# Patient Record
Sex: Female | Born: 1940
Health system: Southern US, Community
[De-identification: ages and names within clinical notes are randomized; demographics above are authoritative.]

## PROBLEM LIST (undated history)

## (undated) DIAGNOSIS — I729 Aneurysm of unspecified site: Secondary | ICD-10-CM

## (undated) DIAGNOSIS — I10 Essential (primary) hypertension: Secondary | ICD-10-CM

## (undated) DIAGNOSIS — D216 Benign neoplasm of connective and other soft tissue of trunk, unspecified: Secondary | ICD-10-CM

## (undated) DIAGNOSIS — Z1211 Encounter for screening for malignant neoplasm of colon: Secondary | ICD-10-CM

## (undated) DIAGNOSIS — L989 Disorder of the skin and subcutaneous tissue, unspecified: Secondary | ICD-10-CM

## (undated) DIAGNOSIS — J449 Chronic obstructive pulmonary disease, unspecified: Secondary | ICD-10-CM

## (undated) DIAGNOSIS — C801 Malignant (primary) neoplasm, unspecified: Secondary | ICD-10-CM

## (undated) DIAGNOSIS — E119 Type 2 diabetes mellitus without complications: Secondary | ICD-10-CM

## (undated) DIAGNOSIS — M1990 Unspecified osteoarthritis, unspecified site: Secondary | ICD-10-CM

## (undated) DIAGNOSIS — C50419 Malignant neoplasm of upper-outer quadrant of unspecified female breast: Secondary | ICD-10-CM

## (undated) DIAGNOSIS — Z860100 Personal history of colon polyps, unspecified: Secondary | ICD-10-CM

## (undated) DIAGNOSIS — E785 Hyperlipidemia, unspecified: Secondary | ICD-10-CM

## (undated) DIAGNOSIS — Z87891 Personal history of nicotine dependence: Secondary | ICD-10-CM

## (undated) HISTORY — DX: Disorder of the skin and subcutaneous tissue, unspecified: L98.9

## (undated) HISTORY — DX: Benign neoplasm of connective and other soft tissue of trunk, unspecified: D21.6

## (undated) HISTORY — DX: Aneurysm of unspecified site: I72.9

## (undated) HISTORY — DX: Personal history of nicotine dependence: Z87.891

## (undated) HISTORY — DX: Essential (primary) hypertension: I10

## (undated) HISTORY — DX: Personal history of colon polyps, unspecified: Z86.0100

## (undated) HISTORY — DX: Encounter for screening for malignant neoplasm of colon: Z12.11

## (undated) HISTORY — DX: Personal history of colonic polyps: Z86.010

## (undated) HISTORY — DX: Unspecified osteoarthritis, unspecified site: M19.90

## (undated) HISTORY — DX: Type 2 diabetes mellitus without complications: E11.9

## (undated) HISTORY — PX: EYE SURGERY: SHX253

## (undated) HISTORY — DX: Malignant neoplasm of upper-outer quadrant of unspecified female breast: C50.419

## (undated) HISTORY — DX: Hyperlipidemia, unspecified: E78.5

## (undated) HISTORY — DX: Malignant (primary) neoplasm, unspecified: C80.1

## (undated) MED FILL — Dexamethasone Sodium Phosphate Inj 100 MG/10ML: INTRAMUSCULAR | Qty: 1 | Status: AC

---

## 1989-12-25 HISTORY — PX: OTHER SURGICAL HISTORY: SHX169

## 2002-12-25 HISTORY — PX: ABDOMINAL HYSTERECTOMY: SHX81

## 2005-12-25 DIAGNOSIS — C801 Malignant (primary) neoplasm, unspecified: Secondary | ICD-10-CM

## 2005-12-25 DIAGNOSIS — I729 Aneurysm of unspecified site: Secondary | ICD-10-CM

## 2005-12-25 HISTORY — DX: Aneurysm of unspecified site: I72.9

## 2005-12-25 HISTORY — PX: MASTECTOMY: SHX3

## 2005-12-25 HISTORY — PX: PORTACATH PLACEMENT: SHX2246

## 2005-12-25 HISTORY — PX: CHOLECYSTECTOMY: SHX55

## 2005-12-25 HISTORY — DX: Malignant (primary) neoplasm, unspecified: C80.1

## 2005-12-25 HISTORY — PX: BREAST SURGERY: SHX581

## 2005-12-27 ENCOUNTER — Ambulatory Visit: Payer: Self-pay

## 2005-12-29 ENCOUNTER — Ambulatory Visit: Payer: Self-pay | Admitting: General Surgery

## 2006-01-09 ENCOUNTER — Other Ambulatory Visit: Payer: Self-pay

## 2006-01-11 ENCOUNTER — Ambulatory Visit: Payer: Self-pay | Admitting: General Surgery

## 2006-01-24 ENCOUNTER — Ambulatory Visit: Payer: Self-pay | Admitting: Internal Medicine

## 2006-01-29 ENCOUNTER — Ambulatory Visit: Payer: Self-pay | Admitting: Internal Medicine

## 2006-01-31 ENCOUNTER — Ambulatory Visit: Payer: Self-pay | Admitting: General Surgery

## 2006-02-07 ENCOUNTER — Ambulatory Visit: Payer: Self-pay | Admitting: Internal Medicine

## 2006-02-16 ENCOUNTER — Other Ambulatory Visit: Payer: Self-pay

## 2006-02-16 ENCOUNTER — Inpatient Hospital Stay: Payer: Self-pay | Admitting: General Surgery

## 2006-02-21 ENCOUNTER — Ambulatory Visit: Payer: Self-pay | Admitting: General Surgery

## 2006-02-22 ENCOUNTER — Ambulatory Visit: Payer: Self-pay | Admitting: Internal Medicine

## 2006-03-25 ENCOUNTER — Ambulatory Visit: Payer: Self-pay | Admitting: Internal Medicine

## 2006-04-24 ENCOUNTER — Ambulatory Visit: Payer: Self-pay | Admitting: Internal Medicine

## 2006-05-25 ENCOUNTER — Ambulatory Visit: Payer: Self-pay | Admitting: Internal Medicine

## 2006-06-24 ENCOUNTER — Ambulatory Visit: Payer: Self-pay | Admitting: Internal Medicine

## 2006-07-25 ENCOUNTER — Ambulatory Visit: Payer: Self-pay | Admitting: Internal Medicine

## 2006-08-25 ENCOUNTER — Ambulatory Visit: Payer: Self-pay | Admitting: Internal Medicine

## 2006-09-24 ENCOUNTER — Ambulatory Visit: Payer: Self-pay | Admitting: Internal Medicine

## 2006-10-25 ENCOUNTER — Ambulatory Visit: Payer: Self-pay | Admitting: Internal Medicine

## 2006-11-24 ENCOUNTER — Ambulatory Visit: Payer: Self-pay | Admitting: Internal Medicine

## 2006-12-25 HISTORY — PX: PORT-A-CATH REMOVAL: SHX5289

## 2006-12-27 ENCOUNTER — Ambulatory Visit: Payer: Self-pay | Admitting: Internal Medicine

## 2007-01-02 ENCOUNTER — Ambulatory Visit: Payer: Self-pay | Admitting: General Surgery

## 2007-01-25 ENCOUNTER — Ambulatory Visit: Payer: Self-pay | Admitting: Internal Medicine

## 2007-03-21 ENCOUNTER — Ambulatory Visit: Payer: Self-pay | Admitting: Internal Medicine

## 2007-03-26 ENCOUNTER — Ambulatory Visit: Payer: Self-pay | Admitting: Internal Medicine

## 2007-04-25 ENCOUNTER — Ambulatory Visit: Payer: Self-pay | Admitting: Internal Medicine

## 2007-05-26 ENCOUNTER — Ambulatory Visit: Payer: Self-pay | Admitting: Internal Medicine

## 2007-06-25 ENCOUNTER — Ambulatory Visit: Payer: Self-pay | Admitting: Internal Medicine

## 2007-07-26 ENCOUNTER — Ambulatory Visit: Payer: Self-pay | Admitting: Internal Medicine

## 2007-08-26 ENCOUNTER — Ambulatory Visit: Payer: Self-pay | Admitting: Internal Medicine

## 2007-09-25 ENCOUNTER — Ambulatory Visit: Payer: Self-pay | Admitting: Internal Medicine

## 2007-12-26 ENCOUNTER — Ambulatory Visit: Payer: Self-pay | Admitting: Internal Medicine

## 2008-01-01 ENCOUNTER — Ambulatory Visit: Payer: Self-pay | Admitting: Internal Medicine

## 2008-01-10 ENCOUNTER — Ambulatory Visit: Payer: Self-pay | Admitting: General Surgery

## 2008-01-15 ENCOUNTER — Ambulatory Visit: Payer: Self-pay | Admitting: Internal Medicine

## 2008-01-26 ENCOUNTER — Ambulatory Visit: Payer: Self-pay | Admitting: Internal Medicine

## 2008-04-24 ENCOUNTER — Ambulatory Visit: Payer: Self-pay | Admitting: Internal Medicine

## 2008-05-25 ENCOUNTER — Ambulatory Visit: Payer: Self-pay | Admitting: Internal Medicine

## 2008-12-30 ENCOUNTER — Ambulatory Visit: Payer: Self-pay | Admitting: General Surgery

## 2009-12-25 HISTORY — PX: COLONOSCOPY W/ POLYPECTOMY: SHX1380

## 2010-01-18 ENCOUNTER — Ambulatory Visit: Payer: Self-pay | Admitting: General Surgery

## 2010-02-25 ENCOUNTER — Ambulatory Visit: Payer: Self-pay | Admitting: General Surgery

## 2011-02-07 ENCOUNTER — Ambulatory Visit: Payer: Self-pay | Admitting: General Surgery

## 2012-02-13 ENCOUNTER — Ambulatory Visit: Payer: Self-pay | Admitting: General Surgery

## 2012-05-13 ENCOUNTER — Ambulatory Visit: Payer: Self-pay | Admitting: Internal Medicine

## 2012-12-25 DIAGNOSIS — D216 Benign neoplasm of connective and other soft tissue of trunk, unspecified: Secondary | ICD-10-CM

## 2012-12-25 HISTORY — DX: Benign neoplasm of connective and other soft tissue of trunk, unspecified: D21.6

## 2013-02-13 ENCOUNTER — Ambulatory Visit: Payer: Self-pay | Admitting: General Surgery

## 2013-02-19 DIAGNOSIS — L989 Disorder of the skin and subcutaneous tissue, unspecified: Secondary | ICD-10-CM

## 2013-02-19 HISTORY — DX: Disorder of the skin and subcutaneous tissue, unspecified: L98.9

## 2013-06-25 ENCOUNTER — Encounter: Payer: Self-pay | Admitting: *Deleted

## 2013-06-25 DIAGNOSIS — L989 Disorder of the skin and subcutaneous tissue, unspecified: Secondary | ICD-10-CM | POA: Insufficient documentation

## 2013-06-25 DIAGNOSIS — D216 Benign neoplasm of connective and other soft tissue of trunk, unspecified: Secondary | ICD-10-CM | POA: Insufficient documentation

## 2013-06-25 DIAGNOSIS — C801 Malignant (primary) neoplasm, unspecified: Secondary | ICD-10-CM | POA: Insufficient documentation

## 2013-10-17 DIAGNOSIS — I10 Essential (primary) hypertension: Secondary | ICD-10-CM | POA: Insufficient documentation

## 2013-12-02 DIAGNOSIS — F329 Major depressive disorder, single episode, unspecified: Secondary | ICD-10-CM | POA: Insufficient documentation

## 2013-12-25 DIAGNOSIS — C50419 Malignant neoplasm of upper-outer quadrant of unspecified female breast: Secondary | ICD-10-CM

## 2013-12-25 HISTORY — DX: Malignant neoplasm of upper-outer quadrant of unspecified female breast: C50.419

## 2014-01-01 ENCOUNTER — Ambulatory Visit (INDEPENDENT_AMBULATORY_CARE_PROVIDER_SITE_OTHER): Payer: Medicare Other | Admitting: General Surgery

## 2014-01-01 ENCOUNTER — Encounter: Payer: Self-pay | Admitting: General Surgery

## 2014-01-01 ENCOUNTER — Other Ambulatory Visit: Payer: Medicare Other

## 2014-01-01 VITALS — BP 124/68 | HR 70 | Resp 14 | Ht 66.0 in | Wt 137.0 lb

## 2014-01-01 DIAGNOSIS — R222 Localized swelling, mass and lump, trunk: Secondary | ICD-10-CM

## 2014-01-01 DIAGNOSIS — R634 Abnormal weight loss: Secondary | ICD-10-CM

## 2014-01-01 NOTE — Patient Instructions (Signed)

## 2014-01-01 NOTE — Progress Notes (Signed)
Patient ID: Jessica Compton, female   DOB: Jan 07, 1941, 73 y.o.   MRN: [id number]  Chief Complaint  Patient presents with  . Other    left mastectomy site    HPI Jessica Compton is a 73 y.o. female here today for a evaluation of a lump. Patient states she felt a lump at her left mastectomy site last week. She states there is no pain at this area.  She states she just wants to make sure everything is okay. Patient left mastectomy was done back in 2007. Patient daughter states she her mother last been loosing weight in the last four months.   HPI  Past Medical History  Diagnosis Date  . Cancer 2007    diagnosed 01/07 left breast with simple mastectomu & sn bx. The pt had a 3.5cm tumor. Frozen section report on the 2 sn were negative. On permanent sections a 0.30mm micrometastasis was identified. She was not felt to require a complete axillary dissection. She has completed her Tamoxifen therapy and has been released from routine f/u with medical oncology service.  . Diabetes mellitus without complication   . Hypertension     since age 54  . Personal history of tobacco use, presenting hazards to health   . Arthritis   . Other benign neoplasm of connective and other soft tissue of trunk, unspecified 2014  . Special screening for malignant neoplasms, colon   . Aneurysm 2007  . Hyperlipidemia   . Unspecified disorder of skin and subcutaneous tissue 02/19/2013    biopsy of skin over the sternum on the right breast showed hypertrophic scar,keloid formation.  . Malignant neoplasm of upper-outer quadrant of female breast   . Personal history of colonic polyps     Past Surgical History  Procedure Laterality Date  . Colonoscopy w/ polypectomy  2011    Dr. Brynda Greathouse  . Abdominal hysterectomy  2004    total  . Portacath placement  2007  . Port-a-cath removal  2008  . Cholecystectomy  2007  . Mastectomy Left 2007  . Head surgery  1991  . Eye surgery Right     cataract surgery  . Breast surgery  Left 2007    mastectomy    Family History  Problem Relation Age of Onset  . Cancer Other     ovarian,breast,colon cancers;relations not listed    Social History History  Substance Use Topics  . Smoking status: Current Some Day Smoker -- 1.00 packs/day for 50 years    Types: Cigarettes  . Smokeless tobacco: Never Used  . Alcohol Use: No    No Known Allergies  Current Outpatient Prescriptions  Medication Sig Dispense Refill  . citalopram (CELEXA) 10 MG tablet Take 1 tablet by mouth daily.      . hydrochlorothiazide (MICROZIDE) 12.5 MG capsule Take 1 capsule by mouth daily.      Marland Kitchen lisinopril (PRINIVIL,ZESTRIL) 5 MG tablet Take 1 tablet by mouth daily.       No current facility-administered medications for this visit.    Review of Systems Review of Systems  Constitutional: Positive for appetite change and unexpected weight change. Negative for fever, chills, diaphoresis, activity change and fatigue.  Respiratory: Negative.   Cardiovascular: Negative.   The patient denies postprandial pain or difficulty with swallowing. She reports her appetite is just not what once was. If she is cooked a meal she has no interest in eating, and even when visiting with family she'll eat a few bites and then be satisfied.  Blood pressure 124/68, pulse 70, resp. rate 14, height 5\' 6"  (1.676 m), weight 137 lb (62.143 kg). The patient's weight is down 14 pounds from her February 2014 exam.  Physical Exam Physical Exam  Constitutional: She is oriented to person, place, and time. She appears well-developed and well-nourished.  Eyes: Conjunctivae are normal. No scleral icterus.  Neck: Neck supple.  Cardiovascular: Normal rate, regular rhythm and normal heart sounds.   Pulmonary/Chest: Breath sounds normal.  Left mastectomy site shows a well healed incision. Hypertrophic scaring at port site on the right medial chest wall.  Left chest wall mass: 7 cm below the clavicle in the midclavicular line  measuring 1.5 cm.  Lymphadenopathy:       Left cervical: No superficial cervical adenopathy present.    She has no axillary adenopathy.  Neurological: She is alert and oriented to person, place, and time.  Skin: Skin is warm and dry.    Data Reviewed Ultrasound examination of the mass below the clavicle on the left chest wall shows a 1.1 x 1.4 x 1.7 cm hypoechoic mass with focal acoustic shadowing beginning at the dermis and extending down to the pectoralis fascia. Increased vascular flow is appreciated on duplex imaging.  The patient was amenable to core biopsy. The area was cleansed with alcohol and 10 cc of 0.5% Xylocaine with 0.25% Marcaine with 1-200,000 units of epinephrine was utilized a well tolerated. Prep was applied to the skin. Multiple core samples were obtained from 2 locations making use of a 14-gauge Finesse device. The skin defect was closed with benzoin and Steri-Strips followed by Telfa and Tegaderm dressing.  Assessment    Left chest wall mass, suspicious for local recurrence of her previous breast malignancy. Weight loss, possible cachexia of malignancy    Plan    The patient and her daughter will be contacted by phone when pathology is available.       [name] 01/02/2014, 1:55 PM

## 2014-01-02 DIAGNOSIS — R222 Localized swelling, mass and lump, trunk: Secondary | ICD-10-CM | POA: Insufficient documentation

## 2014-01-02 DIAGNOSIS — R634 Abnormal weight loss: Secondary | ICD-10-CM | POA: Insufficient documentation

## 2014-01-05 ENCOUNTER — Telehealth: Payer: Self-pay | Admitting: General Surgery

## 2014-01-05 ENCOUNTER — Telehealth: Payer: Self-pay | Admitting: *Deleted

## 2014-01-05 ENCOUNTER — Ambulatory Visit: Payer: Self-pay | Admitting: Internal Medicine

## 2014-01-05 DIAGNOSIS — Z853 Personal history of malignant neoplasm of breast: Secondary | ICD-10-CM

## 2014-01-05 DIAGNOSIS — C50919 Malignant neoplasm of unspecified site of unspecified female breast: Secondary | ICD-10-CM

## 2014-01-05 NOTE — Telephone Encounter (Signed)
With both the patient and her daughter [name] to inform them that the biopsy completed on 01/01/2014 did show evidence of breast cancer.  Will make arrangements for a PET scan and laboratory studies, and she will be added to the [facility] tumor board scheduled for January 15.

## 2014-01-05 NOTE — Telephone Encounter (Signed)
Message copied by Dominga Ferry on Mon Jan 05, 2014  4:23 PM ------      Message from: [place], [place] W      Created: Mon Jan 05, 2014  2:49 PM       Please arrange for a PET/CT (DX: Recurrent breast cancer), CBC, MEt C, CEA and Ca 27-29.            Patient needs an appt w/ Dr. Ma Hillock for the week of Jan 19th. Thanks. ------

## 2014-01-05 NOTE — Telephone Encounter (Signed)
Patient has been scheduled for a PET scan at [facility] for 01-08-14 at 12:30 pm (arrive 11:45 am). Daughter is aware of date, time, and prep instructions.  This patient has also been asked to have labs drawn while she is at [facility] for PET scan. This is to minimize an extra trip since she will already be there. Order sent through Order Facilitator. Daughter aware of instructions.  Patient has been scheduled for an appointment with Dr. Ma Hillock at the [facility] for 01-14-14 at 2:45 pm (she will not need to arrive any earlier per Tia). Daughter aware.  Patient's daughter, Helene Kelp, to call the office if she has further questions.

## 2014-01-08 ENCOUNTER — Ambulatory Visit: Payer: Self-pay | Admitting: General Surgery

## 2014-01-08 LAB — COMPREHENSIVE METABOLIC PANEL
ALBUMIN: 4 g/dL (ref 3.4–5.0)
ALT: 19 U/L (ref 12–78)
AST: 28 U/L (ref 15–37)
Alkaline Phosphatase: 61 U/L
Anion Gap: 0 — ABNORMAL LOW (ref 7–16)
BILIRUBIN TOTAL: 0.5 mg/dL (ref 0.2–1.0)
BUN: 15 mg/dL (ref 7–18)
CALCIUM: 9.6 mg/dL (ref 8.5–10.1)
CO2: 29 mmol/L (ref 21–32)
Chloride: 103 mmol/L (ref 98–107)
Creatinine: 1.12 mg/dL (ref 0.60–1.30)
EGFR (Non-African Amer.): 49 — ABNORMAL LOW
GFR CALC AF AMER: 57 — AB
Glucose: 89 mg/dL (ref 65–99)
Osmolality: 265 (ref 275–301)
Potassium: 3.5 mmol/L (ref 3.5–5.1)
Sodium: 132 mmol/L — ABNORMAL LOW (ref 136–145)
Total Protein: 8.3 g/dL — ABNORMAL HIGH (ref 6.4–8.2)

## 2014-01-08 LAB — CBC WITH DIFFERENTIAL/PLATELET
BASOS ABS: 0 10*3/uL (ref 0.0–0.1)
Basophil %: 0.4 %
EOS ABS: 0.1 10*3/uL (ref 0.0–0.7)
Eosinophil %: 1.3 %
HCT: 38.5 % (ref 35.0–47.0)
HGB: 12.7 g/dL (ref 12.0–16.0)
LYMPHS ABS: 3.2 10*3/uL (ref 1.0–3.6)
Lymphocyte %: 34.5 %
MCH: 28.4 pg (ref 26.0–34.0)
MCHC: 33 g/dL (ref 32.0–36.0)
MCV: 86 fL (ref 80–100)
Monocyte #: 0.8 x10 3/mm (ref 0.2–0.9)
Monocyte %: 8.1 %
NEUTROS ABS: 5.2 10*3/uL (ref 1.4–6.5)
Neutrophil %: 55.7 %
Platelet: 237 10*3/uL (ref 150–440)
RBC: 4.47 10*6/uL (ref 3.80–5.20)
RDW: 14.1 % (ref 11.5–14.5)
WBC: 9.3 10*3/uL (ref 3.6–11.0)

## 2014-01-09 ENCOUNTER — Encounter: Payer: Self-pay | Admitting: General Surgery

## 2014-01-09 LAB — PATHOLOGY

## 2014-01-09 LAB — CANCER ANTIGEN 27.29: CA 27.29: 14.3 U/mL (ref 0.0–38.6)

## 2014-01-09 LAB — CEA: CEA: 5.3 ng/mL — ABNORMAL HIGH (ref 0.0–4.7)

## 2014-01-13 ENCOUNTER — Telehealth: Payer: Self-pay | Admitting: General Surgery

## 2014-01-13 NOTE — Telephone Encounter (Signed)
The patient was notified that her CA 27-29 was normal.  I spoke with the radiologist regarding the diffuse increased uptake in the right breast tissue. He did not think this represented a pathologic process.  The left chest wall mass was brightly intense consistent with the biopsy-proven malignancy.  The patient is scheduled for followup on January 21 with Leia Alf, M.D. She's likely candidate for resection of this area on the left chest wall followed by radiation therapy.  Follow up will be completed after her upcoming medical oncology assessment.

## 2014-01-14 ENCOUNTER — Telehealth: Payer: Self-pay | Admitting: *Deleted

## 2014-01-14 ENCOUNTER — Ambulatory Visit: Payer: Self-pay | Admitting: Internal Medicine

## 2014-01-14 ENCOUNTER — Encounter: Payer: Self-pay | Admitting: General Surgery

## 2014-01-14 NOTE — Telephone Encounter (Signed)
Per Judeen Hammans at the [facility], Dr. Ma Hillock agrees that patient needs to have surgery first and then see Dr. Baruch Gouty after (patient would see Dr. Ma Hillock and Dr. Baruch Gouty at the same time). Patient would need 6 weeks to allow for healing. All of this is based upon assuming treatment plan will not change unless pathology from upcoming surgery determines differently.

## 2014-01-15 ENCOUNTER — Encounter: Payer: Self-pay | Admitting: General Surgery

## 2014-01-15 ENCOUNTER — Telehealth: Payer: Self-pay | Admitting: *Deleted

## 2014-01-15 NOTE — Telephone Encounter (Signed)
Patient's surgery has been scheduled for 01-26-14 at [facility]. This patient will come in for a pre-op visit on 01-19-14. Paperwork will be reviewed with patient at that time.

## 2014-01-19 ENCOUNTER — Encounter: Payer: Self-pay | Admitting: General Surgery

## 2014-01-19 ENCOUNTER — Ambulatory Visit (INDEPENDENT_AMBULATORY_CARE_PROVIDER_SITE_OTHER): Payer: Medicare Other | Admitting: General Surgery

## 2014-01-19 ENCOUNTER — Other Ambulatory Visit: Payer: Self-pay | Admitting: General Surgery

## 2014-01-19 VITALS — BP 130/80 | HR 68 | Resp 12 | Ht 64.0 in | Wt 136.0 lb

## 2014-01-19 DIAGNOSIS — C50919 Malignant neoplasm of unspecified site of unspecified female breast: Secondary | ICD-10-CM

## 2014-01-19 DIAGNOSIS — R948 Abnormal results of function studies of other organs and systems: Secondary | ICD-10-CM

## 2014-01-19 DIAGNOSIS — R222 Localized swelling, mass and lump, trunk: Secondary | ICD-10-CM

## 2014-01-19 DIAGNOSIS — Z853 Personal history of malignant neoplasm of breast: Secondary | ICD-10-CM

## 2014-01-19 NOTE — Progress Notes (Signed)
Patient ID: Jessica Compton, female   DOB: 12-Oct-1941, 73 y.o.   MRN: [id number]  Chief Complaint  Patient presents with  . Pre-op Exam    wide excision and right breast chest wall mass    HPI Jessica Compton is a 73 y.o. female here today for her pre op wide excision left chest and right breast core biopsy.Patient states she is doing well. The patient is accompanied by her daughters. HPI  Past Medical History  Diagnosis Date  . Cancer 2007    diagnosed 01/07 left breast with simple mastectomu & sn bx. The pt had a 3.5cm tumor. Frozen section report on the 2 sn were negative. On permanent sections a 0.63mm micrometastasis was identified. She was not felt to require a complete axillary dissection. She has completed her Tamoxifen therapy and has been released from routine f/u with medical oncology service.  . Diabetes mellitus without complication   . Hypertension     since age 56  . Personal history of tobacco use, presenting hazards to health   . Arthritis   . Other benign neoplasm of connective and other soft tissue of trunk, unspecified 2014  . Special screening for malignant neoplasms, colon   . Aneurysm 2007  . Hyperlipidemia   . Unspecified disorder of skin and subcutaneous tissue 02/19/2013    biopsy of skin over the sternum on the right breast showed hypertrophic scar,keloid formation.  . Malignant neoplasm of upper-outer quadrant of female breast   . Personal history of colonic polyps     Past Surgical History  Procedure Laterality Date  . Colonoscopy w/ polypectomy  2011    Dr. Brynda Greathouse  . Abdominal hysterectomy  2004    total  . Portacath placement  2007  . Port-a-cath removal  2008  . Cholecystectomy  2007  . Mastectomy Left 2007  . Head surgery  1991  . Eye surgery Right     cataract surgery  . Breast surgery Left 2007    mastectomy    Family History  Problem Relation Age of Onset  . Cancer Other     ovarian,breast,colon cancers;relations not listed    Social  History History  Substance Use Topics  . Smoking status: Current Some Day Smoker -- 1.00 packs/day for 50 years    Types: Cigarettes  . Smokeless tobacco: Never Used  . Alcohol Use: No    No Known Allergies  Current Outpatient Prescriptions  Medication Sig Dispense Refill  . citalopram (CELEXA) 10 MG tablet Take 1 tablet by mouth daily.      . hydrochlorothiazide (MICROZIDE) 12.5 MG capsule Take 1 capsule by mouth daily.      Marland Kitchen lisinopril (PRINIVIL,ZESTRIL) 5 MG tablet Take 1 tablet by mouth daily.       No current facility-administered medications for this visit.    Review of Systems Review of Systems  Constitutional: Negative.   Respiratory: Negative.   Cardiovascular: Negative.     Blood pressure 130/80, pulse 68, resp. rate 12, height 5\' 4"  (1.626 m), weight 136 lb (61.689 kg).  Physical Exam Physical Exam  Nursing note reviewed. Constitutional: She is oriented to person, place, and time. She appears well-developed and well-nourished.  Eyes: Conjunctivae are normal.  Neck: Neck supple.  Cardiovascular: Normal rate, regular rhythm and normal heart sounds.   Pulmonary/Chest: Effort normal and breath sounds normal. Right breast exhibits no inverted nipple, no mass, no nipple discharge, no skin change and no tenderness. Left breast exhibits no inverted nipple, no  mass, no nipple discharge, no skin change and no tenderness.  Left chest wall shows a 3 cm mass.  Lymphadenopathy:    She has no cervical adenopathy.    She has no axillary adenopathy.  Neurological: She is alert and oriented to person, place, and time.  Skin: Skin is warm and dry.    Data Reviewed PET scan showed diffuse uptake throughout the right breast as well as a hypermetabolic area at the site of note recurrence the left chest wall. The images were discussed with the attending radiologist who attributed the deep use right breast uptake 2 physiologic activity.  Mammograms from 2014 had previously been  reviewed and showed no discernible abnormality.  Assessment    Chest wall recurrence, left. Abnormal uptake on PET scan of the right breast.     Plan    Plans are for excision of the left chest wall recurrence as well as core biopsy of the right breast to confirm no secondary malignancy. The patient is aware that postoperative radiation therapy is planned.     Patient's surgery has been scheduled for 01-26-13 at [facility].    [name] 01/19/2014, 7:23 PM

## 2014-01-19 NOTE — Patient Instructions (Signed)
Patient's surgery has been scheduled for 01-26-13 at [facility].

## 2014-01-20 ENCOUNTER — Ambulatory Visit: Payer: Self-pay | Admitting: General Surgery

## 2014-01-25 ENCOUNTER — Ambulatory Visit: Payer: Self-pay | Admitting: Internal Medicine

## 2014-01-26 ENCOUNTER — Ambulatory Visit: Payer: Self-pay | Admitting: General Surgery

## 2014-01-26 ENCOUNTER — Encounter: Payer: Self-pay | Admitting: General Surgery

## 2014-01-26 DIAGNOSIS — Z853 Personal history of malignant neoplasm of breast: Secondary | ICD-10-CM

## 2014-01-26 DIAGNOSIS — N63 Unspecified lump in unspecified breast: Secondary | ICD-10-CM

## 2014-01-26 DIAGNOSIS — C7981 Secondary malignant neoplasm of breast: Secondary | ICD-10-CM

## 2014-01-26 HISTORY — PX: BREAST BIOPSY: SHX20

## 2014-01-26 HISTORY — PX: OTHER SURGICAL HISTORY: SHX169

## 2014-01-28 ENCOUNTER — Telehealth: Payer: Self-pay | Admitting: *Deleted

## 2014-01-28 ENCOUNTER — Encounter: Payer: Self-pay | Admitting: General Surgery

## 2014-01-28 LAB — PATHOLOGY REPORT

## 2014-01-28 NOTE — Telephone Encounter (Signed)
Message copied by Carson Myrtle on Wed Jan 28, 2014  8:13 AM ------      Message from: [place], [place] Gleason      Created: Wed Jan 28, 2014  6:27 AM       Please notify the patient right breast biopsy was fine.  Left chest mass completely removed. F/U as scheduled.  ------

## 2014-01-28 NOTE — Telephone Encounter (Signed)
Notified patient as instructed, patient pleased. Discussed follow-up appointments, patient agrees  

## 2014-01-29 ENCOUNTER — Encounter: Payer: Self-pay | Admitting: General Surgery

## 2014-02-04 ENCOUNTER — Ambulatory Visit (INDEPENDENT_AMBULATORY_CARE_PROVIDER_SITE_OTHER): Payer: Medicare Other | Admitting: General Surgery

## 2014-02-04 ENCOUNTER — Encounter: Payer: Self-pay | Admitting: General Surgery

## 2014-02-04 VITALS — BP 142/80 | HR 78 | Resp 12 | Ht 66.0 in | Wt 137.0 lb

## 2014-02-04 DIAGNOSIS — C50919 Malignant neoplasm of unspecified site of unspecified female breast: Secondary | ICD-10-CM

## 2014-02-04 DIAGNOSIS — C792 Secondary malignant neoplasm of skin: Secondary | ICD-10-CM

## 2014-02-04 DIAGNOSIS — R222 Localized swelling, mass and lump, trunk: Secondary | ICD-10-CM

## 2014-02-04 NOTE — Patient Instructions (Addendum)
Patient to return in two month.  

## 2014-02-04 NOTE — Progress Notes (Signed)
Patient ID: Jessica Compton, female   DOB: [birth year], 73 y.o.   MRN: [id number]  Chief Complaint  Patient presents with  . Routine Post Op    left chest wall mass removal and right breast biopsy    HPI Jessica Compton is a 73 y.o. female. here today for hewr post op left chest wall mass removal and right breast biopsy 23/2/15. The patient underwent resection of the chest wall recurrence to negative margins as well as core biopsy of the contralateral breast due to increased uptake on PET scanning.    HPI  Past Medical History  Diagnosis Date  . Cancer 2007    diagnosed 01/07 left breast with simple mastectomu & sn bx. The pt had a 3.5cm tumor. Frozen section report on the 2 sn were negative. On permanent sections a 0.61mm micrometastasis was identified. She was not felt to require a complete axillary dissection. She has completed her Tamoxifen therapy and has been released from routine f/u with medical oncology service.  . Diabetes mellitus without complication   . Hypertension     since age 36  . Personal history of tobacco use, presenting hazards to health   . Arthritis   . Other benign neoplasm of connective and other soft tissue of trunk, unspecified 2014  . Special screening for malignant neoplasms, colon   . Aneurysm 2007  . Hyperlipidemia   . Unspecified disorder of skin and subcutaneous tissue 02/19/2013    biopsy of skin over the sternum on the right breast showed hypertrophic scar,keloid formation.  . Malignant neoplasm of upper-outer quadrant of female breast   . Personal history of colonic polyps     Past Surgical History  Procedure Laterality Date  . Colonoscopy w/ polypectomy  2011    Dr. Brynda Greathouse  . Abdominal hysterectomy  2004    total  . Portacath placement  2007  . Port-a-cath removal  2008  . Cholecystectomy  2007  . Mastectomy Left 2007  . Head surgery  1991  . Eye surgery Right     cataract surgery  . Breast surgery Left 2007    mastectomy  . Breast biopsy  Right     2015  . Chest wall mass  01/26/14    Family History  Problem Relation Age of Onset  . Cancer Other     ovarian,breast,colon cancers;relations not listed    Social History History  Substance Use Topics  . Smoking status: Current Some Day Smoker -- 1.00 packs/day for 50 years    Types: Cigarettes  . Smokeless tobacco: Never Used  . Alcohol Use: No    No Known Allergies  Current Outpatient Prescriptions  Medication Sig Dispense Refill  . citalopram (CELEXA) 10 MG tablet Take 1 tablet by mouth daily.      . hydrochlorothiazide (MICROZIDE) 12.5 MG capsule Take 1 capsule by mouth daily.      Marland Kitchen lisinopril (PRINIVIL,ZESTRIL) 5 MG tablet Take 1 tablet by mouth daily.       No current facility-administered medications for this visit.    Review of Systems Review of Systems  Constitutional: Negative.   Respiratory: Negative.   Cardiovascular: Negative.     Blood pressure 142/80, pulse 78, resp. rate 12, height 5\' 6"  (1.676 m), weight 137 lb (62.143 kg).  Physical Exam Physical Exam  Constitutional: She is oriented to person, place, and time. She appears well-developed and well-nourished.  Eyes: Conjunctivae are normal.  Neck: Neck supple.  Cardiovascular: Normal rate, regular rhythm and  normal heart sounds.   Pulmonary/Chest: Breath sounds normal.    Right breast biopsy looks clean and well healed.   The excision site in the left superior chest wall has healed nicely without evidence of fluid accumulation or erythema.  Neurological: She is alert and oriented to person, place, and time.  Skin: Skin is warm and dry.    Data Reviewed Right breast biopsy showed ductal hyperplasia without atypia or malignancy.  Left chest wall mass excision showed invasive mammary carcinoma with negative margins.  Assessment    The patient is doing well status post resection of the chest wall recurrence.    Plan    Right breast mammogram scheduled for 02/17/2014.  The  patient has an appointment with medical oncology and radiation oncology on 02/25/2014.  We'll plan for her to return here in 2 months.       [name] 02/05/2014, 3:11 PM

## 2014-02-05 DIAGNOSIS — C50919 Malignant neoplasm of unspecified site of unspecified female breast: Secondary | ICD-10-CM | POA: Insufficient documentation

## 2014-02-05 DIAGNOSIS — C792 Secondary malignant neoplasm of skin: Secondary | ICD-10-CM

## 2014-02-17 LAB — HER-2 / NEU, FISH
AVG NUM HER-2 SIGNALS/NUCLEUS: 2.1
Avg Num CEP17 probes/nucleus:: 1.9
HER-2/CEP17 RATIO: 1.09
NUMBER OF OBSERVERS: 2
Number of Tumor Cells Counted:: 40

## 2014-02-17 LAB — ER/PR,IMMUNOHISTOCHEM,PARAFFIN: PROGESTERONE RECP IP: 0 %

## 2014-02-24 ENCOUNTER — Ambulatory Visit: Payer: Self-pay | Admitting: Internal Medicine

## 2014-02-25 ENCOUNTER — Ambulatory Visit: Payer: Self-pay | Admitting: General Surgery

## 2014-03-25 ENCOUNTER — Ambulatory Visit: Payer: Self-pay | Admitting: Internal Medicine

## 2014-03-25 LAB — CBC CANCER CENTER
BASOS PCT: 0.5 %
Basophil #: 0 x10 3/mm (ref 0.0–0.1)
EOS PCT: 3 %
Eosinophil #: 0.2 x10 3/mm (ref 0.0–0.7)
HCT: 36.7 % (ref 35.0–47.0)
HGB: 11.7 g/dL — ABNORMAL LOW (ref 12.0–16.0)
LYMPHS ABS: 1.8 x10 3/mm (ref 1.0–3.6)
Lymphocyte %: 29.3 %
MCH: 28.4 pg (ref 26.0–34.0)
MCHC: 31.9 g/dL — ABNORMAL LOW (ref 32.0–36.0)
MCV: 89 fL (ref 80–100)
MONO ABS: 0.5 x10 3/mm (ref 0.2–0.9)
Monocyte %: 8.3 %
NEUTROS ABS: 3.5 x10 3/mm (ref 1.4–6.5)
Neutrophil %: 58.9 %
PLATELETS: 233 x10 3/mm (ref 150–440)
RBC: 4.12 10*6/uL (ref 3.80–5.20)
RDW: 14 % (ref 11.5–14.5)
WBC: 6 x10 3/mm (ref 3.6–11.0)

## 2014-04-01 LAB — CBC CANCER CENTER
Basophil #: 0 x10 3/mm (ref 0.0–0.1)
Basophil %: 0.4 %
EOS PCT: 1.7 %
Eosinophil #: 0.1 x10 3/mm (ref 0.0–0.7)
HCT: 36.1 % (ref 35.0–47.0)
HGB: 11.4 g/dL — ABNORMAL LOW (ref 12.0–16.0)
LYMPHS PCT: 25.1 %
Lymphocyte #: 1.5 x10 3/mm (ref 1.0–3.6)
MCH: 28.3 pg (ref 26.0–34.0)
MCHC: 31.5 g/dL — AB (ref 32.0–36.0)
MCV: 90 fL (ref 80–100)
Monocyte #: 0.6 x10 3/mm (ref 0.2–0.9)
Monocyte %: 10 %
NEUTROS ABS: 3.8 x10 3/mm (ref 1.4–6.5)
Neutrophil %: 62.8 %
PLATELETS: 222 x10 3/mm (ref 150–440)
RBC: 4.03 10*6/uL (ref 3.80–5.20)
RDW: 13.7 % (ref 11.5–14.5)
WBC: 6.1 x10 3/mm (ref 3.6–11.0)

## 2014-04-07 ENCOUNTER — Encounter: Payer: Self-pay | Admitting: General Surgery

## 2014-04-07 ENCOUNTER — Ambulatory Visit (INDEPENDENT_AMBULATORY_CARE_PROVIDER_SITE_OTHER): Payer: Medicare Other | Admitting: General Surgery

## 2014-04-07 VITALS — BP 130/70 | HR 62 | Resp 12 | Ht 66.0 in | Wt 140.0 lb

## 2014-04-07 DIAGNOSIS — R222 Localized swelling, mass and lump, trunk: Secondary | ICD-10-CM

## 2014-04-07 DIAGNOSIS — C50919 Malignant neoplasm of unspecified site of unspecified female breast: Secondary | ICD-10-CM

## 2014-04-07 DIAGNOSIS — C792 Secondary malignant neoplasm of skin: Secondary | ICD-10-CM

## 2014-04-07 NOTE — Patient Instructions (Signed)
Patient to return in three months.  

## 2014-04-07 NOTE — Progress Notes (Signed)
Patient ID: Jessica Compton, female   DOB: 07/07/1941, 73 y.o.   MRN: [id number]  Chief Complaint  Patient presents with  . Follow-up    2 month follow up breast cancer no mammogram    HPI Jessica Compton is a 73 y.o. female who presents for a 2 month resection of a metastatic foci of breast cancer on the left mastectomy site. At the same time core biopsy of the right breast was completed which was benign.. No mammogram done at this time. The patient is currently undergoing radiation therapy. She denies any new problems at this time.   HPI  Past Medical History  Diagnosis Date  . Diabetes mellitus without complication   . Hypertension     since age 77  . Personal history of tobacco use, presenting hazards to health   . Arthritis   . Other benign neoplasm of connective and other soft tissue of trunk, unspecified 2014  . Special screening for malignant neoplasms, colon   . Aneurysm 2007  . Hyperlipidemia   . Unspecified disorder of skin and subcutaneous tissue 02/19/2013    biopsy of skin over the sternum on the right breast showed hypertrophic scar,keloid formation.  . Personal history of colonic polyps   . Cancer 2007    diagnosed 01/07 left breast with simple mastectomu & sn bx. The pt had a 3.5cm tumor. Frozen section report on the 2 sn were negative. On permanent sections a 0.54m micrometastasis was identified. She was not felt to require a complete axillary dissection. She has completed her Tamoxifen therapy and has been released from routine f/u with medical oncology service.  . Malignant neoplasm of upper-outer quadrant of female breast January 2015    Mastectomy site recurrence resected 01/26/2014, ER 90%, PR 0%, HER-2/neu nonamplified.    Past Surgical History  Procedure Laterality Date  . Colonoscopy w/ polypectomy  2011    Dr. EBrynda Greathouse . Abdominal hysterectomy  2004    total  . Portacath placement  2007  . Port-a-cath removal  2008  . Cholecystectomy  2007  . Mastectomy  Left 2007  . Head surgery  1991  . Eye surgery Right     cataract surgery  . Breast surgery Left 2007    mastectomy  . Breast biopsy Right     2015  . Chest wall mass  01/26/14    Family History  Problem Relation Age of Onset  . Cancer Other     ovarian,breast,colon cancers;relations not listed    Social History History  Substance Use Topics  . Smoking status: Current Some Day Smoker -- 1.00 packs/day for 50 years    Types: Cigarettes  . Smokeless tobacco: Never Used  . Alcohol Use: No    No Known Allergies  Current Outpatient Prescriptions  Medication Sig Dispense Refill  . citalopram (CELEXA) 10 MG tablet Take 1 tablet by mouth daily.      . hydrochlorothiazide (MICROZIDE) 12.5 MG capsule Take 1 capsule by mouth daily.      .Marland Kitchenlisinopril (PRINIVIL,ZESTRIL) 5 MG tablet Take 1 tablet by mouth daily.       No current facility-administered medications for this visit.    Review of Systems Review of Systems  Constitutional: Negative.   Respiratory: Negative.   Cardiovascular: Negative.     Blood pressure 130/70, pulse 62, resp. rate 12, height _0  (1.676 m), weight 140 lb (63.504 kg).  Physical Exam Physical Exam  Constitutional: She is oriented to person, place, and  time. She appears well-developed and well-nourished.  Eyes: Conjunctivae are normal.  Neck: Neck supple.  Cardiovascular: Normal rate, regular rhythm and normal heart sounds.   Pulmonary/Chest: Effort normal and breath sounds normal.    Well healed left mastectomy site.   Abdominal: She exhibits mass.  Neurological: She is alert and oriented to person, place, and time.  Skin: Skin is warm and dry.     Assessment    Doing well status post chest wall recurrence.    Plan    The patient has completed 10 of her radiation treatments. We'll plan for followup exam in 4 months.    PCP: Tor Netters MD     [place] Gle[place] Master Touchet 04/07/2014, 3:19 PM

## 2014-04-08 LAB — CBC CANCER CENTER
Basophil #: 0 x10 3/mm (ref 0.0–0.1)
Basophil %: 0.5 %
EOS ABS: 0.2 x10 3/mm (ref 0.0–0.7)
Eosinophil %: 2.6 %
HCT: 36 % (ref 35.0–47.0)
HGB: 11.4 g/dL — ABNORMAL LOW (ref 12.0–16.0)
LYMPHS PCT: 22.9 %
Lymphocyte #: 1.3 x10 3/mm (ref 1.0–3.6)
MCH: 28.4 pg (ref 26.0–34.0)
MCHC: 31.8 g/dL — ABNORMAL LOW (ref 32.0–36.0)
MCV: 89 fL (ref 80–100)
MONOS PCT: 9.2 %
Monocyte #: 0.5 x10 3/mm (ref 0.2–0.9)
Neutrophil #: 3.8 x10 3/mm (ref 1.4–6.5)
Neutrophil %: 64.8 %
Platelet: 228 x10 3/mm (ref 150–440)
RBC: 4.03 10*6/uL (ref 3.80–5.20)
RDW: 13.7 % (ref 11.5–14.5)
WBC: 5.9 x10 3/mm (ref 3.6–11.0)

## 2014-04-15 LAB — CBC CANCER CENTER
BASOS ABS: 0 x10 3/mm (ref 0.0–0.1)
BASOS PCT: 0.5 %
Eosinophil #: 0.2 x10 3/mm (ref 0.0–0.7)
Eosinophil %: 3.1 %
HCT: 34.9 % — ABNORMAL LOW (ref 35.0–47.0)
HGB: 11.1 g/dL — AB (ref 12.0–16.0)
LYMPHS ABS: 1.1 x10 3/mm (ref 1.0–3.6)
Lymphocyte %: 20.9 %
MCH: 28.3 pg (ref 26.0–34.0)
MCHC: 31.8 g/dL — AB (ref 32.0–36.0)
MCV: 89 fL (ref 80–100)
MONO ABS: 0.6 x10 3/mm (ref 0.2–0.9)
Monocyte %: 10.8 %
Neutrophil #: 3.3 x10 3/mm (ref 1.4–6.5)
Neutrophil %: 64.7 %
PLATELETS: 201 x10 3/mm (ref 150–440)
RBC: 3.91 10*6/uL (ref 3.80–5.20)
RDW: 13.8 % (ref 11.5–14.5)
WBC: 5.1 x10 3/mm (ref 3.6–11.0)

## 2014-04-21 LAB — HEPATIC FUNCTION PANEL A (ARMC)
ALK PHOS: 62 U/L
Albumin: 3.8 g/dL (ref 3.4–5.0)
BILIRUBIN TOTAL: 0.6 mg/dL (ref 0.2–1.0)
Bilirubin, Direct: 0.1 mg/dL (ref 0.00–0.20)
SGOT(AST): 17 U/L (ref 15–37)
SGPT (ALT): 23 U/L (ref 12–78)
Total Protein: 7.9 g/dL (ref 6.4–8.2)

## 2014-04-21 LAB — CBC CANCER CENTER
BASOS ABS: 0.1 x10 3/mm (ref 0.0–0.1)
Basophil %: 1.1 %
EOS ABS: 0.1 x10 3/mm (ref 0.0–0.7)
Eosinophil %: 2.3 %
HCT: 37 % (ref 35.0–47.0)
HGB: 11.9 g/dL — ABNORMAL LOW (ref 12.0–16.0)
LYMPHS ABS: 1.3 x10 3/mm (ref 1.0–3.6)
Lymphocyte %: 22.5 %
MCH: 28.4 pg (ref 26.0–34.0)
MCHC: 32.2 g/dL (ref 32.0–36.0)
MCV: 88 fL (ref 80–100)
MONO ABS: 0.6 x10 3/mm (ref 0.2–0.9)
Monocyte %: 9.9 %
Neutrophil #: 3.7 x10 3/mm (ref 1.4–6.5)
Neutrophil %: 64.2 %
PLATELETS: 217 x10 3/mm (ref 150–440)
RBC: 4.2 10*6/uL (ref 3.80–5.20)
RDW: 13.7 % (ref 11.5–14.5)
WBC: 5.7 x10 3/mm (ref 3.6–11.0)

## 2014-04-21 LAB — CREATININE, SERUM
Creatinine: 1.08 mg/dL (ref 0.60–1.30)
EGFR (African American): 60 — ABNORMAL LOW
EGFR (Non-African Amer.): 52 — ABNORMAL LOW

## 2014-04-24 ENCOUNTER — Ambulatory Visit: Payer: Self-pay | Admitting: Internal Medicine

## 2014-04-29 LAB — CBC CANCER CENTER
BASOS ABS: 0 x10 3/mm (ref 0.0–0.1)
BASOS PCT: 0.5 %
EOS ABS: 0.1 x10 3/mm (ref 0.0–0.7)
EOS PCT: 1.5 %
HCT: 34.2 % — AB (ref 35.0–47.0)
HGB: 11.1 g/dL — ABNORMAL LOW (ref 12.0–16.0)
LYMPHS PCT: 17.4 %
Lymphocyte #: 0.9 x10 3/mm — ABNORMAL LOW (ref 1.0–3.6)
MCH: 29.1 pg (ref 26.0–34.0)
MCHC: 32.6 g/dL (ref 32.0–36.0)
MCV: 89 fL (ref 80–100)
MONO ABS: 0.5 x10 3/mm (ref 0.2–0.9)
MONOS PCT: 10.5 %
NEUTROS PCT: 70.1 %
Neutrophil #: 3.6 x10 3/mm (ref 1.4–6.5)
PLATELETS: 200 x10 3/mm (ref 150–440)
RBC: 3.82 10*6/uL (ref 3.80–5.20)
RDW: 13.9 % (ref 11.5–14.5)
WBC: 5.2 x10 3/mm (ref 3.6–11.0)

## 2014-05-08 LAB — CBC CANCER CENTER
Basophil #: 0 x10 3/mm (ref 0.0–0.1)
Basophil %: 0.6 %
Eosinophil #: 0.1 x10 3/mm (ref 0.0–0.7)
Eosinophil %: 1.2 %
HCT: 35.3 % (ref 35.0–47.0)
HGB: 11.7 g/dL — AB (ref 12.0–16.0)
LYMPHS PCT: 15.6 %
Lymphocyte #: 0.7 x10 3/mm — ABNORMAL LOW (ref 1.0–3.6)
MCH: 29.1 pg (ref 26.0–34.0)
MCHC: 33.3 g/dL (ref 32.0–36.0)
MCV: 87 fL (ref 80–100)
Monocyte #: 0.5 x10 3/mm (ref 0.2–0.9)
Monocyte %: 10.8 %
NEUTROS ABS: 3.2 x10 3/mm (ref 1.4–6.5)
Neutrophil %: 71.8 %
PLATELETS: 215 x10 3/mm (ref 150–440)
RBC: 4.04 10*6/uL (ref 3.80–5.20)
RDW: 13.1 % (ref 11.5–14.5)
WBC: 4.5 x10 3/mm (ref 3.6–11.0)

## 2014-05-08 LAB — CREATININE, SERUM
Creatinine: 1.1 mg/dL (ref 0.60–1.30)
EGFR (African American): 58 — ABNORMAL LOW
EGFR (Non-African Amer.): 50 — ABNORMAL LOW

## 2014-05-08 LAB — HEPATIC FUNCTION PANEL A (ARMC)
AST: 20 U/L (ref 15–37)
Albumin: 3.7 g/dL (ref 3.4–5.0)
Alkaline Phosphatase: 58 U/L
BILIRUBIN DIRECT: 0.1 mg/dL (ref 0.00–0.20)
Bilirubin,Total: 0.4 mg/dL (ref 0.2–1.0)
SGPT (ALT): 18 U/L (ref 12–78)
TOTAL PROTEIN: 7.8 g/dL (ref 6.4–8.2)

## 2014-05-25 ENCOUNTER — Ambulatory Visit: Payer: Self-pay | Admitting: Internal Medicine

## 2014-05-30 ENCOUNTER — Inpatient Hospital Stay: Payer: Self-pay | Admitting: Internal Medicine

## 2014-05-30 LAB — CBC WITH DIFFERENTIAL/PLATELET
Basophil #: 0 10*3/uL (ref 0.0–0.1)
Basophil %: 0.5 %
Eosinophil #: 0.1 10*3/uL (ref 0.0–0.7)
Eosinophil %: 1.5 %
HCT: 37.3 % (ref 35.0–47.0)
HGB: 11.8 g/dL — ABNORMAL LOW (ref 12.0–16.0)
LYMPHS PCT: 20.5 %
Lymphocyte #: 1.7 10*3/uL (ref 1.0–3.6)
MCH: 28.6 pg (ref 26.0–34.0)
MCHC: 31.7 g/dL — ABNORMAL LOW (ref 32.0–36.0)
MCV: 90 fL (ref 80–100)
MONOS PCT: 8.1 %
Monocyte #: 0.7 x10 3/mm (ref 0.2–0.9)
Neutrophil #: 5.6 10*3/uL (ref 1.4–6.5)
Neutrophil %: 69.4 %
PLATELETS: 193 10*3/uL (ref 150–440)
RBC: 4.13 10*6/uL (ref 3.80–5.20)
RDW: 13.5 % (ref 11.5–14.5)
WBC: 8.1 10*3/uL (ref 3.6–11.0)

## 2014-05-30 LAB — BASIC METABOLIC PANEL
ANION GAP: 8 (ref 7–16)
BUN: 19 mg/dL — ABNORMAL HIGH (ref 7–18)
CO2: 25 mmol/L (ref 21–32)
Calcium, Total: 9 mg/dL (ref 8.5–10.1)
Chloride: 106 mmol/L (ref 98–107)
Creatinine: 0.98 mg/dL (ref 0.60–1.30)
EGFR (Non-African Amer.): 58 — ABNORMAL LOW
Glucose: 136 mg/dL — ABNORMAL HIGH (ref 65–99)
OSMOLALITY: 282 (ref 275–301)
POTASSIUM: 3.7 mmol/L (ref 3.5–5.1)
Sodium: 139 mmol/L (ref 136–145)

## 2014-05-30 LAB — PROTIME-INR
INR: 0.9
Prothrombin Time: 12.3 secs (ref 11.5–14.7)

## 2014-05-30 LAB — TROPONIN I
TROPONIN-I: 0.1 ng/mL — AB
Troponin-I: 0.1 ng/mL — ABNORMAL HIGH
Troponin-I: 0.09 ng/mL — ABNORMAL HIGH

## 2014-05-30 LAB — CK TOTAL AND CKMB (NOT AT ARMC)
CK, TOTAL: 131 U/L
CK, Total: 122 U/L
CK-MB: 0.9 ng/mL (ref 0.5–3.6)
CK-MB: 1.1 ng/mL (ref 0.5–3.6)

## 2014-05-30 LAB — APTT: Activated PTT: <23 secs — ABNORMAL LOW (ref 23.6–35.9)

## 2014-07-06 ENCOUNTER — Ambulatory Visit (INDEPENDENT_AMBULATORY_CARE_PROVIDER_SITE_OTHER): Payer: Medicare Other | Admitting: General Surgery

## 2014-07-06 ENCOUNTER — Encounter: Payer: Self-pay | Admitting: General Surgery

## 2014-07-06 VITALS — BP 130/80 | HR 78 | Resp 12 | Ht 66.0 in | Wt 136.0 lb

## 2014-07-06 DIAGNOSIS — Z853 Personal history of malignant neoplasm of breast: Secondary | ICD-10-CM

## 2014-07-06 NOTE — Patient Instructions (Addendum)
The patient has been asked to have a unilateral left breast mammogram in the near future. This has been arranged at [facility] for 07-16-14 at 2 pm. She is aware of date, time, and instructions.   Patient to return in February 2016 with office visit only. Continue self breast exams. Call office for any new breast issues or concerns.

## 2014-07-06 NOTE — Progress Notes (Signed)
Patient ID: Jessica Compton, female   DOB: [birth year], 73 y.o.   MRN: [id number]  Chief Complaint  Patient presents with  . Follow-up    3 month breast cancer follow up no mammogram    HPI Jessica Compton is a 73 y.o. female who presents for a 3 month breast cancer follow up. No mammogram done at this time. The patient states she fell approximately 3-4 weeks ago. She states her left breast was sore since then. She was hospitalized over night for assessment of dehydration after passing out. She has completed her radiation therapy.   HPI  Past Medical History  Diagnosis Date  . Diabetes mellitus without complication   . Hypertension     since age 32  . Personal history of tobacco use, presenting hazards to health   . Arthritis   . Other benign neoplasm of connective and other soft tissue of trunk, unspecified 2014  . Special screening for malignant neoplasms, colon   . Aneurysm 2007  . Hyperlipidemia   . Unspecified disorder of skin and subcutaneous tissue 02/19/2013    biopsy of skin over the sternum on the right breast showed hypertrophic scar,keloid formation.  . Personal history of colonic polyps   . Cancer 2007    diagnosed 01/07 left breast with simple mastectomu & sn bx. The pt had a 3.5cm tumor. Frozen section report on the 2 sn were negative. On permanent sections a 0.73m micrometastasis was identified. She was not felt to require a complete axillary dissection. She has completed her Tamoxifen therapy and has been released from routine f/u with medical oncology service.  . Malignant neoplasm of upper-outer quadrant of female breast January 2015    Mastectomy site recurrence resected 01/26/2014, ER 90%, PR 0%, HER-2/neu nonamplified.    Past Surgical History  Procedure Laterality Date  . Colonoscopy w/ polypectomy  2011    Dr. EBrynda Greathouse . Abdominal hysterectomy  2004    total  . Portacath placement  2007  . Port-a-cath removal  2008  . Cholecystectomy  2007  . Mastectomy Left  2007  . Head surgery  1991  . Eye surgery Right     cataract surgery  . Breast surgery Left 2007    mastectomy  . Breast biopsy Right     2015  . Chest wall mass  01/26/14    Family History  Problem Relation Age of Onset  . Cancer Other     ovarian,breast,colon cancers;relations not listed    Social History History  Substance Use Topics  . Smoking status: Current Some Day Smoker -- 1.00 packs/day for 50 years    Types: Cigarettes  . Smokeless tobacco: Never Used  . Alcohol Use: No    No Known Allergies  Current Outpatient Prescriptions  Medication Sig Dispense Refill  . anastrozole (ARIMIDEX) 1 MG tablet       . citalopram (CELEXA) 10 MG tablet Take 1 tablet by mouth daily.      . hydrochlorothiazide (MICROZIDE) 12.5 MG capsule Take 1 capsule by mouth daily.      .Marland Kitchenlisinopril (PRINIVIL,ZESTRIL) 5 MG tablet Take 1 tablet by mouth daily.       No current facility-administered medications for this visit.    Review of Systems Review of Systems  Constitutional: Negative.   Respiratory: Negative.   Cardiovascular: Negative.     Blood pressure 130/80, pulse 78, resp. rate 12, height _0  (1.676 m), weight 136 lb (61.689 kg).  Physical Exam Physical Exam  Constitutional: She is oriented to person, place, and time. She appears well-developed and well-nourished.  Neck: Neck supple. No thyromegaly present.  Cardiovascular: Normal rate, regular rhythm and normal heart sounds.   No murmur heard. Pulmonary/Chest: Effort normal and breath sounds normal. Right breast exhibits no inverted nipple, no mass, no nipple discharge, no skin change and no tenderness.    Left chest wall good with radiation changes.   Lymphadenopathy:    She has no cervical adenopathy.    She has no axillary adenopathy.  Neurological: She is alert and oriented to person, place, and time.  Skin: Skin is warm and dry.    Assessment    No evidence of recurrent malignancy.    Plan     Arrangements are made for a right breast mammogram in the near future. Office followup in February 2016, earlier if problems arise.    The patient has been asked to have a unilateral left breast mammogram in the near future. This has been arranged at [facility] for 07-16-14 at 2 pm. She is aware of date, time, and instructions.   Patient to return in February 2016 with office visit only.  PCP: Tobin Chad 07/07/2014, 2:55 PM

## 2014-07-10 ENCOUNTER — Encounter: Payer: Self-pay | Admitting: General Surgery

## 2014-07-17 ENCOUNTER — Encounter: Payer: Self-pay | Admitting: General Surgery

## 2014-07-21 ENCOUNTER — Encounter: Payer: Self-pay | Admitting: General Surgery

## 2014-09-08 ENCOUNTER — Encounter: Payer: Self-pay | Admitting: General Surgery

## 2014-09-08 ENCOUNTER — Ambulatory Visit (INDEPENDENT_AMBULATORY_CARE_PROVIDER_SITE_OTHER): Payer: Medicare Other | Admitting: General Surgery

## 2014-09-08 ENCOUNTER — Other Ambulatory Visit: Payer: Medicare Other

## 2014-09-08 VITALS — BP 130/72 | Ht 66.0 in | Wt 136.0 lb

## 2014-09-08 DIAGNOSIS — Z853 Personal history of malignant neoplasm of breast: Secondary | ICD-10-CM | POA: Insufficient documentation

## 2014-09-08 DIAGNOSIS — R928 Other abnormal and inconclusive findings on diagnostic imaging of breast: Secondary | ICD-10-CM

## 2014-09-08 NOTE — Progress Notes (Signed)
Patient ID: Jessica Compton, female   DOB: Jun 13, 1941, 73 y.o.   MRN: [id number]  Chief Complaint  Patient presents with  . Follow-up    mammogram    HPI Jessica Compton is a 73 y.o. female who presents for a breast evaluation. The most recent mammogram was done on 07/21/14. Patient does perform regular self breast checks and gets regular mammograms done. The patient denies any problems with the right breast at this time.   HPI  Past Medical History  Diagnosis Date  . Diabetes mellitus without complication   . Hypertension     since age 15  . Personal history of tobacco use, presenting hazards to health   . Arthritis   . Other benign neoplasm of connective and other soft tissue of trunk, unspecified 2014  . Special screening for malignant neoplasms, colon   . Aneurysm 2007  . Hyperlipidemia   . Unspecified disorder of skin and subcutaneous tissue 02/19/2013    biopsy of skin over the sternum on the right breast showed hypertrophic scar,keloid formation.  . Personal history of colonic polyps   . Cancer 2007    diagnosed 01/07 left breast with simple mastectomu & sn bx. The pt had a 3.5cm tumor. Frozen section report on the 2 sn were negative. On permanent sections a 0.68m micrometastasis was identified. She was not felt to require a complete axillary dissection. She has completed her Tamoxifen therapy and has been released from routine f/u with medical oncology service.  . Malignant neoplasm of upper-outer quadrant of female breast January 2015    Mastectomy site recurrence resected 01/26/2014, ER 90%, PR 0%, HER-2/neu nonamplified.    Past Surgical History  Procedure Laterality Date  . Colonoscopy w/ polypectomy  2011    Dr. EBrynda Greathouse . Abdominal hysterectomy  2004    total  . Portacath placement  2007  . Port-a-cath removal  2008  . Cholecystectomy  2007  . Mastectomy Left 2007  . Head surgery  1991  . Eye surgery Right     cataract surgery  . Chest wall mass  01/26/14  . Breast  surgery Left 2007    mastectomy  . Breast biopsy Right January 26, 2014    Core biopsy for mild increase breast uptake on PET scan, usual ductal hyperplasia and stromal fibrosis    Family History  Problem Relation Age of Onset  . Cancer Other     ovarian,breast,colon cancers;relations not listed    Social History History  Substance Use Topics  . Smoking status: Current Some Day Smoker -- 1.00 packs/day for 50 years    Types: Cigarettes  . Smokeless tobacco: Never Used  . Alcohol Use: No    Allergies  Allergen Reactions  . Metoprolol     Dizziness and fatigue    Current Outpatient Prescriptions  Medication Sig Dispense Refill  . anastrozole (ARIMIDEX) 1 MG tablet       . atorvastatin (LIPITOR) 20 MG tablet       . citalopram (CELEXA) 10 MG tablet Take 1 tablet by mouth daily.      . hydrochlorothiazide (MICROZIDE) 12.5 MG capsule Take 1 capsule by mouth daily.      .Marland Kitchenlisinopril (PRINIVIL,ZESTRIL) 5 MG tablet Take 1 tablet by mouth daily.       No current facility-administered medications for this visit.    Review of Systems Review of Systems  Constitutional: Negative.   Respiratory: Negative.   Cardiovascular: Negative.     Blood pressure  130/72, height 5' 6"  (1.676 m), weight 136 lb (61.689 kg).  Physical Exam Physical Exam  Constitutional: She is oriented to person, place, and time. She appears well-developed and well-nourished.  Neck: Neck supple. No thyromegaly present.  Cardiovascular: Normal rate, regular rhythm and normal heart sounds.   No murmur heard. Pulmonary/Chest: Effort normal and breath sounds normal.   Right breast exhibits no inverted nipple, no mass, no nipple discharge, no skin change and no tenderness.    Left lumpectomy site is well healed. Chest wall and axilla of the left is well.   Focal thickening at 3 o'clock at old biopsy spot of right breast.  Lymphadenopathy:    She has no cervical adenopathy.    She has no axillary  adenopathy.  Neurological: She is alert and oriented to person, place, and time.  Skin: Skin is warm and dry.    Data Reviewed Right breast mammogram dated 02/16/2014 suggested a new 6 mm rounded density in the medial aspect of the right breast. BI-RAD-0. Focal spot compression views dated 07/21/2014 showed this to be a partially circumscribed round density in the upper inner right breast 5-6 mm from the nipple.  Ultrasound examination of the right breast completed from the 1 to 5:00 position showed a focal hypoechoic area at the 3:00 position 5-6 cm from the nipple measuring in maximum diameter 0.53 cm. Vein acoustic shadowing was noted. This corresponds to the needle trajectory from her retroareolar biopsy completed February 2015. BI-RAD-3.  Assessment    Suspected residual hematoma post right breast biopsy accounting for present mammographic abnormality.  Good healing of the left chest wall status post reexcision and chest wall radiation.    Plan    Considering the previous negative biopsy in the location of the lesion on her current mammogram and its relationship to the recent biopsy, I think observation is reasonable. This was discussed with the patient and her daughter. We'll plan for followup right breast diagnostic mammogram in 6 months. If the nodularity areas more pronounced will proceed to biopsy.  The patient will continue her aromatase inhibitor.    PCP: Tobin Chad 09/08/2014, 2:49 PM

## 2014-09-08 NOTE — Patient Instructions (Signed)
Patient to return in 6 months for follow up with right breast mammogram. Continue self breast exams. Call office for any new breast issues or concerns.

## 2014-10-26 ENCOUNTER — Encounter: Payer: Self-pay | Admitting: General Surgery

## 2014-11-25 ENCOUNTER — Ambulatory Visit: Payer: Self-pay | Admitting: Internal Medicine

## 2015-03-04 ENCOUNTER — Encounter: Payer: Self-pay | Admitting: General Surgery

## 2015-03-09 ENCOUNTER — Encounter: Payer: Self-pay | Admitting: General Surgery

## 2015-03-10 ENCOUNTER — Ambulatory Visit (INDEPENDENT_AMBULATORY_CARE_PROVIDER_SITE_OTHER): Payer: Medicare Other | Admitting: General Surgery

## 2015-03-10 ENCOUNTER — Encounter: Payer: Self-pay | Admitting: General Surgery

## 2015-03-10 VITALS — BP 122/74 | HR 68 | Resp 14 | Ht 66.0 in | Wt 134.0 lb

## 2015-03-10 DIAGNOSIS — Z853 Personal history of malignant neoplasm of breast: Secondary | ICD-10-CM | POA: Diagnosis not present

## 2015-03-10 DIAGNOSIS — Z86010 Personal history of colonic polyps: Secondary | ICD-10-CM | POA: Diagnosis not present

## 2015-03-10 MED ORDER — POLYETHYLENE GLYCOL 3350 17 GM/SCOOP PO POWD: ORAL | Status: DC

## 2015-03-10 NOTE — Patient Instructions (Addendum)
The patient has been asked to return to the office in one year with a right screening  mammogram. Colonoscopy A colonoscopy is an exam to look at the entire large intestine (colon). This exam can help find problems such as tumors, polyps, inflammation, and areas of bleeding. The exam takes about 1 hour.  LET [facility] CARE PROVIDER KNOW ABOUT:   Any allergies you have.  All medicines you are taking, including vitamins, herbs, eye drops, creams, and over-the-counter medicines.  Previous problems you or members of your family have had with the use of anesthetics.  Any blood disorders you have.  Previous surgeries you have had.  Medical conditions you have. RISKS AND COMPLICATIONS  Generally, this is a safe procedure. However, as with any procedure, complications can occur. Possible complications include:  Bleeding.  Tearing or rupture of the colon wall.  Reaction to medicines given during the exam.  Infection (rare). BEFORE THE PROCEDURE   Ask your health care provider about changing or stopping your regular medicines.  You may be prescribed an oral bowel prep. This involves drinking a large amount of medicated liquid, starting the day before your procedure. The liquid will cause you to have multiple loose stools until your stool is almost clear or light green. This cleans out your colon in preparation for the procedure.  Do not eat or drink anything else once you have started the bowel prep, unless your health care provider tells you it is safe to do so.  Arrange for someone to drive you home after the procedure. PROCEDURE   You will be given medicine to help you relax (sedative).  You will lie on your side with your knees bent.  A long, flexible tube with a light and camera on the end (colonoscope) will be inserted through the rectum and into the colon. The camera sends video back to a computer screen as it moves through the colon. The colonoscope also releases carbon  dioxide gas to inflate the colon. This helps your health care provider see the area better.  During the exam, your health care provider may take a small tissue sample (biopsy) to be examined under a microscope if any abnormalities are found.  The exam is finished when the entire colon has been viewed. AFTER THE PROCEDURE   Do not drive for 24 hours after the exam.  You may have a small amount of blood in your stool.  You may pass moderate amounts of gas and have mild abdominal cramping or bloating. This is caused by the gas used to inflate your colon during the exam.  Ask when your test results will be ready and how you will get your results. Make sure you get your test results. Document Released: 12/08/2000 Document Revised: 10/01/2013 Document Reviewed: 08/18/2013 [facility] Patient Information 2015 [place], [place]. This information is not intended to replace advice given to you by your health care provider. Make sure you discuss any questions you have with your health care provider.  Patient has been scheduled for a colonoscopy on 04-20-15 at [facility].

## 2015-03-10 NOTE — Progress Notes (Signed)
Patient ID: Jessica Compton, female   DOB: 03-21-1941, 74 y.o.   MRN: [id number]  Chief Complaint  Patient presents with  . Follow-up    mammogram    HPI Jessica Compton is a 74 y.o. female who presents for a breast evaluation. The most recent mammogram was done on 03/02/15. The patient is accompanied today by her daughter and granddaughter. Patient does perform regular self breast checks and gets regular mammograms done. Patient is also wanting to discuss another colonoscopy. Last colonoscopy was done 02/2010. No Gi problems at tthis time.   HPI  Past Medical History  Diagnosis Date  . Diabetes mellitus without complication   . Hypertension     since age 89  . Personal history of tobacco use, presenting hazards to health   . Arthritis   . Other benign neoplasm of connective and other soft tissue of trunk, unspecified 2014  . Special screening for malignant neoplasms, colon   . Aneurysm 2007  . Hyperlipidemia   . Unspecified disorder of skin and subcutaneous tissue 02/19/2013    biopsy of skin over the sternum on the right breast showed hypertrophic scar,keloid formation.  . Personal history of colonic polyps   . Cancer 2007    diagnosed 01/07 left breast with simple mastectomu & sn bx. The pt had a 3.5cm tumor. Frozen section report on the 2 sn were negative. On permanent sections a 0.30m micrometastasis was identified. She was not felt to require a complete axillary dissection. She has completed her Tamoxifen therapy and has been released from routine f/u with medical oncology service.  . Malignant neoplasm of upper-outer quadrant of female breast January 2015    Mastectomy site recurrence resected 01/26/2014, ER 90%, PR 0%, HER-2/neu nonamplified.    Past Surgical History  Procedure Laterality Date  . Colonoscopy w/ polypectomy  2011    Dr. EBrynda Greathouse . Abdominal hysterectomy  2004    total  . Portacath placement  2007  . Port-a-cath removal  2008  . Cholecystectomy  2007  .  Mastectomy Left 2007  . Head surgery  1991  . Eye surgery Right     cataract surgery  . Chest wall mass  01/26/14  . Breast surgery Left 2007    mastectomy  . Breast biopsy Right January 26, 2014    Core biopsy for mild increase breast uptake on PET scan, usual ductal hyperplasia and stromal fibrosis    Family History  Problem Relation Age of Onset  . Cancer Other     ovarian,breast,colon cancers;relations not listed    Social History History  Substance Use Topics  . Smoking status: Current Some Day Smoker -- 1.00 packs/day for 50 years    Types: Cigarettes  . Smokeless tobacco: Never Used  . Alcohol Use: No    Allergies  Allergen Reactions  . Metoprolol     Dizziness and fatigue    Current Outpatient Prescriptions  Medication Sig Dispense Refill  . anastrozole (ARIMIDEX) 1 MG tablet     . atorvastatin (LIPITOR) 20 MG tablet     . citalopram (CELEXA) 10 MG tablet Take 1 tablet by mouth daily.    . hydrochlorothiazide (MICROZIDE) 12.5 MG capsule Take 1 capsule by mouth daily.    .Marland Kitchenlisinopril (PRINIVIL,ZESTRIL) 5 MG tablet Take 1 tablet by mouth daily.    . polyethylene glycol powder (GLYCOLAX/MIRALAX) powder 255 grams one bottle for colonoscopy prep 255 g 0   No current facility-administered medications for this visit.  Review of Systems Review of Systems  Constitutional: Negative.   Respiratory: Negative.   Cardiovascular: Negative.     Blood pressure 122/74, pulse 68, resp. rate 14, height 5' 6"  (1.676 m), weight 134 lb (60.782 kg).  Physical Exam Physical Exam  Constitutional: She is oriented to person, place, and time. She appears well-developed and well-nourished.  Eyes: Conjunctivae are normal. No scleral icterus.  Neck: Neck supple.  Cardiovascular: Normal rate, regular rhythm and normal heart sounds.   Pulmonary/Chest: Effort normal and breath sounds normal.   Right breast exhibits no inverted nipple, no mass, no nipple discharge, no skin change and  no tenderness.    Well healed left mastectomy, and upper chest wall incision is clean and well healed.   Abdominal: Soft. Bowel sounds are normal. There is no tenderness.  Lymphadenopathy:    She has no cervical adenopathy.  Neurological: She is alert and oriented to person, place, and time.  Skin: Skin is warm and dry.    Data Reviewed Right breast mammogram dated 03/02/2015 was reviewed. Previously identified subcentimeter density in the upper inner breast is decreased in size from past studies. BI-RADS-3.  Assessment    Benign breast exam.  Past history colonic polyps.    Plan    The radiologist recommendation for six-month follow-up was reviewed with the patient and her family. In light of the decreasing size in this area and benign clinical exam, I believe a 1 year follow-up is appropriate. This is agreeable to the patient. Discussed the risks and benefits of repeat colonoscopy. Patient is amenable.    Patient has been scheduled for a colonoscopy on 04-20-15 at [facility].     PCP:  Heywood Iles  03/11/2015, 7:09 AM

## 2015-03-11 ENCOUNTER — Other Ambulatory Visit: Payer: Self-pay | Admitting: General Surgery

## 2015-03-11 DIAGNOSIS — Z86010 Personal history of colonic polyps: Secondary | ICD-10-CM

## 2015-04-14 ENCOUNTER — Telehealth: Payer: Self-pay | Admitting: *Deleted

## 2015-04-14 NOTE — Telephone Encounter (Signed)
Patient was contacted today to review medications. This patient states she has had no changes since last office visit.   This patient has pre-registered but has yet to pick up Miralax prescription.   She was instructed to call the office should she have further questions.   We will proceed with colonoscopy that is scheduled at [facility] for 04-20-15.

## 2015-04-17 NOTE — Consult Note (Signed)
Reason for Visit: This 74 year old Female patient presents to the clinic for initial evaluation of  recurrent breast cancer .   Referred by Dr Ma Hillock.  Diagnosis:  Chief Complaint/Diagnosis   74 year old female with recurrent invasive mammary carcinoma toleft chest wall status post left modified radical mastectomy in 2007 for ER/PR-positive disease with one micrometer in the sentinel lymph node now with biopsy-proven chest wall recurrence  Pathology Report pathology report reviewed   Imaging Report PET CT scan reviewed   Referral Report clinical notes reviewed   Planned Treatment Regimen adjuvant chest wall and peripheral fat radiation   HPI   patient is a 74 year old female with history dates back to January 2007 when she had a left modified radical mastectomy for a 3.5 cm invasive mammary carcinoma. 2 sentinel lymph nodes were examined there was a 0.5 mm micrometastasis in one. She received adjuvant hormonal therapy no radiation at that time. Patient discontinued her aromatase inhibitor and had a self discovered mass in her left chest wall. She underwent excisional biopsy positive for invasive mammary carcinoma tumor was ER positive PR negative HER-2/neu not over expressed. Adipose tissue and skeletal muscle were involved. Margins were clear.PET scan was performed showed a hypermetabolic focus beneath the left pectoralis muscle muscle measuring 18 mm. Case was presented her weekly tumor conference recommendation was made for adjuvant radiation therapy to her left chest wall and peripheral lymphatics. She's also a candidate for further aromatase inhibitor therapy by medical oncology. She is seen today for consideration of treatment she is doing well. She specifically denies chest wall tenderness cough or bone pain.  Past Hx:    Hypertension:    breast ca:   Past, Family and Social History:  Past Medical History positive   Cardiovascular hypertension   Past Surgical History  hysterectomy   Past Medical History Comments arthritis especially in her back.   Family History positive   Family History Comments family history positive for adult onset diabetes and hypertension no breast cancer.   Social History positive   Social History Comments 40 pack year smoking history no EtOH use history   Additional Past Medical and Surgical History accompanied by multiple family members today.   Allergies:   No Known Allergies:   Home Meds:  Home Medications: Medication Instructions Status  Tylenol 325 mg oral tablet 2 tab(s) orally every 4 hours, As Needed - for Pain Active  lisinopril 5 mg oral tablet 1 tab(s) orally once a day Active  hydrochlorothiazide 12.5 mg oral tablet 1 tab(s) orally once a day Active  citalopram 10 mg oral tablet 1 tab(s) orally once a day Active   Review of Systems:  General negative   Performance Status (ECOG) 0   Skin negative   Breast see HPI   Ophthalmologic negative   ENMT negative   Respiratory and Thorax negative   Cardiovascular negative   Gastrointestinal negative   Genitourinary negative   Musculoskeletal negative   Neurological negative   Psychiatric negative   Hematology/Lymphatics negative   Endocrine negative   Allergic/Immunologic negative   Review of Systems   review of systems obtained from nurses notes  Nursing Notes:  Nursing Vital Signs and Chemo Nursing Nursing Notes: *CC Vital Signs Flowsheet:   04-Mar-15 10:14  Temp Temperature 97.9  Pulse Pulse 91  Respirations Respirations 22  SBP SBP 124  DBP DBP 80  Pain Scale (0-10)  0  Current Weight (kg) (kg) 62.1  Height (cm) centimeters 165.1  BSA (m2) 1.6  Physical Exam:  General/Skin/HEENT:  General normal   Eyes normal   ENMT normal   Head and Neck normal   Additional PE well-developed thin female in NAD. She status post left modified radical mastectomy. Superior to her mastectomy scar she has a small incisional site where  the recurrence was removed. No evidence of chest wall nodularity or masses identified. Right breast is free of dominant mass or nodularity in 2 positions examined. No axillary or supraclavicular adenopathy is appreciated. No lymphedema of her left upper extremity is noted. Lungs are clear to A&P cardiac examination shows regular rate and rhythm.   Breasts/Resp/CV/GI/GU:  Respiratory and Thorax normal   Cardiovascular normal   Gastrointestinal normal   Genitourinary normal   MS/Neuro/Psych/Lymph:  Musculoskeletal normal   Neurological normal   Lymphatics normal   Other Results:  Radiology Results: LabUnknown:    15-Jan-15 14:26, PET/CT Scan Breast CA Stage/Restaging  PACS Image   Nuclear Med:  PET/CT Scan Breast CA Stage/Restaging   REASON FOR EXAM:    History of Breast CA Postive biopsy left chest wall   mass Jan 01 2014  COMMENTS:       PROCEDURE: PET - PET/CT RESTG BREAST CA  - Jan 08 2014  2:26PM     CLINICAL DATA:  Subsequent treatment strategy for breast carcinoma.    EXAM:  NUCLEAR MEDICINE PET SKULL BASE TO THIGH    FASTING BLOOD GLUCOSE:  Value: 31m/dl    TECHNIQUE:  12.8 mCi F-18 FDG was injected intravenously. CT data was obtained  and used for attenuation correction and anatomic localization only.  (This was not acquired as a diagnostic CT examination.) Additional  exam technical data entered on technologist worksheet.    COMPARISON:  NM PET TUM IMG RESTAG (PS) SKULL BASE T - THIGH dated  01/15/2008 all    FINDINGS:  NECK    No hypermetabolic lymph nodes in the neck.    CHEST    Patient status post left mastectomy. There is a hypermetabolic  nodular focus beneath the medial aspect of the left pectoralis  muscle measuring 18 mm (image 88) with SUV max 5.4. No additional  hypermetabolic axillary lymph nodes.No hypermetabolic mediastinal  lymph nodes. No suspicious pulmonary nodules.    ABDOMEN/PELVIS    No abnormal hypermetabolic activity within  the liver, pancreas,  adrenal glands, or spleen. No hypermetabolic lymph nodes in the  abdomen or pelvis.    SKELETON    No focal hypermetabolic activity to suggest skeletal metastasis.   IMPRESSION:  1. Hypermetabolic soft tissue nodule beneath the medial aspect of  the left pectoralis muscle is concerning for local breast cancer  recurrence although not entirely specific. Recommend tissue  sampling.  2. No additional evidence of local nodal metastasis or distant  metastasis.      Electronically Signed    By: SSuzy BouchardM.D.    On: 01/08/2014 15:57         Verified By: JRennis Golden M.D.,   Relevent Results:   Relevant Scans and Labs PET CTscans are reviewed   Assessment and Plan: Impression:   local recurrent chest wall recurrence of invasive mammary carcinoma in 74year old female now out 8 years since left modified radical mastectomy. Plan:   at this time I recommend going ahead with left chest wall and peripheral lymphatic radiation therapy would treat both areas to 5000 cGy boost the area of known hypermetabolic activity on PET CT scan another 2000 cGy electron beam. Risks and benefits  of treatment including skin reaction, fatigue, inclusion of some normal lung volume, possibility of lymphedema of her left upper extremity, or explained in detail to the patient and her family. I set the patient up for CT simulation next week. Patient also be a candidate for aromatase inhibitor after completion of radiation. Case was discussed our weekly tumor conference.  I would like to take this opportunity for allowing me to participate in the care of your patient..  CC Referral:  cc: Dr. Bary Castilla   Electronic Signatures: Baruch Gouty, Roda Shutters (MD)  (Signed 04-Mar-15 13:27)  Authored: HPI, Diagnosis, Past Hx, PFSH, Allergies, Home Meds, ROS, Nursing Notes, Physical Exam, Other Results, Relevent Results, Encounter Assessment and Plan, CC Referring Physician   Last Updated:  04-Mar-15 13:27 by Armstead Peaks (MD)

## 2015-04-17 NOTE — Discharge Summary (Signed)
PATIENT NAME:  Jessica, Compton MR#:  [id number] DATE OF BIRTH:  [birth year]  DATE OF ADMISSION:  05/30/2014 DATE OF DISCHARGE:  05/30/2014  DISCHARGE DIAGNOSES: 1. Acute gastroenteritis.  2. Dehydration.  3. Dizziness.  4. Elevated troponin.  5. Breast cancer.  6. Hypertension.  7. Depression.   IMAGING STUDIES: Chest x-ray, portable, which showed no acute cardiopulmonary findings.   CONSULTATIONS: Dr. Nehemiah Massed with cardiology.   ADMITTING HISTORY AND PHYSICAL: Please see detailed H and P dictated by Dr. Margaretmary Eddy. In brief, a 74 year old female patient with history of hypertension and breast cancer, presented to the hospital complaining of diarrhea, dizziness of one day. The patient was found to have mildly elevated troponin of 0.1 and was admitted to the hospitalist service for further work-up. The patient did receive heparin shot and drip in the ER.   HOSPITAL COURSE: The patient was admitted onto a tele floor. Two more sets of cardiac enzymes were done with troponin coming down with the second one being 0.1 and the third 0.09. The patient did not have any arrhythmias on the tele. EKG showed nothing acute. The patient's diarrhea, nausea, vomiting, had resolved. Her gastroenteritis had resolved. No bowel movements in the hospital to get any stool samples. The patient was seen by Dr. Nehemiah Massed, who did not feel the patient had any acute cardiac. Her elevated troponin was thought to be demand ischemia. The patient is being discharged back home after being rehydrated, her diarrhea being treated. She is being started on a baby aspirin. She will follow up with her primary care physician in 1 to 2 weeks. If there is any further concern for any cardiac involvement, the patient will need outpatient work-up with cardiology, Dr. Nehemiah Massed.   DISCHARGE MEDICATIONS: 1. Aspirin 81 mg daily.  2. Citalopram 10 mg daily.  3. Anastrozole 1 mg daily.  4. Hydrochlorothiazide 12.5 mg daily.  5. Lisinopril 5 mg  daily.  6. Tylenol 325 mg 2 tablets orally every day.   DISCHARGE INSTRUCTIONS: Low-salt diet. Activity as tolerated. Follow up with primary care physician in 1 to 2 weeks.    ____________________________ Leia Alf Acxel Dingee, MD srs:lm D: 05/30/2014 13:04:15 ET T: 05/30/2014 22:45:24 ET JOB#: [id number]  cc: Alveta Heimlich R. Greco Gastelum, MD, <Dictator> Neita Carp MD ELECTRONICALLY SIGNED 06/04/2014 16:39

## 2015-04-17 NOTE — H&P (Signed)
PATIENT NAME:  Jessica Compton, Jessica Compton MR#:  [id number] DATE OF BIRTH:  04-13-1941  DATE OF ADMISSION:  05/30/2014  PRIMARY CARE PHYSICIAN:  Nonlocal.   REFERRING PHYSICIAN:  Dr. Karma Greaser.  PRIMARY ONCOLOGIST:  Dr. Ma Hillock.   CHIEF COMPLAINT:  Diarrhea and dizziness.   HISTORY OF PRESENT ILLNESS:  The patient is a 74 year old African American female with a past medical history of breast cancer, status post left-sided mastectomy, status post radiation therapy, last radiation therapy in May of 2015, COPD, hypertension, and coronary artery disease, is presenting to the ER with a chief complaint of diarrhea for the past two days.  The patient is reporting that she has been having diarrhea with no blood for the past two days.  Denies any abdominal pain.  The patient felt dizzy, but denies any loss of consciousness.  As the patient is feeling dizzy the patient came into the ER.  Denies any chest pain or shortness of breath, but her troponin is elevated at 0.1.  EKG did not reveal any changes.  The patient denies any history of coronary artery disease in the past, never seen by any cardiologist.  The patient was given aspirin and heparin drip was initiated by the ER physician.  Hospitalist team is called to admit the patient.  During my examination, the patient is resting comfortably with no history of chest pain or shortness of breath.  Denies any dizziness.  No other complaints.    PAST MEDICAL HISTORY:  Breast cancer status post left mastectomy and currently getting radiation therapy, last radiation therapy was in May 2015, coronary artery disease, hypertension, COPD.   PAST SURGICAL HISTORY:  Status post left mastectomy, clips were placed for cerebral aneurysm, cholecystectomy.  ALLERGIES:  No known drug allergies.   PSYCHOSOCIAL HISTORY:  Lives at home, lives with son.  Denies any alcohol or illicit drug usage, but smokes, 1 pack lasts for 1-1/2 days.   FAMILY HISTORY:  Hypertension runs in her family.    HOME MEDICATIONS:  Tylenol 325 mg 2 tablets by mouth every 4 hours as needed, lisinopril 5 mg 1 tablet by mouth once daily, hydrochlorothiazide 12.5 mg 1 tablet by mouth once daily, anastrozole 1 mg 1 tablet by mouth once daily.   REVIEW OF SYSTEMS: CONSTITUTIONAL:  Denies any fever or fatigue.  EYES:  Denies any blurry vision, double vision.  EARS, NOSE, THROAT:  Denies epistaxis, discharge, or tinnitus.  RESPIRATION:  Denies cough.  Has chronic history of COPD, still continues to smoke.  CARDIOVASCULAR:  No chest pain, palpitations.  GASTROINTESTINAL:  Denies nausea, vomiting, but complaining of diarrhea. NEUROLOGIC:  Denies any history of vertigo or ataxia. Denies any history of TIA or CVA.  ENDOCRINE:  Denies polyuria, nocturia, thyroid problems. Denies any heat or cold intolerance  MUSCULOSKELETAL: No joint effusion, tenderness. Denies any history of gout or arthritis.  PSYCHIATRIC: Denies any history of ADD or OCD. Denies any history of anxiety or depression either.   PHYSICAL EXAMINATION: VITAL SIGNS:  Temperature 98.9, pulse 70, respirations 18, blood pressure 136/71, pulse ox 98%.  GENERAL APPEARANCE:  Not in any acute distress.  Moderately built and nourished.  HEENT:  Normocephalic, atraumatic.  Pupils are equally reacting to light and accommodation.  No scleral icterus.  No conjunctival injection.  No sinus tenderness.  No postnasal drip.  NECK:  Supple.  No JVD.  No thyromegaly.  Range of motion is intact.  LUNGS:  Clear to auscultation bilaterally.  No accessory muscle usage.  No anterior  chest wall tenderness on palpation.   CARDIOVASCULAR:  S1, S2 normal.  Regular rate and rhythm.  No murmurs.  GASTROINTESTINAL:  Soft.  Bowel sounds are positive in all four quadrants.  Nontender, nondistended.  No hepatosplenomegaly.  No masses felt.  Urostomy site is intact.  NEUROLOGIC:  Awake, alert, oriented x 3.  Cranial nerves II through XII are intact.  Motor and sensory are intact.   Reflexes are 2+.  EXTREMITIES:  No edema.  No cyanosis.  No clubbing.  SKIN:  Warm to touch.  Normal turgor.  No rashes.  No lesions.  No joint effusion.   LABORATORY AND IMAGING STUDIES:  Accu-Chek 136.  Chem-8 is normal except glucose at 136, BUN at 19, GFR is greater than 60.  Serum osmolality is 9.0, CK 122, Troponin  0.01.  WBC 8.1, hemoglobin is 11.8, hematocrit is normal, platelet count is 193,000.  Portable chest x-ray has revealed no acute findings.   ASSESSMENT AND PLAN:  A 74 year old female presented to the Emergency Room with a chief complaint of diarrhea for the past 1-1/2 days associated with dizziness, will be admitted with the following assessment and plan.  1.  Possible non-ST-elevation myocardial infarction.  Positive troponin.  EKG did not reveal any changes.  The patient will be admitted to telemetry.  Heparin bolus was given and heparin is initiated in the Emergency Room.  Cycle cardiac biomarkers.  Cardiology consult is placed to Dr. Fletcher Anon.  The patient will be on aspirin, beta blocker and statin.  2.  Near syncope, probably from diarrhea.  We will provide intravenous fluids.  Stool will be tested for Clostridium difficile toxins and stool culture and sensitivity, stool for occult blood.  3.  Chronic history of chronic obstructive pulmonary disease, not under exacerbation.  We will provide nebulizer treatments on an as-needed basis.  4.  History of coronary artery disease.  Denies any chest pain at this time.  Resume her home medications.  5.  History of breast cancer, status post radiation therapy.  Follow up with Dr. Ma Hillock as an outpatient as scheduled.   6.  We will provide her gastrointestinal prophylaxis.  7.  Deep vein thrombosis prophylaxis is not needed as the patient is on heparin drip.  8.  CODE STATUS:  THE PATIENT IS FULL CODE.  Daughter is the medical power of attorney.   Diagnosis and plan of care was discussed in detail with the patient.  She is aware of the  plan.    Total time spent on the admission is 50 minutes.    ____________________________ Nicholes Mango, MD ag:ea D: 05/30/2014 35:70:17 ET T: 05/30/2014 07:04:01 ET JOB#: [id number]  cc: Nicholes Mango, MD, <Dictator> Sandeep R. Ma Hillock, MD  Nicholes Mango MD ELECTRONICALLY SIGNED 06/01/2014 0:58

## 2015-04-17 NOTE — Op Note (Signed)
PATIENT NAME:  Jessica Compton, Jessica Compton MR#:  [id number] DATE OF BIRTH:  Jun 07, 1941  DATE OF PROCEDURE:  01/26/2014  PREOPERATIVE DIAGNOSES: 1.  Chest wall recurrence, left breast cancer.  2.  Abnormal imaging, right breast.   POSTOPERATIVE DIAGNOSES: 1.  Chest wall recurrence, left breast cancer.  2.  Abnormal imaging, right breast.   OPERATIVE PROCEDURE: 1.  Right breast, core biopsy.  2.  Excision, left chest wall recurrence.   SURGEON: Hervey Ard, M.D.   ANESTHESIA: General by LMA under Dr. Ronelle Nigh, Marcaine 0.5% with 1:200,000 units of epinephrine, 30 mL local infiltration.   ESTIMATED BLOOD LOSS: 10 mL.   CLINICAL NOTE: This 74 year old woman is 7-1/2 years status post a left modified radical mastectomy for a T2, N1 carcinoma. She recently noted a mass on the left chest wall and core biopsy showed evidence of invasive mammary carcinoma. PET scan showed diffuse increased uptake throughout the right breast, the level similar to that noted in the liver. While the radiologist felt this was "physiologic," it would be unusual for a 74 year old woman without estrogen stimulation to have this much activity in the breast and core biopsy was felt appropriate.   OPERATIVE NOTE: With the patient under adequate general anesthesia, the right breast, chest and the left chest wall was prepped with ChloraPrep and draped. Marcaine was infiltrated through the medial aspect of the right breast and multiple 14-gauge core samples obtained. These were sent fresh to pathology per protocol. Skin defect was closed with benzoin and Steri-Strips, followed by Telfa and Tegaderm dressing.   Attention was then turned to the upper outer aspect of the left chest wall where the 2.5 to 3 cm mass was evident. Marcaine was infiltrated around the area for postoperative analgesia. A skin line incision over the mass was carried out the skin and subcutaneous tissue. The remaining dissection was completed with electrocautery.  Hemostasis was with electrocautery and 3-0 Vicryl ties. The mass was excised to grossly negative margins, orientated for pathology and placed in formalin for routine histology. The pectoralis major muscle, which had been divided to remove this segment of malignancy, was then reapproximated with a running 2-0 Vicryl suture. Adipose layer was approximated with a running 3-0 Vicryl  and the skin closed with running 4-0 Vicryl subcuticular suture. Benzoin, Steri-Strips, Telfa and Tegaderm dressing was then applied.   The patient tolerated the procedure well and was taken to the recovery room in stable condition.     ____________________________ [name], MD jwb:dmm D: 01/26/2014 10:41:48 ET T: 01/26/2014 12:36:22 ET JOB#: [id number]  cc: [name], MD, <Dictator> [place] MD ELECTRONICALLY SIGNED 01/26/2014 16:56

## 2015-04-20 ENCOUNTER — Ambulatory Visit
Admit: 2015-04-20 | Disposition: A | Discharge status: Home / Self Care | Payer: Self-pay | Attending: General Surgery | Admitting: General Surgery

## 2015-04-20 DIAGNOSIS — Z86010 Personal history of colonic polyps: Secondary | ICD-10-CM | POA: Diagnosis not present

## 2015-04-20 DIAGNOSIS — D124 Benign neoplasm of descending colon: Secondary | ICD-10-CM | POA: Diagnosis not present

## 2015-04-21 ENCOUNTER — Encounter: Payer: Self-pay | Admitting: General Surgery

## 2015-04-22 LAB — SURGICAL PATHOLOGY

## 2015-04-23 ENCOUNTER — Encounter: Payer: Self-pay | Admitting: General Surgery

## 2015-04-23 ENCOUNTER — Telehealth: Payer: Self-pay

## 2015-04-23 NOTE — Telephone Encounter (Signed)
-----   Message from [name], MD sent at 04/23/2015 10:02 AM EDT ----- Please notify the patient the polyp removed we will plan on a follow-up exam in 5 years. Please place in recalls. Thank yo ----- Message -----    From: Augustin Schooling, CMA    Sent: 04/23/2015   8:37 AM      To: [name], MD

## 2015-04-23 NOTE — Telephone Encounter (Signed)
Notified patient as instructed, patient pleased. Discussed follow-up colonoscopy in 5 years, patient agrees. Patient placed in recalls.

## 2015-05-27 ENCOUNTER — Other Ambulatory Visit: Payer: Self-pay | Admitting: Internal Medicine

## 2015-05-28 NOTE — Telephone Encounter (Signed)
This patient did not show up for her 08/2014 appt and has no scheduled fu appt with Dr Ma Hillock

## 2015-06-14 ENCOUNTER — Other Ambulatory Visit: Payer: Self-pay

## 2015-06-14 DIAGNOSIS — C50912 Malignant neoplasm of unspecified site of left female breast: Secondary | ICD-10-CM

## 2015-06-15 ENCOUNTER — Inpatient Hospital Stay: Payer: Medicare Other | Attending: Internal Medicine

## 2015-06-15 ENCOUNTER — Inpatient Hospital Stay (HOSPITAL_BASED_OUTPATIENT_CLINIC_OR_DEPARTMENT_OTHER): Payer: Medicare Other | Admitting: Internal Medicine

## 2015-06-15 VITALS — BP 143/79 | HR 70 | Temp 97.1°F | Ht 66.0 in | Wt 144.4 lb

## 2015-06-15 DIAGNOSIS — Z86010 Personal history of colonic polyps: Secondary | ICD-10-CM | POA: Diagnosis not present

## 2015-06-15 DIAGNOSIS — Z79899 Other long term (current) drug therapy: Secondary | ICD-10-CM | POA: Insufficient documentation

## 2015-06-15 DIAGNOSIS — Z79811 Long term (current) use of aromatase inhibitors: Secondary | ICD-10-CM

## 2015-06-15 DIAGNOSIS — Z170 Estrogen receptor positive status [ER+]: Secondary | ICD-10-CM | POA: Insufficient documentation

## 2015-06-15 DIAGNOSIS — Z90710 Acquired absence of both cervix and uterus: Secondary | ICD-10-CM | POA: Diagnosis not present

## 2015-06-15 DIAGNOSIS — F17210 Nicotine dependence, cigarettes, uncomplicated: Secondary | ICD-10-CM | POA: Insufficient documentation

## 2015-06-15 DIAGNOSIS — E785 Hyperlipidemia, unspecified: Secondary | ICD-10-CM

## 2015-06-15 DIAGNOSIS — R5383 Other fatigue: Secondary | ICD-10-CM

## 2015-06-15 DIAGNOSIS — Z923 Personal history of irradiation: Secondary | ICD-10-CM | POA: Insufficient documentation

## 2015-06-15 DIAGNOSIS — I10 Essential (primary) hypertension: Secondary | ICD-10-CM

## 2015-06-15 DIAGNOSIS — Z87891 Personal history of nicotine dependence: Secondary | ICD-10-CM | POA: Insufficient documentation

## 2015-06-15 DIAGNOSIS — C50412 Malignant neoplasm of upper-outer quadrant of left female breast: Secondary | ICD-10-CM | POA: Insufficient documentation

## 2015-06-15 DIAGNOSIS — C50912 Malignant neoplasm of unspecified site of left female breast: Secondary | ICD-10-CM

## 2015-06-15 DIAGNOSIS — M129 Arthropathy, unspecified: Secondary | ICD-10-CM | POA: Insufficient documentation

## 2015-06-15 DIAGNOSIS — Z853 Personal history of malignant neoplasm of breast: Secondary | ICD-10-CM

## 2015-06-15 DIAGNOSIS — E119 Type 2 diabetes mellitus without complications: Secondary | ICD-10-CM | POA: Diagnosis not present

## 2015-06-15 LAB — CREATININE, SERUM
CREATININE: 1 mg/dL (ref 0.44–1.00)
GFR calc Af Amer: >60 mL/min (ref >60)
GFR, EST NON AFRICAN AMERICAN: 55 mL/min — AB (ref >60)

## 2015-06-15 LAB — HEPATIC FUNCTION PANEL
ALK PHOS: 75 U/L (ref 38–126)
ALT: 13 U/L — ABNORMAL LOW (ref 14–54)
AST: 24 U/L (ref 15–41)
Albumin: 3.9 g/dL (ref 3.5–5.0)
BILIRUBIN DIRECT: 0.1 mg/dL (ref 0.1–0.5)
BILIRUBIN TOTAL: 0.6 mg/dL (ref 0.3–1.2)
Indirect Bilirubin: 0.5 mg/dL (ref 0.3–0.9)
Total Protein: 7.6 g/dL (ref 6.5–8.1)

## 2015-06-15 LAB — CBC WITH DIFFERENTIAL/PLATELET
BASOS PCT: 1 %
Basophils Absolute: 0 10*3/uL (ref 0–0.1)
EOS ABS: 0.2 10*3/uL (ref 0–0.7)
Eosinophils Relative: 4 %
HCT: 34.5 % — ABNORMAL LOW (ref 35.0–47.0)
Hemoglobin: 10.8 g/dL — ABNORMAL LOW (ref 12.0–16.0)
Lymphocytes Relative: 26 %
Lymphs Abs: 1.5 10*3/uL (ref 1.0–3.6)
MCH: 27.5 pg (ref 26.0–34.0)
MCHC: 31.3 g/dL — AB (ref 32.0–36.0)
MCV: 87.6 fL (ref 80.0–100.0)
Monocytes Absolute: 0.4 10*3/uL (ref 0.2–0.9)
Monocytes Relative: 6 %
NEUTROS PCT: 63 %
Neutro Abs: 3.7 10*3/uL (ref 1.4–6.5)
PLATELETS: 207 10*3/uL (ref 150–440)
RBC: 3.94 MIL/uL (ref 3.80–5.20)
RDW: 14.1 % (ref 11.5–14.5)
WBC: 5.9 10*3/uL (ref 3.6–11.0)

## 2015-06-16 LAB — CANCER ANTIGEN 27.29: CA 27.29: 17.5 U/mL (ref 0.0–38.6)

## 2015-06-23 ENCOUNTER — Ambulatory Visit
Admission: RE | Admit: 2015-06-23 | Discharge: 2015-06-23 | Disposition: A | Discharge status: Home / Self Care | Payer: Medicare Other | Source: Ambulatory Visit | Attending: Internal Medicine | Admitting: Internal Medicine

## 2015-06-23 DIAGNOSIS — Z853 Personal history of malignant neoplasm of breast: Secondary | ICD-10-CM | POA: Insufficient documentation

## 2015-06-23 DIAGNOSIS — Z13820 Encounter for screening for osteoporosis: Secondary | ICD-10-CM | POA: Diagnosis present

## 2015-06-23 DIAGNOSIS — M8580 Other specified disorders of bone density and structure, unspecified site: Secondary | ICD-10-CM | POA: Insufficient documentation

## 2015-06-27 NOTE — Progress Notes (Signed)
[place]  [phone] [phone]) [phone]     ID: Jessica Compton OB: 02-20-1941  MR#: [id number]  UYQ#:[id number]  Patient Care Team: [name], MD as PCP - General (Internal Medicine) [name], MD (General Surgery) Marden Noble, MD (Internal Medicine)  CHIEF COMPLAINT/DIAGNOSIS:  Recurrent cT2cN0cM0 (post-mastectomy chest wall recurrence) left breast invasive mammary carcinoma diagnosed by chest wall mass biopsy on 01/01/14.  ER positive (> 90%), PR negative (0%),  HER-2/neu negative by in situ hybridization, HER-2/CEP17 ratio 1.09.  Underwent surgical excision left chest wall mass on 01/26/14 with surgical pathology reporting metastatic carcinoma consistent with mammary carcinoma, involving skeletal muscle and adipose tissue. Margins are clear.   (initially had T2N35m, grade 1-2 invasive ductal carcinoma of left breast s/p mastectomy and SLN study on 01/11/06.  Tumor size 3.5 x 3.2 cm, margins negative. ER positive at 80%. PR negative. HER-2/neu negative by FISH. 1 of 2 sentinel nodes positive for micro metastasis, 0.5 mm focus. Completed AC x 4 followed by Taxane for 12 weeks last dose 07/19/06. Started Femara 2.5 mg daily in early August 2007 but patient stopped after may 2009 and did not come for followup).  Completed radiation therapy on 05/07/14. Restarted on hormonal therapy with Anastrozole in May 2015.  HISTORY OF PRESENT ILLNESS:  Patient returns for continued oncology evaluation, she missed last scheduled followup appt. States that she is doing steady, and has been taking anastrozole regularly. Eating well, has gained 5 lbs.  Chronic fatigue on exertion is unchanged, tries to remain physically active. No new cough, dyspnea or chest pain. No new bone pains. No new headaches, imbalance or falls. Denies major arthritis symptoms. Denies history of osteoporosis. Denies new breast masses on self-exam. Denies new pain issues, 0/10.  REVIEW OF  SYSTEMS:   ROS As in HPI above. In addition, no fever, chills or sweats. No new headaches or focal weakness.  No new mood disturbances. No  sore throat, cough, shortness of breath, sputum, hemoptysis or chest pain. No dizziness or palpitation. No abdominal pain, constipation, diarrhea, dysuria or hematuria. No new skin rash or bleeding symptoms. No new paresthesias in extremities. PS ECOG 0.  PAST MEDICAL HISTORY: Reviewed. Past Medical History  Diagnosis Date  . Diabetes mellitus without complication   . Hypertension     since age 89 . Personal history of tobacco use, presenting hazards to health   . Arthritis   . Other benign neoplasm of connective and other soft tissue of trunk, unspecified 2014  . Special screening for malignant neoplasms, colon   . Aneurysm 2007  . Hyperlipidemia   . Unspecified disorder of skin and subcutaneous tissue 02/19/2013    biopsy of skin over the sternum on the right breast showed hypertrophic scar,keloid formation.  . Personal history of colonic polyps   . Cancer 2007    diagnosed 01/07 left breast with simple mastectomu & sn bx. The pt had a 3.5cm tumor. Frozen section report on the 2 sn were negative. On permanent sections a 0.551mmicrometastasis was identified. She was not felt to require a complete axillary dissection. She has completed her Tamoxifen therapy and has been released from routine f/u with medical oncology service.  . Malignant neoplasm of upper-outer quadrant of female breast January 2015    Mastectomy site recurrence resected 01/26/2014, ER 90%, PR 0%, HER-2/neu nonamplified.          Hypertension.  Arthritis in back and lower extremities, chronic.  Hysterectomy many years  ago.  Left breast mastectomy and sentinel node study on 01/11/06 as described above.         Colonoscopy Apr 2016 - Tubular adenoma x 1.           Family History:  Positive for diabetes and hypertension. No family history of breast, colon, or ovarian  malignancies.    Social History:      Chronic smoker for the last 40 years or so. No history of regular alcohol use.  PAST SURGICAL HISTORY: Reviewed. Past Surgical History  Procedure Laterality Date  . Colonoscopy w/ polypectomy  2011    Dr. Brynda Greathouse  . Abdominal hysterectomy  2004    total  . Portacath placement  2007  . Port-a-cath removal  2008  . Cholecystectomy  2007  . Mastectomy Left 2007  . Head surgery  1991  . Eye surgery Right     cataract surgery  . Chest wall mass  01/26/14  . Breast surgery Left 2007    mastectomy  . Breast biopsy Right January 26, 2014    Core biopsy for mild increase breast uptake on PET scan, usual ductal hyperplasia and stromal fibrosis    FAMILY HISTORY: Reviewed. Family History  Problem Relation Age of Onset  . Cancer Other     ovarian,breast,colon cancers;relations not listed    ADVANCED DIRECTIVES:  <no information>  SOCIAL HISTORY: Reviewed. History  Substance Use Topics  . Smoking status: Current Some Day Smoker -- 1.00 packs/day for 50 years    Types: Cigarettes  . Smokeless tobacco: Never Used  . Alcohol Use: No    Allergies  Allergen Reactions  . Metoprolol     Dizziness and fatigue    Current Outpatient Prescriptions  Medication Sig Dispense Refill  . anastrozole (ARIMIDEX) 1 MG tablet TAKE ONE (1) TABLET EACH DAY 30 tablet 11  . atorvastatin (LIPITOR) 20 MG tablet     . citalopram (CELEXA) 10 MG tablet Take 1 tablet by mouth daily.    . hydrochlorothiazide (MICROZIDE) 12.5 MG capsule Take 1 capsule by mouth daily.    Marland Kitchen lisinopril (PRINIVIL,ZESTRIL) 5 MG tablet Take 1 tablet by mouth daily.    . polyethylene glycol powder (GLYCOLAX/MIRALAX) powder 255 grams one bottle for colonoscopy prep 255 g 0   No current facility-administered medications for this visit.    PHYSICAL EXAM: Filed Vitals:   06/15/15 1207  BP: 143/79  Pulse: 70  Temp: 97.1 F (36.2 C)     Body mass index is 23.32 kg/(m^2).    ECOG FS:0 -  Asymptomatic  GENERAL: Patient is alert and oriented and in no acute distress. There is no icterus. HEENT: EOMs intact. No cervical lymphadenopathy. CVS: S1S2, regular LUNGS: Bilaterally clear to auscultation, no rhonchi. ABDOMEN: Soft, nontender. No hepatomegaly clinically.  EXTREMITIES: No pedal edema. BREASTS: no dominant masses or nodules in right breast or left chest wall. No axillary adenopathy on either side. Exam performed in presence of a nurse.   LAB RESULTS: CA 27.29 normal at 17.5.     Component Value Date/Time   NA 139 05/30/2014 0127   K 3.7 05/30/2014 0127   CL 106 05/30/2014 0127   CO2 25 05/30/2014 0127   GLUCOSE 136* 05/30/2014 0127   BUN 19* 05/30/2014 0127   CREATININE 1.00 06/15/2015 1111   CREATININE 0.98 05/30/2014 0127   CALCIUM 9.0 05/30/2014 0127   PROT 7.6 06/15/2015 1111   PROT 7.8 05/08/2014 1131   ALBUMIN 3.9 06/15/2015 1111   ALBUMIN 3.7  05/08/2014 1131   AST 24 06/15/2015 1111   AST 20 05/08/2014 1131   ALT 13* 06/15/2015 1111   ALT 18 05/08/2014 1131   ALKPHOS 75 06/15/2015 1111   ALKPHOS 58 05/08/2014 1131   BILITOT 0.6 06/15/2015 1111   GFRNONAA 55* 06/15/2015 1111   GFRNONAA 58* 05/30/2014 0127   GFRAA >60 06/15/2015 1111   GFRAA >60 05/30/2014 0127   Lab Results  Component Value Date   WBC 5.9 06/15/2015   NEUTROABS 3.7 06/15/2015   HGB 10.8* 06/15/2015   HCT 34.5* 06/15/2015   MCV 87.6 06/15/2015   PLT 207 06/15/2015     STUDIES: 01/08/14 - serum CA 27.29 level is 14.3. Serum CEA is 5.3.  01/26/14 - Surgical Pathology Report.   Diagnosis: Part A: RIGHT BREAST, CORE BIOPSY: - FOCAL USUAL DUCTAL HYPERPLASIA AND STROMAL FIBROSIS. - NEGATIVE FOR ATYPIA AND MALIGNANCY. Part B: SOFT TISSUE MASS, LEFT CHEST WALL, EXCISION: - METASTATIC CARCINOMA CONSISTENT WITH MAMMARY CARCINOMA,INVOLVING SKELETAL MUSCLE AND ADIPOSE TISSUE. - BIOPSY SITE CHANGES. - ALL MARGINS APPEAR CLEAR. Ancillary testing was previously performed on a core  biopsy of this left chest wall recurrent breast carcinoma done 01/02/14 and in summary: Estrogen receptor IHC(SP1): Positive, >90%, strong staining.  Progesterone receptor IHC(1E2): Negative. HER2 IHC(HercepTest): Negative, HER2/CEP17 ratio 1.09.  03/03/15 - Right mammogram reported as BIRADS-3, probably benign. 54-monthfollowup recommended.   ASSESSMENT / PLAN:   1. Recurrent cT2cN0cM0 (post-mastectomy chest wall recurrence) left breast invasive mammary carcinoma diagnosed by chest wall mass biopsy on 01/01/14.  ER positive (> 90%), PR negative (0%),  HER-2/neu negative by in situ hybridization, HER-2/CEP17 ratio 1.09.    (initially had T2N1 (mi) grade 1-2 invasive ductal carcinoma of left breast s/p mastectomy and SLN study on 01/11/06.  Tumor size 3.5 x 3.2 cm, margins negative. ER positive at 80%. PR negative. HER-2/neu negative by FISH. 1 of 2 sentinel nodes positive for micro metastasis, 0.5 mm focus. Completed AC x 4 followed by Taxane for 12 weeks last dose 07/19/06. Started Femara 2.5 mg daily in early August 2007 but patient stopped after may 2009 and did not come for followup). PET scan January 2015 did not show any other metastatic disease. Status post surgical resection of left chest wall lesion which confirms recurrent metastatic mammary carcinoma with clear margins. Completed radiation therapy on 05/07/14. Restarted on hormonal therapy with Anastrozole in May 2015.   -    Have reviewed labs from today and d/w patient. No clinical evidence to suggest recurrent/metastatic breast cancer. Right mammogram on 03/03/15 reported as BIRADS-3, probably benign, 629-monthollowup recommended.Patient states next mammogram has been scheduled. She is on Anastrozole, continue 1 mg PO daily. Patient understands that there is a high risk of recurrent metastatic disease in the future also. Will f/u in about 6 months with labs including CBC, Cr, LFT, CA 27.29. 2. Osteoporosis surveillance - will request DEXA scan since  she is on high-risk medication. 3. In between visits, the patient has been advised to call in case of new symptoms or side effects from anastrozole. She is agreeable to this plan.    SaLeia AlfMD   06/27/2015 8:12 PM

## 2015-09-22 ENCOUNTER — Encounter: Payer: Self-pay | Admitting: General Surgery

## 2015-11-24 DIAGNOSIS — E119 Type 2 diabetes mellitus without complications: Secondary | ICD-10-CM | POA: Insufficient documentation

## 2015-11-24 DIAGNOSIS — Z720 Tobacco use: Secondary | ICD-10-CM | POA: Insufficient documentation

## 2015-11-29 ENCOUNTER — Other Ambulatory Visit: Payer: Self-pay | Admitting: *Deleted

## 2015-11-29 DIAGNOSIS — Z853 Personal history of malignant neoplasm of breast: Secondary | ICD-10-CM

## 2015-12-01 ENCOUNTER — Inpatient Hospital Stay: Payer: Medicare Other | Admitting: Internal Medicine

## 2015-12-01 ENCOUNTER — Inpatient Hospital Stay: Payer: Medicare Other

## 2015-12-15 ENCOUNTER — Inpatient Hospital Stay: Payer: Medicare Other

## 2015-12-15 ENCOUNTER — Inpatient Hospital Stay: Payer: Medicare Other | Admitting: Internal Medicine

## 2016-01-12 ENCOUNTER — Inpatient Hospital Stay: Payer: Medicare Other | Attending: Internal Medicine

## 2016-01-12 ENCOUNTER — Inpatient Hospital Stay (HOSPITAL_BASED_OUTPATIENT_CLINIC_OR_DEPARTMENT_OTHER): Payer: Medicare Other | Admitting: Internal Medicine

## 2016-01-12 ENCOUNTER — Encounter: Payer: Self-pay | Admitting: Internal Medicine

## 2016-01-12 VITALS — BP 142/74 | HR 74 | Temp 97.7°F | Ht 68.4 in | Wt 147.9 lb

## 2016-01-12 DIAGNOSIS — Z8041 Family history of malignant neoplasm of ovary: Secondary | ICD-10-CM | POA: Diagnosis not present

## 2016-01-12 DIAGNOSIS — C792 Secondary malignant neoplasm of skin: Secondary | ICD-10-CM | POA: Diagnosis not present

## 2016-01-12 DIAGNOSIS — Z79899 Other long term (current) drug therapy: Secondary | ICD-10-CM | POA: Diagnosis not present

## 2016-01-12 DIAGNOSIS — Z170 Estrogen receptor positive status [ER+]: Secondary | ICD-10-CM | POA: Insufficient documentation

## 2016-01-12 DIAGNOSIS — I10 Essential (primary) hypertension: Secondary | ICD-10-CM

## 2016-01-12 DIAGNOSIS — Z9012 Acquired absence of left breast and nipple: Secondary | ICD-10-CM

## 2016-01-12 DIAGNOSIS — Z86010 Personal history of colonic polyps: Secondary | ICD-10-CM | POA: Insufficient documentation

## 2016-01-12 DIAGNOSIS — M8580 Other specified disorders of bone density and structure, unspecified site: Secondary | ICD-10-CM | POA: Insufficient documentation

## 2016-01-12 DIAGNOSIS — E785 Hyperlipidemia, unspecified: Secondary | ICD-10-CM | POA: Insufficient documentation

## 2016-01-12 DIAGNOSIS — Z800 Family history of malignant neoplasm of digestive organs: Secondary | ICD-10-CM | POA: Diagnosis not present

## 2016-01-12 DIAGNOSIS — E119 Type 2 diabetes mellitus without complications: Secondary | ICD-10-CM | POA: Diagnosis not present

## 2016-01-12 DIAGNOSIS — C50412 Malignant neoplasm of upper-outer quadrant of left female breast: Secondary | ICD-10-CM | POA: Insufficient documentation

## 2016-01-12 DIAGNOSIS — Z853 Personal history of malignant neoplasm of breast: Secondary | ICD-10-CM

## 2016-01-12 DIAGNOSIS — C50912 Malignant neoplasm of unspecified site of left female breast: Secondary | ICD-10-CM

## 2016-01-12 DIAGNOSIS — M1990 Unspecified osteoarthritis, unspecified site: Secondary | ICD-10-CM | POA: Diagnosis not present

## 2016-01-12 DIAGNOSIS — Z803 Family history of malignant neoplasm of breast: Secondary | ICD-10-CM | POA: Diagnosis not present

## 2016-01-12 DIAGNOSIS — Z79811 Long term (current) use of aromatase inhibitors: Secondary | ICD-10-CM | POA: Insufficient documentation

## 2016-01-12 LAB — HEPATIC FUNCTION PANEL
ALBUMIN: 4.2 g/dL (ref 3.5–5.0)
ALT: 13 U/L — AB (ref 14–54)
AST: 21 U/L (ref 15–41)
Alkaline Phosphatase: 71 U/L (ref 38–126)
Bilirubin, Direct: <0.1 mg/dL — ABNORMAL LOW (ref 0.1–0.5)
Total Bilirubin: 0.7 mg/dL (ref 0.3–1.2)
Total Protein: 7.7 g/dL (ref 6.5–8.1)

## 2016-01-12 LAB — CBC WITH DIFFERENTIAL/PLATELET
BASOS ABS: 0 10*3/uL (ref 0–0.1)
Basophils Relative: 0 %
Eosinophils Absolute: 0.3 10*3/uL (ref 0–0.7)
Eosinophils Relative: 4 %
HEMATOCRIT: 34.2 % — AB (ref 35.0–47.0)
HEMOGLOBIN: 11.1 g/dL — AB (ref 12.0–16.0)
LYMPHS PCT: 31 %
Lymphs Abs: 2 10*3/uL (ref 1.0–3.6)
MCH: 27.7 pg (ref 26.0–34.0)
MCHC: 32.3 g/dL (ref 32.0–36.0)
MCV: 85.7 fL (ref 80.0–100.0)
MONO ABS: 0.5 10*3/uL (ref 0.2–0.9)
Monocytes Relative: 8 %
NEUTROS ABS: 3.6 10*3/uL (ref 1.4–6.5)
Neutrophils Relative %: 57 %
Platelets: 246 10*3/uL (ref 150–440)
RBC: 3.99 MIL/uL (ref 3.80–5.20)
RDW: 14.9 % — AB (ref 11.5–14.5)
WBC: 6.3 10*3/uL (ref 3.6–11.0)

## 2016-01-12 LAB — CREATININE, SERUM
Creatinine, Ser: 1.1 mg/dL — ABNORMAL HIGH (ref 0.44–1.00)
GFR calc non Af Amer: 49 mL/min — ABNORMAL LOW (ref >60)
GFR, EST AFRICAN AMERICAN: 56 mL/min — AB (ref >60)

## 2016-01-12 NOTE — Progress Notes (Signed)
[place] OFFICE PROGRESS NOTE  Patient Care Team: [name], MD as PCP - General (Internal Medicine) [name], MD (General Surgery) Marden Noble, MD (Internal Medicine)   SUMMARY OF ONCOLOGIC HISTORY:  # 2007- LEFT BREAST CA STAGE II [T2N10m; s/p mastec; Dr.Byrnett] ER-Pos/PR Neg; Her- 2 Neu- NEG; ACx4-Taxol x12;Femara; stopped May 2009-stopped follow up.  #JAN 2015- Post mastectomy Chest wall recurrence [chest wall mass bx-]; ER- 90%; PR- NEG; Her2 Neu- NEG; s/p excision [involving skeletal muscle/adipose tissue; clear margins]  # OSTEOPENIA [BMD- June 2016]   INTERVAL HISTORY:  This is my first interaction with the patient since I joined the practice September 2016. I reviewed the patient's prior charts/pertinent labs/imaging in detail; findings are summarized above.   A very pleasant 75-year old African-American female patient with above history of ER/PR positive breast cancer status with chest wall recurrence in January 2015 currently on Arimidex since for follow-up.  Patient's appetite is good. She is not losing any weight. She denies any unusual bone pain. No nausea no vomiting abdominal pain.  No unusual chest pain cough or shortness of breath.    REVIEW OF SYSTEMS:  A complete 10 point review of system is done which is negative except mentioned above/history of present illness.   PAST MEDICAL HISTORY :  Past Medical History  Diagnosis Date  . Diabetes mellitus without complication   . Hypertension     since age 75 . Personal history of tobacco use, presenting hazards to health   . Arthritis   . Other benign neoplasm of connective and other soft tissue of trunk, unspecified 2014  . Special screening for malignant neoplasms, colon   . Aneurysm 2007  . Hyperlipidemia   . Unspecified disorder of skin and subcutaneous tissue 02/19/2013    biopsy of skin over the sternum on the right breast showed hypertrophic scar,keloid formation.   . Personal history of colonic polyps   . Cancer 2007    diagnosed 01/07 left breast with simple mastectomu & sn bx. The pt had a 3.5cm tumor. Frozen section report on the 2 sn were negative. On permanent sections a 0.537mmicrometastasis was identified. She was not felt to require a complete axillary dissection. She has completed her Tamoxifen therapy and has been released from routine f/u with medical oncology service.  . Malignant neoplasm of upper-outer quadrant of female breast January 2015    Mastectomy site recurrence resected 01/26/2014, ER 90%, PR 0%, HER-2/neu nonamplified.    PAST SURGICAL HISTORY :   Past Surgical History  Procedure Laterality Date  . Colonoscopy w/ polypectomy  2011    Dr. EaBrynda Greathouse. Abdominal hysterectomy  2004    total  . Portacath placement  2007  . Port-a-cath removal  2008  . Cholecystectomy  2007  . Mastectomy Left 2007  . Head surgery  1991  . Eye surgery Right     cataract surgery  . Chest wall mass  01/26/14  . Breast surgery Left 2007    mastectomy  . Breast biopsy Right January 26, 2014    Core biopsy for mild increase breast uptake on PET scan, usual ductal hyperplasia and stromal fibrosis    FAMILY HISTORY :   Family History  Problem Relation Age of Onset  . Cancer Other     ovarian,breast,colon cancers;relations not listed    SOCIAL HISTORY:   Social History  Substance Use Topics  . Smoking status: Current Some Day Smoker -- 1.00 packs/day for  50 years    Types: Cigarettes  . Smokeless tobacco: Never Used  . Alcohol Use: No    ALLERGIES:  is allergic to metoprolol.  MEDICATIONS:  Current Outpatient Prescriptions  Medication Sig Dispense Refill  . anastrozole (ARIMIDEX) 1 MG tablet TAKE ONE (1) TABLET EACH DAY 30 tablet 11  . atorvastatin (LIPITOR) 20 MG tablet     . Calcium Carbonate-Vitamin D 600-200 MG-UNIT CAPS Take 200 capsules by mouth once.    . citalopram (CELEXA) 10 MG tablet Take 1 tablet by mouth daily.    .  hydrochlorothiazide (MICROZIDE) 12.5 MG capsule Take 1 capsule by mouth daily.    Marland Kitchen lisinopril (PRINIVIL,ZESTRIL) 5 MG tablet Take 1 tablet by mouth daily.    . polyethylene glycol powder (GLYCOLAX/MIRALAX) powder 255 grams one bottle for colonoscopy prep 255 g 0   No current facility-administered medications for this visit.    PHYSICAL EXAMINATION: ECOG PERFORMANCE STATUS: 0 - Asymptomatic  BP 142/74 mmHg  Pulse 74  Temp(Src) 97.7 F (36.5 C) (Tympanic)  Ht 5' 8.4" (1.737 m)  Wt 147 lb 14.9 oz (67.1 kg)  BMI 22.24 kg/m2  Filed Weights   01/12/16 1157  Weight: 147 lb 14.9 oz (67.1 kg)    GENERAL: Well-nourished well-developed; Alert, no distress and comfortable.  Accompanied by her daughter. She is able to sit on the exam table with minimal help.  EYES: no pallor or icterus OROPHARYNX: no thrush or ulceration; good dentition  NECK: supple, no masses felt LYMPH:  no palpable lymphadenopathy in the cervical, axillary or inguinal regions LUNGS: clear to auscultation and  No wheeze or crackles HEART/CVS: regular rate & rhythm and no murmurs; No lower extremity edema ABDOMEN:abdomen soft, non-tender and normal bowel sounds Musculoskeletal:no cyanosis of digits and no clubbing  PSYCH: alert & oriented x 3 with fluent speech NEURO: no focal motor/sensory deficits SKIN/chest wall:  no rashes or significant lesions-left chest wall mastectomy incision; no new lumps. Right breast exam- no dominant masses felt/no skin changes. [In presence of the nurse]  LABORATORY DATA:  I have reviewed the data as listed    Component Value Date/Time   NA 139 05/30/2014 0127   K 3.7 05/30/2014 0127   CL 106 05/30/2014 0127   CO2 25 05/30/2014 0127   GLUCOSE 136* 05/30/2014 0127   BUN 19* 05/30/2014 0127   CREATININE 1.00 06/15/2015 1111   CREATININE 0.98 05/30/2014 0127   CALCIUM 9.0 05/30/2014 0127   PROT 7.6 06/15/2015 1111   PROT 7.8 05/08/2014 1131   ALBUMIN 3.9 06/15/2015 1111   ALBUMIN  3.7 05/08/2014 1131   AST 24 06/15/2015 1111   AST 20 05/08/2014 1131   ALT 13* 06/15/2015 1111   ALT 18 05/08/2014 1131   ALKPHOS 75 06/15/2015 1111   ALKPHOS 58 05/08/2014 1131   BILITOT 0.6 06/15/2015 1111   BILITOT 0.4 05/08/2014 1131   GFRNONAA 55* 06/15/2015 1111   GFRNONAA 58* 05/30/2014 0127   GFRAA >60 06/15/2015 1111   GFRAA >60 05/30/2014 0127    No results found for: SPEP, UPEP  Lab Results  Component Value Date   WBC 6.3 01/12/2016   NEUTROABS 3.6 01/12/2016   HGB 11.1* 01/12/2016   HCT 34.2* 01/12/2016   MCV 85.7 01/12/2016   PLT 246 01/12/2016      Chemistry      Component Value Date/Time   NA 139 05/30/2014 0127   K 3.7 05/30/2014 0127   CL 106 05/30/2014 0127   CO2 25 05/30/2014  0127   BUN 19* 05/30/2014 0127   CREATININE 1.00 06/15/2015 1111   CREATININE 0.98 05/30/2014 0127      Component Value Date/Time   CALCIUM 9.0 05/30/2014 0127   ALKPHOS 75 06/15/2015 1111   ALKPHOS 58 05/08/2014 1131   AST 24 06/15/2015 1111   AST 20 05/08/2014 1131   ALT 13* 06/15/2015 1111   ALT 18 05/08/2014 1131   BILITOT 0.6 06/15/2015 1111   BILITOT 0.4 05/08/2014 1131       RADIOGRAPHIC STUDIES: I have personally reviewed the radiological images as listed and agreed with the findings in the report. No results found.   ASSESSMENT & PLAN:   # Left breast cancer with chest wall ipsilateral recurrence-  ER positive HER-2/neu negative- status post excision followed by radiation. Patient is currently on Arimidex; tolerating it well.  # Clinically no evidence of recurrence noted; however I would recommend CT scan chest abdomen pelvis in approximately 6 months for a follow-up.  # Osteopenia in the bone density test- recommend repeat bone density test in 6 months.  # Follow-up with me in 6 months CBC CMP scans; Dexa scan  the next visit.  # 15 minutes face-to-face with the patient discussing the above plan of care; more than 50% of time spent on prognosis/  natural history; counseling and coordination.      [name], MD 01/12/2016 12:03 PM

## 2016-01-12 NOTE — Patient Instructions (Signed)

## 2016-01-13 LAB — CANCER ANTIGEN 27.29: CA 27.29: 21.2 U/mL (ref 0.0–38.6)

## 2016-01-24 ENCOUNTER — Ambulatory Visit
Admission: RE | Admit: 2016-01-24 | Discharge: 2016-01-24 | Disposition: A | Discharge status: Home / Self Care | Payer: Medicare Other | Source: Ambulatory Visit | Attending: Internal Medicine | Admitting: Internal Medicine

## 2016-01-24 DIAGNOSIS — Z13820 Encounter for screening for osteoporosis: Secondary | ICD-10-CM | POA: Diagnosis not present

## 2016-01-24 DIAGNOSIS — C50912 Malignant neoplasm of unspecified site of left female breast: Secondary | ICD-10-CM

## 2016-01-24 DIAGNOSIS — Z853 Personal history of malignant neoplasm of breast: Secondary | ICD-10-CM

## 2016-01-24 DIAGNOSIS — M8580 Other specified disorders of bone density and structure, unspecified site: Secondary | ICD-10-CM

## 2016-01-24 DIAGNOSIS — Z85828 Personal history of other malignant neoplasm of skin: Secondary | ICD-10-CM | POA: Diagnosis not present

## 2016-01-24 DIAGNOSIS — C792 Secondary malignant neoplasm of skin: Secondary | ICD-10-CM

## 2016-03-09 ENCOUNTER — Ambulatory Visit: Payer: Medicare Other | Admitting: General Surgery

## 2016-05-29 ENCOUNTER — Other Ambulatory Visit: Payer: Self-pay | Admitting: *Deleted

## 2016-05-29 MED ORDER — ANASTROZOLE 1 MG PO TABS: ORAL_TABLET | ORAL | Status: DC

## 2016-07-07 ENCOUNTER — Ambulatory Visit
Admission: RE | Admit: 2016-07-07 | Discharge: 2016-07-07 | Disposition: A | Discharge status: Home / Self Care | Payer: Medicare Other | Source: Ambulatory Visit | Attending: Internal Medicine | Admitting: Internal Medicine

## 2016-07-07 DIAGNOSIS — Z853 Personal history of malignant neoplasm of breast: Secondary | ICD-10-CM | POA: Diagnosis not present

## 2016-07-07 DIAGNOSIS — C792 Secondary malignant neoplasm of skin: Secondary | ICD-10-CM | POA: Diagnosis not present

## 2016-07-07 DIAGNOSIS — I700 Atherosclerosis of aorta: Secondary | ICD-10-CM | POA: Insufficient documentation

## 2016-07-07 DIAGNOSIS — C50912 Malignant neoplasm of unspecified site of left female breast: Secondary | ICD-10-CM | POA: Diagnosis present

## 2016-07-07 DIAGNOSIS — M954 Acquired deformity of chest and rib: Secondary | ICD-10-CM | POA: Diagnosis not present

## 2016-07-07 DIAGNOSIS — I77810 Thoracic aortic ectasia: Secondary | ICD-10-CM | POA: Insufficient documentation

## 2016-07-07 DIAGNOSIS — Q638 Other specified congenital malformations of kidney: Secondary | ICD-10-CM | POA: Insufficient documentation

## 2016-07-07 DIAGNOSIS — I2510 Atherosclerotic heart disease of native coronary artery without angina pectoris: Secondary | ICD-10-CM | POA: Diagnosis not present

## 2016-07-07 DIAGNOSIS — J432 Centrilobular emphysema: Secondary | ICD-10-CM | POA: Insufficient documentation

## 2016-07-07 DIAGNOSIS — N289 Disorder of kidney and ureter, unspecified: Secondary | ICD-10-CM | POA: Insufficient documentation

## 2016-07-07 DIAGNOSIS — K5730 Diverticulosis of large intestine without perforation or abscess without bleeding: Secondary | ICD-10-CM | POA: Diagnosis not present

## 2016-07-07 DIAGNOSIS — I708 Atherosclerosis of other arteries: Secondary | ICD-10-CM | POA: Insufficient documentation

## 2016-07-07 DIAGNOSIS — M8580 Other specified disorders of bone density and structure, unspecified site: Secondary | ICD-10-CM | POA: Insufficient documentation

## 2016-07-07 DIAGNOSIS — N200 Calculus of kidney: Secondary | ICD-10-CM | POA: Insufficient documentation

## 2016-07-07 DIAGNOSIS — N2882 Megaloureter: Secondary | ICD-10-CM | POA: Insufficient documentation

## 2016-07-07 DIAGNOSIS — M47896 Other spondylosis, lumbar region: Secondary | ICD-10-CM | POA: Diagnosis not present

## 2016-07-07 DIAGNOSIS — Z9049 Acquired absence of other specified parts of digestive tract: Secondary | ICD-10-CM | POA: Insufficient documentation

## 2016-07-07 DIAGNOSIS — M5137 Other intervertebral disc degeneration, lumbosacral region: Secondary | ICD-10-CM | POA: Insufficient documentation

## 2016-07-14 ENCOUNTER — Ambulatory Visit: Payer: Medicare Other | Admitting: Internal Medicine

## 2016-07-14 ENCOUNTER — Other Ambulatory Visit: Payer: Medicare Other

## 2016-07-18 ENCOUNTER — Encounter: Payer: Self-pay | Admitting: *Deleted

## 2016-07-24 ENCOUNTER — Inpatient Hospital Stay: Payer: Medicare Other | Attending: Internal Medicine | Admitting: Internal Medicine

## 2016-07-24 ENCOUNTER — Inpatient Hospital Stay: Payer: Medicare Other

## 2016-07-24 DIAGNOSIS — Z79811 Long term (current) use of aromatase inhibitors: Secondary | ICD-10-CM | POA: Insufficient documentation

## 2016-07-24 DIAGNOSIS — M1990 Unspecified osteoarthritis, unspecified site: Secondary | ICD-10-CM | POA: Insufficient documentation

## 2016-07-24 DIAGNOSIS — M8580 Other specified disorders of bone density and structure, unspecified site: Secondary | ICD-10-CM

## 2016-07-24 DIAGNOSIS — F17210 Nicotine dependence, cigarettes, uncomplicated: Secondary | ICD-10-CM | POA: Diagnosis not present

## 2016-07-24 DIAGNOSIS — Z9012 Acquired absence of left breast and nipple: Secondary | ICD-10-CM | POA: Insufficient documentation

## 2016-07-24 DIAGNOSIS — Z8041 Family history of malignant neoplasm of ovary: Secondary | ICD-10-CM | POA: Insufficient documentation

## 2016-07-24 DIAGNOSIS — C792 Secondary malignant neoplasm of skin: Secondary | ICD-10-CM

## 2016-07-24 DIAGNOSIS — I10 Essential (primary) hypertension: Secondary | ICD-10-CM | POA: Insufficient documentation

## 2016-07-24 DIAGNOSIS — E876 Hypokalemia: Secondary | ICD-10-CM | POA: Diagnosis not present

## 2016-07-24 DIAGNOSIS — Z170 Estrogen receptor positive status [ER+]: Secondary | ICD-10-CM | POA: Diagnosis not present

## 2016-07-24 DIAGNOSIS — E786 Lipoprotein deficiency: Secondary | ICD-10-CM

## 2016-07-24 DIAGNOSIS — E785 Hyperlipidemia, unspecified: Secondary | ICD-10-CM | POA: Diagnosis not present

## 2016-07-24 DIAGNOSIS — M545 Low back pain: Secondary | ICD-10-CM

## 2016-07-24 DIAGNOSIS — E119 Type 2 diabetes mellitus without complications: Secondary | ICD-10-CM

## 2016-07-24 DIAGNOSIS — C50812 Malignant neoplasm of overlapping sites of left female breast: Secondary | ICD-10-CM | POA: Insufficient documentation

## 2016-07-24 DIAGNOSIS — Z803 Family history of malignant neoplasm of breast: Secondary | ICD-10-CM | POA: Insufficient documentation

## 2016-07-24 DIAGNOSIS — Z800 Family history of malignant neoplasm of digestive organs: Secondary | ICD-10-CM | POA: Diagnosis not present

## 2016-07-24 DIAGNOSIS — C50912 Malignant neoplasm of unspecified site of left female breast: Secondary | ICD-10-CM

## 2016-07-24 DIAGNOSIS — Z853 Personal history of malignant neoplasm of breast: Secondary | ICD-10-CM | POA: Insufficient documentation

## 2016-07-24 LAB — COMPREHENSIVE METABOLIC PANEL
ALT: 12 U/L — AB (ref 14–54)
AST: 23 U/L (ref 15–41)
Albumin: 4.1 g/dL (ref 3.5–5.0)
Alkaline Phosphatase: 65 U/L (ref 38–126)
Anion gap: 9 (ref 5–15)
BUN: 19 mg/dL (ref 6–20)
CHLORIDE: 100 mmol/L — AB (ref 101–111)
CO2: 26 mmol/L (ref 22–32)
CREATININE: 1.15 mg/dL — AB (ref 0.44–1.00)
Calcium: 8.9 mg/dL (ref 8.9–10.3)
GFR, EST AFRICAN AMERICAN: 53 mL/min — AB (ref >60)
GFR, EST NON AFRICAN AMERICAN: 46 mL/min — AB (ref >60)
Glucose, Bld: 155 mg/dL — ABNORMAL HIGH (ref 65–99)
POTASSIUM: 3.3 mmol/L — AB (ref 3.5–5.1)
SODIUM: 135 mmol/L (ref 135–145)
Total Bilirubin: 0.9 mg/dL (ref 0.3–1.2)
Total Protein: 7.8 g/dL (ref 6.5–8.1)

## 2016-07-24 LAB — CBC WITH DIFFERENTIAL/PLATELET
BASOS ABS: 0 10*3/uL (ref 0–0.1)
Basophils Relative: 1 %
EOS ABS: 0.2 10*3/uL (ref 0–0.7)
EOS PCT: 4 %
HCT: 33.9 % — ABNORMAL LOW (ref 35.0–47.0)
Hemoglobin: 11.2 g/dL — ABNORMAL LOW (ref 12.0–16.0)
Lymphocytes Relative: 28 %
Lymphs Abs: 1.6 10*3/uL (ref 1.0–3.6)
MCH: 29.2 pg (ref 26.0–34.0)
MCHC: 33.1 g/dL (ref 32.0–36.0)
MCV: 88 fL (ref 80.0–100.0)
MONO ABS: 0.5 10*3/uL (ref 0.2–0.9)
Monocytes Relative: 8 %
Neutro Abs: 3.4 10*3/uL (ref 1.4–6.5)
Neutrophils Relative %: 59 %
PLATELETS: 248 10*3/uL (ref 150–440)
RBC: 3.85 MIL/uL (ref 3.80–5.20)
RDW: 14.3 % (ref 11.5–14.5)
WBC: 5.8 10*3/uL (ref 3.6–11.0)

## 2016-07-24 NOTE — Progress Notes (Addendum)
[place] OFFICE PROGRESS NOTE  Patient Care Team: [name], MD as PCP - General (Internal Medicine) [name], MD (General Surgery) Marden Noble, MD (Internal Medicine)  Dr.Charles Mayer Masker; [place] HISTORY:  Oncology History   # 2007- LEFT BREAST CA STAGE II [T2N46m; s/p mastec; Dr.Byrnett] ER-Pos/PR Neg; Her- 2 Neu- NEG; ACx4-Taxol x12;Femara; stopped May 2009-stopped follow up.  #JAN 2015- Post mastectomy Chest wall recurrence [chest wall mass bx-]; ER- 90%; PR- NEG; Her2 Neu- NEG; s/p excision [involving skeletal muscle/adipose tissue; clear margins]  # OSTEOPENIA [BMD- June 2016]     [place]   02/05/2014 Initial Diagnosis    Breast cancer metastasized to skin ([facility]      [place] left female breast ([place]   07/24/2016 Initial Diagnosis    Cancer of overlapping sites of left female breast ([facility]        INTERVAL HISTORY:  A very pleasant 75 year old African-American female patient with above history of ER/PR positive breast cancer status with chest wall recurrence in January 2015 currently on Arimidex since for follow-up/To review the results of her restaging CAT scan.  Denies any new lumps or bumps.  Patient's appetite is good. She is not losing any weight. She denies any unusual bone pain. No nausea no vomiting abdominal pain.  No unusual chest pain cough or shortness of breath.    REVIEW OF SYSTEMS:  A complete 10 point review of system is done which is negative except mentioned above/history of present illness.   PAST MEDICAL HISTORY :  Past Medical History:  Diagnosis Date  . [place]  . Arthritis   . Cancer ([facility] 2007   diagnosed 01/07 left breast with simple mastectomu & sn bx. The pt had a 3.5cm tumor. Frozen section report on the 2 sn were negative. On permanent sections a 0.536mmicrometastasis was identified. She was not felt to  require a complete axillary dissection. She has completed her Tamoxifen therapy and has been released from routine f/u with medical oncology service.  . Diabetes [place] without complication ([place]  . Hyperlipidemia   . Hypertension    since age 75. [facility]   Mastectomy site recurrence resected 01/26/2014, ER 90%, PR 0%, HER-2/neu nonamplified.  . Other benign neoplasm of connective and other soft tissue of trunk, unspecified 2014  . Personal history of colonic polyps   . Personal history of tobacco use, presenting hazards to health   . Special screening for malignant neoplasms, colon   . Unspecified disorder of skin and subcutaneous tissue 02/19/2013   biopsy of skin over the sternum on the right breast showed hypertrophic scar,keloid formation.    PAST SURGICAL HISTORY :   Past Surgical History:  Procedure Laterality Date  . ABDOMINAL HYSTERECTOMY  2004   total  . BREAST BIOPSY Right January 26, 2014   Core biopsy for mild increase breast uptake on PET scan, usual ductal hyperplasia and stromal fibrosis  . BREAST SURGERY Left 2007   mastectomy  . chest wall mass  01/26/14  . CHOLECYSTECTOMY  2007  . COLONOSCOPY W/ POLYPECTOMY  2011   Dr. EaBrynda Greathouse. EYE SURGERY Right    cataract surgery  . head surgery  1991  . MASTECTOMY Left 2007  . PORT-A-CATH REMOVAL  2008  . PORTACATH PLACEMENT  2007    FAMILY  HISTORY :   Family History  Problem Relation Age of Onset  . Cancer Other     ovarian,breast,colon cancers;relations not listed    SOCIAL HISTORY:   Social History  Substance Use Topics  . Smoking status: Current Some Day Smoker    Packs/day: 1.00    Years: 50.00    Types: Cigarettes  . Smokeless tobacco: Never Used  . Alcohol use No    ALLERGIES:  is allergic to metoprolol.  MEDICATIONS:  Current Outpatient Prescriptions  Medication Sig Dispense Refill  . anastrozole (ARIMIDEX) 1 MG tablet  TAKE ONE (1) TABLET EACH DAY 30 tablet 1  . atorvastatin (LIPITOR) 20 MG tablet     . Calcium Carbonate-Vitamin D 600-200 MG-UNIT CAPS Take 200 capsules by mouth once.    . citalopram (CELEXA) 10 MG tablet Take 1 tablet by mouth daily.    . hydrochlorothiazide (MICROZIDE) 12.5 MG capsule Take 1 capsule by mouth daily.    Marland Kitchen lisinopril (PRINIVIL,ZESTRIL) 5 MG tablet Take 1 tablet by mouth daily.    . polyethylene glycol powder (GLYCOLAX/MIRALAX) powder 255 grams one bottle for colonoscopy prep 255 g 0   No current facility-administered medications for this visit.     PHYSICAL EXAMINATION: ECOG PERFORMANCE STATUS: 0 - Asymptomatic  BP (!) 146/78 (BP Location: Left Arm, Patient Position: Sitting)   Pulse 73   Temp (!) 95.7 F (35.4 C) (Tympanic)   Resp 18   Wt 153 lb 6 oz (69.6 kg)   BMI 23.05 kg/m   Filed Weights   07/24/16 1102  Weight: 153 lb 6 oz (69.6 kg)    GENERAL: Well-nourished well-developed; Alert, no distress and comfortable.  Accompanied by her daughter. She is able to sit on the exam table with minimal help.  EYES: no pallor or icterus OROPHARYNX: no thrush or ulceration; good dentition  NECK: supple, no masses felt LYMPH:  no palpable lymphadenopathy in the cervical, axillary or inguinal regions LUNGS: clear to auscultation and  No wheeze or crackles HEART/CVS: regular rate & rhythm and no murmurs; No lower extremity edema ABDOMEN:abdomen soft, non-tender and normal bowel sounds Musculoskeletal:no cyanosis of digits and no clubbing  PSYCH: alert & oriented x 3 with fluent speech NEURO: no focal motor/sensory deficits SKIN/chest wall:  no rashes or significant lesions-left chest wall mastectomy incision; no new lumps. Right breast exam- no dominant masses felt/no skin changes. [In presence of the nurse]  LABORATORY DATA:  I have reviewed the data as listed    Component Value Date/Time   NA 135 07/24/2016 0958   NA 139 05/30/2014 0127   K 3.3 (L) 07/24/2016  0958   K 3.7 05/30/2014 0127   CL 100 (L) 07/24/2016 0958   CL 106 05/30/2014 0127   CO2 26 07/24/2016 0958   CO2 25 05/30/2014 0127   GLUCOSE 155 (H) 07/24/2016 0958   GLUCOSE 136 (H) 05/30/2014 0127   BUN 19 07/24/2016 0958   BUN 19 (H) 05/30/2014 0127   CREATININE 1.15 (H) 07/24/2016 0958   CREATININE 0.98 05/30/2014 0127   CALCIUM 8.9 07/24/2016 0958   CALCIUM 9.0 05/30/2014 0127   PROT 7.8 07/24/2016 0958   PROT 7.8 05/08/2014 1131   ALBUMIN 4.1 07/24/2016 0958   ALBUMIN 3.7 05/08/2014 1131   AST 23 07/24/2016 0958   AST 20 05/08/2014 1131   ALT 12 (L) 07/24/2016 0958   ALT 18 05/08/2014 1131   ALKPHOS 65 07/24/2016 0958   ALKPHOS 58 05/08/2014 1131   BILITOT 0.9 07/24/2016  0958   BILITOT 0.4 05/08/2014 1131   GFRNONAA 46 (L) 07/24/2016 0958   GFRNONAA 58 (L) 05/30/2014 0127   GFRAA 53 (L) 07/24/2016 0958   GFRAA >60 05/30/2014 0127    No results found for: SPEP, UPEP  Lab Results  Component Value Date   WBC 5.8 07/24/2016   NEUTROABS 3.4 07/24/2016   HGB 11.2 (L) 07/24/2016   HCT 33.9 (L) 07/24/2016   MCV 88.0 07/24/2016   PLT 248 07/24/2016      Chemistry      Component Value Date/Time   NA 135 07/24/2016 0958   NA 139 05/30/2014 0127   K 3.3 (L) 07/24/2016 0958   K 3.7 05/30/2014 0127   CL 100 (L) 07/24/2016 0958   CL 106 05/30/2014 0127   CO2 26 07/24/2016 0958   CO2 25 05/30/2014 0127   BUN 19 07/24/2016 0958   BUN 19 (H) 05/30/2014 0127   CREATININE 1.15 (H) 07/24/2016 0958   CREATININE 0.98 05/30/2014 0127      Component Value Date/Time   CALCIUM 8.9 07/24/2016 0958   CALCIUM 9.0 05/30/2014 0127   ALKPHOS 65 07/24/2016 0958   ALKPHOS 58 05/08/2014 1131   AST 23 07/24/2016 0958   AST 20 05/08/2014 1131   ALT 12 (L) 07/24/2016 0958   ALT 18 05/08/2014 1131   BILITOT 0.9 07/24/2016 0958   BILITOT 0.4 05/08/2014 1131       RADIOGRAPHIC STUDIES: I have personally reviewed the radiological images as listed and agreed with the findings  in the report. No results found.   ASSESSMENT & PLAN:   Cancer of overlapping sites of left female breast ([place]) # Left breast cancer with chest wall ipsilateral recurrence-  ER positive HER-2/neu negative- status post excision followed by radiation. Patient is currently on Arimidex; tolerating it well. Ammogram/Dr.Byrnett appt in Sep 2017.   # Clinically no evidence of recurrence noted; CT July 2017- NED.  #Osteopenia in the bone density test- recommend repeat bone density test in 6 months.  # Low back pain- L-4-5  # Hypokalemia- 3.3 dietary changes.  # Osteopenia- BMD- Jan 2017- recommend ca+ vitD BID.   # Follow-up with me in 6 months CBC CMP       [name], MD 07/24/2016 6:09 PM  Nira Conn- please fax progress to patient's PCP Dr.Charles Mayer Masker; [facility]. Thx

## 2016-07-24 NOTE — Assessment & Plan Note (Signed)
#  Left breast cancer with chest wall ipsilateral recurrence-  ER positive HER-2/neu negative- status post excision followed by radiation. Patient is currently on Arimidex; tolerating it well. Ammogram/Dr.Byrnett appt in Sep 2017.   # Clinically no evidence of recurrence noted; CT July 2017- NED.  #Osteopenia in the bone density test- recommend repeat bone density test in 6 months.  # Low back pain- L-4-5  # Hypokalemia- 3.3 dietary changes.  # Osteopenia- BMD- Jan 2017- recommend ca+ vitD BID.   # Follow-up with me in 6 months CBC CMP

## 2016-07-26 ENCOUNTER — Other Ambulatory Visit: Payer: Self-pay | Admitting: Internal Medicine

## 2016-08-07 ENCOUNTER — Other Ambulatory Visit: Payer: Self-pay

## 2016-08-07 DIAGNOSIS — Z1231 Encounter for screening mammogram for malignant neoplasm of breast: Secondary | ICD-10-CM | POA: Insufficient documentation

## 2016-08-08 ENCOUNTER — Inpatient Hospital Stay
Admission: RE | Admit: 2016-08-08 | Discharge: 2016-08-08 | Disposition: A | Discharge status: Home / Self Care | Payer: Self-pay | Source: Ambulatory Visit | Attending: *Deleted | Admitting: *Deleted

## 2016-08-08 ENCOUNTER — Other Ambulatory Visit: Payer: Self-pay | Admitting: *Deleted

## 2016-08-08 DIAGNOSIS — Z9289 Personal history of other medical treatment: Secondary | ICD-10-CM

## 2016-09-19 ENCOUNTER — Encounter: Payer: Self-pay | Admitting: *Deleted

## 2016-09-21 ENCOUNTER — Other Ambulatory Visit: Payer: Self-pay | Admitting: General Surgery

## 2016-09-21 ENCOUNTER — Ambulatory Visit
Admission: RE | Admit: 2016-09-21 | Discharge: 2016-09-21 | Disposition: A | Discharge status: Home / Self Care | Payer: Medicare Other | Source: Ambulatory Visit | Attending: General Surgery | Admitting: General Surgery

## 2016-09-21 DIAGNOSIS — Z853 Personal history of malignant neoplasm of breast: Secondary | ICD-10-CM | POA: Diagnosis not present

## 2016-09-21 DIAGNOSIS — Z1231 Encounter for screening mammogram for malignant neoplasm of breast: Secondary | ICD-10-CM | POA: Diagnosis not present

## 2016-09-25 ENCOUNTER — Ambulatory Visit: Payer: Medicare Other | Admitting: General Surgery

## 2016-11-29 DIAGNOSIS — M5430 Sciatica, unspecified side: Secondary | ICD-10-CM | POA: Insufficient documentation

## 2016-12-11 ENCOUNTER — Emergency Department: Payer: Medicare Other

## 2016-12-11 ENCOUNTER — Emergency Department
Admission: EM | Admit: 2016-12-11 | Discharge: 2016-12-11 | Disposition: A | Discharge status: Home / Self Care | Payer: Medicare Other | Attending: Emergency Medicine | Admitting: Emergency Medicine

## 2016-12-11 ENCOUNTER — Encounter: Payer: Self-pay | Admitting: Emergency Medicine

## 2016-12-11 DIAGNOSIS — R2689 Other abnormalities of gait and mobility: Secondary | ICD-10-CM

## 2016-12-11 DIAGNOSIS — E119 Type 2 diabetes mellitus without complications: Secondary | ICD-10-CM | POA: Diagnosis not present

## 2016-12-11 DIAGNOSIS — G8929 Other chronic pain: Secondary | ICD-10-CM | POA: Diagnosis not present

## 2016-12-11 DIAGNOSIS — F17210 Nicotine dependence, cigarettes, uncomplicated: Secondary | ICD-10-CM | POA: Diagnosis not present

## 2016-12-11 DIAGNOSIS — Z853 Personal history of malignant neoplasm of breast: Secondary | ICD-10-CM | POA: Insufficient documentation

## 2016-12-11 DIAGNOSIS — M5441 Lumbago with sciatica, right side: Secondary | ICD-10-CM | POA: Insufficient documentation

## 2016-12-11 DIAGNOSIS — I10 Essential (primary) hypertension: Secondary | ICD-10-CM | POA: Diagnosis not present

## 2016-12-11 DIAGNOSIS — Z79899 Other long term (current) drug therapy: Secondary | ICD-10-CM | POA: Insufficient documentation

## 2016-12-11 DIAGNOSIS — Z7409 Other reduced mobility: Secondary | ICD-10-CM | POA: Diagnosis not present

## 2016-12-11 DIAGNOSIS — M545 Low back pain: Secondary | ICD-10-CM | POA: Diagnosis present

## 2016-12-11 MED ORDER — TRAMADOL HCL 50 MG PO TABS: 25.0000 mg | ORAL_TABLET | Freq: Four times a day (QID) | ORAL | 0 refills | Status: DC | PRN

## 2016-12-11 NOTE — ED Provider Notes (Signed)
[facility] Emergency Department Provider Note  ____________________________________________  Time seen: Approximately 11:05 AM  I have reviewed the triage vital signs and the nursing notes.   HISTORY  Chief Complaint Back Pain    HPI Jessica Compton is a 75 y.o. female that presents to the emergency department with low right back pain for several months. Patient has shooting pain that goes into right buttocks. Patient states that is painful to move and it is limiting how much she is able to do around the house. Patient had x-ray done 6 months ago and showed bulging disc. Patient saw primary care 1 month ago for same symptoms and was given gabapentin. Gabapentin is not helping. Patient could not get in to see orthopedics for 2 months so she came to the emergency department. Patient has a history of breast cancer. Patient denies fever, weight loss, pain that wakes her up in the night.   Past Medical History:  Diagnosis Date  . Aneurysm ([place]) 2007  . Arthritis   . Cancer [facility]) 2007   diagnosed 01/07 left breast with simple mastectomu & sn bx. The pt had a 3.5cm tumor. Frozen section report on the 2 sn were negative. On permanent sections a 0.29m micrometastasis was identified. She was not felt to require a complete axillary dissection. She has completed her Tamoxifen therapy and has been released from routine f/u with medical oncology service.  . Diabetes mellitus without complication ([place]   . Hyperlipidemia   . Hypertension    since age 75 . Malignant [facility] of [facility]-outer quadrant of female [facility] ([facility] 2015   Mastectomy site recurrence resected 01/26/2014, ER 90%, PR 0%, HER-2/neu nonamplified.  . Other benign neoplasm of connective and other soft tissue of trunk, unspecified 2014  . Personal history of colonic polyps   . Personal history of tobacco use, presenting hazards to health   . Special screening for malignant neoplasms, colon   .  Unspecified disorder of skin and subcutaneous tissue 02/19/2013   biopsy of skin over the sternum on the right breast showed hypertrophic scar,keloid formation.    Patient Active Problem List   Diagnosis Date Noted  . Encounter for screening mammogram for breast cancer 08/07/2016  . Cancer of overlapping sites of left female breast ([place] 07/24/2016  . History of colonic polyps 03/11/2015  . Personal history of malignant neoplasm of breast 09/08/2014  . Abnormal mammogram 09/08/2014  . Breast cancer metastasized to skin ([place] 02/05/2014  . Chest wall mass 01/02/2014  . Loss of weight 01/02/2014  . Cancer ([place]   . Other benign neoplasm of connective and other soft tissue of trunk, unspecified   . [place]neurysm ([place]   . Unspecified disorder of skin and subcutaneous tissue     Past Surgical History:  Procedure Laterality Date  . ABDOMINAL HYSTERECTOMY  2004   total  . BREAST BIOPSY Right January 26, 2014   Core biopsy for mild increase breast uptake on PET scan, usual ductal hyperplasia and stromal fibrosis  . BREAST SURGERY Left 2007   mastectomy  . chest wall mass  01/26/14  . CHOLECYSTECTOMY  2007  . COLONOSCOPY W/ POLYPECTOMY  2011   Dr. EBrynda Greathouse . EYE SURGERY Right    cataract surgery  . head surgery  1991  . MASTECTOMY Left 2007  . PORT-A-CATH REMOVAL  2008  . PORTACATH PLACEMENT  2007    Prior to Admission medications   Medication Sig Start Date End Date Taking? Authorizing  Provider  anastrozole (ARIMIDEX) 1 MG tablet TAKE ONE (1) TABLET BY MOUTH EVERY DAY 07/27/16   [name], MD  atorvastatin (LIPITOR) 20 MG tablet  08/14/14   Historical Provider, MD  Calcium Carbonate-Vitamin D 600-200 MG-UNIT CAPS Take 200 capsules by mouth once. 07/20/15   Historical Provider, MD  citalopram (CELEXA) 10 MG tablet Take 1 tablet by mouth daily. 12/30/13   Historical Provider, MD  hydrochlorothiazide (MICROZIDE) 12.5 MG capsule Take 1 capsule by mouth daily. 12/30/13   Historical  Provider, MD  lisinopril (PRINIVIL,ZESTRIL) 5 MG tablet Take 1 tablet by mouth daily. 12/30/13   Historical Provider, MD  polyethylene glycol powder (GLYCOLAX/MIRALAX) powder 255 grams one bottle for colonoscopy prep 03/10/15   [name], MD  traMADol (ULTRAM) 50 MG tablet Take 0.5 tablets (25 mg total) by mouth every 6 (six) hours as needed. 12/11/16 12/11/17  Laban Emperor, PA-C    Allergies Metoprolol  Family History  Problem Relation Age of Onset  . Cancer Other     ovarian,breast,colon cancers;relations not listed  . Breast cancer Neg Hx     Social History Social History  Substance Use Topics  . Smoking status: Current Some Day Smoker    Packs/day: 1.00    Years: 50.00    Types: Cigarettes  . Smokeless tobacco: Never Used  . Alcohol use No     Review of Systems  Constitutional: No fever/chills ENT: No upper respiratory complaints. Cardiovascular: No chest pain. Respiratory: No cough. No SOB. Gastrointestinal: No abdominal pain.  No nausea, no vomiting.  Genitourinary: Negative for dysuria. Skin: Negative for rash, abrasions, lacerations, ecchymosis. Neurological: Negative for headaches, numbness or tingling   ____________________________________________   PHYSICAL EXAM:  VITAL SIGNS: ED Triage Vitals  Enc Vitals Group     BP 12/11/16 0938 (!) 160/75     Pulse Rate 12/11/16 0938 72     Resp 12/11/16 0938 18     Temp 12/11/16 0938 99 F (37.2 C)     Temp src --      SpO2 12/11/16 0938 99 %     Weight 12/11/16 0939 153 lb (69.4 kg)     Height 12/11/16 0939 5' 5"  (1.651 m)     Head Circumference --      Peak Flow --      Pain Score 12/11/16 0939 9     Pain Loc --      Pain Edu? --      Excl. in [place]? --      Constitutional: Alert and oriented. Well appearing and in no acute distress. Eyes: Conjunctivae are normal. PERRL. EOMI. Head: Atraumatic. ENT:      Ears:      Nose: No congestion/rhinnorhea.      Mouth/Throat: Mucous membranes are moist.   Neck: No stridor.  No cervical spine tenderness to palpation. Cardiovascular: Normal rate, regular rhythm. Normal S1 and S2.  Good peripheral circulation. Respiratory: Normal respiratory effort without tachypnea or retractions. Lungs CTAB. Good air entry to the bases with no decreased or absent breath sounds. Gastrointestinal: Bowel sounds 4 quadrants. Soft and nontender to palpation. No guarding or rigidity. No palpable masses. No distention. No CVA tenderness. Musculoskeletal: Full range of motion to all extremities. No gross deformities appreciated. Positive straight leg raise and cross leg raise. Neurologic:  Normal speech and language. No gross focal neurologic deficits are appreciated.  Skin:  Skin is warm, dry and intact. No rash noted. Psychiatric: Mood and affect are normal. Speech and  behavior are normal. Patient exhibits appropriate insight and judgement.   ____________________________________________   LABS (all labs ordered are listed, but only abnormal results are displayed)  Labs Reviewed - No data to display ____________________________________________  EKG   ____________________________________________  RADIOLOGY Robinette Haines, personally viewed and evaluated these images (plain radiographs) as part of my medical decision making, as well as reviewing the written report by the radiologist.  Dg Lumbar Spine 2-3 Views  Result Date: 12/11/2016 CLINICAL DATA:  Low back pain for the past 2 months with no known injury. No relief with gabapentin. The patient reports discomfort radiating into both legs. EXAM: LUMBAR SPINE - 2-3 VIEW COMPARISON:  Coronal and sagittal images from a CT scan of July 07, 2016 FINDINGS: The lumbar vertebral bodies are preserved in height. The pedicles and transverse processes are intact. There is mild degenerative disc space narrowing at L1-2, L2-3, and L3-4 with moderate narrowing at L4-5 and L5-S1. Anterior endplate osteophytes are noted at  L3-4 and L4-5. There is minimal grade 1 anterolisthesis of L3 with respect L2 and L4 likely on the basis of degenerative disc disease. The pedicles and transverse processes are intact. There is facet joint hypertrophy at L3-4, L4-5, and L5-S1. The observed portions of the sacrum are normal. There surgical clips in the gallbladder fossa. There is calcification in the wall of the aortic arch. IMPRESSION: Multilevel degenerative disc and facet joint disease of the lumbar spine. No compression fracture or significant spondylolisthesis. Abdominal aortic atherosclerosis. Electronically Signed   By: David  Martinique M.D.   On: 12/11/2016 11:27    ____________________________________________    PROCEDURES  Procedure(s) performed:    Procedures    Medications - No data to display   ____________________________________________   INITIAL IMPRESSION / ASSESSMENT AND PLAN / ED COURSE  Pertinent labs & imaging results that were available during my care of the patient were reviewed by me and considered in my medical decision making (see chart for details).  Review of the [place] CSRS was performed in accordance of the [place] prior to dispensing any controlled drugs.  Clinical Course     Patient's diagnosis is consistent with low right-sided back pain with sciatica. No focal neuro deficits. No incontinence or paresthesias. No trauma. Xray did not show acute bony abnormality. Patient will be discharged home with prescriptions for low dose tramadol. Patient and daughter were educated on side effects and concern for use in the elderly. Patient is to follow up with ortho as directed. Patient is given ED precautions to return to the ED for any worsening or new symptoms. No evidence of cauda hematoma, vascular catastrophe, spinal abscess/hematoma, or referred intra-abdominal pain. All patients questions questions were answered.     ____________________________________________  FINAL CLINICAL IMPRESSION(S) / ED  DIAGNOSES  Final diagnoses:  Chronic right-sided low back pain with right-sided sciatica  Decreased mobility      NEW MEDICATIONS STARTED DURING THIS VISIT:  Discharge Medication List as of 12/11/2016 12:01 PM    START taking these medications   Details  traMADol (ULTRAM) 50 MG tablet Take 0.5 tablets (25 mg total) by mouth every 6 (six) hours as needed., Starting Mon 12/11/2016, Until Tue 12/11/2017, Print            This chart was dictated using voice recognition software/Dragon. Despite best efforts to proofread, errors can occur which can change the meaning. Any change was purely unintentional.    Laban Emperor, PA-C 12/11/16 1913    Harvest Dark, MD 12/12/16 [phone]

## 2016-12-11 NOTE — ED Triage Notes (Signed)
Low back pain x 2 months, denies injury or fall at onset. States has been taking gabapentin from PCP since 12/6 but no relief.

## 2016-12-13 ENCOUNTER — Encounter: Payer: Self-pay | Admitting: *Deleted

## 2017-01-24 ENCOUNTER — Inpatient Hospital Stay: Payer: Medicare Other

## 2017-01-24 ENCOUNTER — Inpatient Hospital Stay: Payer: Medicare Other | Admitting: Internal Medicine

## 2017-02-09 ENCOUNTER — Inpatient Hospital Stay: Payer: Medicare Other | Admitting: Internal Medicine

## 2017-02-09 ENCOUNTER — Inpatient Hospital Stay: Payer: Medicare Other

## 2017-02-23 ENCOUNTER — Other Ambulatory Visit: Payer: Self-pay

## 2017-02-23 ENCOUNTER — Inpatient Hospital Stay: Payer: Medicare Other | Attending: Internal Medicine

## 2017-02-23 ENCOUNTER — Inpatient Hospital Stay (HOSPITAL_BASED_OUTPATIENT_CLINIC_OR_DEPARTMENT_OTHER): Payer: Medicare Other | Admitting: Internal Medicine

## 2017-02-23 VITALS — BP 195/78 | HR 80 | Temp 96.9°F | Wt 162.1 lb

## 2017-02-23 DIAGNOSIS — N281 Cyst of kidney, acquired: Secondary | ICD-10-CM | POA: Diagnosis not present

## 2017-02-23 DIAGNOSIS — Z79811 Long term (current) use of aromatase inhibitors: Secondary | ICD-10-CM | POA: Insufficient documentation

## 2017-02-23 DIAGNOSIS — M8580 Other specified disorders of bone density and structure, unspecified site: Secondary | ICD-10-CM

## 2017-02-23 DIAGNOSIS — Z79899 Other long term (current) drug therapy: Secondary | ICD-10-CM

## 2017-02-23 DIAGNOSIS — E119 Type 2 diabetes mellitus without complications: Secondary | ICD-10-CM | POA: Insufficient documentation

## 2017-02-23 DIAGNOSIS — E785 Hyperlipidemia, unspecified: Secondary | ICD-10-CM | POA: Diagnosis not present

## 2017-02-23 DIAGNOSIS — Z170 Estrogen receptor positive status [ER+]: Secondary | ICD-10-CM | POA: Insufficient documentation

## 2017-02-23 DIAGNOSIS — C50812 Malignant neoplasm of overlapping sites of left female breast: Secondary | ICD-10-CM

## 2017-02-23 DIAGNOSIS — Z9012 Acquired absence of left breast and nipple: Secondary | ICD-10-CM | POA: Insufficient documentation

## 2017-02-23 DIAGNOSIS — G8929 Other chronic pain: Secondary | ICD-10-CM | POA: Insufficient documentation

## 2017-02-23 DIAGNOSIS — Z8041 Family history of malignant neoplasm of ovary: Secondary | ICD-10-CM

## 2017-02-23 DIAGNOSIS — F17210 Nicotine dependence, cigarettes, uncomplicated: Secondary | ICD-10-CM | POA: Diagnosis not present

## 2017-02-23 DIAGNOSIS — Z853 Personal history of malignant neoplasm of breast: Secondary | ICD-10-CM | POA: Insufficient documentation

## 2017-02-23 DIAGNOSIS — M549 Dorsalgia, unspecified: Secondary | ICD-10-CM

## 2017-02-23 DIAGNOSIS — Z803 Family history of malignant neoplasm of breast: Secondary | ICD-10-CM | POA: Insufficient documentation

## 2017-02-23 DIAGNOSIS — Z800 Family history of malignant neoplasm of digestive organs: Secondary | ICD-10-CM | POA: Insufficient documentation

## 2017-02-23 DIAGNOSIS — M1990 Unspecified osteoarthritis, unspecified site: Secondary | ICD-10-CM | POA: Insufficient documentation

## 2017-02-23 DIAGNOSIS — I10 Essential (primary) hypertension: Secondary | ICD-10-CM

## 2017-02-23 DIAGNOSIS — E876 Hypokalemia: Secondary | ICD-10-CM | POA: Insufficient documentation

## 2017-02-23 DIAGNOSIS — D649 Anemia, unspecified: Secondary | ICD-10-CM

## 2017-02-23 LAB — CBC WITH DIFFERENTIAL/PLATELET
Basophils Absolute: 0 10*3/uL (ref 0–0.1)
Basophils Relative: 0 %
EOS ABS: 0.3 10*3/uL (ref 0–0.7)
EOS PCT: 5 %
HCT: 32.6 % — ABNORMAL LOW (ref 35.0–47.0)
Hemoglobin: 10.8 g/dL — ABNORMAL LOW (ref 12.0–16.0)
Lymphocytes Relative: 29 %
Lymphs Abs: 1.9 10*3/uL (ref 1.0–3.6)
MCH: 29 pg (ref 26.0–34.0)
MCHC: 33 g/dL (ref 32.0–36.0)
MCV: 87.8 fL (ref 80.0–100.0)
Monocytes Absolute: 0.5 10*3/uL (ref 0.2–0.9)
Monocytes Relative: 8 %
Neutro Abs: 3.8 10*3/uL (ref 1.4–6.5)
Neutrophils Relative %: 58 %
PLATELETS: 267 10*3/uL (ref 150–440)
RBC: 3.72 MIL/uL — AB (ref 3.80–5.20)
RDW: 14.5 % (ref 11.5–14.5)
WBC: 6.5 10*3/uL (ref 3.6–11.0)

## 2017-02-23 LAB — IRON AND TIBC
IRON: 72 ug/dL (ref 28–170)
SATURATION RATIOS: 20 % (ref 10.4–31.8)
TIBC: 368 ug/dL (ref 250–450)
UIBC: 296 ug/dL

## 2017-02-23 LAB — COMPREHENSIVE METABOLIC PANEL
ALT: 13 U/L — ABNORMAL LOW (ref 14–54)
ANION GAP: 8 (ref 5–15)
AST: 24 U/L (ref 15–41)
Albumin: 4 g/dL (ref 3.5–5.0)
Alkaline Phosphatase: 59 U/L (ref 38–126)
BUN: 24 mg/dL — ABNORMAL HIGH (ref 6–20)
CHLORIDE: 99 mmol/L — AB (ref 101–111)
CO2: 29 mmol/L (ref 22–32)
CREATININE: 0.91 mg/dL (ref 0.44–1.00)
Calcium: 9.1 mg/dL (ref 8.9–10.3)
GFR calc non Af Amer: >60 mL/min (ref >60)
Glucose, Bld: 143 mg/dL — ABNORMAL HIGH (ref 65–99)
POTASSIUM: 3.3 mmol/L — AB (ref 3.5–5.1)
SODIUM: 136 mmol/L (ref 135–145)
Total Bilirubin: 0.6 mg/dL (ref 0.3–1.2)
Total Protein: 7.7 g/dL (ref 6.5–8.1)

## 2017-02-23 LAB — FERRITIN: Ferritin: 79 ng/mL (ref 11–307)

## 2017-02-23 NOTE — Assessment & Plan Note (Addendum)
#  Left breast cancer with chest wall ipsilateral recurrence-  ER positive HER-2/neu negative- status post excision followed by radiation. Patient is currently on Arimidex; tolerating it well. Mammogram/Dr.Byrnett appt in Sep 2017- WNL.   # Clinically no evidence of recurrence noted; CT July 2017- NED. Repeat CT scan C/A/P in 6 months.   # Mild chronic Anemia- 10.5; last colo 2016 April [Dr.Byrnett]. Ad iron studies/feritin today.   #Osteopenia in the bone density test [ BMD- Jan 2017]; on ca+vit D; recommend repeat bone density test in 2 years.   # Hypokalemia- 3.3 dietary changes.  # back pain sec to arthritis- recommend follow up with PCP/ortho if needed.   # Elevated Blood pressure- recommend checking BP at home/ compliance with diet/meds; follow up with PCP if not improved.   # Follow-up with me in 6 months CBC CMP/ CT scans prior.

## 2017-02-23 NOTE — Progress Notes (Signed)
[place] OFFICE PROGRESS NOTE  Patient Care Team: [name], MD as PCP - General (Internal Medicine) [name], MD (General Surgery) Marden Noble, MD (Internal Medicine)  Dr.Charles Mayer Masker; [place] HISTORY:  Oncology History   # 2007- LEFT BREAST CA STAGE II [T2N65m; s/p mastec; Dr.Byrnett] ER-Pos/PR Neg; Her- 2 Neu- NEG; ACx4-Taxol x12;Femara; stopped May 2009-stopped follow up.  #JAN 2015- Post mastectomy Chest wall recurrence [chest wall mass bx-]; ER- 90%; PR- NEG; Her2 Neu- NEG; s/p excision [involving skeletal muscle/adipose tissue; clear margins]; CT July 2017- NED  # OSTEOPENIA [BMD- June 2016]  # Kidney cysts     Breast cancer [place]asized to skin ([place]   02/05/2014 Initial Diagnosis    Breast cancer metastasized [facility] skin ([facility]       Malignant neoplasm of overlapping sites of left breast in female, estrogen receptor positive ([place]   07/24/2016 Initial Diagnosis    Cancer of overlapping sites of left female breast ([facility]        INTERVAL HISTORY:  A very pleasant 76year old African-American female patient with above history of ER/PR positive breast cancer status with chest wall recurrence in January 2015 currently on Arimidex since for follow-up.   Denies any new lumps or bumps.  Patient's appetite is good. She is not losing any weight. She denies any unusual bone pain.Denies any headaches. Denies any blood in stool. No nausea no vomiting abdominal pain.  No unusual chest pain cough or shortness of breath. Continues to complain of chronic back pain. Not any worse.   REVIEW OF SYSTEMS:  A complete 10 point review of system is done which is negative except mentioned above/history of present illness.   PAST MEDICAL HISTORY :  Past Medical History:  Diagnosis Date  . Aneurysm ([facility] 2007  . Arthritis   . Cancer ([facility] 2007   diagnosed 01/07 left breast with simple mastectomu & sn bx. The pt had a 3.5cm  tumor. Frozen section report on the 2 sn were negative. On permanent sections a 0.543mmicrometastasis was identified. She was not felt to require a complete axillary dissection. She has completed her Tamoxifen therapy and has been released from routine f/u with medical oncology service.  . Diabetes mellitus without complication ([facility]  . Hyperlipidemia   . Hypertension    since age 76. [facility]   Mastectomy site recurrence resected 01/26/2014, ER 90%, PR 0%, HER-2/neu nonamplified.  . Other benign neoplasm of connective and other soft tissue of trunk, unspecified 2014  . Personal history of colonic polyps   . Personal history of tobacco use, presenting hazards to health   . Special screening for malignant neoplasms, colon   . Unspecified disorder of skin and subcutaneous tissue 02/19/2013   biopsy of skin over the sternum on the right breast showed hypertrophic scar,keloid formation.    PAST SURGICAL HISTORY :   Past Surgical History:  Procedure Laterality Date  . ABDOMINAL HYSTERECTOMY  2004   total  . BREAST BIOPSY Right January 26, 2014   Core biopsy for mild increase breast uptake on PET scan, usual ductal hyperplasia and stromal fibrosis  . BREAST SURGERY Left 2007   mastectomy  . chest wall mass  01/26/14  . CHOLECYSTECTOMY  2007  . COLONOSCOPY W/ POLYPECTOMY  2011   Dr. EaBrynda Greathouse. EYE SURGERY Right    cataract surgery  . head surgery  1991  . MASTECTOMY Left 2007  . PORT-A-CATH REMOVAL  2008  . PORTACATH PLACEMENT  2007    FAMILY HISTORY :   Family History  Problem Relation Age of Onset  . Cancer Other     ovarian,breast,colon cancers;relations not listed  . Breast cancer Neg Hx     SOCIAL HISTORY:   Social History  Substance Use Topics  . Smoking status: Current Some Day Smoker    Packs/day: 1.00    Years: 50.00    Types: Cigarettes  . Smokeless tobacco: Never Used  . Alcohol use No     ALLERGIES:  is allergic to metoprolol.  MEDICATIONS:  Current Outpatient Prescriptions  Medication Sig Dispense Refill  . anastrozole (ARIMIDEX) 1 MG tablet TAKE ONE (1) TABLET BY MOUTH EVERY DAY 30 tablet 5  . atorvastatin (LIPITOR) 20 MG tablet Take 20 mg by mouth daily at 6 PM.     . Calcium Carbonate-Vitamin D 600-200 MG-UNIT CAPS Take 200 capsules by mouth once.    . citalopram (CELEXA) 40 MG tablet Take 40 mg by mouth daily.    Marland Kitchen gabapentin (NEURONTIN) 300 MG capsule Take 300 mg by mouth daily.    . hydrochlorothiazide (MICROZIDE) 12.5 MG capsule Take 1 capsule by mouth daily.    Marland Kitchen lisinopril (PRINIVIL,ZESTRIL) 2.5 MG tablet Take 2.5 mg by mouth daily.    . meloxicam (MOBIC) 15 MG tablet Take 15 mg by mouth daily.    . polyethylene glycol powder (GLYCOLAX/MIRALAX) powder 255 grams one bottle for colonoscopy prep 255 g 0  . traMADol (ULTRAM) 50 MG tablet Take 0.5 tablets (25 mg total) by mouth every 6 (six) hours as needed. 14 tablet 0   No current facility-administered medications for this visit.     PHYSICAL EXAMINATION: ECOG PERFORMANCE STATUS: 0 - Asymptomatic  BP (!) 195/78 (BP Location: Left Arm, Patient Position: Sitting)   Pulse 80   Temp (!) 96.9 F (36.1 C) (Tympanic)   Wt 162 lb 2 oz (73.5 kg)   BMI 26.98 kg/m   Filed Weights   02/23/17 1055  Weight: 162 lb 2 oz (73.5 kg)    GENERAL: Well-nourished well-developed; Alert, no distress and comfortable.  Accompanied by her daughter. She is able to sit on the exam table with minimal help.  EYES: no pallor or icterus OROPHARYNX: no thrush or ulceration; good dentition  NECK: supple, no masses felt LYMPH:  no palpable lymphadenopathy in the cervical, axillary or inguinal regions LUNGS: clear to auscultation and  No wheeze or crackles HEART/CVS: regular rate & rhythm and no murmurs; No lower extremity edema ABDOMEN:abdomen soft, non-tender and normal bowel sounds Musculoskeletal:no cyanosis of digits and no  clubbing  PSYCH: alert & oriented x 3 with fluent speech NEURO: no focal motor/sensory deficits SKIN/chest wall:  no rashes or significant lesions-left chest wall mastectomy incision; no new lumps. Right breast exam- no dominant masses felt/no skin changes. [In presence of the nurse]  LABORATORY DATA:  I have reviewed the data as listed    Component Value Date/Time   NA 136 02/23/2017 1030   NA 139 05/30/2014 0127   K 3.3 (L) 02/23/2017 1030   K 3.7 05/30/2014 0127   CL 99 (L) 02/23/2017 1030   CL 106 05/30/2014 0127   CO2 29 02/23/2017 1030   CO2 25 05/30/2014 0127   GLUCOSE 143 (H) 02/23/2017 1030   GLUCOSE 136 (H) 05/30/2014 0127   BUN 24 (H) 02/23/2017 1030   BUN 19 (H) 05/30/2014 0127  CREATININE 0.91 02/23/2017 1030   CREATININE 0.98 05/30/2014 0127   CALCIUM 9.1 02/23/2017 1030   CALCIUM 9.0 05/30/2014 0127   PROT 7.7 02/23/2017 1030   PROT 7.8 05/08/2014 1131   ALBUMIN 4.0 02/23/2017 1030   ALBUMIN 3.7 05/08/2014 1131   AST 24 02/23/2017 1030   AST 20 05/08/2014 1131   ALT 13 (L) 02/23/2017 1030   ALT 18 05/08/2014 1131   ALKPHOS 59 02/23/2017 1030   ALKPHOS 58 05/08/2014 1131   BILITOT 0.6 02/23/2017 1030   BILITOT 0.4 05/08/2014 1131   GFRNONAA >60 02/23/2017 1030   GFRNONAA 58 (L) 05/30/2014 0127   GFRAA >60 02/23/2017 1030   GFRAA >60 05/30/2014 0127    No results found for: SPEP, UPEP  Lab Results  Component Value Date   WBC 6.5 02/23/2017   NEUTROABS 3.8 02/23/2017   HGB 10.8 (L) 02/23/2017   HCT 32.6 (L) 02/23/2017   MCV 87.8 02/23/2017   PLT 267 02/23/2017      Chemistry      Component Value Date/Time   NA 136 02/23/2017 1030   NA 139 05/30/2014 0127   K 3.3 (L) 02/23/2017 1030   K 3.7 05/30/2014 0127   CL 99 (L) 02/23/2017 1030   CL 106 05/30/2014 0127   CO2 29 02/23/2017 1030   CO2 25 05/30/2014 0127   BUN 24 (H) 02/23/2017 1030   BUN 19 (H) 05/30/2014 0127   CREATININE 0.91 02/23/2017 1030   CREATININE 0.98 05/30/2014 0127       Component Value Date/Time   CALCIUM 9.1 02/23/2017 1030   CALCIUM 9.0 05/30/2014 0127   ALKPHOS 59 02/23/2017 1030   ALKPHOS 58 05/08/2014 1131   AST 24 02/23/2017 1030   AST 20 05/08/2014 1131   ALT 13 (L) 02/23/2017 1030   ALT 18 05/08/2014 1131   BILITOT 0.6 02/23/2017 1030   BILITOT 0.4 05/08/2014 1131       RADIOGRAPHIC STUDIES: I have personally reviewed the radiological images as listed and agreed with the findings in the report. No results found.   ASSESSMENT & PLAN:   Malignant neoplasm of overlapping sites of left breast in female, estrogen receptor positive ([place]) # Left breast cancer with chest wall ipsilateral recurrence-  ER positive HER-2/neu negative- status post excision followed by radiation. Patient is currently on Arimidex; tolerating it well. Mammogram/Dr.Byrnett appt in Sep 2017- WNL.   # Clinically no evidence of recurrence noted; CT July 2017- NED. Repeat CT scan C/A/P in 6 months.   # Mild chronic Anemia- 10.5; last colo 2016 April [Dr.Byrnett]. Ad iron studies/feritin today.   #Osteopenia in the bone density test [ BMD- Jan 2017]; on ca+vit D; recommend repeat bone density test in 2 years.   # Hypokalemia- 3.3 dietary changes.  # back pain sec to arthritis- recommend follow up with PCP/ortho if needed.   # Elevated Blood pressure- recommend checking BP at home/ compliance with diet/meds; follow up with PCP if not improved.   # Follow-up with me in 6 months CBC CMP/ CT scans prior.     [name], MD 02/23/2017 11:24 AM

## 2017-02-23 NOTE — Progress Notes (Signed)
Patient here today for follow up.   

## 2017-02-24 LAB — CANCER ANTIGEN 27.29: CA 27.29: 19.5 U/mL (ref 0.0–38.6)

## 2017-05-23 DIAGNOSIS — E7800 Pure hypercholesterolemia, unspecified: Secondary | ICD-10-CM | POA: Insufficient documentation

## 2017-08-24 ENCOUNTER — Ambulatory Visit
Admission: RE | Admit: 2017-08-24 | Discharge: 2017-08-24 | Disposition: A | Discharge status: Home / Self Care | Payer: Medicare Other | Source: Ambulatory Visit | Attending: Internal Medicine | Admitting: Internal Medicine

## 2017-08-24 DIAGNOSIS — Z170 Estrogen receptor positive status [ER+]: Secondary | ICD-10-CM | POA: Insufficient documentation

## 2017-08-24 DIAGNOSIS — M5136 Other intervertebral disc degeneration, lumbar region: Secondary | ICD-10-CM | POA: Insufficient documentation

## 2017-08-24 DIAGNOSIS — I517 Cardiomegaly: Secondary | ICD-10-CM | POA: Diagnosis not present

## 2017-08-24 DIAGNOSIS — K5710 Diverticulosis of small intestine without perforation or abscess without bleeding: Secondary | ICD-10-CM | POA: Insufficient documentation

## 2017-08-24 DIAGNOSIS — I77819 Aortic ectasia, unspecified site: Secondary | ICD-10-CM | POA: Diagnosis not present

## 2017-08-24 DIAGNOSIS — M47896 Other spondylosis, lumbar region: Secondary | ICD-10-CM | POA: Diagnosis not present

## 2017-08-24 DIAGNOSIS — I700 Atherosclerosis of aorta: Secondary | ICD-10-CM | POA: Insufficient documentation

## 2017-08-24 DIAGNOSIS — N200 Calculus of kidney: Secondary | ICD-10-CM | POA: Diagnosis not present

## 2017-08-24 DIAGNOSIS — R93421 Abnormal radiologic findings on diagnostic imaging of right kidney: Secondary | ICD-10-CM | POA: Diagnosis not present

## 2017-08-24 DIAGNOSIS — C50812 Malignant neoplasm of overlapping sites of left female breast: Secondary | ICD-10-CM | POA: Insufficient documentation

## 2017-08-24 LAB — POCT I-STAT CREATININE: CREATININE: 1.1 mg/dL — AB (ref 0.44–1.00)

## 2017-08-24 MED ORDER — IOPAMIDOL (ISOVUE-300) INJECTION 61%
100.0000 mL | Freq: Once | INTRAVENOUS | Status: AC | PRN
  Administered 2017-08-24: 100 mL via INTRAVENOUS

## 2017-08-27 ENCOUNTER — Ambulatory Visit: Payer: Medicare Other

## 2017-08-28 ENCOUNTER — Other Ambulatory Visit: Payer: Medicare Other

## 2017-08-29 ENCOUNTER — Inpatient Hospital Stay: Payer: Medicare Other

## 2017-08-29 ENCOUNTER — Inpatient Hospital Stay: Payer: Medicare Other | Admitting: Internal Medicine

## 2017-08-31 ENCOUNTER — Telehealth: Payer: Self-pay | Admitting: *Deleted

## 2017-08-31 ENCOUNTER — Inpatient Hospital Stay (HOSPITAL_BASED_OUTPATIENT_CLINIC_OR_DEPARTMENT_OTHER): Payer: Medicare Other | Admitting: Internal Medicine

## 2017-08-31 ENCOUNTER — Inpatient Hospital Stay: Payer: Medicare Other | Attending: Internal Medicine

## 2017-08-31 VITALS — BP 152/76 | HR 64 | Temp 97.7°F | Resp 16 | Wt 156.4 lb

## 2017-08-31 DIAGNOSIS — E119 Type 2 diabetes mellitus without complications: Secondary | ICD-10-CM

## 2017-08-31 DIAGNOSIS — C50812 Malignant neoplasm of overlapping sites of left female breast: Secondary | ICD-10-CM | POA: Insufficient documentation

## 2017-08-31 DIAGNOSIS — I700 Atherosclerosis of aorta: Secondary | ICD-10-CM | POA: Diagnosis not present

## 2017-08-31 DIAGNOSIS — Z170 Estrogen receptor positive status [ER+]: Secondary | ICD-10-CM | POA: Diagnosis not present

## 2017-08-31 DIAGNOSIS — N281 Cyst of kidney, acquired: Secondary | ICD-10-CM | POA: Diagnosis not present

## 2017-08-31 DIAGNOSIS — Z853 Personal history of malignant neoplasm of breast: Secondary | ICD-10-CM

## 2017-08-31 DIAGNOSIS — G8929 Other chronic pain: Secondary | ICD-10-CM | POA: Diagnosis not present

## 2017-08-31 DIAGNOSIS — Z79811 Long term (current) use of aromatase inhibitors: Secondary | ICD-10-CM | POA: Diagnosis not present

## 2017-08-31 DIAGNOSIS — Z9012 Acquired absence of left breast and nipple: Secondary | ICD-10-CM

## 2017-08-31 DIAGNOSIS — F17210 Nicotine dependence, cigarettes, uncomplicated: Secondary | ICD-10-CM

## 2017-08-31 DIAGNOSIS — Z803 Family history of malignant neoplasm of breast: Secondary | ICD-10-CM | POA: Insufficient documentation

## 2017-08-31 DIAGNOSIS — M549 Dorsalgia, unspecified: Secondary | ICD-10-CM | POA: Diagnosis not present

## 2017-08-31 DIAGNOSIS — N289 Disorder of kidney and ureter, unspecified: Secondary | ICD-10-CM | POA: Insufficient documentation

## 2017-08-31 DIAGNOSIS — E876 Hypokalemia: Secondary | ICD-10-CM

## 2017-08-31 DIAGNOSIS — Z79899 Other long term (current) drug therapy: Secondary | ICD-10-CM | POA: Diagnosis not present

## 2017-08-31 DIAGNOSIS — I10 Essential (primary) hypertension: Secondary | ICD-10-CM | POA: Diagnosis not present

## 2017-08-31 DIAGNOSIS — D649 Anemia, unspecified: Secondary | ICD-10-CM | POA: Insufficient documentation

## 2017-08-31 DIAGNOSIS — M8580 Other specified disorders of bone density and structure, unspecified site: Secondary | ICD-10-CM

## 2017-08-31 DIAGNOSIS — Z800 Family history of malignant neoplasm of digestive organs: Secondary | ICD-10-CM | POA: Diagnosis not present

## 2017-08-31 DIAGNOSIS — E785 Hyperlipidemia, unspecified: Secondary | ICD-10-CM | POA: Diagnosis not present

## 2017-08-31 DIAGNOSIS — Z8041 Family history of malignant neoplasm of ovary: Secondary | ICD-10-CM | POA: Diagnosis not present

## 2017-08-31 DIAGNOSIS — R5383 Other fatigue: Secondary | ICD-10-CM | POA: Diagnosis not present

## 2017-08-31 LAB — COMPREHENSIVE METABOLIC PANEL
ALT: 13 U/L — ABNORMAL LOW (ref 14–54)
ANION GAP: 9 (ref 5–15)
AST: 21 U/L (ref 15–41)
Albumin: 3.9 g/dL (ref 3.5–5.0)
Alkaline Phosphatase: 74 U/L (ref 38–126)
BUN: 21 mg/dL — ABNORMAL HIGH (ref 6–20)
CHLORIDE: 99 mmol/L — AB (ref 101–111)
CO2: 30 mmol/L (ref 22–32)
Calcium: 9.4 mg/dL (ref 8.9–10.3)
Creatinine, Ser: 1.03 mg/dL — ABNORMAL HIGH (ref 0.44–1.00)
GFR, EST NON AFRICAN AMERICAN: 52 mL/min — AB (ref >60)
Glucose, Bld: 160 mg/dL — ABNORMAL HIGH (ref 65–99)
POTASSIUM: 4.4 mmol/L (ref 3.5–5.1)
Sodium: 138 mmol/L (ref 135–145)
TOTAL PROTEIN: 7.7 g/dL (ref 6.5–8.1)
Total Bilirubin: 0.9 mg/dL (ref 0.3–1.2)

## 2017-08-31 LAB — CBC WITH DIFFERENTIAL/PLATELET
Basophils Absolute: 0 10*3/uL (ref 0–0.1)
Basophils Relative: 1 %
Eosinophils Absolute: 0.2 10*3/uL (ref 0–0.7)
Eosinophils Relative: 3 %
HCT: 33.7 % — ABNORMAL LOW (ref 35.0–47.0)
Hemoglobin: 11 g/dL — ABNORMAL LOW (ref 12.0–16.0)
LYMPHS ABS: 2.1 10*3/uL (ref 1.0–3.6)
LYMPHS PCT: 30 %
MCH: 27.7 pg (ref 26.0–34.0)
MCHC: 32.7 g/dL (ref 32.0–36.0)
MCV: 84.8 fL (ref 80.0–100.0)
Monocytes Absolute: 0.5 10*3/uL (ref 0.2–0.9)
Monocytes Relative: 8 %
NEUTROS PCT: 58 %
Neutro Abs: 4 10*3/uL (ref 1.4–6.5)
PLATELETS: 238 10*3/uL (ref 150–440)
RBC: 3.98 MIL/uL (ref 3.80–5.20)
RDW: 15 % — AB (ref 11.5–14.5)
WBC: 6.8 10*3/uL (ref 3.6–11.0)

## 2017-08-31 MED ORDER — ANASTROZOLE 1 MG PO TABS: ORAL_TABLET | ORAL | 1 refills | Status: DC

## 2017-08-31 NOTE — Assessment & Plan Note (Addendum)
#  Left breast cancer with chest wall ipsilateral recurrence-  ER positive HER-2/neu negative- status post excision followed by radiation. Patient is currently on Arimidex; tolerating it well. Mammogram/Dr.Byrnett appt in Sep 2017- WNL.   # Clinically no evidence of recurrence noted; CT AUG 31st 2018- NED. Monitor clinically follow-up. Plan annual CT scans; or based on clinical necessity.   # Mild chronic Anemia- 11; last colo 2016 April [Dr.Byrnett]. Iron studies April 2018-normal. Monitor for now.  #Osteopenia in the bone density test [ BMD- Jan 2017]; on ca+vit D; recommend repeat bone density test in 2 years.   # Slightly elevated blood sugars 160s- recommend follow up with PCP  # Hypokalemia- 3.3 discussed dietary supplementation.   # Elevated Blood pressure- recommend checking BP at home- better controlled 150s/ 70s.  # Incidental findings on the CT scan-kidney cysts; chronic biliary dilatation also reviewed.  # Follow-up with me in 6 months/labs CA-27-29..  Call Dr.Byrnett office re: mammogram/follow up.  # I reviewed the blood work- with the patient/daughter in detail; also reviewed the imaging independently [as summarized above]; and with the patient in detail.   

## 2017-08-31 NOTE — Telephone Encounter (Signed)
Dr. Rogue Bussing stated that she needs to discuss this with her pcp

## 2017-08-31 NOTE — Progress Notes (Signed)
Patient is here today for a follow up. Patient reports no new concerns today.  

## 2017-08-31 NOTE — Progress Notes (Signed)
[place] OFFICE PROGRESS NOTE  Patient Care Team: [name], MD as PCP - General (Internal Medicine) Bary Castilla, Forest Gleason, MD (General Surgery) Marden Noble, MD (Internal Medicine)  Dr.Charles Mayer Masker; [place] HISTORY:  Oncology History   # 2007- LEFT BREAST CA STAGE II [T2N5m; s/p mastec; Dr.Byrnett] ER-Pos/PR Neg; Her- 2 Neu- NEG; ACx4-Taxol x12;Femara; stopped May 2009-stopped follow up.  #JAN 2015- Post mastectomy Chest wall recurrence [chest wall mass bx-]; ER- 90%; PR- NEG; Her2 Neu- NEG; s/p excision [involving skeletal muscle/adipose tissue; clear margins]; CT July 2017- NED  # OSTEOPENIA [BMD- June 2016]  # Kidney cysts     [place]   02/05/2014 Initial Diagnosis    Breast [facility]as[facility] skin ([facility]       Malignant neoplasm of overlapping sites of left breast in female, estrogen receptor positive ([facility]   07/24/2016 Initial Diagnosis    [facility]        INTERVAL HISTORY:  A very pleasant 76year old African-American female patient with above history of ER/PR positive breast cancer status with chest wall recurrence in January 2015 currently on Arimidex since for follow-up/ Review the results of the restaging CAT scans.   Denies any new lumps or bumps.  Patient's appetite is good. She is not losing any weight. She denies any unusual bone pain.Denies any headaches.   Patient complains of intermittent fatigue. Denies any blood in stool. No nausea no vomiting abdominal pain.  No unusual chest pain cough or shortness of breath. Continues to complain of chronic back pain. Not any worse.   REVIEW OF SYSTEMS:  A complete 10 point review of system is done which is negative except mentioned above/history of present illness.   PAST MEDICAL HISTORY :  Past Medical History:  Diagnosis Date  . [place] ([facility] [place]  . Arthritis   .  Cancer ([facility] 2007   diagnosed 01/07 left breast with simple mastectomu & sn bx. The pt had a 3.5cm tumor. Frozen section report on the 2 sn were negative. On permanent sections a 0.575mmicrometastasis was identified. She was not felt to require a complete axillary dissection. She has completed her Tamoxifen therapy and has been released from routine f/u with medical oncology service.  . Diabetes mellitus without complication ([place]  . Hyperlipidemia   . Hypertension    since age 76. [facility]   Mastectomy site recurrence resected 01/26/2014, ER 90%, PR 0%, HER-2/neu nonamplified.  . Other benign neoplasm of connective and other soft tissue of trunk, unspecified 2014  . Personal history of colonic polyps   . Personal history of tobacco use, presenting hazards to health   . Special screening for malignant neoplasms, colon   . Unspecified disorder of skin and subcutaneous tissue 02/19/2013   biopsy of skin over the sternum on the right breast showed hypertrophic scar,keloid formation.    PAST SURGICAL HISTORY :   Past Surgical History:  Procedure Laterality Date  . ABDOMINAL HYSTERECTOMY  2004   total  . BREAST BIOPSY Right January 26, 2014   Core biopsy for mild increase breast uptake on PET scan, usual ductal hyperplasia and stromal fibrosis  . BREAST SURGERY Left 2007   mastectomy  . chest wall mass  01/26/14  . CHOLECYSTECTOMY  2007  . COLONOSCOPY W/ POLYPECTOMY  2011   Dr.  Eason  . EYE SURGERY Right    cataract surgery  . head surgery  1991  . MASTECTOMY Left 2007  . PORT-A-CATH REMOVAL  2008  . PORTACATH PLACEMENT  2007    FAMILY HISTORY :   Family History  Problem Relation Age of Onset  . Cancer Other        ovarian,breast,colon cancers;relations not listed  . Breast cancer Neg Hx     SOCIAL HISTORY:   Social History  Substance Use Topics  . Smoking status: Current Some Day Smoker    Packs/day:  1.00    Years: 50.00    Types: Cigarettes  . Smokeless tobacco: Never Used  . Alcohol use No    ALLERGIES:  is allergic to metoprolol.  MEDICATIONS:  Current Outpatient Prescriptions  Medication Sig Dispense Refill  . anastrozole (ARIMIDEX) 1 MG tablet TAKE ONE (1) TABLET BY MOUTH EVERY DAY 90 tablet 1  . atorvastatin (LIPITOR) 20 MG tablet Take 20 mg by mouth daily at 6 PM.     . Calcium Carbonate-Vitamin D 600-200 MG-UNIT CAPS Take 200 capsules by mouth once.    . citalopram (CELEXA) 40 MG tablet Take 40 mg by mouth daily.    Marland Kitchen gabapentin (NEURONTIN) 300 MG capsule Take 300 mg by mouth daily.    . hydrochlorothiazide (MICROZIDE) 12.5 MG capsule Take 1 capsule by mouth daily.    Marland Kitchen lisinopril (PRINIVIL,ZESTRIL) 2.5 MG tablet Take 2.5 mg by mouth daily.    . polyethylene glycol powder (GLYCOLAX/MIRALAX) powder 255 grams one bottle for colonoscopy prep 255 g 0  . meloxicam (MOBIC) 15 MG tablet Take 15 mg by mouth daily.     No current facility-administered medications for this visit.     PHYSICAL EXAMINATION: ECOG PERFORMANCE STATUS: 0 - Asymptomatic  BP (!) 152/76 (BP Location: Left Arm, Patient Position: Sitting)   Pulse 64   Temp 97.7 F (36.5 C) (Tympanic)   Resp 16   Wt 156 lb 6.4 oz (70.9 kg)   BMI 26.03 kg/m   Filed Weights   08/31/17 0849 08/31/17 0850  Weight: 156 lb 6.4 oz (70.9 kg) 156 lb 6.4 oz (70.9 kg)    GENERAL: Well-nourished well-developed; Alert, no distress and comfortable.  Accompanied by her daughter. She is able to sit on the exam table with minimal help.  EYES: no pallor or icterus OROPHARYNX: no thrush or ulceration; good dentition  NECK: supple, no masses felt LYMPH:  no palpable lymphadenopathy in the cervical, axillary or inguinal regions LUNGS: clear to auscultation and  No wheeze or crackles HEART/CVS: regular rate & rhythm and no murmurs; No lower extremity edema ABDOMEN:abdomen soft, non-tender and normal bowel sounds Musculoskeletal:no  cyanosis of digits and no clubbing  PSYCH: alert & oriented x 3 with fluent speech NEURO: no focal motor/sensory deficits SKIN/chest wall:  no rashes or significant lesions-left chest wall mastectomy incision; no new lumps. Right breast exam- no dominant masses felt/no skin changes. [In presence of the nurse]  LABORATORY DATA:  I have reviewed the data as listed    Component Value Date/Time   NA 138 08/31/2017 0825   NA 139 05/30/2014 0127   K 4.4 08/31/2017 0825   K 3.7 05/30/2014 0127   CL 99 (L) 08/31/2017 0825   CL 106 05/30/2014 0127   CO2 30 08/31/2017 0825   CO2 25 05/30/2014 0127   GLUCOSE 160 (H) 08/31/2017 0825   GLUCOSE 136 (H) 05/30/2014 0127   BUN 21 (H) 08/31/2017 0825  BUN 19 (H) 05/30/2014 0127   CREATININE 1.03 (H) 08/31/2017 0825   CREATININE 0.98 05/30/2014 0127   CALCIUM 9.4 08/31/2017 0825   CALCIUM 9.0 05/30/2014 0127   PROT 7.7 08/31/2017 0825   PROT 7.8 05/08/2014 1131   ALBUMIN 3.9 08/31/2017 0825   ALBUMIN 3.7 05/08/2014 1131   AST 21 08/31/2017 0825   AST 20 05/08/2014 1131   ALT 13 (L) 08/31/2017 0825   ALT 18 05/08/2014 1131   ALKPHOS 74 08/31/2017 0825   ALKPHOS 58 05/08/2014 1131   BILITOT 0.9 08/31/2017 0825   BILITOT 0.4 05/08/2014 1131   GFRNONAA 52 (L) 08/31/2017 0825   GFRNONAA 58 (L) 05/30/2014 0127   GFRAA >60 08/31/2017 0825   GFRAA >60 05/30/2014 0127    No results found for: SPEP, UPEP  Lab Results  Component Value Date   WBC 6.8 08/31/2017   NEUTROABS 4.0 08/31/2017   HGB 11.0 (L) 08/31/2017   HCT 33.7 (L) 08/31/2017   MCV 84.8 08/31/2017   PLT 238 08/31/2017      Chemistry      Component Value Date/Time   NA 138 08/31/2017 0825   NA 139 05/30/2014 0127   K 4.4 08/31/2017 0825   K 3.7 05/30/2014 0127   CL 99 (L) 08/31/2017 0825   CL 106 05/30/2014 0127   CO2 30 08/31/2017 0825   CO2 25 05/30/2014 0127   BUN 21 (H) 08/31/2017 0825   BUN 19 (H) 05/30/2014 0127   CREATININE 1.03 (H) 08/31/2017 0825    CREATININE 0.98 05/30/2014 0127      Component Value Date/Time   CALCIUM 9.4 08/31/2017 0825   CALCIUM 9.0 05/30/2014 0127   ALKPHOS 74 08/31/2017 0825   ALKPHOS 58 05/08/2014 1131   AST 21 08/31/2017 0825   AST 20 05/08/2014 1131   ALT 13 (L) 08/31/2017 0825   ALT 18 05/08/2014 1131   BILITOT 0.9 08/31/2017 0825   BILITOT 0.4 05/08/2014 1131     IMPRESSION: 1. No findings of progressive or recurrent malignancy. 2. Abnormal hypoenhancement along portions the right kidney lower pole. This has been present since at least 2007 and is probably due to a confluence of complex cysts. Given the lack of progression I am skeptical of malignancy as a cause. Also on the right side is duplicated collecting system with mild proximal hydroureter but no hydronephrosis, and some renal scarring. There is also a nonobstructive 3 mm right kidney lower pole calculus. 3. Aortic Atherosclerosis (ICD10-I70.0). Mild cardiomegaly. Ascending aortic ectasia. 4. Other imaging findings of potential clinical significance: Emphysema (ICD10-J43.9). Chronic biliary dilatation. Transverse duodenum diverticulum. Lumbar spondylosis and degenerative disc disease.   Electronically Signed   By: Van Clines M.D.   On: 08/24/2017 10:00   RADIOGRAPHIC STUDIES: I have personally reviewed the radiological images as listed and agreed with the findings in the report. No results found.   ASSESSMENT & PLAN:   Malignant neoplasm of overlapping sites of left breast in female, estrogen receptor positive ([place]) # Left breast cancer with chest wall ipsilateral recurrence-  ER positive HER-2/neu negative- status post excision followed by radiation. Patient is currently on Arimidex; tolerating it well. Mammogram/Dr.Byrnett appt in Sep 2017- WNL.   # Clinically no evidence of recurrence noted; CT AUG 31st 2018- NED. Monitor clinically follow-up. Plan annual CT scans; or based on clinical necessity.   # Mild chronic  Anemia- 11; last colo 2016 April [Dr.Byrnett]. Iron studies April 2018-normal. Monitor for now.  #Osteopenia in the bone  density test [ BMD- Jan 2017]; on ca+vit D; recommend repeat bone density test in 2 years.   # Slightly elevated blood sugars 160s- recommend follow up with PCP  # Hypokalemia- 3.3 discussed dietary supplementation.   # Elevated Blood pressure- recommend checking BP at home- better controlled 150s/ 70s.  # Incidental findings on the CT scan-kidney cysts; chronic biliary dilatation also reviewed.  # Follow-up with me in 6 months/labs CA-27-29..  Call [place] office re: mammogram/follow up.  # I reviewed the blood work- with the patient/daughter in detail; also reviewed the imaging independently [as summarized above]; and with the patient in detail.       [name], MD 09/09/2017 6:52 PM

## 2017-08-31 NOTE — Telephone Encounter (Addendum)
Daughter called to report that a prescription for an antifungal ear drop was to be sent to [facility] for the patient and it was not sent. Please advise

## 2017-09-03 NOTE — Telephone Encounter (Signed)
Attempted to call patient, but there was no answer again this morning

## 2018-02-08 DIAGNOSIS — E559 Vitamin D deficiency, unspecified: Secondary | ICD-10-CM | POA: Insufficient documentation

## 2018-03-01 ENCOUNTER — Inpatient Hospital Stay: Payer: Medicare Other

## 2018-03-01 ENCOUNTER — Inpatient Hospital Stay: Payer: Medicare Other | Admitting: Internal Medicine

## 2018-03-01 NOTE — Assessment & Plan Note (Deleted)
#  Left breast cancer with chest wall ipsilateral recurrence-  ER positive HER-2/neu negative- status post excision followed by radiation. Patient is currently on Arimidex; tolerating it well. Mammogram/Dr.Byrnett appt in Sep 2017- WNL.   # Clinically no evidence of recurrence noted; CT AUG 31st 2018- NED. Monitor clinically follow-up. Plan annual CT scans; or based on clinical necessity.   # Mild chronic Anemia- 11; last colo 2016 April [Dr.Byrnett]. Iron studies April 2018-normal. Monitor for now.  #Osteopenia in the bone density test [ BMD- Jan 2017]; on ca+vit D; recommend repeat bone density test in 2 years.   # Slightly elevated blood sugars 160s- recommend follow up with PCP  # Hypokalemia- 3.3 discussed dietary supplementation.   # Elevated Blood pressure- recommend checking BP at home- better controlled 150s/ 70s.  # Incidental findings on the CT scan-kidney cysts; chronic biliary dilatation also reviewed.  # Follow-up with me in 6 months/labs CA-27-29..  Call [place] office re: mammogram/follow up.  # I reviewed the blood work- with the patient/daughter in detail; also reviewed the imaging independently [as summarized above]; and with the patient in detail.

## 2018-03-06 ENCOUNTER — Other Ambulatory Visit: Payer: Self-pay

## 2018-03-06 ENCOUNTER — Encounter: Payer: Self-pay | Admitting: Internal Medicine

## 2018-03-06 ENCOUNTER — Inpatient Hospital Stay: Payer: Medicare Other | Attending: Internal Medicine

## 2018-03-06 ENCOUNTER — Inpatient Hospital Stay (HOSPITAL_BASED_OUTPATIENT_CLINIC_OR_DEPARTMENT_OTHER): Payer: Medicare Other | Admitting: Internal Medicine

## 2018-03-06 VITALS — BP 153/84 | HR 69 | Temp 98.2°F | Resp 20 | Ht 65.0 in

## 2018-03-06 DIAGNOSIS — Z170 Estrogen receptor positive status [ER+]: Secondary | ICD-10-CM

## 2018-03-06 DIAGNOSIS — Z79811 Long term (current) use of aromatase inhibitors: Secondary | ICD-10-CM

## 2018-03-06 DIAGNOSIS — F17210 Nicotine dependence, cigarettes, uncomplicated: Secondary | ICD-10-CM

## 2018-03-06 DIAGNOSIS — I10 Essential (primary) hypertension: Secondary | ICD-10-CM | POA: Diagnosis not present

## 2018-03-06 DIAGNOSIS — M8580 Other specified disorders of bone density and structure, unspecified site: Secondary | ICD-10-CM | POA: Diagnosis not present

## 2018-03-06 DIAGNOSIS — D649 Anemia, unspecified: Secondary | ICD-10-CM | POA: Diagnosis not present

## 2018-03-06 DIAGNOSIS — R970 Elevated carcinoembryonic antigen [CEA]: Secondary | ICD-10-CM

## 2018-03-06 DIAGNOSIS — C50812 Malignant neoplasm of overlapping sites of left female breast: Secondary | ICD-10-CM

## 2018-03-06 LAB — CBC WITH DIFFERENTIAL/PLATELET
BASOS ABS: 0 10*3/uL (ref 0–0.1)
Basophils Relative: 0 %
Eosinophils Absolute: 0.2 10*3/uL (ref 0–0.7)
Eosinophils Relative: 3 %
HEMATOCRIT: 33.3 % — AB (ref 35.0–47.0)
HEMOGLOBIN: 10.9 g/dL — AB (ref 12.0–16.0)
LYMPHS ABS: 2.3 10*3/uL (ref 1.0–3.6)
LYMPHS PCT: 33 %
MCH: 28.1 pg (ref 26.0–34.0)
MCHC: 32.7 g/dL (ref 32.0–36.0)
MCV: 85.8 fL (ref 80.0–100.0)
Monocytes Absolute: 0.7 10*3/uL (ref 0.2–0.9)
Monocytes Relative: 10 %
NEUTROS ABS: 3.7 10*3/uL (ref 1.4–6.5)
Neutrophils Relative %: 54 %
Platelets: 222 10*3/uL (ref 150–440)
RBC: 3.89 MIL/uL (ref 3.80–5.20)
RDW: 15 % — ABNORMAL HIGH (ref 11.5–14.5)
WBC: 6.9 10*3/uL (ref 3.6–11.0)

## 2018-03-06 LAB — COMPREHENSIVE METABOLIC PANEL
ALT: 13 U/L — ABNORMAL LOW (ref 14–54)
ANION GAP: 9 (ref 5–15)
AST: 25 U/L (ref 15–41)
Albumin: 3.8 g/dL (ref 3.5–5.0)
Alkaline Phosphatase: 68 U/L (ref 38–126)
BUN: 20 mg/dL (ref 6–20)
CHLORIDE: 104 mmol/L (ref 101–111)
CO2: 26 mmol/L (ref 22–32)
Calcium: 9.1 mg/dL (ref 8.9–10.3)
Creatinine, Ser: 0.99 mg/dL (ref 0.44–1.00)
GFR, EST NON AFRICAN AMERICAN: 54 mL/min — AB (ref >60)
Glucose, Bld: 127 mg/dL — ABNORMAL HIGH (ref 65–99)
POTASSIUM: 3.9 mmol/L (ref 3.5–5.1)
Sodium: 139 mmol/L (ref 135–145)
Total Bilirubin: 0.7 mg/dL (ref 0.3–1.2)
Total Protein: 7.7 g/dL (ref 6.5–8.1)

## 2018-03-06 NOTE — Progress Notes (Signed)
Jessica Compton OFFICE PROGRESS NOTE  Patient Care Team: [name], MD as PCP - General (Internal Medicine) Bary Castilla, Forest Gleason, MD (General Surgery) Marden Noble, MD (Internal Medicine)  Dr.Charles Mayer Masker; [place] HISTORY:  Oncology History   # 2007- LEFT BREAST CA STAGE II [T2N58m; s/p mastec; Dr.Byrnett] ER-Pos/PR Neg; Her- 2 Neu- NEG; ACx4-Taxol x12;Femara; stopped May 2009-stopped follow up.  #JAN 2015- Post mastectomy Chest wall recurrence [chest wall mass bx-]; ER- 90%; PR- NEG; Her2 Neu- NEG; s/p excision [involving skeletal muscle/adipose tissue; clear margins]; CT July 2017- NED  # OSTEOPENIA [BMD- June 2016]  # Kidney cysts     Breast cancer [place]asized to skin ([facility]   02/05/2014 Initial Diagnosis    Breast cancer metastasized to skin ([facility]       Malignant neoplasm of overlapping sites of left breast in female, estrogen receptor positive ([facility]   07/24/2016 Initial Diagnosis    Cancer of overlapping sites of left female breast ([facility]        INTERVAL HISTORY:  A very pleasant 77year old African-American female patient with above history of ER/PR positive breast cancer status with chest wall recurrence in January 2015 currently on Arimidex since for follow-up.   Denies any new lumps or bumps.  Patient's appetite is good. She is not losing any weight. She denies any unusual bone pain.Denies any headaches.   Denies any blood in stool. No nausea no vomiting abdominal pain.  No unusual chest pain cough or shortness of breath. Continues to complain of chronic back pain. Not any worse.   REVIEW OF SYSTEMS:  A complete 10 point review of system is done which is negative except mentioned above/history of present illness.   PAST MEDICAL HISTORY :  Past Medical History:  Diagnosis Date  . [place]neurysm ([facility] [place]  . Arthritis   . Cancer ([facility] 2007   diagnosed 01/07 left breast with simple mastectomu & sn bx. The pt had  a 3.5cm tumor. Frozen section report on the 2 sn were negative. On permanent sections a 0.571mmicrometastasis was identified. She was not felt to require a complete axillary dissection. She has completed her Tamoxifen therapy and has been released from routine f/u with medical oncology service.  . [facility] [place] without complication ([facility]  . Hyperlipidemia   . Hypertension    since age 77. [facility]   Mastectomy site recurrence resected 01/26/2014, ER 90%, PR 0%, HER-2/neu nonamplified.  . Other benign neoplasm of connective and other soft tissue of trunk, unspecified 2014  . Personal history of colonic polyps   . Personal history of tobacco use, presenting hazards to health   . Special screening for malignant neoplasms, colon   . Unspecified disorder of skin and subcutaneous tissue 02/19/2013   biopsy of skin over the sternum on the right breast showed hypertrophic scar,keloid formation.    PAST SURGICAL HISTORY :   Past Surgical History:  Procedure Laterality Date  . ABDOMINAL HYSTERECTOMY  2004   total  . BREAST BIOPSY Right January 26, 2014   Core biopsy for mild increase breast uptake on PET scan, usual ductal hyperplasia and stromal fibrosis  . BREAST SURGERY Left 2007   mastectomy  . chest wall mass  01/26/14  . CHOLECYSTECTOMY  2007  . COLONOSCOPY W/ POLYPECTOMY  2011   Dr. EaBrynda Greathouse. EYE SURGERY Right    cataract surgery  .  head surgery  1991  . MASTECTOMY Left 2007  . PORT-A-CATH REMOVAL  2008  . PORTACATH PLACEMENT  2007    FAMILY HISTORY :   Family History  Problem Relation Age of Onset  . Cancer Other        ovarian,breast,colon cancers;relations not listed  . Breast cancer Neg Hx     SOCIAL HISTORY:   Social History   Tobacco Use  . Smoking status: Current Some Day Smoker    Packs/day: 1.00    Years: 50.00    Pack years: 50.00    Types: Cigarettes  . Smokeless tobacco: Never Used   Substance Use Topics  . Alcohol use: No    Alcohol/week: 0.0 oz  . Drug use: No    ALLERGIES:  is allergic to metoprolol.  MEDICATIONS:  Current Outpatient Medications  Medication Sig Dispense Refill  . anastrozole (ARIMIDEX) 1 MG tablet TAKE ONE (1) TABLET BY MOUTH EVERY DAY 90 tablet 1  . atorvastatin (LIPITOR) 20 MG tablet Take 20 mg by mouth daily at 6 PM.     . Calcium Carbonate-Vitamin D 600-200 MG-UNIT CAPS Take 200 capsules by mouth once.    . citalopram (CELEXA) 40 MG tablet Take 40 mg by mouth daily.    Marland Kitchen gabapentin (NEURONTIN) 300 MG capsule Take 300 mg by mouth daily.    . hydrochlorothiazide (MICROZIDE) 12.5 MG capsule Take 1 capsule by mouth daily.    Marland Kitchen lisinopril (PRINIVIL,ZESTRIL) 2.5 MG tablet Take 2.5 mg by mouth daily.    . meloxicam (MOBIC) 15 MG tablet Take 15 mg by mouth daily.    . polyethylene glycol powder (GLYCOLAX/MIRALAX) powder 255 grams one bottle for colonoscopy prep (Patient not taking: Reported on 03/06/2018) 255 g 0   No current facility-administered medications for this visit.     PHYSICAL EXAMINATION: ECOG PERFORMANCE STATUS: 0 - Asymptomatic  BP (!) 153/84   Pulse 69   Temp 98.2 F (36.8 C) (Tympanic)   Resp 20   Ht 5' 5"  (1.651 m)   BMI 26.03 kg/m   There were no vitals filed for this visit.  GENERAL: Well-nourished well-developed; Alert, no distress and comfortable.  Accompanied by her daughter. She is able to sit on the exam table with minimal help.  EYES: no pallor or icterus OROPHARYNX: no thrush or ulceration; good dentition  NECK: supple, no masses felt LYMPH:  no palpable lymphadenopathy in the cervical, axillary or inguinal regions LUNGS: clear to auscultation and  No wheeze or crackles HEART/CVS: regular rate & rhythm and no murmurs; No lower extremity edema ABDOMEN:abdomen soft, non-tender and normal bowel sounds Musculoskeletal:no cyanosis of digits and no clubbing  PSYCH: alert & oriented x 3 with fluent speech NEURO:  no focal motor/sensory deficits SKIN/chest wall:  no rashes or significant lesions-left chest wall mastectomy incision; no new lumps. Right breast exam- no dominant masses felt/no skin changes. [In presence of the nurse]  LABORATORY DATA:  I have reviewed the data as listed    Component Value Date/Time   NA 139 03/06/2018 1347   NA 139 05/30/2014 0127   K 3.9 03/06/2018 1347   K 3.7 05/30/2014 0127   CL 104 03/06/2018 1347   CL 106 05/30/2014 0127   CO2 26 03/06/2018 1347   CO2 25 05/30/2014 0127   GLUCOSE 127 (H) 03/06/2018 1347   GLUCOSE 136 (H) 05/30/2014 0127   BUN 20 03/06/2018 1347   BUN 19 (H) 05/30/2014 0127   CREATININE 0.99 03/06/2018 1347  CREATININE 0.98 05/30/2014 0127   CALCIUM 9.1 03/06/2018 1347   CALCIUM 9.0 05/30/2014 0127   PROT 7.7 03/06/2018 1347   PROT 7.8 05/08/2014 1131   ALBUMIN 3.8 03/06/2018 1347   ALBUMIN 3.7 05/08/2014 1131   AST 25 03/06/2018 1347   AST 20 05/08/2014 1131   ALT 13 (L) 03/06/2018 1347   ALT 18 05/08/2014 1131   ALKPHOS 68 03/06/2018 1347   ALKPHOS 58 05/08/2014 1131   BILITOT 0.7 03/06/2018 1347   BILITOT 0.4 05/08/2014 1131   GFRNONAA 54 (L) 03/06/2018 1347   GFRNONAA 58 (L) 05/30/2014 0127   GFRAA >60 03/06/2018 1347   GFRAA >60 05/30/2014 0127    No results found for: SPEP, UPEP  Lab Results  Component Value Date   WBC 6.9 03/06/2018   NEUTROABS 3.7 03/06/2018   HGB 10.9 (L) 03/06/2018   HCT 33.3 (L) 03/06/2018   MCV 85.8 03/06/2018   PLT 222 03/06/2018      Chemistry      Component Value Date/Time   NA 139 03/06/2018 1347   NA 139 05/30/2014 0127   K 3.9 03/06/2018 1347   K 3.7 05/30/2014 0127   CL 104 03/06/2018 1347   CL 106 05/30/2014 0127   CO2 26 03/06/2018 1347   CO2 25 05/30/2014 0127   BUN 20 03/06/2018 1347   BUN 19 (H) 05/30/2014 0127   CREATININE 0.99 03/06/2018 1347   CREATININE 0.98 05/30/2014 0127      Component Value Date/Time   CALCIUM 9.1 03/06/2018 1347   CALCIUM 9.0 05/30/2014  0127   ALKPHOS 68 03/06/2018 1347   ALKPHOS 58 05/08/2014 1131   AST 25 03/06/2018 1347   AST 20 05/08/2014 1131   ALT 13 (L) 03/06/2018 1347   ALT 18 05/08/2014 1131   BILITOT 0.7 03/06/2018 1347   BILITOT 0.4 05/08/2014 1131     IMPRESSION: 1. No findings of progressive or recurrent malignancy. 2. Abnormal hypoenhancement along portions the right kidney lower pole. This has been present since at least 2007 and is probably due to a confluence of complex cysts. Given the lack of progression I am skeptical of malignancy as a cause. Also on the right side is duplicated collecting system with mild proximal hydroureter but no hydronephrosis, and some renal scarring. There is also a nonobstructive 3 mm right kidney lower pole calculus. 3. Aortic Atherosclerosis (ICD10-I70.0). Mild cardiomegaly. Ascending aortic ectasia. 4. Other imaging findings of potential clinical significance: Emphysema (ICD10-J43.9). Chronic biliary dilatation. Transverse duodenum diverticulum. Lumbar spondylosis and degenerative disc disease.   Electronically Signed   By: Van Clines M.D.   On: 08/24/2017 10:00  Results for Jessica Compton, Jessica Compton (MRN [id number]) as of 03/10/2018 08:39  Ref. Range 01/08/2014 11:50 06/15/2015 11:11 01/12/2016 11:19 02/23/2017 10:30 03/06/2018 13:47  CA 27.29 Latest Ref Range: 0.0 - 38.6 U/mL 14.3 17.5 21.2 19.5   CA 27.29 Latest Ref Range: 0.0 - 38.6 U/mL     41.4 (H)     RADIOGRAPHIC STUDIES: I have personally reviewed the radiological images as listed and agreed with the findings in the report. No results found.   ASSESSMENT & PLAN:   Malignant neoplasm of overlapping sites of left breast in female, estrogen receptor positive ([place]) # Left breast cancer with chest wall ipsilateral recurrence-  ER positive HER-2/neu negative- status post excision/mastectomy followed by radiation. Patient is currently on Arimidex; tolerating it well.    # Clinically no evidence of recurrence  noted; CT AUG 31st 2018- NED.  Monitor clinically follow-up.  Due for mammogram at this time.  Patient last missed her mammogram in September 2018.  # Mild chronic Anemia- 11; last colo 2016 April [Dr.Byrnett]. Stable.  #Osteopenia in the bone density test [ BMD- Jan 2017]; on ca+vit D; recommend repeat bone density test in 2 years.   # Elevated Blood pressure- recommend checking BP at home- better controlled 150s/ 70s.  # Follow-up with me in 6 months/labs CA-27-29. Order mammogram asap/ BMD- in 6 months.   Addendum: CA-27-29 slightly elevated 41; recommend repeating the tumor marker in 1 month.     Jessica Sickle, MD 03/10/2018 8:40 AM

## 2018-03-06 NOTE — Assessment & Plan Note (Addendum)
#  Left breast cancer with chest wall ipsilateral recurrence-  ER positive HER-2/neu negative- status post excision/mastectomy followed by radiation. Patient is currently on Arimidex; tolerating it well.    # Clinically no evidence of recurrence noted; CT AUG 31st 2018- NED. Monitor clinically follow-up.  Due for mammogram at this time.  Patient last missed her mammogram in September 2018.  # Mild chronic Anemia- 11; last colo 2016 April [Dr.Byrnett]. Stable.  #Osteopenia in the bone density test [ BMD- Jan 2017]; on ca+vit D; recommend repeat bone density test in 2 years.   # Elevated Blood pressure- recommend checking BP at home- better controlled 150s/ 70s.  # Follow-up with me in 6 months/labs CA-27-29. Order mammogram asap/ BMD- in 6 months.   Addendum: CA-27-29 slightly elevated 41; recommend repeating the tumor marker in 1 month. 

## 2018-03-07 ENCOUNTER — Telehealth: Payer: Self-pay | Admitting: Internal Medicine

## 2018-03-07 ENCOUNTER — Other Ambulatory Visit: Payer: Self-pay | Admitting: Internal Medicine

## 2018-03-07 DIAGNOSIS — C50912 Malignant neoplasm of unspecified site of left female breast: Secondary | ICD-10-CM

## 2018-03-07 DIAGNOSIS — C792 Secondary malignant neoplasm of skin: Principal | ICD-10-CM

## 2018-03-07 LAB — CANCER ANTIGEN 27.29: CAN 27.29: 41.4 U/mL — AB (ref 0.0–38.6)

## 2018-03-07 NOTE — Telephone Encounter (Signed)
Please inform the patient's daughter that her CA-27-29 is slightly elevated; recommend repeating CA-27-29 in approximately 70month;   Please order ca-27-29/schedule lab. Thx

## 2018-03-07 NOTE — Telephone Encounter (Signed)
Lab order has been placed.  

## 2018-03-07 NOTE — Telephone Encounter (Signed)
Patient's daughter notified of these lab results and verbalized understanding.

## 2018-03-12 ENCOUNTER — Ambulatory Visit
Admission: RE | Admit: 2018-03-12 | Discharge: 2018-03-12 | Disposition: A | Discharge status: Home / Self Care | Payer: Medicare Other | Source: Ambulatory Visit | Attending: Internal Medicine | Admitting: Internal Medicine

## 2018-03-12 DIAGNOSIS — Z1231 Encounter for screening mammogram for malignant neoplasm of breast: Secondary | ICD-10-CM | POA: Insufficient documentation

## 2018-03-12 DIAGNOSIS — Z170 Estrogen receptor positive status [ER+]: Secondary | ICD-10-CM

## 2018-03-12 DIAGNOSIS — C50812 Malignant neoplasm of overlapping sites of left female breast: Secondary | ICD-10-CM

## 2018-03-20 ENCOUNTER — Emergency Department
Admission: EM | Admit: 2018-03-20 | Discharge: 2018-03-20 | Disposition: A | Discharge status: Home / Self Care | Payer: Medicare Other | Attending: Emergency Medicine | Admitting: Emergency Medicine

## 2018-03-20 ENCOUNTER — Other Ambulatory Visit: Payer: Self-pay

## 2018-03-20 ENCOUNTER — Emergency Department: Payer: Medicare Other

## 2018-03-20 DIAGNOSIS — F17210 Nicotine dependence, cigarettes, uncomplicated: Secondary | ICD-10-CM | POA: Diagnosis not present

## 2018-03-20 DIAGNOSIS — Z853 Personal history of malignant neoplasm of breast: Secondary | ICD-10-CM | POA: Diagnosis not present

## 2018-03-20 DIAGNOSIS — Z79899 Other long term (current) drug therapy: Secondary | ICD-10-CM | POA: Insufficient documentation

## 2018-03-20 DIAGNOSIS — E119 Type 2 diabetes mellitus without complications: Secondary | ICD-10-CM | POA: Insufficient documentation

## 2018-03-20 DIAGNOSIS — I10 Essential (primary) hypertension: Secondary | ICD-10-CM | POA: Diagnosis not present

## 2018-03-20 DIAGNOSIS — M5136 Other intervertebral disc degeneration, lumbar region: Secondary | ICD-10-CM | POA: Diagnosis not present

## 2018-03-20 DIAGNOSIS — M545 Low back pain: Secondary | ICD-10-CM | POA: Diagnosis present

## 2018-03-20 DIAGNOSIS — M539 Dorsopathy, unspecified: Secondary | ICD-10-CM | POA: Diagnosis not present

## 2018-03-20 MED ORDER — PREDNISONE 10 MG PO TABS: ORAL_TABLET | ORAL | 0 refills | Status: DC

## 2018-03-20 NOTE — ED Provider Notes (Signed)
[facility] Emergency Department Provider Note  ____________________________________________   First MD Initiated Contact with Patient 03/20/18 1121     (approximate)  I have reviewed the triage vital signs and the nursing notes.   HISTORY  Chief Complaint Hip Pain and Back Pain   HPI Jessica Compton is a 77 y.o. female is here complaint of right hip and lower back pain.  Patient states that she has been hurting for several weeks.  She states it is gotten worse over the last few days.  She denies any recent injury.  Patient states that she is able to bear weight but that there is increased pain with walking and that she walks with a limp.  Patient has a prescription for gabapentin and this is the only medication she is taken today.  She denies any urinary symptoms or frequency.  There is been no history of kidney stones.  She rates her pain as a 9/10.   Past Medical History:  Diagnosis Date  . Aneurysm ([place]) 2007  . Arthritis   . Cancer [facility]) 2007   diagnosed 01/07 left breast with simple mastectomu & sn bx. The pt had a 3.5cm tumor. Frozen section report on the 2 sn were negative. On permanent sections a 0.61m micrometastasis was identified. She was not felt to require a complete axillary dissection. She has completed her Tamoxifen therapy and has been released from routine f/u with medical oncology service.  . Diabetes mellitus without complication ([facility]   . Hyperlipidemia   . Hypertension    since age 77 . Malignant neoplasm of upper-outer quadrant of female breast ([facility] January 2015   Mastectomy site recurrence resected 01/26/2014, ER 90%, PR 0%, HER-2/neu nonamplified.  . Other benign neoplasm of connective and other soft tissue of trunk, unspecified 2014  . Personal history of colonic polyps   . Personal history of tobacco use, presenting hazards to health   . Special screening for malignant neoplasms, colon   . Unspecified disorder of skin and  subcutaneous tissue 02/19/2013   biopsy of skin over the sternum on the right breast showed hypertrophic scar,keloid formation.    Patient Active Problem List   Diagnosis Date Noted  . Encounter for screening mammogram for breast cancer 08/07/2016  . Malignant neoplasm of overlapping sites of left breast in female, estrogen receptor positive ([place] 07/24/2016  . History of colonic polyps 03/11/2015  . Personal history of malignant neoplasm of breast 09/08/2014  . Abnormal mammogram 09/08/2014  . Breast cancer metastasized to skin ([facility] 02/05/2014  . Chest wall mass 01/02/2014  . Loss of weight 01/02/2014  . Cancer ([facility]   . Other benign neoplasm of connective and other soft tissue of trunk, unspecified   . [place]neurysm ([place]   . Unspecified disorder of skin and subcutaneous tissue     Past Surgical History:  Procedure Laterality Date  . ABDOMINAL HYSTERECTOMY  2004   total  . BREAST BIOPSY Right January 26, 2014   Core biopsy for mild increase breast uptake on PET scan, usual ductal hyperplasia and stromal fibrosis  . BREAST SURGERY Left 2007   mastectomy  . chest wall mass  01/26/14  . CHOLECYSTECTOMY  2007  . COLONOSCOPY W/ POLYPECTOMY  2011   Dr. EBrynda Greathouse . EYE SURGERY Right    cataract surgery  . head surgery  1991  . MASTECTOMY Left 2007  . PORT-A-CATH REMOVAL  2008  . PORTACATH PLACEMENT  2007    Prior to  Admission medications   Medication Sig Start Date End Date Taking? Authorizing Provider  anastrozole (ARIMIDEX) 1 MG tablet TAKE ONE (1) TABLET BY MOUTH EVERY DAY 08/31/17   [name], MD  atorvastatin (LIPITOR) 20 MG tablet Take 20 mg by mouth daily at 6 PM.  08/14/14   [provider]  Calcium Carbonate-Vitamin D 600-200 MG-UNIT CAPS Take 200 capsules by mouth once. 07/20/15   [provider]  citalopram (CELEXA) 40 MG tablet Take 40 mg by mouth daily. 10/25/16   [provider]  gabapentin (NEURONTIN) 300 MG capsule Take 300 mg by mouth  daily. 12/13/16   [provider]  hydrochlorothiazide (MICROZIDE) 12.5 MG capsule Take 1 capsule by mouth daily. 12/30/13   [provider]  lisinopril (PRINIVIL,ZESTRIL) 2.5 MG tablet Take 2.5 mg by mouth daily. 10/27/16   [provider]  meloxicam (MOBIC) 15 MG tablet Take 15 mg by mouth daily. 01/15/17   [provider]  polyethylene glycol powder (GLYCOLAX/MIRALAX) powder 255 grams one bottle for colonoscopy prep Patient not taking: Reported on 03/06/2018 03/10/15   [name], MD  predniSONE (DELTASONE) 10 MG tablet Take 1 tabs once day until finished 03/20/18   Johnn Hai, PA-C    Allergies Metoprolol  Family History  Problem Relation Age of Onset  . Cancer Other        ovarian,breast,colon cancers;relations not listed  . Breast cancer Neg Hx     Social History Social History   Tobacco Use  . Smoking status: Current Some Day Smoker    Packs/day: 1.00    Years: 50.00    Pack years: 50.00    Types: Cigarettes  . Smokeless tobacco: Never Used  Substance Use Topics  . Alcohol use: No    Alcohol/week: 0.0 oz  . Drug use: No    Review of Systems Constitutional: No fever/chills Cardiovascular: Denies chest pain. Respiratory: Denies shortness of breath. Gastrointestinal: No abdominal pain.  No nausea, no vomiting. Genitourinary: Negative for dysuria. Musculoskeletal: Positive for right hip pain and lower back pain. Skin: Negative for rash. Neurological: Negative for headaches, focal weakness or numbness. ___________________________________________   PHYSICAL EXAM:  VITAL SIGNS: ED Triage Vitals  Enc Vitals Group     BP 03/20/18 1032 (!) 177/68     Pulse Rate 03/20/18 1032 75     Resp 03/20/18 1032 18     Temp 03/20/18 1032 99.3 F (37.4 C)     Temp Source 03/20/18 1032 Oral     SpO2 03/20/18 1032 97 %     Weight 03/20/18 1033 153 lb (69.4 kg)     Height 03/20/18 1033 _0  (1.651 m)     Head Circumference --       Peak Flow --      Pain Score 03/20/18 1033 9     Pain Loc --      Pain Edu? --      Excl. in [place]? --    Constitutional: Alert and oriented. Well appearing and in no acute distress. Eyes: Conjunctivae are normal.  Head: Atraumatic. Nose: No congestion/rhinnorhea. Neck: No stridor.   Cardiovascular: Normal rate, regular rhythm. Grossly normal heart sounds.  Good peripheral circulation. Respiratory: Normal respiratory effort.  No retractions. Lungs CTAB. Gastrointestinal: Soft and nontender. No distention.  No CVA tenderness. Musculoskeletal: There is some moderate tenderness on palpation of the sacral area and over to the right SI joint area.  Range of motion of the right hip is restricted  in all planes secondary to increased pain.  There is no rotation or shortening of the extremity at present.  No gross deformity or soft tissue swelling is present.  Motor sensory function distally is intact.  There is no edema lower extremity.  Skin is intact and no erythema or abrasions were seen. Neurologic:  Normal speech and language. No gross focal neurologic deficits are appreciated.  Skin:  Skin is warm, dry and intact. No rash noted. Psychiatric: Mood and affect are normal. Speech and behavior are normal.  ____________________________________________   LABS (all labs ordered are listed, but only abnormal results are displayed)  Labs Reviewed - No data to display ____________________________________________  RADIOLOGY  ED MD interpretation:   Right hip x-ray is negative for fracture.   Lumbar spine x-ray shows degenerative disc disease at multiple levels.  Official radiology report(s): Dg Lumbar Spine 2-3 Views  Result Date: 03/20/2018 CLINICAL DATA:  Low back and right leg pain without known injury. EXAM: LUMBAR SPINE - 2-3 VIEW COMPARISON:  CT scan of August 24, 2017. FINDINGS: No fracture or significant spondylolisthesis is noted. Mild degenerative disc disease is noted at L2-3, L3-4,  L4-5 and L5-S1 with anterior osteophyte formation. Degenerative changes seen involving the posterior facet joints of L4-5 and L5-S1. IMPRESSION: Multilevel degenerative disc disease. No acute abnormality seen in the lumbar spine. Electronically Signed   By: Marijo Conception, M.D.   On: 03/20/2018 12:47   Dg Hip Unilat W Or Wo Pelvis 2-3 Views Right  Result Date: 03/20/2018 CLINICAL DATA:  Right hip pain without known injury. EXAM: DG HIP (WITH OR WITHOUT PELVIS) 2-3V RIGHT COMPARISON:  None. FINDINGS: There is no evidence of hip fracture or dislocation. There is no evidence of arthropathy or other focal bone abnormality. IMPRESSION: Normal right hip. Electronically Signed   By: Marijo Conception, M.D.   On: 03/20/2018 12:49    ____________________________________________   PROCEDURES  Procedure(s) performed: None  Procedures  Critical Care performed: No  ____________________________________________   INITIAL IMPRESSION / ASSESSMENT AND PLAN / ED COURSE  As part of my medical decision making, I reviewed the following data within the electronic MEDICAL RECORD NUMBER Notes from prior ED visits and [place] Controlled Substance Database  Patient was given a prescription for prednisone 10 mg 1 daily for the next 7 days.  She is to make an appointment with her PCP to discuss any pain medications that she might take at her age that will not cause any GI distress or increase her risk for falling.  Patient was made aware that she does have multiple levels of degenerative disc disease.  Patient is agreeable with this plan.  ____________________________________________   FINAL CLINICAL IMPRESSION(S) / ED DIAGNOSES  Final diagnoses:  Degenerative disc disease, lumbar  Multilevel degenerative disc disease     ED Discharge Orders        Ordered    predniSONE (DELTASONE) 10 MG tablet     03/20/18 1309       Note:  This document was prepared using Dragon voice recognition software and may include  unintentional dictation errors.    Johnn Hai, PA-C 03/20/18 1327    Lisa Roca, MD 03/20/18 [phone]

## 2018-03-20 NOTE — Discharge Instructions (Signed)
Follow-up with your primary care doctor for any continued medication for your back pain.  The medication that was prescribed for you today will not cause drowsiness or increase her risk for falling.  You may use ice or heat to your back as needed for discomfort.  Call and make a follow-up appointment with your primary care provider.

## 2018-03-20 NOTE — ED Triage Notes (Signed)
Pt reports that her back and right hip pain. She states that it has been hurting for awhile but has gotten worse over the last few days. Pt is able to bear weight but walks with a limp.

## 2018-03-20 NOTE — ED Notes (Addendum)
No falls.  able to stand but extreme pain since Friday.  Occurred after vacuuming the house.

## 2018-04-04 ENCOUNTER — Inpatient Hospital Stay: Payer: Medicare Other | Attending: Internal Medicine

## 2018-04-04 ENCOUNTER — Other Ambulatory Visit: Payer: Self-pay

## 2018-04-04 DIAGNOSIS — C50812 Malignant neoplasm of overlapping sites of left female breast: Secondary | ICD-10-CM | POA: Insufficient documentation

## 2018-04-04 DIAGNOSIS — C792 Secondary malignant neoplasm of skin: Principal | ICD-10-CM

## 2018-04-04 DIAGNOSIS — C50912 Malignant neoplasm of unspecified site of left female breast: Secondary | ICD-10-CM

## 2018-04-05 LAB — CANCER ANTIGEN 27.29: CA 27.29: 31.2 U/mL (ref 0.0–38.6)

## 2018-05-29 ENCOUNTER — Telehealth: Payer: Self-pay | Admitting: *Deleted

## 2018-05-29 NOTE — Telephone Encounter (Signed)
Contacted daughter with ca27.29 results.

## 2018-05-29 NOTE — Telephone Encounter (Signed)
-----   Message from [name], MD sent at 05/27/2018 10:14 PM EDT ----- Please inform daughter that patient's tumor marker repeated-was normal; follow-up as planned; no new recommendations.Thx

## 2018-09-04 ENCOUNTER — Inpatient Hospital Stay: Payer: Medicare Other

## 2018-09-04 ENCOUNTER — Inpatient Hospital Stay: Payer: Medicare Other | Admitting: Internal Medicine

## 2018-10-25 ENCOUNTER — Other Ambulatory Visit: Payer: Self-pay | Admitting: Internal Medicine

## 2018-10-25 DIAGNOSIS — C50812 Malignant neoplasm of overlapping sites of left female breast: Secondary | ICD-10-CM

## 2018-10-25 DIAGNOSIS — Z170 Estrogen receptor positive status [ER+]: Principal | ICD-10-CM

## 2018-10-28 ENCOUNTER — Other Ambulatory Visit: Payer: Self-pay

## 2018-10-28 ENCOUNTER — Inpatient Hospital Stay: Payer: Medicare Other | Attending: Internal Medicine

## 2018-10-28 ENCOUNTER — Inpatient Hospital Stay (HOSPITAL_BASED_OUTPATIENT_CLINIC_OR_DEPARTMENT_OTHER): Payer: Medicare Other | Admitting: Internal Medicine

## 2018-10-28 VITALS — BP 187/83 | HR 71 | Temp 96.9°F | Resp 18 | Wt 156.2 lb

## 2018-10-28 DIAGNOSIS — M8580 Other specified disorders of bone density and structure, unspecified site: Secondary | ICD-10-CM

## 2018-10-28 DIAGNOSIS — Z170 Estrogen receptor positive status [ER+]: Principal | ICD-10-CM

## 2018-10-28 DIAGNOSIS — Z800 Family history of malignant neoplasm of digestive organs: Secondary | ICD-10-CM | POA: Insufficient documentation

## 2018-10-28 DIAGNOSIS — I10 Essential (primary) hypertension: Secondary | ICD-10-CM | POA: Diagnosis not present

## 2018-10-28 DIAGNOSIS — R978 Other abnormal tumor markers: Secondary | ICD-10-CM | POA: Insufficient documentation

## 2018-10-28 DIAGNOSIS — F17210 Nicotine dependence, cigarettes, uncomplicated: Secondary | ICD-10-CM | POA: Insufficient documentation

## 2018-10-28 DIAGNOSIS — C50812 Malignant neoplasm of overlapping sites of left female breast: Secondary | ICD-10-CM | POA: Insufficient documentation

## 2018-10-28 DIAGNOSIS — E119 Type 2 diabetes mellitus without complications: Secondary | ICD-10-CM

## 2018-10-28 DIAGNOSIS — Z803 Family history of malignant neoplasm of breast: Secondary | ICD-10-CM

## 2018-10-28 DIAGNOSIS — Z8041 Family history of malignant neoplasm of ovary: Secondary | ICD-10-CM | POA: Insufficient documentation

## 2018-10-28 DIAGNOSIS — D649 Anemia, unspecified: Secondary | ICD-10-CM | POA: Diagnosis not present

## 2018-10-28 LAB — COMPREHENSIVE METABOLIC PANEL
ALT: 18 U/L (ref 0–44)
ANION GAP: 10 (ref 5–15)
AST: 23 U/L (ref 15–41)
Albumin: 4.2 g/dL (ref 3.5–5.0)
Alkaline Phosphatase: 72 U/L (ref 38–126)
BUN: 19 mg/dL (ref 8–23)
CHLORIDE: 100 mmol/L (ref 98–111)
CO2: 29 mmol/L (ref 22–32)
Calcium: 9.4 mg/dL (ref 8.9–10.3)
Creatinine, Ser: 1 mg/dL (ref 0.44–1.00)
GFR calc non Af Amer: 53 mL/min — ABNORMAL LOW (ref >60)
Glucose, Bld: 160 mg/dL — ABNORMAL HIGH (ref 70–99)
POTASSIUM: 3.5 mmol/L (ref 3.5–5.1)
SODIUM: 139 mmol/L (ref 135–145)
Total Bilirubin: 0.4 mg/dL (ref 0.3–1.2)
Total Protein: 7.9 g/dL (ref 6.5–8.1)

## 2018-10-28 LAB — CBC WITH DIFFERENTIAL/PLATELET
Abs Immature Granulocytes: 0.01 10*3/uL (ref 0.00–0.07)
BASOS ABS: 0 10*3/uL (ref 0.0–0.1)
Basophils Relative: 0 %
EOS ABS: 0.1 10*3/uL (ref 0.0–0.5)
EOS PCT: 2 %
HCT: 37.4 % (ref 36.0–46.0)
Hemoglobin: 11.7 g/dL — ABNORMAL LOW (ref 12.0–15.0)
Immature Granulocytes: 0 %
Lymphocytes Relative: 32 %
Lymphs Abs: 2.3 10*3/uL (ref 0.7–4.0)
MCH: 28.3 pg (ref 26.0–34.0)
MCHC: 31.3 g/dL (ref 30.0–36.0)
MCV: 90.6 fL (ref 80.0–100.0)
MONO ABS: 0.6 10*3/uL (ref 0.1–1.0)
Monocytes Relative: 8 %
NEUTROS ABS: 4.2 10*3/uL (ref 1.7–7.7)
NEUTROS PCT: 58 %
Platelets: 242 10*3/uL (ref 150–400)
RBC: 4.13 MIL/uL (ref 3.87–5.11)
RDW: 13.2 % (ref 11.5–15.5)
WBC: 7.3 10*3/uL (ref 4.0–10.5)
nRBC: 0 % (ref 0.0–0.2)

## 2018-10-28 NOTE — Assessment & Plan Note (Addendum)
#  Left breast cancer with chest wall ipsilateral recurrence-  ER positive HER-2/neu negative- status post excision/mastectomy followed by radiation.  Currently on Arimidex; stable.  #Clinically no evidence of recurrence [see discussion below]; most recent tumor marker in April 2019 within normal limits.  Tumor marker from today's pending.  #Right breast keloid-clinically.  Monitor for now.  If worse recommend further imaging/will review with Dr. Byrnett.   # Mild chronic Anemia- 11; last colo 2016 April [Dr.Byrnett].  Stable.  #Osteopenia in the bone density test [ BMD- Jan 2017]; on ca+vit D; stable.  # Elevated Blood pressure-systolic 180s.  Recommend checking BP at home- better controlled 150s/ 70s.  # DISPOSITION: # follow up in 2 months/ no labs- Dr.B  Addendum: CA 27-29-elevated 56.  Recommend repeating the tumor marker in 1 month.  Family will be informed. 

## 2018-10-28 NOTE — Progress Notes (Signed)
[place] OFFICE PROGRESS NOTE  Patient Care Team: [name], MD as PCP - General (Internal Medicine) Bary Castilla, Forest Gleason, MD (General Surgery) Marden Noble, MD (Internal Medicine)  Dr.Charles Mayer Masker; [place] HISTORY:  Oncology History   # 2007- LEFT BREAST CA STAGE II [T2N32m; s/p mastec; Dr.Byrnett] ER-Pos/PR Neg; Her- 2 Neu- NEG; ACx4-Taxol x12;Femara; stopped May 2009-stopped follow up.  #JAN 2015- Post mastectomy Chest wall recurrence [chest wall mass bx-]; ER- 90%; PR- NEG; Her2 Neu- NEG; s/p excision [involving skeletal muscle/adipose tissue; clear margins]; CT July 2017- NED  # OSTEOPENIA [BMD- June 2016]  # Kidney cysts  DIAGNOSIS: LEFT BREAST CA  STAGE: IV- NED  ;GOALS: control  CURRENT/MOST RECENT THERAPY: on arimidex      [place] skin [place]   02/05/2014 Initial Diagnosis    Breast cancer metastasized [facility] skin ([facility]     Malignant neoplasm of overlapping sites of left breast in female, estrogen receptor positive ([facility]   07/24/2016 Initial Diagnosis    Cancer of overlapping sites of left female breast ([facility]      INTERVAL HISTORY:  A very pleasant 77year old African-American female patient with above history of ER/PR positive breast cancer status with chest wall recurrence in January 2015 currently on Arimidex since for follow-up.   Patient admits to rash/knot in the right breast/however states that he has been present for many years.  Denies any pain.  Does admit to intermittent itch.  No other new complaints of nausea vomiting.  Denies any abdominal pain.  Denies any unusual chest pain or shortness of breath cough.  Chronic back pain.  Not any worse.  Review of Systems  Constitutional: Positive for malaise/fatigue. Negative for chills, diaphoresis, fever and weight loss.  HENT: Negative for nosebleeds and sore throat.   Eyes: Negative for double vision.  Respiratory: Negative  for cough, hemoptysis, sputum production, shortness of breath and wheezing.   Cardiovascular: Negative for chest pain, palpitations, orthopnea and leg swelling.  Gastrointestinal: Negative for abdominal pain, blood in stool, constipation, diarrhea, heartburn, melena, nausea and vomiting.  Genitourinary: Negative for dysuria, frequency and urgency.  Musculoskeletal: Positive for back pain and joint pain.  Skin: Negative.  Negative for itching and rash.  Neurological: Negative for dizziness, tingling, focal weakness, weakness and headaches.  Endo/Heme/Allergies: Does not bruise/bleed easily.  Psychiatric/Behavioral: Negative for depression. The patient is not nervous/anxious and does not have insomnia.      PAST MEDICAL HISTORY :  Past Medical History:  Diagnosis Date  . [facility]  . Arthritis   . Cancer ([facility] 2007   diagnosed 01/07 left breast with simple mastectomu & sn bx. The pt had a 3.5cm tumor. Frozen section report on the 2 sn were negative. On permanent sections a 0.558mmicrometastasis was identified. She was not felt to require a complete axillary dissection. She has completed her Tamoxifen therapy and has been released from routine f/u with medical oncology service.  . [place]  . Hyperlipidemia   . Hypertension    since age 77. [facility]   Mastectomy site recurrence resected 01/26/2014, ER 90%, PR 0%, HER-2/neu nonamplified.  . Other benign neoplasm of connective and other soft tissue of trunk, unspecified 2014  . Personal history of colonic polyps   . Personal history of tobacco use, presenting hazards to health   . Special screening  for malignant neoplasms, colon   . Unspecified disorder of skin and subcutaneous tissue 02/19/2013   biopsy of skin over the sternum on the right breast showed hypertrophic scar,keloid formation.    PAST SURGICAL HISTORY :    Past Surgical History:  Procedure Laterality Date  . ABDOMINAL HYSTERECTOMY  2004   total  . BREAST BIOPSY Right January 26, 2014   Core biopsy for mild increase breast uptake on PET scan, usual ductal hyperplasia and stromal fibrosis  . BREAST SURGERY Left 2007   mastectomy  . chest wall mass  01/26/14  . CHOLECYSTECTOMY  2007  . COLONOSCOPY W/ POLYPECTOMY  2011   Dr. Brynda Greathouse  . EYE SURGERY Right    cataract surgery  . head surgery  1991  . MASTECTOMY Left 2007  . PORT-A-CATH REMOVAL  2008  . PORTACATH PLACEMENT  2007    FAMILY HISTORY :   Family History  Problem Relation Age of Onset  . Cancer Other        ovarian,breast,colon cancers;relations not listed  . Breast cancer Neg Hx     SOCIAL HISTORY:   Social History   Tobacco Use  . Smoking status: Current Some Day Smoker    Packs/day: 1.00    Years: 50.00    Pack years: 50.00    Types: Cigarettes  . Smokeless tobacco: Never Used  Substance Use Topics  . Alcohol use: No    Alcohol/week: 0.0 standard drinks  . Drug use: No    ALLERGIES:  is allergic to metoprolol.  MEDICATIONS:  Current Outpatient Medications  Medication Sig Dispense Refill  . anastrozole (ARIMIDEX) 1 MG tablet TAKE ONE (1) TABLET BY MOUTH EVERY DAY 90 tablet 1  . atorvastatin (LIPITOR) 20 MG tablet Take 20 mg by mouth daily at 6 PM.     . Calcium Carbonate-Vitamin D 600-200 MG-UNIT CAPS Take 200 capsules by mouth once.    . Cholecalciferol (VITAMIN D3) 1000 units CAPS Take by mouth.    . citalopram (CELEXA) 40 MG tablet Take 40 mg by mouth daily.    . hydrochlorothiazide (MICROZIDE) 12.5 MG capsule Take 1 capsule by mouth daily.    Marland Kitchen lisinopril (PRINIVIL,ZESTRIL) 2.5 MG tablet Take 2.5 mg by mouth daily.    Marland Kitchen tiZANidine (ZANAFLEX) 4 MG tablet TAKE 1 TABLET BY MOUTH THREE TIMES A DAY AS NEEDED (BACK PAIN/SPASMS).    . traMADol (ULTRAM) 50 MG tablet Take by mouth.    . gabapentin (NEURONTIN) 300 MG capsule Take 300 mg by mouth daily.    .  meloxicam (MOBIC) 15 MG tablet Take 15 mg by mouth daily.    . polyethylene glycol powder (GLYCOLAX/MIRALAX) powder 255 grams one bottle for colonoscopy prep (Patient not taking: Reported on 03/06/2018) 255 g 0   No current facility-administered medications for this visit.     PHYSICAL EXAMINATION: ECOG PERFORMANCE STATUS: 0 - Asymptomatic  BP (!) 187/83 (BP Location: Left Arm, Patient Position: Sitting)   Pulse 71   Temp (!) 96.9 F (36.1 C) (Tympanic)   Resp 18   Wt 156 lb 3.2 oz (70.9 kg)   BMI 25.99 kg/m   Filed Weights   10/28/18 1415  Weight: 156 lb 3.2 oz (70.9 kg)    Physical Exam  Constitutional: She is oriented to person, place, and time and well-developed, well-nourished, and in no distress.  Accompanied by daughter.  She is walking by self.  HENT:  Head: Normocephalic and atraumatic.  Mouth/Throat: Oropharynx is clear and moist. No  oropharyngeal exudate.  Eyes: Pupils are equal, round, and reactive to light.  Neck: Normal range of motion. Neck supple.  Cardiovascular: Normal rate and regular rhythm.  Pulmonary/Chest: No respiratory distress. She has no wheezes.  Abdominal: Soft. Bowel sounds are normal. She exhibits no distension and no mass. There is no tenderness. There is no rebound and no guarding.  Musculoskeletal: Normal range of motion. She exhibits no edema or tenderness.  Neurological: She is alert and oriented to person, place, and time.  Skin: Skin is warm.  Keloid noted in the right inner lower quadrant of the breast.  No new lumps or bumps noted.  Psychiatric: Affect normal.     LABORATORY DATA:  I have reviewed the data as listed    Component Value Date/Time   NA 139 10/28/2018 1350   NA 139 05/30/2014 0127   K 3.5 10/28/2018 1350   K 3.7 05/30/2014 0127   CL 100 10/28/2018 1350   CL 106 05/30/2014 0127   CO2 29 10/28/2018 1350   CO2 25 05/30/2014 0127   GLUCOSE 160 (H) 10/28/2018 1350   GLUCOSE 136 (H) 05/30/2014 0127   BUN 19  10/28/2018 1350   BUN 19 (H) 05/30/2014 0127   CREATININE 1.00 10/28/2018 1350   CREATININE 0.98 05/30/2014 0127   CALCIUM 9.4 10/28/2018 1350   CALCIUM 9.0 05/30/2014 0127   PROT 7.9 10/28/2018 1350   PROT 7.8 05/08/2014 1131   ALBUMIN 4.2 10/28/2018 1350   ALBUMIN 3.7 05/08/2014 1131   AST 23 10/28/2018 1350   AST 20 05/08/2014 1131   ALT 18 10/28/2018 1350   ALT 18 05/08/2014 1131   ALKPHOS 72 10/28/2018 1350   ALKPHOS 58 05/08/2014 1131   BILITOT 0.4 10/28/2018 1350   BILITOT 0.4 05/08/2014 1131   GFRNONAA 53 (L) 10/28/2018 1350   GFRNONAA 58 (L) 05/30/2014 0127   GFRAA >60 10/28/2018 1350   GFRAA >60 05/30/2014 0127    No results found for: SPEP, UPEP  Lab Results  Component Value Date   WBC 7.3 10/28/2018   NEUTROABS 4.2 10/28/2018   HGB 11.7 (L) 10/28/2018   HCT 37.4 10/28/2018   MCV 90.6 10/28/2018   PLT 242 10/28/2018      Chemistry      Component Value Date/Time   NA 139 10/28/2018 1350   NA 139 05/30/2014 0127   K 3.5 10/28/2018 1350   K 3.7 05/30/2014 0127   CL 100 10/28/2018 1350   CL 106 05/30/2014 0127   CO2 29 10/28/2018 1350   CO2 25 05/30/2014 0127   BUN 19 10/28/2018 1350   BUN 19 (H) 05/30/2014 0127   CREATININE 1.00 10/28/2018 1350   CREATININE 0.98 05/30/2014 0127      Component Value Date/Time   CALCIUM 9.4 10/28/2018 1350   CALCIUM 9.0 05/30/2014 0127   ALKPHOS 72 10/28/2018 1350   ALKPHOS 58 05/08/2014 1131   AST 23 10/28/2018 1350   AST 20 05/08/2014 1131   ALT 18 10/28/2018 1350   ALT 18 05/08/2014 1131   BILITOT 0.4 10/28/2018 1350   BILITOT 0.4 05/08/2014 1131     IMPRESSION: 1. No findings of progressive or recurrent malignancy. 2. Abnormal hypoenhancement along portions the right kidney lower pole. This has been present since at least 2007 and is probably due to a confluence of complex cysts. Given the lack of progression I am skeptical of malignancy as a cause. Also on the right side is duplicated collecting system  with mild proximal  hydroureter but no hydronephrosis, and some renal scarring. There is also a nonobstructive 3 mm right kidney lower pole calculus. 3. Aortic Atherosclerosis (ICD10-I70.0). Mild cardiomegaly. Ascending aortic ectasia. 4. Other imaging findings of potential clinical significance: Emphysema (ICD10-J43.9). Chronic biliary dilatation. Transverse duodenum diverticulum. Lumbar spondylosis and degenerative disc disease.   Electronically Signed   By: Van Clines M.D.   On: 08/24/2017 10:00  Results for YANITZA, SHVARTSMAN (MRN [id number]) as of 03/10/2018 08:39  Ref. Range 01/08/2014 11:50 06/15/2015 11:11 01/12/2016 11:19 02/23/2017 10:30 03/06/2018 13:47  CA 27.29 Latest Ref Range: 0.0 - 38.6 U/mL 14.3 17.5 21.2 19.5   CA 27.29 Latest Ref Range: 0.0 - 38.6 U/mL     41.4 (H)     RADIOGRAPHIC STUDIES: I have personally reviewed the radiological images as listed and agreed with the findings in the report. No results found.   ASSESSMENT & PLAN:   Malignant neoplasm of overlapping sites of left breast in female, estrogen receptor positive ([place]) # Left breast cancer with chest wall ipsilateral recurrence-  ER positive HER-2/neu negative- status post excision/mastectomy followed by radiation.  Currently on Arimidex; stable.  #Clinically no evidence of recurrence [see discussion below]; most recent tumor marker in April 2019 within normal limits.  Tumor marker from today's pending.  #Right breast keloid-clinically.  Monitor for now.  If worse recommend further imaging/will review with Dr. Bary Castilla.   # Mild chronic Anemia- 11; last colo 2016 April [Dr.Byrnett].  Stable.  #Osteopenia in the bone density test [ BMD- Jan 2017]; on ca+vit D; stable.  # Elevated Blood pressure-systolic 449P.  Recommend checking BP at home- better controlled 150s/ 70s.  # DISPOSITION: # follow up in 2 months/ no labs- Dr.B  Addendum: CA 27-29-elevated 56.  Recommend repeating the tumor marker in  1 month.  Family will be informed.     [name], MD 11/03/2018 6:37 PM

## 2018-10-28 NOTE — Progress Notes (Signed)
Here for follow up.  Per pt has noticed darkened -2 dollar coin size -mottled and mildly raised -itches per pt  Between breasts. Per pt has had this more than 1 y

## 2018-10-29 LAB — CANCER ANTIGEN 27.29: CA 27.29: 56.8 U/mL — ABNORMAL HIGH (ref 0.0–38.6)

## 2018-11-03 ENCOUNTER — Telehealth: Payer: Self-pay | Admitting: Internal Medicine

## 2018-11-03 NOTE — Telephone Encounter (Signed)
Jessica Compton/Jessica Compton -please inform patient's daughter -that CA 27-29-elevated 66.  Recommend repeating the tumor marker in 1 month.  And also schedule a bone density test that was ordered approximately 6 months ago.  Keep appointment with me in 2 months as already scheduled.Thanks.  GB

## 2018-11-27 ENCOUNTER — Inpatient Hospital Stay: Payer: Medicare Other | Attending: Internal Medicine

## 2018-11-27 ENCOUNTER — Other Ambulatory Visit: Payer: Self-pay | Admitting: Internal Medicine

## 2018-11-27 ENCOUNTER — Other Ambulatory Visit: Payer: Self-pay

## 2018-11-27 DIAGNOSIS — C50812 Malignant neoplasm of overlapping sites of left female breast: Secondary | ICD-10-CM

## 2018-11-27 DIAGNOSIS — Z170 Estrogen receptor positive status [ER+]: Principal | ICD-10-CM

## 2018-11-28 LAB — CANCER ANTIGEN 27.29: CA 27.29: 63 U/mL — ABNORMAL HIGH (ref 0.0–38.6)

## 2018-12-03 ENCOUNTER — Telehealth: Payer: Self-pay | Admitting: Internal Medicine

## 2018-12-03 DIAGNOSIS — Z170 Estrogen receptor positive status [ER+]: Principal | ICD-10-CM

## 2018-12-03 DIAGNOSIS — C50812 Malignant neoplasm of overlapping sites of left female breast: Secondary | ICD-10-CM

## 2018-12-03 DIAGNOSIS — R978 Other abnormal tumor markers: Secondary | ICD-10-CM

## 2018-12-03 NOTE — Telephone Encounter (Signed)
Heather/Brooke please inform patient's daughter that-tumor marker continues to be elevated CA-27-29 at 63.   I would recommend CT scan of the chest and pelvis with contrast in the next 1 week or so; follow-up with me few days later. I have ordered the CT scans.

## 2018-12-03 NOTE — Telephone Encounter (Signed)
Spoke with Patient's daughter Helene Kelp. Daughter unable to to transport patient to apts on a Tuesday/Thursday unless the scan is late afternoon-after 4pm. Given the fact, the patient will need to be fasting prior to this test, the daughter prefers that the scan be in the am on any day but Tues/Thursday.  Colette, please arrange. Thanks.

## 2018-12-03 NOTE — Progress Notes (Signed)
FYI- pts daughter informed of the TM; recommend CT scans; and follow up thereafter.

## 2018-12-10 ENCOUNTER — Ambulatory Visit: Payer: Medicare Other

## 2018-12-13 ENCOUNTER — Ambulatory Visit
Admission: RE | Admit: 2018-12-13 | Discharge: 2018-12-13 | Disposition: A | Discharge status: Home / Self Care | Payer: Medicare Other | Source: Ambulatory Visit | Attending: Internal Medicine | Admitting: Internal Medicine

## 2018-12-13 DIAGNOSIS — R978 Other abnormal tumor markers: Secondary | ICD-10-CM | POA: Insufficient documentation

## 2018-12-13 DIAGNOSIS — C50812 Malignant neoplasm of overlapping sites of left female breast: Secondary | ICD-10-CM | POA: Insufficient documentation

## 2018-12-13 DIAGNOSIS — Z170 Estrogen receptor positive status [ER+]: Secondary | ICD-10-CM | POA: Diagnosis present

## 2018-12-13 LAB — POCT I-STAT CREATININE: Creatinine, Ser: 1.1 mg/dL — ABNORMAL HIGH (ref 0.44–1.00)

## 2018-12-13 MED ORDER — IOPAMIDOL (ISOVUE-300) INJECTION 61%
100.0000 mL | Freq: Once | INTRAVENOUS | Status: AC | PRN
  Administered 2018-12-13: 100 mL via INTRAVENOUS

## 2018-12-16 ENCOUNTER — Telehealth: Payer: Self-pay | Admitting: Internal Medicine

## 2018-12-16 ENCOUNTER — Ambulatory Visit
Admission: RE | Admit: 2018-12-16 | Discharge: 2018-12-16 | Disposition: A | Discharge status: Home / Self Care | Payer: Medicare Other | Source: Ambulatory Visit | Attending: Internal Medicine | Admitting: Internal Medicine

## 2018-12-16 DIAGNOSIS — C50812 Malignant neoplasm of overlapping sites of left female breast: Secondary | ICD-10-CM

## 2018-12-16 DIAGNOSIS — Z79811 Long term (current) use of aromatase inhibitors: Secondary | ICD-10-CM | POA: Insufficient documentation

## 2018-12-16 DIAGNOSIS — Z170 Estrogen receptor positive status [ER+]: Principal | ICD-10-CM

## 2018-12-16 NOTE — Telephone Encounter (Signed)
Spoke to patient's daughter Helene Kelp about the results of the CT scan; concerning for progression.  Recommend PET scan for further evaluation ASAP  #Please schedule a PET scan ASAP; please keep the follow-up with me as planned on January 6.

## 2018-12-23 ENCOUNTER — Ambulatory Visit: Payer: Medicare Other

## 2018-12-27 ENCOUNTER — Ambulatory Visit
Admission: RE | Admit: 2018-12-27 | Discharge: 2018-12-27 | Disposition: A | Discharge status: Home / Self Care | Payer: Medicare HMO | Source: Ambulatory Visit | Attending: Internal Medicine | Admitting: Internal Medicine

## 2018-12-27 DIAGNOSIS — K5730 Diverticulosis of large intestine without perforation or abscess without bleeding: Secondary | ICD-10-CM | POA: Insufficient documentation

## 2018-12-27 DIAGNOSIS — R590 Localized enlarged lymph nodes: Secondary | ICD-10-CM | POA: Insufficient documentation

## 2018-12-27 DIAGNOSIS — I2510 Atherosclerotic heart disease of native coronary artery without angina pectoris: Secondary | ICD-10-CM | POA: Insufficient documentation

## 2018-12-27 DIAGNOSIS — Z9012 Acquired absence of left breast and nipple: Secondary | ICD-10-CM | POA: Insufficient documentation

## 2018-12-27 DIAGNOSIS — I700 Atherosclerosis of aorta: Secondary | ICD-10-CM | POA: Insufficient documentation

## 2018-12-27 DIAGNOSIS — C50812 Malignant neoplasm of overlapping sites of left female breast: Secondary | ICD-10-CM | POA: Diagnosis not present

## 2018-12-27 DIAGNOSIS — C50912 Malignant neoplasm of unspecified site of left female breast: Secondary | ICD-10-CM | POA: Diagnosis not present

## 2018-12-27 DIAGNOSIS — R918 Other nonspecific abnormal finding of lung field: Secondary | ICD-10-CM | POA: Diagnosis not present

## 2018-12-27 DIAGNOSIS — Z170 Estrogen receptor positive status [ER+]: Secondary | ICD-10-CM | POA: Insufficient documentation

## 2018-12-27 LAB — GLUCOSE, CAPILLARY: GLUCOSE-CAPILLARY: 86 mg/dL (ref 70–99)

## 2018-12-27 MED ORDER — FLUDEOXYGLUCOSE F - 18 (FDG) INJECTION
7.6200 | Freq: Once | INTRAVENOUS | Status: AC | PRN
  Administered 2018-12-27: 7.62 via INTRAVENOUS

## 2018-12-30 ENCOUNTER — Inpatient Hospital Stay: Payer: Medicare HMO | Attending: Internal Medicine | Admitting: Internal Medicine

## 2018-12-30 ENCOUNTER — Telehealth: Payer: Self-pay | Admitting: Pharmacist

## 2018-12-30 ENCOUNTER — Encounter: Payer: Self-pay | Admitting: Internal Medicine

## 2018-12-30 VITALS — BP 146/84 | HR 76 | Resp 16 | Wt 154.0 lb

## 2018-12-30 DIAGNOSIS — I10 Essential (primary) hypertension: Secondary | ICD-10-CM

## 2018-12-30 DIAGNOSIS — F17210 Nicotine dependence, cigarettes, uncomplicated: Secondary | ICD-10-CM | POA: Diagnosis not present

## 2018-12-30 DIAGNOSIS — Z79899 Other long term (current) drug therapy: Secondary | ICD-10-CM | POA: Insufficient documentation

## 2018-12-30 DIAGNOSIS — Z5111 Encounter for antineoplastic chemotherapy: Secondary | ICD-10-CM | POA: Diagnosis not present

## 2018-12-30 DIAGNOSIS — R7989 Other specified abnormal findings of blood chemistry: Secondary | ICD-10-CM | POA: Insufficient documentation

## 2018-12-30 DIAGNOSIS — R630 Anorexia: Secondary | ICD-10-CM | POA: Insufficient documentation

## 2018-12-30 DIAGNOSIS — E119 Type 2 diabetes mellitus without complications: Secondary | ICD-10-CM | POA: Diagnosis not present

## 2018-12-30 DIAGNOSIS — Z9012 Acquired absence of left breast and nipple: Secondary | ICD-10-CM | POA: Insufficient documentation

## 2018-12-30 DIAGNOSIS — R5383 Other fatigue: Secondary | ICD-10-CM | POA: Insufficient documentation

## 2018-12-30 DIAGNOSIS — C50812 Malignant neoplasm of overlapping sites of left female breast: Secondary | ICD-10-CM | POA: Diagnosis not present

## 2018-12-30 DIAGNOSIS — Z170 Estrogen receptor positive status [ER+]: Secondary | ICD-10-CM

## 2018-12-30 DIAGNOSIS — Z9221 Personal history of antineoplastic chemotherapy: Secondary | ICD-10-CM | POA: Insufficient documentation

## 2018-12-30 DIAGNOSIS — Z79811 Long term (current) use of aromatase inhibitors: Secondary | ICD-10-CM | POA: Insufficient documentation

## 2018-12-30 DIAGNOSIS — M8580 Other specified disorders of bone density and structure, unspecified site: Secondary | ICD-10-CM | POA: Diagnosis not present

## 2018-12-30 DIAGNOSIS — R251 Tremor, unspecified: Secondary | ICD-10-CM | POA: Diagnosis not present

## 2018-12-30 DIAGNOSIS — R978 Other abnormal tumor markers: Secondary | ICD-10-CM

## 2018-12-30 MED ORDER — ABEMACICLIB 100 MG PO TABS: 100.0000 mg | ORAL_TABLET | Freq: Two times a day (BID) | ORAL | 6 refills | Status: DC

## 2018-12-30 NOTE — Progress Notes (Signed)
START ON PATHWAY REGIMEN - Breast     A cycle is every 28 days:     Abemaciclib      Fulvestrant      Fulvestrant   **Always confirm dose/schedule in your pharmacy ordering system**  Patient Characteristics: Distant Metastases or Locoregional Recurrent Disease - Unresected or Locally Advanced Unresectable Disease Progressing after Neoadjuvant and Local Therapies, HER2 Negative/Unknown/Equivocal, ER Positive, Endocrine Therapy, Postmenopausal, Second Line,  Candidate for a CDK4/6 Inhibitor Therapeutic Status: Distant Metastases BRCA Mutation Status: Quantity Not Sufficient ER Status: Positive (+) HER2 Status: Negative (-) PR Status: Negative (-) Menopausal Status: Postmenopausal Line of Therapy: Second Line Intent of Therapy: Non-Curative / Palliative Intent, Discussed with Patient

## 2018-12-30 NOTE — Assessment & Plan Note (Addendum)
#  Left breast ipsilateral chest wall recurrence stage IV [2015]; currently on anastrozole.  However currently is noted to have rising tumor marker; PET scan suggestive of mediastinal/hilar adenopathy; single right iliac bone lesion.  Concerning for progressive disease.  #Given the progressive disease recommend switching therapies to Faslodex plus abema.  Reviewed the potential mechanism of action and the potential side effects in detail including but not limited to hot flashes arthralgias injection site pain; and also diarrhea low blood counts fatigue.  #Clinically no evidence of recurrence [see discussion below]; most recent tumor marker in April 2019 within normal limits.  Tumor marker from today's pending.  #We will plan to start the patient on abema 100 mg twice daily along with Faslodex.   After lengthy discussion weighing risk versus benefits patient agrees to proceed with treatment as discussed above.  They understand treatments are palliative not curative.  #Osteopenia in the bone density test [ BMD- Jan 2017]; on ca+vit D; stable.  # Elevated Blood pressure-blood pressure better control at 859 systolic.  # 40 minutes face-to-face with the patient discussing the above plan of care; more than 50% of time spent on prognosis/ natural history; counseling and coordination.  # I reviewed the blood work- with the patient in detail; also reviewed the imaging independently [as summarized above]; and with the patient in detail.  We also reviewed the tumor conference/possible biopsy.  # DISPOSITION: # faslodex injection in 1 week # follow up with MD/labs- cbc/cmp/faslodex in 3 weeks-Dr.B

## 2018-12-30 NOTE — Telephone Encounter (Signed)
Oral Oncology Pharmacist Encounter  Received new prescription for Verzenio (abemaciclib) for the treatment of ER/PR positive metastatic breast cancer in conjunction with fulvestrant, planned duration until disease progression or unacceptable drug toxicity.  CBC/CMP from 10/28/18 assessed, no relevant lab abnormalities. Prescription dose and frequency assessed.   Current medication list in Epic reviewed, no DDIs with abemaciclib identified.  Prescription has been e-scribed to the [facility] for benefits analysis and approval.  Patient education Counseled patient and her daughters following their office visit. Reviewed administration, dosing, side effects, monitoring, drug-food interactions, safe handling, storage, and disposal. Patient will take 1 tablet (100 mg total) by mouth 2 (two) times daily.  Side effects include but not limited to: diarrhea, decreased hgb/plt/wbc, fatigue, N/V.    Reviewed with patient importance of keeping a medication schedule and plan for any missed doses.  Ms. Bodin voiced understanding and appreciation. All questions answered. Medication handout provided and consent obtained.  Oral Oncology Clinic will continue to follow for insurance authorization, copayment issues, and start date.  Provided patient with [facility] phone number. Patient knows to call the office with questions or concerns. Oral Chemotherapy Navigation Clinic will continue to follow.  [name], PharmD, BCPS, [facility] Hematology/Oncology Clinical Pharmacist [facility]/HP/AP [facility] [phone]  12/30/2018 5:06 PM

## 2018-12-30 NOTE — Progress Notes (Signed)
Patient on plan of care prior to pathways. 

## 2018-12-30 NOTE — Progress Notes (Signed)
[facility] OFFICE PROGRESS NOTE  Patient Care Team: Sims, Charles Colston, MD as PCP - General (Internal Medicine) Byrnett, Jeffrey W, MD (General Surgery) Eason, Ernest B, MD (Internal Medicine)  Dr.Charles Sims; [facility]  SUMMARY OF ONCOLOGIC HISTORY:  Oncology History   # 2007- LEFT BREAST CA STAGE II [T2N1mi; s/p mastec; Dr.Byrnett] ER-Pos/PR Neg; Her- 2 Neu- NEG; ACx4-Taxol x12;Femara; stopped May 2009-stopped follow up.  #JAN 2015- Post mastectomy Chest wall recurrence [chest wall mass bx-]; ER- 90%; PR- NEG; Her2 Neu- NEG; s/p excision [involving skeletal muscle/adipose tissue; clear margins]; CT July 2017- NED  # OSTEOPENIA [BMD- June 2016]  # Kidney cysts  DIAGNOSIS: LEFT BREAST CA  STAGE: IV- NED  ;GOALS: control  CURRENT/MOST RECENT THERAPY: on arimidex      Breast cancer metastasized to skin (HCC)   02/05/2014 Initial Diagnosis    Breast cancer metastasized to skin (HCC)     Malignant neoplasm of overlapping sites of left breast in female, estrogen receptor positive (HCC)   07/24/2016 Initial Diagnosis    Cancer of overlapping sites of left female breast (HCC)    01/06/2019 -  Chemotherapy    The patient had fulvestrant (FASLODEX) injection 500 mg, 500 mg, Intramuscular,  Once, 0 of 4 cycles Dose modification: 500 mg (original dose 500 mg, Cycle 1)  for chemotherapy treatment.       INTERVAL HISTORY:  A very pleasant 78-year-old African-American female patient with above history of ER/PR positive breast cancer status with chest wall recurrence in January 2015 currently on Arimidex since for follow-up/review the results of the PET scan.  Patient noted to have rising tumor marker which led to a CT scan that was concerning for mediastinal/hilar recurrence.  Patient is here today to review the results of the PET scan.  Patient complains of mild fatigue.  Otherwise denies any unusual cough or chest pain or shortness of breath.  She denies  any pain.  Chronic joint pains.  Review of Systems  Constitutional: Positive for malaise/fatigue. Negative for chills, diaphoresis, fever and weight loss.  HENT: Negative for nosebleeds and sore throat.   Eyes: Negative for double vision.  Respiratory: Negative for cough, hemoptysis, sputum production, shortness of breath and wheezing.   Cardiovascular: Negative for chest pain, palpitations, orthopnea and leg swelling.  Gastrointestinal: Negative for abdominal pain, blood in stool, constipation, diarrhea, heartburn, melena, nausea and vomiting.  Genitourinary: Negative for dysuria, frequency and urgency.  Musculoskeletal: Positive for back pain and joint pain.  Skin: Negative.  Negative for itching and rash.  Neurological: Negative for dizziness, tingling, focal weakness, weakness and headaches.  Endo/Heme/Allergies: Does not bruise/bleed easily.  Psychiatric/Behavioral: Negative for depression. The patient is not nervous/anxious and does not have insomnia.      PAST MEDICAL HISTORY :  Past Medical History:  Diagnosis Date  . Aneurysm (HCC) 2007  . Arthritis   . Cancer (HCC) 2007   diagnosed 01/07 left breast with simple mastectomu & sn bx. The pt had a 3.5cm tumor. Frozen section report on the 2 sn were negative. On permanent sections a 0.5mm micrometastasis was identified. She was not felt to require a complete axillary dissection. She has completed her Tamoxifen therapy and has been released from routine f/u with medical oncology service.  . Diabetes mellitus without complication (HCC)   . Hyperlipidemia   . Hypertension    since age 21  . Malignant neoplasm of upper-outer quadrant of female breast (HCC) January 2015   Mastectomy site   recurrence resected 01/26/2014, ER 90%, PR 0%, HER-2/neu nonamplified.  . Other benign neoplasm of connective and other soft tissue of trunk, unspecified 2014  . Personal history of colonic polyps   . Personal history of tobacco use, presenting  hazards to health   . Special screening for malignant neoplasms, colon   . Unspecified disorder of skin and subcutaneous tissue 02/19/2013   biopsy of skin over the sternum on the right breast showed hypertrophic scar,keloid formation.    PAST SURGICAL HISTORY :   Past Surgical History:  Procedure Laterality Date  . ABDOMINAL HYSTERECTOMY  2004   total  . BREAST BIOPSY Right January 26, 2014   Core biopsy for mild increase breast uptake on PET scan, usual ductal hyperplasia and stromal fibrosis  . BREAST SURGERY Left 2007   mastectomy  . chest wall mass  01/26/14  . CHOLECYSTECTOMY  2007  . COLONOSCOPY W/ POLYPECTOMY  2011   Dr. Eason  . EYE SURGERY Right    cataract surgery  . head surgery  1991  . MASTECTOMY Left 2007  . PORT-A-CATH REMOVAL  2008  . PORTACATH PLACEMENT  2007    FAMILY HISTORY :   Family History  Problem Relation Age of Onset  . Cancer Other        ovarian,breast,colon cancers;relations not listed  . Breast cancer Neg Hx     SOCIAL HISTORY:   Social History   Tobacco Use  . Smoking status: Current Some Day Smoker    Packs/day: 1.00    Years: 50.00    Pack years: 50.00    Types: Cigarettes  . Smokeless tobacco: Never Used  Substance Use Topics  . Alcohol use: No    Alcohol/week: 0.0 standard drinks  . Drug use: No    ALLERGIES:  is allergic to metoprolol.  MEDICATIONS:  Current Outpatient Medications  Medication Sig Dispense Refill  . anastrozole (ARIMIDEX) 1 MG tablet TAKE ONE (1) TABLET BY MOUTH EVERY DAY 90 tablet 1  . atorvastatin (LIPITOR) 20 MG tablet Take 20 mg by mouth daily at 6 PM.     . Calcium Carbonate-Vitamin D 600-200 MG-UNIT CAPS Take 200 capsules by mouth once.    . Cholecalciferol (VITAMIN D3) 1000 units CAPS Take by mouth.    . citalopram (CELEXA) 40 MG tablet Take 40 mg by mouth daily.    . gabapentin (NEURONTIN) 300 MG capsule Take 300 mg by mouth daily.    . hydrochlorothiazide (MICROZIDE) 12.5 MG capsule Take 1  capsule by mouth daily.    . lisinopril (PRINIVIL,ZESTRIL) 2.5 MG tablet Take 2.5 mg by mouth daily.    . meloxicam (MOBIC) 15 MG tablet Take 15 mg by mouth daily.    . polyethylene glycol powder (GLYCOLAX/MIRALAX) powder 255 grams one bottle for colonoscopy prep 255 g 0  . tiZANidine (ZANAFLEX) 4 MG tablet TAKE 1 TABLET BY MOUTH THREE TIMES A DAY AS NEEDED (BACK PAIN/SPASMS).    . traMADol (ULTRAM) 50 MG tablet Take by mouth.    . abemaciclib (VERZENIO) 100 MG tablet Take 1 tablet (100 mg total) by mouth 2 (two) times daily. 60 tablet 6   No current facility-administered medications for this visit.     PHYSICAL EXAMINATION: ECOG PERFORMANCE STATUS: 0 - Asymptomatic  BP (!) 146/84 (BP Location: Right Arm, Patient Position: Sitting, Cuff Size: Normal)   Pulse 76   Resp 16   Wt 154 lb (69.9 kg)   BMI 25.63 kg/m   Filed Weights   12/30/18   1444  Weight: 154 lb (69.9 kg)    Physical Exam  Constitutional: She is oriented to person, place, and time and well-developed, well-nourished, and in no distress.  Accompanied by daughter.  She is walking by self.  HENT:  Head: Normocephalic and atraumatic.  Mouth/Throat: Oropharynx is clear and moist. No oropharyngeal exudate.  Eyes: Pupils are equal, round, and reactive to light.  Neck: Normal range of motion. Neck supple.  Cardiovascular: Normal rate and regular rhythm.  Pulmonary/Chest: No respiratory distress. She has no wheezes.  Abdominal: Soft. Bowel sounds are normal. She exhibits no distension and no mass. There is no abdominal tenderness. There is no rebound and no guarding.  Musculoskeletal: Normal range of motion.        General: No tenderness or edema.  Neurological: She is alert and oriented to person, place, and time.  Skin: Skin is warm.  Keloid noted in the right inner lower quadrant of the breast.  No new lumps or bumps noted.  Psychiatric: Affect normal.     LABORATORY DATA:  I have reviewed the data as listed     Component Value Date/Time   NA 139 10/28/2018 1350   NA 139 05/30/2014 0127   K 3.5 10/28/2018 1350   K 3.7 05/30/2014 0127   CL 100 10/28/2018 1350   CL 106 05/30/2014 0127   CO2 29 10/28/2018 1350   CO2 25 05/30/2014 0127   GLUCOSE 160 (H) 10/28/2018 1350   GLUCOSE 136 (H) 05/30/2014 0127   BUN 19 10/28/2018 1350   BUN 19 (H) 05/30/2014 0127   CREATININE 1.10 (H) 12/13/2018 0910   CREATININE 0.98 05/30/2014 0127   CALCIUM 9.4 10/28/2018 1350   CALCIUM 9.0 05/30/2014 0127   PROT 7.9 10/28/2018 1350   PROT 7.8 05/08/2014 1131   ALBUMIN 4.2 10/28/2018 1350   ALBUMIN 3.7 05/08/2014 1131   AST 23 10/28/2018 1350   AST 20 05/08/2014 1131   ALT 18 10/28/2018 1350   ALT 18 05/08/2014 1131   ALKPHOS 72 10/28/2018 1350   ALKPHOS 58 05/08/2014 1131   BILITOT 0.4 10/28/2018 1350   BILITOT 0.4 05/08/2014 1131   GFRNONAA 53 (L) 10/28/2018 1350   GFRNONAA 58 (L) 05/30/2014 0127   GFRAA >60 10/28/2018 1350   GFRAA >60 05/30/2014 0127    No results found for: SPEP, UPEP  Lab Results  Component Value Date   WBC 7.3 10/28/2018   NEUTROABS 4.2 10/28/2018   HGB 11.7 (L) 10/28/2018   HCT 37.4 10/28/2018   MCV 90.6 10/28/2018   PLT 242 10/28/2018      Chemistry      Component Value Date/Time   NA 139 10/28/2018 1350   NA 139 05/30/2014 0127   K 3.5 10/28/2018 1350   K 3.7 05/30/2014 0127   CL 100 10/28/2018 1350   CL 106 05/30/2014 0127   CO2 29 10/28/2018 1350   CO2 25 05/30/2014 0127   BUN 19 10/28/2018 1350   BUN 19 (H) 05/30/2014 0127   CREATININE 1.10 (H) 12/13/2018 0910   CREATININE 0.98 05/30/2014 0127      Component Value Date/Time   CALCIUM 9.4 10/28/2018 1350   CALCIUM 9.0 05/30/2014 0127   ALKPHOS 72 10/28/2018 1350   ALKPHOS 58 05/08/2014 1131   AST 23 10/28/2018 1350   AST 20 05/08/2014 1131   ALT 18 10/28/2018 1350   ALT 18 05/08/2014 1131   BILITOT 0.4 10/28/2018 1350   BILITOT 0.4 05/08/2014 1131          RADIOGRAPHIC STUDIES: I have  personally reviewed the radiological images as listed and agreed with the findings in the report. No results found.   ASSESSMENT & PLAN:   Malignant neoplasm of overlapping sites of left breast in female, estrogen receptor positive ([place]) #Left breast ipsilateral chest wall recurrence stage IV [2015]; currently on anastrozole.  However currently is noted to have rising tumor marker; PET scan suggestive of mediastinal/hilar adenopathy; single right iliac bone lesion.  Concerning for progressive disease.  #Given the progressive disease recommend switching therapies to Faslodex plus abema.  Reviewed the potential mechanism of action and the potential side effects in detail including but not limited to hot flashes arthralgias injection site pain; and also diarrhea low blood counts fatigue.  #Clinically no evidence of recurrence [see discussion below]; most recent tumor marker in April 2019 within normal limits.  Tumor marker from today's pending.  #We will plan to start the patient on abema 100 mg twice daily along with Faslodex.   After lengthy discussion weighing risk versus benefits patient agrees to proceed with treatment as discussed above.  They understand treatments are palliative not curative.  #Osteopenia in the bone density test [ BMD- Jan 2017]; on ca+vit D; stable.  # Elevated Blood pressure-blood pressure better control at 295 systolic.  # 40 minutes face-to-face with the patient discussing the above plan of care; more than 50% of time spent on prognosis/ natural history; counseling and coordination.  # I reviewed the blood work- with the patient in detail; also reviewed the imaging independently [as summarized above]; and with the patient in detail.  We also reviewed the tumor conference/possible biopsy.  # DISPOSITION: # faslodex injection in 1 week # follow up with MD/labs- cbc/cmp/faslodex in 3 weeks-Dr.B     [name], MD 12/30/2018 5:45 PM

## 2018-12-31 ENCOUNTER — Telehealth: Payer: Self-pay | Admitting: Pharmacy Technician

## 2018-12-31 MED ORDER — ABEMACICLIB 100 MG PO TABS: 100.0000 mg | ORAL_TABLET | Freq: Two times a day (BID) | ORAL | 6 refills | Status: DC

## 2018-12-31 NOTE — Telephone Encounter (Signed)
Oral Oncology Patient Advocate Encounter  After completing a benefits investigation, [employer] is not requiring a prior authorization for Verzenio.  Patient's copay for Verzenio is $3.90.  Patient was approved for a grant through [employer] (TAF) that will reduce her copay to $0.  [place] Patient [place] Phone [phone] Fax [phone] 12/31/2018 10:30 AM

## 2018-12-31 NOTE — Telephone Encounter (Signed)
Oral Oncology Patient Advocate Encounter  Patient has been approved for copay assistance with [employer] (TAF).    [employer] [place] will cover all copayment expenses for Verzenio for the remainder of the calendar year.    The billing information is as follows and has been shared with [facility].   Member ID: [id number] Group ID: [id number] PCN: [id number] BIN: [id number] Eligibility Dates:  12/25/2018 to 12/25/2019   [place] Patient [place] Phone [phone] Fax [phone] 12/31/2018 10:36 AM

## 2018-12-31 NOTE — Telephone Encounter (Signed)
Oral Chemotherapy Pharmacist Encounter   Ms. Jessica Compton's Verzenio is ready to be filled and sent to her. I informed Dr. Rogue Bussing and he would like for her to start the Verzenio in one week. He would also like for her to stop her anastrozole because she is started Faslodex next week Anastrozole was removed from her medication list. Spoke with Jessica Compton, daughter of Ms. Jessica Compton., and gave her the above instructions for starting Verzenio. Jessica Compton  started her understanding.  [name], PharmD, BCPS, [facility] Hematology/Oncology Clinical Pharmacist [facility]/AP [facility] [phone]  12/31/2018 4:33 PM

## 2018-12-31 NOTE — Telephone Encounter (Signed)
Scheduled delivery of Verzenio for 01/02/19.  Daughter is aware that patient should start medication in 1 week.

## 2019-01-01 MED FILL — VERZENIO 100 MG TAB: 100 | 28 days supply | Qty: 56 | Fill #0

## 2019-01-02 ENCOUNTER — Other Ambulatory Visit: Payer: Medicare HMO

## 2019-01-02 ENCOUNTER — Other Ambulatory Visit: Payer: Self-pay | Admitting: Internal Medicine

## 2019-01-02 NOTE — Progress Notes (Signed)
Faslodex ordered.  Heather/Brooke please inform patient's daughter that case reviewed the tumor conference-would not recommend biopsy; proceed with Faslodex plus abema as previously discussed/follow up as planned. GB

## 2019-01-02 NOTE — Progress Notes (Signed)
Tumor Board Documentation  Jessica Compton was presented by Dr Rogue Bussing at our Tumor Board on 01/02/2019, which included representatives from medical oncology, radiation oncology, surgical oncology, internal medicine, navigation, pathology, radiology, surgical, research, palliative care, pulmonology.  Jessica Compton currently presents as a current patient with history of the following treatments: adjuvant chemotherapy.  Additionally, we reviewed previous medical and familial history, history of present illness, and recent lab results along with all available histopathologic and imaging studies. The tumor board considered available treatment options and made the following recommendations:   Nothing to worry about to biopsy in the lungs, continue Faslodex and Verzenio  The following procedures/referrals were also placed: No orders of the defined types were placed in this encounter.   Clinical Trial Status: not discussed   Staging used: AJCC Stage Group   National site-specific guidelines NCCN were discussed with respect to the case.  Tumor board is a meeting of clinicians from various specialty areas who evaluate and discuss patients for whom a multidisciplinary approach is being considered. Final determinations in the plan of care are those of the provider(s). The responsibility for follow up of recommendations given during tumor board is that of the provider.   Today's extended care, comprehensive team conference, Jessica Compton was not present for the discussion and was not examined.   Multidisciplinary Tumor Board is a multidisciplinary case peer review process.  Decisions discussed in the Multidisciplinary Tumor Board reflect the opinions of the specialists present at the conference without having examined the patient.  Ultimately, treatment and diagnostic decisions rest with the primary provider(s) and the patient.

## 2019-01-02 NOTE — Progress Notes (Signed)
I left message for patient's daughter to return phone call.  

## 2019-01-06 ENCOUNTER — Inpatient Hospital Stay: Payer: Medicare HMO

## 2019-01-06 DIAGNOSIS — Z9012 Acquired absence of left breast and nipple: Secondary | ICD-10-CM | POA: Diagnosis not present

## 2019-01-06 DIAGNOSIS — R630 Anorexia: Secondary | ICD-10-CM | POA: Diagnosis not present

## 2019-01-06 DIAGNOSIS — Z9221 Personal history of antineoplastic chemotherapy: Secondary | ICD-10-CM | POA: Diagnosis not present

## 2019-01-06 DIAGNOSIS — Z79811 Long term (current) use of aromatase inhibitors: Secondary | ICD-10-CM | POA: Diagnosis not present

## 2019-01-06 DIAGNOSIS — C50812 Malignant neoplasm of overlapping sites of left female breast: Secondary | ICD-10-CM

## 2019-01-06 DIAGNOSIS — M8580 Other specified disorders of bone density and structure, unspecified site: Secondary | ICD-10-CM | POA: Diagnosis not present

## 2019-01-06 DIAGNOSIS — Z170 Estrogen receptor positive status [ER+]: Secondary | ICD-10-CM | POA: Diagnosis not present

## 2019-01-06 DIAGNOSIS — F17210 Nicotine dependence, cigarettes, uncomplicated: Secondary | ICD-10-CM | POA: Diagnosis not present

## 2019-01-06 DIAGNOSIS — Z5111 Encounter for antineoplastic chemotherapy: Secondary | ICD-10-CM | POA: Diagnosis not present

## 2019-01-06 MED ORDER — FULVESTRANT 250 MG/5ML IM SOLN
500.0000 mg | Freq: Once | INTRAMUSCULAR | Status: AC
  Administered 2019-01-06: 500 mg via INTRAMUSCULAR

## 2019-01-06 NOTE — Progress Notes (Signed)
Patient and patient's daughter notified of these results and Dr. Sharmaine Base recommendations*. Patient and patient's daughter verbalized understanding.

## 2019-01-06 NOTE — Progress Notes (Signed)
Patient and patient's daughter notified of these lab results and patient verbalized understanding

## 2019-01-08 ENCOUNTER — Other Ambulatory Visit: Payer: Self-pay | Admitting: *Deleted

## 2019-01-08 DIAGNOSIS — Z853 Personal history of malignant neoplasm of breast: Secondary | ICD-10-CM

## 2019-01-08 DIAGNOSIS — Z170 Estrogen receptor positive status [ER+]: Secondary | ICD-10-CM

## 2019-01-08 DIAGNOSIS — C50812 Malignant neoplasm of overlapping sites of left female breast: Secondary | ICD-10-CM

## 2019-01-22 ENCOUNTER — Other Ambulatory Visit: Payer: Self-pay

## 2019-01-22 ENCOUNTER — Inpatient Hospital Stay: Payer: Medicare HMO

## 2019-01-22 ENCOUNTER — Encounter: Payer: Self-pay | Admitting: Internal Medicine

## 2019-01-22 ENCOUNTER — Inpatient Hospital Stay (HOSPITAL_BASED_OUTPATIENT_CLINIC_OR_DEPARTMENT_OTHER): Payer: Medicare HMO | Admitting: Internal Medicine

## 2019-01-22 VITALS — BP 145/78 | HR 80 | Temp 98.0°F | Resp 16 | Wt 152.8 lb

## 2019-01-22 DIAGNOSIS — Z170 Estrogen receptor positive status [ER+]: Principal | ICD-10-CM

## 2019-01-22 DIAGNOSIS — M8580 Other specified disorders of bone density and structure, unspecified site: Secondary | ICD-10-CM

## 2019-01-22 DIAGNOSIS — F17210 Nicotine dependence, cigarettes, uncomplicated: Secondary | ICD-10-CM | POA: Diagnosis not present

## 2019-01-22 DIAGNOSIS — Z5111 Encounter for antineoplastic chemotherapy: Secondary | ICD-10-CM | POA: Diagnosis not present

## 2019-01-22 DIAGNOSIS — Z9221 Personal history of antineoplastic chemotherapy: Secondary | ICD-10-CM

## 2019-01-22 DIAGNOSIS — C50812 Malignant neoplasm of overlapping sites of left female breast: Secondary | ICD-10-CM | POA: Diagnosis not present

## 2019-01-22 DIAGNOSIS — R251 Tremor, unspecified: Secondary | ICD-10-CM

## 2019-01-22 DIAGNOSIS — R5383 Other fatigue: Secondary | ICD-10-CM

## 2019-01-22 DIAGNOSIS — Z9012 Acquired absence of left breast and nipple: Secondary | ICD-10-CM | POA: Diagnosis not present

## 2019-01-22 DIAGNOSIS — I10 Essential (primary) hypertension: Secondary | ICD-10-CM

## 2019-01-22 DIAGNOSIS — R7989 Other specified abnormal findings of blood chemistry: Secondary | ICD-10-CM

## 2019-01-22 DIAGNOSIS — R630 Anorexia: Secondary | ICD-10-CM | POA: Diagnosis not present

## 2019-01-22 DIAGNOSIS — Z79899 Other long term (current) drug therapy: Secondary | ICD-10-CM

## 2019-01-22 DIAGNOSIS — E119 Type 2 diabetes mellitus without complications: Secondary | ICD-10-CM

## 2019-01-22 DIAGNOSIS — Z79811 Long term (current) use of aromatase inhibitors: Secondary | ICD-10-CM | POA: Diagnosis not present

## 2019-01-22 LAB — TSH: TSH: 2.47 u[IU]/mL (ref 0.350–4.500)

## 2019-01-22 LAB — COMPREHENSIVE METABOLIC PANEL
ALBUMIN: 3.9 g/dL (ref 3.5–5.0)
ALT: 12 U/L (ref 0–44)
AST: 20 U/L (ref 15–41)
Alkaline Phosphatase: 55 U/L (ref 38–126)
Anion gap: 9 (ref 5–15)
BUN: 25 mg/dL — ABNORMAL HIGH (ref 8–23)
CO2: 26 mmol/L (ref 22–32)
Calcium: 9.1 mg/dL (ref 8.9–10.3)
Chloride: 104 mmol/L (ref 98–111)
Creatinine, Ser: 1.58 mg/dL — ABNORMAL HIGH (ref 0.44–1.00)
GFR calc non Af Amer: 31 mL/min — ABNORMAL LOW (ref >60)
GFR, EST AFRICAN AMERICAN: 36 mL/min — AB (ref >60)
Glucose, Bld: 158 mg/dL — ABNORMAL HIGH (ref 70–99)
Potassium: 3.9 mmol/L (ref 3.5–5.1)
Sodium: 139 mmol/L (ref 135–145)
Total Bilirubin: 0.5 mg/dL (ref 0.3–1.2)
Total Protein: 7.7 g/dL (ref 6.5–8.1)

## 2019-01-22 LAB — CBC WITH DIFFERENTIAL/PLATELET
Abs Immature Granulocytes: 0.01 10*3/uL (ref 0.00–0.07)
Basophils Absolute: 0 10*3/uL (ref 0.0–0.1)
Basophils Relative: 1 %
Eosinophils Absolute: 0.1 10*3/uL (ref 0.0–0.5)
Eosinophils Relative: 3 %
HCT: 36.2 % (ref 36.0–46.0)
HEMOGLOBIN: 11 g/dL — AB (ref 12.0–15.0)
Immature Granulocytes: 0 %
LYMPHS ABS: 2.6 10*3/uL (ref 0.7–4.0)
Lymphocytes Relative: 49 %
MCH: 27.8 pg (ref 26.0–34.0)
MCHC: 30.4 g/dL (ref 30.0–36.0)
MCV: 91.6 fL (ref 80.0–100.0)
Monocytes Absolute: 0.3 10*3/uL (ref 0.1–1.0)
Monocytes Relative: 6 %
Neutro Abs: 2.1 10*3/uL (ref 1.7–7.7)
Neutrophils Relative %: 41 %
Platelets: 162 10*3/uL (ref 150–400)
RBC: 3.95 MIL/uL (ref 3.87–5.11)
RDW: 13.9 % (ref 11.5–15.5)
WBC: 5.2 10*3/uL (ref 4.0–10.5)
nRBC: 0 % (ref 0.0–0.2)

## 2019-01-22 MED ORDER — FULVESTRANT 250 MG/5ML IM SOLN
500.0000 mg | Freq: Once | INTRAMUSCULAR | Status: AC
  Administered 2019-01-22: 500 mg via INTRAMUSCULAR
  Filled 2019-01-22: qty 10

## 2019-01-22 NOTE — Assessment & Plan Note (Addendum)
#  Left breast ipsilateral chest wall recurrence stage IV [2015];  Jan 2020-PET scan suggestive of mediastinal/hilar adenopathy; single right iliac bone lesion.  Concerning for progressive disease.  Currently on abemaciclib plus Faslodex.  #: Abemaciclib-multiple vague complaints [tremors fatigue poor appetite].  Continue Faslodex today.  # Tremors/fatigue/poor aptetite/ no weight loss- check TSH; stop abema today.  CT head; [MRI- ? Aneurym/staples].   #Osteopenia in the bone density test [ BMD- Jan 2017]; on ca+vit D;stable.   #Elevated creatinine 1.5 baseline 0.9-1.  Hold lisinopril hydrochlorothiazide. HOLD linispril/HCTZ.  call with log.   # DISPOSITION: # add TSH today.  # faslodex injection today.  # follow up with MD/labs- cbc/cmp/in 2 weeks-Dr.B

## 2019-01-22 NOTE — Patient Instructions (Signed)
#  Stop lisinopril #Stop hydrochlorothiazide # STop verzinio.

## 2019-01-22 NOTE — Progress Notes (Signed)
[place] OFFICE PROGRESS NOTE  Patient Care Team: [name], MD as PCP - General (Internal Medicine) Bary Castilla, Forest Gleason, MD (General Surgery) Marden Noble, MD (Internal Medicine)  Dr.Charles Mayer Masker; [place] HISTORY:  Oncology History   # 2007- LEFT BREAST CA STAGE II [T2N63m; s/p mastec; Dr.Byrnett] ER-Pos/PR Neg; Her- 2 Neu- NEG; ACx4-Taxol x12;Femara; stopped May 2009-stopped follow up.  #JAN 2015- Post mastectomy Chest wall recurrence [chest wall mass bx-]; ER- 90%; PR- NEG; Her2 Neu- NEG; s/p excision [involving skeletal muscle/adipose tissue; clear margins]; CT July 2017- NED;   # Jan 2020- progression/ hilar- START abema [jan 14th]+ faslodex[jan 13th]  # OSTEOPENIA [BMD- June 2016]  # Kidney cysts  DIAGNOSIS: LEFT BREAST CA  STAGE: IV- NED  ;GOALS: pallaitive  CURRENT/MOST RECENT THERAPY: faslodex+ Abema      [place] ([place]   02/05/2014 Initial Diagnosis    Bre[place]asized to skin ([facility]     Malignant neoplasm of overlapping sites of left breast in female, estrogen receptor positive ([facility] [place]   07/24/2016 Initial Diagnosis    Cancer of overlapping sites of left female breast ([facility]    01/06/2019 -  Chemotherapy    The patient had fulvestrant (FASLODEX) injection 500 mg, 500 mg, Intramuscular,  Once, 0 of 4 cycles Dose modification: 500 mg (original dose 500 mg, Cycle 1)  for chemotherapy treatment.       INTERVAL HISTORY:  A very pleasant 78year old African-American female patient with above history of ER/PR positive breast cancer stage IV currently on abemaciclib plus Faslodex is here for follow-up.  Patient has been on this treatment for the last 2 weeks.  As per the family patient noted to have increasing tremors/gait instability.  No fall.  Denies any headaches.  No nausea no vomiting.  No diarrhea no rash.  Complains of fatigue.   Review of Systems   Constitutional: Positive for malaise/fatigue. Negative for chills, diaphoresis, fever and weight loss.  HENT: Negative for nosebleeds and sore throat.   Eyes: Negative for double vision.  Respiratory: Positive for stridor. Negative for cough, hemoptysis, sputum production, shortness of breath and wheezing.   Cardiovascular: Negative for chest pain, palpitations, orthopnea and leg swelling.  Gastrointestinal: Negative for abdominal pain, blood in stool, constipation, diarrhea, heartburn, melena, nausea and vomiting.  Genitourinary: Negative for dysuria, frequency and urgency.  Musculoskeletal: Positive for back pain and joint pain.  Skin: Negative.  Negative for itching and rash.  Neurological: Positive for dizziness and tremors. Negative for tingling, focal weakness, weakness and headaches.  Endo/Heme/Allergies: Does not bruise/bleed easily.  Psychiatric/Behavioral: Negative for depression. The patient is not nervous/anxious and does not have insomnia.      PAST MEDICAL HISTORY :  Past Medical History:  Diagnosis Date  . [place] ([facility] [place]  . Arthritis   . Cancer ([facility] 2007   diagnosed 01/07 left breast with simple mastectomu & sn bx. The pt had a 3.5cm tumor. Frozen section report on the 2 sn were negative. On permanent sections a 0.557mmicrometastasis was identified. She was not felt to require a complete axillary dissection. She has completed her Tamoxifen therapy and has been released from routine f/u with medical oncology service.  . Diabetes mellitus without complication ([facility]  . Hyperlipidemia   . Hypertension    since age 78. [facility]   Mastectomy site recurrence resected 01/26/2014, ER  90%, PR 0%, HER-2/neu nonamplified.  . Other benign neoplasm of connective and other soft tissue of trunk, unspecified 2014  . Personal history of colonic polyps   . Personal history of tobacco use, presenting hazards to  health   . Special screening for malignant neoplasms, colon   . Unspecified disorder of skin and subcutaneous tissue 02/19/2013   biopsy of skin over the sternum on the right breast showed hypertrophic scar,keloid formation.    PAST SURGICAL HISTORY :   Past Surgical History:  Procedure Laterality Date  . ABDOMINAL HYSTERECTOMY  2004   total  . BREAST BIOPSY Right January 26, 2014   Core biopsy for mild increase breast uptake on PET scan, usual ductal hyperplasia and stromal fibrosis  . BREAST SURGERY Left 2007   mastectomy  . chest wall mass  01/26/14  . CHOLECYSTECTOMY  2007  . COLONOSCOPY W/ POLYPECTOMY  2011   Dr. Brynda Greathouse  . EYE SURGERY Right    cataract surgery  . head surgery  1991  . MASTECTOMY Left 2007  . PORT-A-CATH REMOVAL  2008  . PORTACATH PLACEMENT  2007    FAMILY HISTORY :   Family History  Problem Relation Age of Onset  . Cancer Other        ovarian,breast,colon cancers;relations not listed  . Breast cancer Neg Hx     SOCIAL HISTORY:   Social History   Tobacco Use  . Smoking status: Current Some Day Smoker    Packs/day: 1.00    Years: 50.00    Pack years: 50.00    Types: Cigarettes  . Smokeless tobacco: Never Used  Substance Use Topics  . Alcohol use: No    Alcohol/week: 0.0 standard drinks  . Drug use: No    ALLERGIES:  is allergic to metoprolol.  MEDICATIONS:  Current Outpatient Medications  Medication Sig Dispense Refill  . abemaciclib (VERZENIO) 100 MG tablet Take 1 tablet (100 mg total) by mouth 2 (two) times daily. 60 tablet 6  . atorvastatin (LIPITOR) 20 MG tablet Take 20 mg by mouth daily at 6 PM.     . Calcium Carbonate-Vitamin D 600-200 MG-UNIT CAPS Take 200 capsules by mouth once.    . Cholecalciferol (VITAMIN D3) 1000 units CAPS Take by mouth.    . citalopram (CELEXA) 40 MG tablet Take 40 mg by mouth daily.    Marland Kitchen gabapentin (NEURONTIN) 300 MG capsule Take 300 mg by mouth daily.    . hydrochlorothiazide (MICROZIDE) 12.5 MG capsule  Take 1 capsule by mouth daily.    Marland Kitchen lisinopril (PRINIVIL,ZESTRIL) 2.5 MG tablet Take 2.5 mg by mouth daily.    . meloxicam (MOBIC) 15 MG tablet Take 15 mg by mouth daily.    . polyethylene glycol powder (GLYCOLAX/MIRALAX) powder 255 grams one bottle for colonoscopy prep 255 g 0  . tiZANidine (ZANAFLEX) 4 MG tablet TAKE 1 TABLET BY MOUTH THREE TIMES A DAY AS NEEDED (BACK PAIN/SPASMS).    . traMADol (ULTRAM) 50 MG tablet Take by mouth.     No current facility-administered medications for this visit.    Facility-Administered Medications Ordered in Other Visits  Medication Dose Route Frequency Provider Last Rate Last Dose  . fulvestrant (FASLODEX) injection 500 mg  500 mg Intramuscular Once [name], MD        PHYSICAL EXAMINATION: ECOG PERFORMANCE STATUS: 0 - Asymptomatic  BP (!) 145/78 (BP Location: Left Arm, Patient Position: Sitting, Cuff Size: Normal)   Pulse 80   Temp 98 F (36.7 C) (  Tympanic)   Resp 16   Wt 152 lb 12.8 oz (69.3 kg)   BMI 25.43 kg/m   Filed Weights   01/22/19 1319  Weight: 152 lb 12.8 oz (69.3 kg)    Physical Exam  Constitutional: She is oriented to person, place, and time and well-developed, well-nourished, and in no distress.  Accompanied by daughter.  She is walking by self.  HENT:  Head: Normocephalic and atraumatic.  Mouth/Throat: Oropharynx is clear and moist. No oropharyngeal exudate.  Eyes: Pupils are equal, round, and reactive to light.  Neck: Normal range of motion. Neck supple.  Cardiovascular: Normal rate and regular rhythm.  Pulmonary/Chest: No respiratory distress. She has no wheezes.  Abdominal: Soft. Bowel sounds are normal. She exhibits no distension and no mass. There is no abdominal tenderness. There is no rebound and no guarding.  Musculoskeletal: Normal range of motion.        General: No tenderness or edema.  Neurological: She is alert and oriented to person, place, and time.  Skin: Skin is warm.  Keloid noted in the  right inner lower quadrant of the breast.  No new lumps or bumps noted.  Psychiatric: Affect normal.     LABORATORY DATA:  I have reviewed the data as listed    Component Value Date/Time   NA 139 01/22/2019 1236   NA 139 05/30/2014 0127   K 3.9 01/22/2019 1236   K 3.7 05/30/2014 0127   CL 104 01/22/2019 1236   CL 106 05/30/2014 0127   CO2 26 01/22/2019 1236   CO2 25 05/30/2014 0127   GLUCOSE 158 (H) 01/22/2019 1236   GLUCOSE 136 (H) 05/30/2014 0127   BUN 25 (H) 01/22/2019 1236   BUN 19 (H) 05/30/2014 0127   CREATININE 1.58 (H) 01/22/2019 1236   CREATININE 0.98 05/30/2014 0127   CALCIUM 9.1 01/22/2019 1236   CALCIUM 9.0 05/30/2014 0127   PROT 7.7 01/22/2019 1236   PROT 7.8 05/08/2014 1131   ALBUMIN 3.9 01/22/2019 1236   ALBUMIN 3.7 05/08/2014 1131   AST 20 01/22/2019 1236   AST 20 05/08/2014 1131   ALT 12 01/22/2019 1236   ALT 18 05/08/2014 1131   ALKPHOS 55 01/22/2019 1236   ALKPHOS 58 05/08/2014 1131   BILITOT 0.5 01/22/2019 1236   BILITOT 0.4 05/08/2014 1131   GFRNONAA 31 (L) 01/22/2019 1236   GFRNONAA 58 (L) 05/30/2014 0127   GFRAA 36 (L) 01/22/2019 1236   GFRAA >60 05/30/2014 0127    No results found for: SPEP, UPEP  Lab Results  Component Value Date   WBC 5.2 01/22/2019   NEUTROABS 2.1 01/22/2019   HGB 11.0 (L) 01/22/2019   HCT 36.2 01/22/2019   MCV 91.6 01/22/2019   PLT 162 01/22/2019      Chemistry      Component Value Date/Time   NA 139 01/22/2019 1236   NA 139 05/30/2014 0127   K 3.9 01/22/2019 1236   K 3.7 05/30/2014 0127   CL 104 01/22/2019 1236   CL 106 05/30/2014 0127   CO2 26 01/22/2019 1236   CO2 25 05/30/2014 0127   BUN 25 (H) 01/22/2019 1236   BUN 19 (H) 05/30/2014 0127   CREATININE 1.58 (H) 01/22/2019 1236   CREATININE 0.98 05/30/2014 0127      Component Value Date/Time   CALCIUM 9.1 01/22/2019 1236   CALCIUM 9.0 05/30/2014 0127   ALKPHOS 55 01/22/2019 1236   ALKPHOS 58 05/08/2014 1131   AST 20 01/22/2019 1236   AST  20  05/08/2014 1131   ALT 12 01/22/2019 1236   ALT 18 05/08/2014 1131   BILITOT 0.5 01/22/2019 1236   BILITOT 0.4 05/08/2014 1131        RADIOGRAPHIC STUDIES: I have personally reviewed the radiological images as listed and agreed with the findings in the report. No results found.   ASSESSMENT & PLAN:   Malignant neoplasm of overlapping sites of left breast in female, estrogen receptor positive ([place]) #Left breast ipsilateral chest wall recurrence stage IV [2015];  Jan 2020-PET scan suggestive of mediastinal/hilar adenopathy; single right iliac bone lesion.  Concerning for progressive disease.  Currently on abemaciclib plus Faslodex.  #: Abemaciclib-multiple vague complaints [tremors fatigue poor appetite].  Continue Faslodex today.  # Tremors/fatigue/poor aptetite/ no weight loss- check TSH; stop abema today.  CT head; [MRI- ? Aneurym/staples].   #Osteopenia in the bone density test [ BMD- Jan 2017]; on ca+vit D;stable.   #Elevated creatinine 1.5 baseline 0.9-1.  Hold lisinopril hydrochlorothiazide. HOLD linispril/HCTZ.  call with log.   # DISPOSITION: # add TSH today.  # faslodex injection today.  # follow up with MD/labs- cbc/cmp/in 2 weeks-Dr.B     [name], MD 01/22/2019 2:21 PM

## 2019-01-29 ENCOUNTER — Ambulatory Visit
Admission: RE | Admit: 2019-01-29 | Discharge: 2019-01-29 | Disposition: A | Discharge status: Home / Self Care | Payer: Medicare HMO | Source: Ambulatory Visit | Attending: Internal Medicine | Admitting: Internal Medicine

## 2019-01-29 DIAGNOSIS — M85852 Other specified disorders of bone density and structure, left thigh: Secondary | ICD-10-CM | POA: Diagnosis not present

## 2019-01-29 DIAGNOSIS — Z79811 Long term (current) use of aromatase inhibitors: Secondary | ICD-10-CM | POA: Insufficient documentation

## 2019-02-04 ENCOUNTER — Encounter: Payer: Self-pay | Admitting: Internal Medicine

## 2019-02-04 ENCOUNTER — Inpatient Hospital Stay (HOSPITAL_BASED_OUTPATIENT_CLINIC_OR_DEPARTMENT_OTHER): Payer: Medicare HMO | Admitting: Internal Medicine

## 2019-02-04 ENCOUNTER — Other Ambulatory Visit: Payer: Self-pay

## 2019-02-04 ENCOUNTER — Inpatient Hospital Stay: Payer: Medicare HMO | Attending: Internal Medicine

## 2019-02-04 VITALS — BP 158/84 | HR 81 | Temp 97.6°F | Resp 20

## 2019-02-04 DIAGNOSIS — R251 Tremor, unspecified: Secondary | ICD-10-CM | POA: Insufficient documentation

## 2019-02-04 DIAGNOSIS — M8580 Other specified disorders of bone density and structure, unspecified site: Secondary | ICD-10-CM | POA: Diagnosis not present

## 2019-02-04 DIAGNOSIS — R5383 Other fatigue: Secondary | ICD-10-CM

## 2019-02-04 DIAGNOSIS — Z90710 Acquired absence of both cervix and uterus: Secondary | ICD-10-CM | POA: Diagnosis not present

## 2019-02-04 DIAGNOSIS — F17210 Nicotine dependence, cigarettes, uncomplicated: Secondary | ICD-10-CM

## 2019-02-04 DIAGNOSIS — C792 Secondary malignant neoplasm of skin: Principal | ICD-10-CM

## 2019-02-04 DIAGNOSIS — Z5111 Encounter for antineoplastic chemotherapy: Secondary | ICD-10-CM | POA: Diagnosis not present

## 2019-02-04 DIAGNOSIS — C50812 Malignant neoplasm of overlapping sites of left female breast: Secondary | ICD-10-CM

## 2019-02-04 DIAGNOSIS — R630 Anorexia: Secondary | ICD-10-CM | POA: Diagnosis not present

## 2019-02-04 DIAGNOSIS — Z170 Estrogen receptor positive status [ER+]: Secondary | ICD-10-CM

## 2019-02-04 DIAGNOSIS — Z79811 Long term (current) use of aromatase inhibitors: Secondary | ICD-10-CM

## 2019-02-04 DIAGNOSIS — N281 Cyst of kidney, acquired: Secondary | ICD-10-CM | POA: Insufficient documentation

## 2019-02-04 DIAGNOSIS — Z9012 Acquired absence of left breast and nipple: Secondary | ICD-10-CM | POA: Diagnosis not present

## 2019-02-04 DIAGNOSIS — E119 Type 2 diabetes mellitus without complications: Secondary | ICD-10-CM | POA: Diagnosis not present

## 2019-02-04 DIAGNOSIS — I10 Essential (primary) hypertension: Secondary | ICD-10-CM

## 2019-02-04 DIAGNOSIS — Z79899 Other long term (current) drug therapy: Secondary | ICD-10-CM | POA: Insufficient documentation

## 2019-02-04 DIAGNOSIS — C50912 Malignant neoplasm of unspecified site of left female breast: Secondary | ICD-10-CM

## 2019-02-04 LAB — COMPREHENSIVE METABOLIC PANEL
ALBUMIN: 3.8 g/dL (ref 3.5–5.0)
ALK PHOS: 66 U/L (ref 38–126)
ALT: 15 U/L (ref 0–44)
AST: 20 U/L (ref 15–41)
Anion gap: 6 (ref 5–15)
BUN: 17 mg/dL (ref 8–23)
CO2: 28 mmol/L (ref 22–32)
Calcium: 8.8 mg/dL — ABNORMAL LOW (ref 8.9–10.3)
Chloride: 105 mmol/L (ref 98–111)
Creatinine, Ser: 1.04 mg/dL — ABNORMAL HIGH (ref 0.44–1.00)
GFR calc Af Amer: >60 mL/min (ref >60)
GFR calc non Af Amer: 52 mL/min — ABNORMAL LOW (ref >60)
Glucose, Bld: 153 mg/dL — ABNORMAL HIGH (ref 70–99)
Potassium: 3.6 mmol/L (ref 3.5–5.1)
Sodium: 139 mmol/L (ref 135–145)
Total Bilirubin: 0.7 mg/dL (ref 0.3–1.2)
Total Protein: 7.5 g/dL (ref 6.5–8.1)

## 2019-02-04 LAB — CBC WITH DIFFERENTIAL/PLATELET
Abs Immature Granulocytes: 0.02 10*3/uL (ref 0.00–0.07)
BASOS ABS: 0 10*3/uL (ref 0.0–0.1)
Basophils Relative: 1 %
EOS ABS: 0.2 10*3/uL (ref 0.0–0.5)
EOS PCT: 4 %
HCT: 34.6 % — ABNORMAL LOW (ref 36.0–46.0)
Hemoglobin: 10.8 g/dL — ABNORMAL LOW (ref 12.0–15.0)
IMMATURE GRANULOCYTES: 0 %
Lymphocytes Relative: 42 %
Lymphs Abs: 2.5 10*3/uL (ref 0.7–4.0)
MCH: 27.9 pg (ref 26.0–34.0)
MCHC: 31.2 g/dL (ref 30.0–36.0)
MCV: 89.4 fL (ref 80.0–100.0)
Monocytes Absolute: 0.5 10*3/uL (ref 0.1–1.0)
Monocytes Relative: 9 %
Neutro Abs: 2.6 10*3/uL (ref 1.7–7.7)
Neutrophils Relative %: 44 %
Platelets: 235 10*3/uL (ref 150–400)
RBC: 3.87 MIL/uL (ref 3.87–5.11)
RDW: 14.5 % (ref 11.5–15.5)
WBC: 5.9 10*3/uL (ref 4.0–10.5)
nRBC: 0 % (ref 0.0–0.2)

## 2019-02-04 MED ORDER — AMLODIPINE BESYLATE 5 MG PO TABS: 5.0000 mg | ORAL_TABLET | Freq: Every day | ORAL | 2 refills | Status: DC

## 2019-02-04 NOTE — Assessment & Plan Note (Addendum)
#  Left breast ipsilateral chest wall recurrence stage IV [2015];  Jan 2020-PET scan suggestive of mediastinal/hilar adenopathy; single right iliac bone lesion.  Concerning for progressive disease.   #  Most recently on abemaciclib plus Faslodex; abema held sec to tremors/fatigue- currently resolved. Will plan to restart in 2 weeks at 100 mg/DAY ONLY.   # Tremors/fatigue/poor aptetite/ no weight loss-secondary to abemaciclib 100 twice daily.  Continue to hold at this time.  #Osteopenia in the bone density test [ BMD- feb 2020]; on ca+vit D; STABLE.   #HTN -poorly controlled; Creatinine at 0.9-1.  Re-start lisinopril; keep log of BPs.  Add norvasc 5 mg/d; stop HCTZ.   # DISPOSITION: # follow up with MD/labs- cbc/cmp/in 2 weeks/Faslodex/ca27-29-Dr.B

## 2019-02-04 NOTE — Progress Notes (Signed)
[place] OFFICE PROGRESS NOTE  Patient Care Team: [name], MD as PCP - General (Internal Medicine) Bary Castilla, Forest Gleason, MD (General Surgery) Marden Noble, MD (Internal Medicine)  Dr.Charles Mayer Masker; [place] HISTORY:  Oncology History   # 2007- LEFT BREAST CA STAGE II [T2N28m; s/p mastec; Dr.Byrnett] ER-Pos/PR Neg; Her- 2 Neu- NEG; ACx4-Taxol x12;Femara; stopped May 2009-stopped follow up.  #JAN 2015- Post mastectomy Chest wall recurrence [chest wall mass bx-]; ER- 90%; PR- NEG; Her2 Neu- NEG; s/p excision [involving skeletal muscle/adipose tissue; clear margins]; CT July 2017- NED;   # Jan 2020- progression/ hilar- START abema [jan 14th]+ faslodex[jan 13th]  # OSTEOPENIA [BMD- June 2016]  # Kidney cysts  DIAGNOSIS: LEFT BREAST CA  STAGE: IV- NED  ;GOALS: pallaitive  CURRENT/MOST RECENT THERAPY: faslodex+ Abema      Breast cancer [place]asized to skin ([facility] [place]   02/05/2014 Initial Diagnosis    [place]     Malignant neoplasm of overlapping sites of left breast in female, estrogen receptor positive ([facility]   07/24/2016 Initial Diagnosis    Cancer of overlapping sites of left female breast ([facility]    01/06/2019 -  Chemotherapy    The patient had fulvestrant (FASLODEX) injection 500 mg, 500 mg, Intramuscular,  Once, 0 of 4 cycles Dose modification: 500 mg (original dose 500 mg, Cycle 1)  for chemotherapy treatment.       INTERVAL HISTORY:  A very pleasant 78year old African-American female patient with above history of ER/PR positive breast cancer stage IV currently on abemaciclib plus Faslodex is here for follow-up.  Patient's abemaciclib was held 2 weeks ago because of poor tolerance-tremors gait instability.   Currently symptoms are resolved.  She feels back to baseline. No nausea no vomiting.  No diarrhea no rash.     Review of Systems  Constitutional: Positive for  malaise/fatigue. Negative for chills, diaphoresis, fever and weight loss.  HENT: Negative for nosebleeds and sore throat.   Eyes: Negative for double vision.  Respiratory: Negative for cough, hemoptysis, sputum production, shortness of breath and wheezing.   Cardiovascular: Negative for chest pain, palpitations, orthopnea and leg swelling.  Gastrointestinal: Negative for abdominal pain, blood in stool, constipation, diarrhea, heartburn, melena, nausea and vomiting.  Genitourinary: Negative for dysuria, frequency and urgency.  Musculoskeletal: Positive for back pain and joint pain.  Skin: Negative.  Negative for itching and rash.  Neurological: Negative for tingling, focal weakness, weakness and headaches.  Endo/Heme/Allergies: Does not bruise/bleed easily.  Psychiatric/Behavioral: Negative for depression. The patient is not nervous/anxious and does not have insomnia.      PAST MEDICAL HISTORY :  Past Medical History:  Diagnosis Date  . [place]  . Arthritis   . Cancer ([facility] 2007   diagnosed 01/07 left breast with simple mastectomu & sn bx. The pt had a 3.5cm tumor. Frozen section report on the 2 sn were negative. On permanent sections a 0.550mmicrometastasis was identified. She was not felt to require a complete axillary dissection. She has completed her Tamoxifen therapy and has been released from routine f/u with medical oncology service.  . Diabetes mellitus without complication ([facility]  . Hyperlipidemia   . Hypertension    since age 78. [facility]   Mastectomy site recurrence resected 01/26/2014, ER 90%, PR 0%, HER-2/neu nonamplified.  . Other benign neoplasm of connective and other soft tissue  of trunk, unspecified 2014  . Personal history of colonic polyps   . Personal history of tobacco use, presenting hazards to health   . Special screening for malignant neoplasms, colon   . Unspecified disorder of  skin and subcutaneous tissue 02/19/2013   biopsy of skin over the sternum on the right breast showed hypertrophic scar,keloid formation.    PAST SURGICAL HISTORY :   Past Surgical History:  Procedure Laterality Date  . ABDOMINAL HYSTERECTOMY  2004   total  . BREAST BIOPSY Right January 26, 2014   Core biopsy for mild increase breast uptake on PET scan, usual ductal hyperplasia and stromal fibrosis  . BREAST SURGERY Left 2007   mastectomy  . chest wall mass  01/26/14  . CHOLECYSTECTOMY  2007  . COLONOSCOPY W/ POLYPECTOMY  2011   Dr. Brynda Greathouse  . EYE SURGERY Right    cataract surgery  . head surgery  1991  . MASTECTOMY Left 2007  . PORT-A-CATH REMOVAL  2008  . PORTACATH PLACEMENT  2007    FAMILY HISTORY :   Family History  Problem Relation Age of Onset  . Cancer Other        ovarian,breast,colon cancers;relations not listed  . Breast cancer Neg Hx     SOCIAL HISTORY:   Social History   Tobacco Use  . Smoking status: Current Some Day Smoker    Packs/day: 1.00    Years: 50.00    Pack years: 50.00    Types: Cigarettes  . Smokeless tobacco: Never Used  Substance Use Topics  . Alcohol use: No    Alcohol/week: 0.0 standard drinks  . Drug use: No    ALLERGIES:  is allergic to metoprolol.  MEDICATIONS:  Current Outpatient Medications  Medication Sig Dispense Refill  . atorvastatin (LIPITOR) 20 MG tablet Take 20 mg by mouth daily at 6 PM.     . Calcium Carbonate-Vitamin D 600-200 MG-UNIT CAPS Take 200 capsules by mouth once.    . Cholecalciferol (VITAMIN D3) 1000 units CAPS Take by mouth.    . citalopram (CELEXA) 40 MG tablet Take 40 mg by mouth daily.    Marland Kitchen abemaciclib (VERZENIO) 100 MG tablet Take 1 tablet (100 mg total) by mouth 2 (two) times daily. (Patient not taking: Reported on 02/04/2019) 60 tablet 6  . amLODipine (NORVASC) 5 MG tablet Take 1 tablet (5 mg total) by mouth daily. 30 tablet 2   No current facility-administered medications for this visit.     PHYSICAL  EXAMINATION: ECOG PERFORMANCE STATUS: 0 - Asymptomatic  BP (!) 158/84 (Patient Position: Sitting)   Pulse 81   Temp 97.6 F (36.4 C) (Tympanic)   Resp 20   There were no vitals filed for this visit.  Physical Exam  Constitutional: She is oriented to person, place, and time and well-developed, well-nourished, and in no distress.  Accompanied by daughter.  She is walking by self.  HENT:  Head: Normocephalic and atraumatic.  Mouth/Throat: Oropharynx is clear and moist. No oropharyngeal exudate.  Eyes: Pupils are equal, round, and reactive to light.  Neck: Normal range of motion. Neck supple.  Cardiovascular: Normal rate and regular rhythm.  Pulmonary/Chest: No respiratory distress. She has no wheezes.  Abdominal: Soft. Bowel sounds are normal. She exhibits no distension and no mass. There is no abdominal tenderness. There is no rebound and no guarding.  Musculoskeletal: Normal range of motion.        General: No tenderness or edema.  Neurological: She is alert and oriented  to person, place, and time.  Skin: Skin is warm.  Keloid noted in the right inner lower quadrant of the breast.  No new lumps or bumps noted.  Psychiatric: Affect normal.     LABORATORY DATA:  I have reviewed the data as listed    Component Value Date/Time   NA 139 02/04/2019 1322   NA 139 05/30/2014 0127   K 3.6 02/04/2019 1322   K 3.7 05/30/2014 0127   CL 105 02/04/2019 1322   CL 106 05/30/2014 0127   CO2 28 02/04/2019 1322   CO2 25 05/30/2014 0127   GLUCOSE 153 (H) 02/04/2019 1322   GLUCOSE 136 (H) 05/30/2014 0127   BUN 17 02/04/2019 1322   BUN 19 (H) 05/30/2014 0127   CREATININE 1.04 (H) 02/04/2019 1322   CREATININE 0.98 05/30/2014 0127   CALCIUM 8.8 (L) 02/04/2019 1322   CALCIUM 9.0 05/30/2014 0127   PROT 7.5 02/04/2019 1322   PROT 7.8 05/08/2014 1131   ALBUMIN 3.8 02/04/2019 1322   ALBUMIN 3.7 05/08/2014 1131   AST 20 02/04/2019 1322   AST 20 05/08/2014 1131   ALT 15 02/04/2019 1322    ALT 18 05/08/2014 1131   ALKPHOS 66 02/04/2019 1322   ALKPHOS 58 05/08/2014 1131   BILITOT 0.7 02/04/2019 1322   BILITOT 0.4 05/08/2014 1131   GFRNONAA 52 (L) 02/04/2019 1322   GFRNONAA 58 (L) 05/30/2014 0127   GFRAA >60 02/04/2019 1322   GFRAA >60 05/30/2014 0127    No results found for: SPEP, UPEP  Lab Results  Component Value Date   WBC 5.9 02/04/2019   NEUTROABS 2.6 02/04/2019   HGB 10.8 (L) 02/04/2019   HCT 34.6 (L) 02/04/2019   MCV 89.4 02/04/2019   PLT 235 02/04/2019      Chemistry      Component Value Date/Time   NA 139 02/04/2019 1322   NA 139 05/30/2014 0127   K 3.6 02/04/2019 1322   K 3.7 05/30/2014 0127   CL 105 02/04/2019 1322   CL 106 05/30/2014 0127   CO2 28 02/04/2019 1322   CO2 25 05/30/2014 0127   BUN 17 02/04/2019 1322   BUN 19 (H) 05/30/2014 0127   CREATININE 1.04 (H) 02/04/2019 1322   CREATININE 0.98 05/30/2014 0127      Component Value Date/Time   CALCIUM 8.8 (L) 02/04/2019 1322   CALCIUM 9.0 05/30/2014 0127   ALKPHOS 66 02/04/2019 1322   ALKPHOS 58 05/08/2014 1131   AST 20 02/04/2019 1322   AST 20 05/08/2014 1131   ALT 15 02/04/2019 1322   ALT 18 05/08/2014 1131   BILITOT 0.7 02/04/2019 1322   BILITOT 0.4 05/08/2014 1131        RADIOGRAPHIC STUDIES: I have personally reviewed the radiological images as listed and agreed with the findings in the report. No results found.   ASSESSMENT & PLAN:   Malignant neoplasm of overlapping sites of left breast in female, estrogen receptor positive ([place]) #Left breast ipsilateral chest wall recurrence stage IV [2015];  Jan 2020-PET scan suggestive of mediastinal/hilar adenopathy; single right iliac bone lesion.  Concerning for progressive disease.   #  Most recently on abemaciclib plus Faslodex; abema held sec to tremors/fatigue- currently resolved. Will plan to restart in 2 weeks at 100 mg/DAY ONLY.   # Tremors/fatigue/poor aptetite/ no weight loss-secondary to abemaciclib 100 twice daily.   Continue to hold at this time.  #Osteopenia in the bone density test [ BMD- feb 2020]; on ca+vit D; STABLE.   #  HTN -poorly controlled; Creatinine at 0.9-1.  Re-start lisinopril; keep log of BPs.  Add norvasc 5 mg/d; stop HCTZ.   # DISPOSITION: # follow up with MD/labs- cbc/cmp/in 2 weeks/Faslodex/ca27-29-Dr.B     [name], MD 02/04/2019 2:29 PM

## 2019-02-04 NOTE — Patient Instructions (Signed)
#   DISCONTINUE HCTZ # re-start lisinopril # continue to HOLD abemacliclib [breast cancer pill]

## 2019-02-05 ENCOUNTER — Other Ambulatory Visit: Payer: Self-pay | Admitting: *Deleted

## 2019-02-05 LAB — CANCER ANTIGEN 27.29: CA 27.29: 69.2 U/mL — ABNORMAL HIGH (ref 0.0–38.6)

## 2019-02-05 MED ORDER — LISINOPRIL 2.5 MG PO TABS: 2.5000 mg | ORAL_TABLET | Freq: Every day | ORAL | 3 refills | Status: DC

## 2019-02-05 NOTE — Telephone Encounter (Signed)
Jessica Compton, per Dr. Sharmaine Base note:  Re-start lisinopril; keep log of BPs.  Add norvasc 5 mg/d; stop HCTZ. Can you find out if they don't have lisinopril?

## 2019-02-05 NOTE — Telephone Encounter (Signed)
Patient was expecting a new BP medicine to be sent in and all she got was a refill of what she was already taking Amlodipine. Please return her call [phone]

## 2019-02-18 ENCOUNTER — Ambulatory Visit: Payer: Medicare HMO | Admitting: Internal Medicine

## 2019-02-18 ENCOUNTER — Ambulatory Visit: Payer: Medicare HMO

## 2019-02-18 ENCOUNTER — Other Ambulatory Visit: Payer: Medicare HMO

## 2019-02-19 ENCOUNTER — Telehealth: Payer: Self-pay | Admitting: Internal Medicine

## 2019-02-19 ENCOUNTER — Inpatient Hospital Stay: Payer: Medicare HMO

## 2019-02-19 ENCOUNTER — Inpatient Hospital Stay (HOSPITAL_BASED_OUTPATIENT_CLINIC_OR_DEPARTMENT_OTHER): Payer: Medicare HMO | Admitting: Internal Medicine

## 2019-02-19 ENCOUNTER — Other Ambulatory Visit: Payer: Self-pay

## 2019-02-19 ENCOUNTER — Encounter: Payer: Self-pay | Admitting: Internal Medicine

## 2019-02-19 VITALS — BP 145/78 | HR 81 | Temp 98.7°F | Resp 18 | Ht 66.0 in | Wt 152.0 lb

## 2019-02-19 DIAGNOSIS — C50812 Malignant neoplasm of overlapping sites of left female breast: Secondary | ICD-10-CM

## 2019-02-19 DIAGNOSIS — I10 Essential (primary) hypertension: Secondary | ICD-10-CM | POA: Diagnosis not present

## 2019-02-19 DIAGNOSIS — N281 Cyst of kidney, acquired: Secondary | ICD-10-CM

## 2019-02-19 DIAGNOSIS — M8580 Other specified disorders of bone density and structure, unspecified site: Secondary | ICD-10-CM

## 2019-02-19 DIAGNOSIS — R300 Dysuria: Secondary | ICD-10-CM

## 2019-02-19 DIAGNOSIS — R251 Tremor, unspecified: Secondary | ICD-10-CM | POA: Diagnosis not present

## 2019-02-19 DIAGNOSIS — Z90710 Acquired absence of both cervix and uterus: Secondary | ICD-10-CM

## 2019-02-19 DIAGNOSIS — E119 Type 2 diabetes mellitus without complications: Secondary | ICD-10-CM

## 2019-02-19 DIAGNOSIS — Z170 Estrogen receptor positive status [ER+]: Principal | ICD-10-CM

## 2019-02-19 DIAGNOSIS — Z9012 Acquired absence of left breast and nipple: Secondary | ICD-10-CM

## 2019-02-19 DIAGNOSIS — F17210 Nicotine dependence, cigarettes, uncomplicated: Secondary | ICD-10-CM | POA: Diagnosis not present

## 2019-02-19 DIAGNOSIS — Z5111 Encounter for antineoplastic chemotherapy: Secondary | ICD-10-CM | POA: Diagnosis not present

## 2019-02-19 DIAGNOSIS — Z79899 Other long term (current) drug therapy: Secondary | ICD-10-CM | POA: Diagnosis not present

## 2019-02-19 LAB — COMPREHENSIVE METABOLIC PANEL
ALT: 12 U/L (ref 0–44)
AST: 20 U/L (ref 15–41)
Albumin: 4.1 g/dL (ref 3.5–5.0)
Alkaline Phosphatase: 73 U/L (ref 38–126)
Anion gap: 9 (ref 5–15)
BUN: 15 mg/dL (ref 8–23)
CO2: 28 mmol/L (ref 22–32)
Calcium: 9.2 mg/dL (ref 8.9–10.3)
Chloride: 104 mmol/L (ref 98–111)
Creatinine, Ser: 1.04 mg/dL — ABNORMAL HIGH (ref 0.44–1.00)
GFR calc Af Amer: >60 mL/min (ref >60)
GFR calc non Af Amer: 52 mL/min — ABNORMAL LOW (ref >60)
Glucose, Bld: 130 mg/dL — ABNORMAL HIGH (ref 70–99)
Potassium: 3.9 mmol/L (ref 3.5–5.1)
Sodium: 141 mmol/L (ref 135–145)
Total Bilirubin: 0.7 mg/dL (ref 0.3–1.2)
Total Protein: 8 g/dL (ref 6.5–8.1)

## 2019-02-19 LAB — URINALYSIS, COMPLETE (UACMP) WITH MICROSCOPIC
Bacteria, UA: NONE SEEN
Bilirubin Urine: NEGATIVE
Glucose, UA: NEGATIVE mg/dL
Hgb urine dipstick: NEGATIVE
Ketones, ur: NEGATIVE mg/dL
Nitrite: NEGATIVE
Protein, ur: NEGATIVE mg/dL
SPECIFIC GRAVITY, URINE: 1.018 (ref 1.005–1.030)
WBC, UA: >50 WBC/hpf — ABNORMAL HIGH (ref 0–5)
pH: 5 (ref 5.0–8.0)

## 2019-02-19 LAB — CBC WITH DIFFERENTIAL/PLATELET
Abs Immature Granulocytes: 0.01 10*3/uL (ref 0.00–0.07)
Basophils Absolute: 0 10*3/uL (ref 0.0–0.1)
Basophils Relative: 0 %
EOS ABS: 0.4 10*3/uL (ref 0.0–0.5)
Eosinophils Relative: 6 %
HCT: 35 % — ABNORMAL LOW (ref 36.0–46.0)
Hemoglobin: 10.8 g/dL — ABNORMAL LOW (ref 12.0–15.0)
Immature Granulocytes: 0 %
Lymphocytes Relative: 34 %
Lymphs Abs: 2.7 10*3/uL (ref 0.7–4.0)
MCH: 27.8 pg (ref 26.0–34.0)
MCHC: 30.9 g/dL (ref 30.0–36.0)
MCV: 90.2 fL (ref 80.0–100.0)
MONO ABS: 0.7 10*3/uL (ref 0.1–1.0)
Monocytes Relative: 9 %
Neutro Abs: 4 10*3/uL (ref 1.7–7.7)
Neutrophils Relative %: 51 %
Platelets: 290 10*3/uL (ref 150–400)
RBC: 3.88 MIL/uL (ref 3.87–5.11)
RDW: 14.4 % (ref 11.5–15.5)
WBC: 7.8 10*3/uL (ref 4.0–10.5)
nRBC: 0 % (ref 0.0–0.2)

## 2019-02-19 MED ORDER — SULFAMETHOXAZOLE-TRIMETHOPRIM 800-160 MG PO TABS: 1.0000 | ORAL_TABLET | Freq: Two times a day (BID) | ORAL | 0 refills | Status: DC

## 2019-02-19 MED ORDER — CITALOPRAM HYDROBROMIDE 40 MG PO TABS: 40.0000 mg | ORAL_TABLET | Freq: Every day | ORAL | 0 refills | Status: DC

## 2019-02-19 MED ORDER — FULVESTRANT 250 MG/5ML IM SOLN
500.0000 mg | Freq: Once | INTRAMUSCULAR | Status: AC
  Administered 2019-02-19: 500 mg via INTRAMUSCULAR
  Filled 2019-02-19: qty 10

## 2019-02-19 NOTE — Telephone Encounter (Signed)
Spoke with Daughter Helene Kelp. Explained to daughter that bactrim script was sent in to pharmacy. Pending final cultures. Instructed to hold the Abemaciclib for 1 week. Teach back process performed with patient's daughter.

## 2019-02-19 NOTE — Progress Notes (Signed)
[place] OFFICE PROGRESS NOTE  Patient Care Team: [name], MD as PCP - General (Internal Medicine) Bary Castilla, Forest Gleason, MD (General Surgery) Marden Noble, MD (Internal Medicine)  Dr.Charles Mayer Masker; [place] HISTORY:  Oncology History   # 2007- LEFT BREAST CA STAGE II [T2N15m; s/p mastec; Dr.Byrnett] ER-Pos/PR Neg; Her- 2 Neu- NEG; ACx4-Taxol x12;Femara; stopped May 2009-stopped follow up.  #JAN 2015- Post mastectomy Chest wall recurrence [chest wall mass bx-]; ER- 90%; PR- NEG; Her2 Neu- NEG; s/p excision [involving skeletal muscle/adipose tissue; clear margins]; CT July 2017- NED;   # Jan 2020- progression/ hilar- START abema [jan 14th]+ faslodex[jan 13th]  # OSTEOPENIA [BMD- June 2016]  # Kidney cysts  DIAGNOSIS: LEFT BREAST CA  STAGE: IV- NED  ;GOALS: pallaitive  CURRENT/MOST RECENT THERAPY: faslodex+ Abema      Breast cancer [place]asized to skin ([facility]   02/05/2014 Initial Diagnosis    Breast cancer metastasized to skin ([facility]     Malignant neoplasm of overlapping sites of left breast in female, estrogen receptor positive [place]   07/24/2016 Initial Diagnosis    Cancer of overlapping sites of left female breast ([facility]    01/06/2019 -  Chemotherapy    The patient had fulvestrant (FASLODEX) injection 500 mg, 500 mg, Intramuscular,  Once, 0 of 4 cycles Dose modification: 500 mg (original dose 500 mg, Cycle 1)  for chemotherapy treatment.       INTERVAL HISTORY:  A very pleasant 78year old African-American female patient with above history of ER/PR positive breast cancer stage IV currently on Faslodex is here for follow-up.  Patient's abemaciclib was held 4 weeks ago because of poor tolerance-tremors gait instability.  Patient symptoms are resolved.  Denies any gait instability or tremors.  She complains of difficulty with urination increased frequency and burning pain for the last 3 days.  No fever chills.   No back pain.   Review of Systems  Constitutional: Positive for malaise/fatigue. Negative for chills, diaphoresis, fever and weight loss.  HENT: Negative for nosebleeds and sore throat.   Eyes: Negative for double vision.  Respiratory: Negative for cough, hemoptysis, sputum production, shortness of breath and wheezing.   Cardiovascular: Negative for chest pain, palpitations, orthopnea and leg swelling.  Gastrointestinal: Negative for abdominal pain, blood in stool, constipation, diarrhea, heartburn, melena, nausea and vomiting.  Genitourinary: Positive for dysuria, frequency and urgency.  Musculoskeletal: Positive for back pain and joint pain.  Skin: Negative.  Negative for itching and rash.  Neurological: Negative for tingling, focal weakness, weakness and headaches.  Endo/Heme/Allergies: Does not bruise/bleed easily.  Psychiatric/Behavioral: Negative for depression. The patient is not nervous/anxious and does not have insomnia.      PAST MEDICAL HISTORY :  Past Medical History:  Diagnosis Date  . Aneurysm ([facility] 2007  . Arthritis   . Cancer ([facility] 2007   diagnosed 01/07 left breast with simple mastectomu & sn bx. The pt had a 3.5cm tumor. Frozen section report on the 2 sn were negative. On permanent sections a 0.565mmicrometastasis was identified. She was not felt to require a complete axillary dissection. She has completed her Tamoxifen therapy and has been released from routine f/u with medical oncology service.  . Diabetes mellitus without complication ([facility]  . Hyperlipidemia   . Hypertension    since age 78. [facility]   Mastectomy site recurrence resected 01/26/2014, ER 90%, PR 0%, HER-2/neu  nonamplified.  . Other benign neoplasm of connective and other soft tissue of trunk, unspecified 2014  . Personal history of colonic polyps   . Personal history of tobacco use, presenting hazards to health   . Special  screening for malignant neoplasms, colon   . Unspecified disorder of skin and subcutaneous tissue 02/19/2013   biopsy of skin over the sternum on the right breast showed hypertrophic scar,keloid formation.    PAST SURGICAL HISTORY :   Past Surgical History:  Procedure Laterality Date  . ABDOMINAL HYSTERECTOMY  2004   total  . BREAST BIOPSY Right January 26, 2014   Core biopsy for mild increase breast uptake on PET scan, usual ductal hyperplasia and stromal fibrosis  . BREAST SURGERY Left 2007   mastectomy  . chest wall mass  01/26/14  . CHOLECYSTECTOMY  2007  . COLONOSCOPY W/ POLYPECTOMY  2011   Dr. Brynda Greathouse  . EYE SURGERY Right    cataract surgery  . head surgery  1991  . MASTECTOMY Left 2007  . PORT-A-CATH REMOVAL  2008  . PORTACATH PLACEMENT  2007    FAMILY HISTORY :   Family History  Problem Relation Age of Onset  . Cancer Other        ovarian,breast,colon cancers;relations not listed  . Breast cancer Neg Hx     SOCIAL HISTORY:   Social History   Tobacco Use  . Smoking status: Current Some Day Smoker    Packs/day: 1.00    Years: 50.00    Pack years: 50.00    Types: Cigarettes  . Smokeless tobacco: Never Used  Substance Use Topics  . Alcohol use: No    Alcohol/week: 0.0 standard drinks  . Drug use: No    ALLERGIES:  is allergic to metoprolol.  MEDICATIONS:  Current Outpatient Medications  Medication Sig Dispense Refill  . amLODipine (NORVASC) 5 MG tablet Take 1 tablet (5 mg total) by mouth daily. 30 tablet 2  . atorvastatin (LIPITOR) 20 MG tablet Take 20 mg by mouth daily at 6 PM.     . Calcium Carbonate-Vitamin D 600-200 MG-UNIT CAPS Take 200 capsules by mouth once.    . citalopram (CELEXA) 40 MG tablet Take 1 tablet (40 mg total) by mouth daily. 30 tablet 0  . lisinopril (PRINIVIL,ZESTRIL) 2.5 MG tablet Take 1 tablet (2.5 mg total) by mouth daily. 30 tablet 3   No current facility-administered medications for this visit.     PHYSICAL EXAMINATION: ECOG  PERFORMANCE STATUS: 0 - Asymptomatic  BP (!) 145/78 (BP Location: Right Arm, Patient Position: Sitting)   Pulse 81   Temp 98.7 F (37.1 C) (Tympanic)   Resp 18   Ht 5' 6"  (1.676 m)   Wt 152 lb (68.9 kg)   BMI 24.53 kg/m   Filed Weights   02/19/19 0952  Weight: 152 lb (68.9 kg)    Physical Exam  Constitutional: She is oriented to person, place, and time and well-developed, well-nourished, and in no distress.  Accompanied by daughter.  She is walking by self.  HENT:  Head: Normocephalic and atraumatic.  Mouth/Throat: Oropharynx is clear and moist. No oropharyngeal exudate.  Eyes: Pupils are equal, round, and reactive to light.  Neck: Normal range of motion. Neck supple.  Cardiovascular: Normal rate and regular rhythm.  Pulmonary/Chest: No respiratory distress. She has no wheezes.  Abdominal: Soft. Bowel sounds are normal. She exhibits no distension and no mass. There is no abdominal tenderness. There is no rebound and no guarding.  Musculoskeletal:  Normal range of motion.        General: No tenderness or edema.  Neurological: She is alert and oriented to person, place, and time.  Skin: Skin is warm.  Keloid noted in the right inner lower quadrant of the breast.  No new lumps or bumps noted.  Psychiatric: Affect normal.     LABORATORY DATA:  I have reviewed the data as listed    Component Value Date/Time   NA 141 02/19/2019 0928   NA 139 05/30/2014 0127   K 3.9 02/19/2019 0928   K 3.7 05/30/2014 0127   CL 104 02/19/2019 0928   CL 106 05/30/2014 0127   CO2 28 02/19/2019 0928   CO2 25 05/30/2014 0127   GLUCOSE 130 (H) 02/19/2019 0928   GLUCOSE 136 (H) 05/30/2014 0127   BUN 15 02/19/2019 0928   BUN 19 (H) 05/30/2014 0127   CREATININE 1.04 (H) 02/19/2019 0928   CREATININE 0.98 05/30/2014 0127   CALCIUM 9.2 02/19/2019 0928   CALCIUM 9.0 05/30/2014 0127   PROT 8.0 02/19/2019 0928   PROT 7.8 05/08/2014 1131   ALBUMIN 4.1 02/19/2019 0928   ALBUMIN 3.7 05/08/2014 1131    AST 20 02/19/2019 0928   AST 20 05/08/2014 1131   ALT 12 02/19/2019 0928   ALT 18 05/08/2014 1131   ALKPHOS 73 02/19/2019 0928   ALKPHOS 58 05/08/2014 1131   BILITOT 0.7 02/19/2019 0928   BILITOT 0.4 05/08/2014 1131   GFRNONAA 52 (L) 02/19/2019 0928   GFRNONAA 58 (L) 05/30/2014 0127   GFRAA >60 02/19/2019 0928   GFRAA >60 05/30/2014 0127    No results found for: SPEP, UPEP  Lab Results  Component Value Date   WBC 7.8 02/19/2019   NEUTROABS 4.0 02/19/2019   HGB 10.8 (L) 02/19/2019   HCT 35.0 (L) 02/19/2019   MCV 90.2 02/19/2019   PLT 290 02/19/2019      Chemistry      Component Value Date/Time   NA 141 02/19/2019 0928   NA 139 05/30/2014 0127   K 3.9 02/19/2019 0928   K 3.7 05/30/2014 0127   CL 104 02/19/2019 0928   CL 106 05/30/2014 0127   CO2 28 02/19/2019 0928   CO2 25 05/30/2014 0127   BUN 15 02/19/2019 0928   BUN 19 (H) 05/30/2014 0127   CREATININE 1.04 (H) 02/19/2019 0928   CREATININE 0.98 05/30/2014 0127      Component Value Date/Time   CALCIUM 9.2 02/19/2019 0928   CALCIUM 9.0 05/30/2014 0127   ALKPHOS 73 02/19/2019 0928   ALKPHOS 58 05/08/2014 1131   AST 20 02/19/2019 0928   AST 20 05/08/2014 1131   ALT 12 02/19/2019 0928   ALT 18 05/08/2014 1131   BILITOT 0.7 02/19/2019 0928   BILITOT 0.4 05/08/2014 1131        RADIOGRAPHIC STUDIES: I have personally reviewed the radiological images as listed and agreed with the findings in the report. No results found.   ASSESSMENT & PLAN:   Malignant neoplasm of overlapping sites of left breast in female, estrogen receptor positive ([place]) #Left breast ipsilateral chest wall recurrence stage IV [2015];  Jan 2020-PET scan suggestive of mediastinal/hilar adenopathy; single right iliac bone lesion.  Concerning for progressive disease. STABLE.   #Proceed with Faslodex today.  We will plan to restart abemaciclib 100 mg a day given the recent intolerance.  Patient agreeable.  # Tremors/fatigue/poor aptetite/  no weight loss-secondary to abemaciclib 100 twice daily. Resolved off abema. Plan as above.   #  Osteopenia in the bone density test [ BMD- feb 2020]; on ca+vit D; STABLE.  Single bone lesion/consider Zometa  # HTN -Creatinine at 0.9-1. STABLE;  continue lisinopril/ norvasc 5 mg/d.  # Dysuria x 3 days-check UA urine culture.  # Depression-stable.  Refilled citalopram/further refills with PCP.  # DISPOSITION:check UA/culture. # Faslodex today.   # follow up with MD/labs- cbc/cmp/in 2 weeks/ca27-29-Dr.B     [name], MD 02/19/2019 10:46 AM

## 2019-02-19 NOTE — Patient Instructions (Signed)
#   start abemcaciclib 100 mg/day

## 2019-02-19 NOTE — Assessment & Plan Note (Addendum)
#  Left breast ipsilateral chest wall recurrence stage IV [2015];  Jan 2020-PET scan suggestive of mediastinal/hilar adenopathy; single right iliac bone lesion.  Concerning for progressive disease. STABLE.   #Proceed with Faslodex today.  We will plan to restart abemaciclib 100 mg a day given the recent intolerance.  Patient agreeable.  # Tremors/fatigue/poor aptetite/ no weight loss-secondary to abemaciclib 100 twice daily. Resolved off abema. Plan as above.   #Osteopenia in the bone density test [ BMD- feb 2020]; on ca+vit D; STABLE.  Single bone lesion/consider Zometa  # HTN -Creatinine at 0.9-1. STABLE;  continue lisinopril/ norvasc 5 mg/d.  # Dysuria x 3 days-check UA urine culture.  # Depression-stable.  Refilled citalopram/further refills with PCP.  # DISPOSITION:check UA/culture. # Faslodex today.   # follow up with MD/labs- cbc/cmp/in 2 weeks/ca27-29-Dr.B  Addendum: Please inform patient's daughter that urine analysis suggestive of infection; I would recommend Bactrim for 3 days/I sent the prescription to pharmacy.  Will wait on final cultures to come back. #Recommend holding off starting Abemaciclib for 1 week; because of UTI. Start abema in 1 week from now.

## 2019-02-19 NOTE — Telephone Encounter (Signed)
Heather/ brooke-   Please inform patient's daughter that urine analysis suggestive of infection; I would recommend Bactrim for 3 days/I sent the prescription to pharmacy.  Will wait on final cultures to come back.  #Recommend holding off starting Abemaciclib for 1 week; because of UTI. Start abema in 1 week from now.   Thx GB

## 2019-02-19 NOTE — Telephone Encounter (Signed)
x

## 2019-02-20 LAB — CANCER ANTIGEN 27.29: CA 27.29: 68.7 U/mL — ABNORMAL HIGH (ref 0.0–38.6)

## 2019-02-21 ENCOUNTER — Telehealth: Payer: Self-pay | Admitting: *Deleted

## 2019-02-21 LAB — URINE CULTURE: Culture: 30000 — AB

## 2019-02-21 NOTE — Telephone Encounter (Signed)
Spoke with Teresa-pt's daughter. resassured daughter that the current antibiotics will cover this uti (ecoli bacterial infection). Daughter gave Verbal understanding for patient not to start Abemacilib until after 3/11. md will reevaluate pt at next apt.

## 2019-02-21 NOTE — Telephone Encounter (Signed)
-----   Message from [name], MD sent at 02/21/2019  8:43 AM EST ----- Shauntee Karp/Brooke-please inform patient's daughter that she her current antibiotic/ciprofloxacin to treat the infection.   Ask daughter- NOT to start Abemaciclib/verzinio until the next visit with me on 3/11. Will give further directions at that visit.

## 2019-03-04 ENCOUNTER — Other Ambulatory Visit: Payer: Self-pay

## 2019-03-04 DIAGNOSIS — Z170 Estrogen receptor positive status [ER+]: Principal | ICD-10-CM

## 2019-03-04 DIAGNOSIS — C50812 Malignant neoplasm of overlapping sites of left female breast: Secondary | ICD-10-CM

## 2019-03-05 ENCOUNTER — Inpatient Hospital Stay (HOSPITAL_BASED_OUTPATIENT_CLINIC_OR_DEPARTMENT_OTHER): Payer: Medicare HMO | Admitting: Internal Medicine

## 2019-03-05 ENCOUNTER — Inpatient Hospital Stay: Payer: Medicare HMO | Attending: Internal Medicine

## 2019-03-05 ENCOUNTER — Encounter: Payer: Self-pay | Admitting: Internal Medicine

## 2019-03-05 ENCOUNTER — Other Ambulatory Visit: Payer: Self-pay

## 2019-03-05 VITALS — BP 153/72 | HR 79 | Temp 97.6°F | Resp 20 | Ht 66.0 in | Wt 155.0 lb

## 2019-03-05 DIAGNOSIS — F17210 Nicotine dependence, cigarettes, uncomplicated: Secondary | ICD-10-CM | POA: Insufficient documentation

## 2019-03-05 DIAGNOSIS — I10 Essential (primary) hypertension: Secondary | ICD-10-CM

## 2019-03-05 DIAGNOSIS — Z9013 Acquired absence of bilateral breasts and nipples: Secondary | ICD-10-CM | POA: Diagnosis not present

## 2019-03-05 DIAGNOSIS — Z800 Family history of malignant neoplasm of digestive organs: Secondary | ICD-10-CM | POA: Insufficient documentation

## 2019-03-05 DIAGNOSIS — R300 Dysuria: Secondary | ICD-10-CM

## 2019-03-05 DIAGNOSIS — Z170 Estrogen receptor positive status [ER+]: Secondary | ICD-10-CM

## 2019-03-05 DIAGNOSIS — Z79899 Other long term (current) drug therapy: Secondary | ICD-10-CM | POA: Diagnosis not present

## 2019-03-05 DIAGNOSIS — M8580 Other specified disorders of bone density and structure, unspecified site: Secondary | ICD-10-CM

## 2019-03-05 DIAGNOSIS — Z8041 Family history of malignant neoplasm of ovary: Secondary | ICD-10-CM | POA: Diagnosis not present

## 2019-03-05 DIAGNOSIS — C50812 Malignant neoplasm of overlapping sites of left female breast: Secondary | ICD-10-CM

## 2019-03-05 DIAGNOSIS — F329 Major depressive disorder, single episode, unspecified: Secondary | ICD-10-CM | POA: Diagnosis not present

## 2019-03-05 LAB — URINALYSIS, COMPLETE (UACMP) WITH MICROSCOPIC
Bilirubin Urine: NEGATIVE
Glucose, UA: NEGATIVE mg/dL
Hgb urine dipstick: NEGATIVE
Ketones, ur: NEGATIVE mg/dL
Nitrite: NEGATIVE
Protein, ur: NEGATIVE mg/dL
Specific Gravity, Urine: 1.015 (ref 1.005–1.030)
pH: 6 (ref 5.0–8.0)

## 2019-03-05 LAB — CBC WITH DIFFERENTIAL/PLATELET
Abs Immature Granulocytes: 0.02 10*3/uL (ref 0.00–0.07)
Basophils Absolute: 0 10*3/uL (ref 0.0–0.1)
Basophils Relative: 0 %
EOS PCT: 6 %
Eosinophils Absolute: 0.4 10*3/uL (ref 0.0–0.5)
HCT: 33 % — ABNORMAL LOW (ref 36.0–46.0)
Hemoglobin: 10.2 g/dL — ABNORMAL LOW (ref 12.0–15.0)
Immature Granulocytes: 0 %
Lymphocytes Relative: 32 %
Lymphs Abs: 2.2 10*3/uL (ref 0.7–4.0)
MCH: 28.3 pg (ref 26.0–34.0)
MCHC: 30.9 g/dL (ref 30.0–36.0)
MCV: 91.7 fL (ref 80.0–100.0)
Monocytes Absolute: 0.7 10*3/uL (ref 0.1–1.0)
Monocytes Relative: 10 %
Neutro Abs: 3.5 10*3/uL (ref 1.7–7.7)
Neutrophils Relative %: 52 %
Platelets: 222 10*3/uL (ref 150–400)
RBC: 3.6 MIL/uL — ABNORMAL LOW (ref 3.87–5.11)
RDW: 14.9 % (ref 11.5–15.5)
WBC: 6.8 10*3/uL (ref 4.0–10.5)
nRBC: 0 % (ref 0.0–0.2)

## 2019-03-05 LAB — COMPREHENSIVE METABOLIC PANEL
ALK PHOS: 67 U/L (ref 38–126)
ALT: 15 U/L (ref 0–44)
AST: 19 U/L (ref 15–41)
Albumin: 4.1 g/dL (ref 3.5–5.0)
Anion gap: 7 (ref 5–15)
BILIRUBIN TOTAL: 0.6 mg/dL (ref 0.3–1.2)
BUN: 19 mg/dL (ref 8–23)
CO2: 25 mmol/L (ref 22–32)
CREATININE: 1.01 mg/dL — AB (ref 0.44–1.00)
Calcium: 8.8 mg/dL — ABNORMAL LOW (ref 8.9–10.3)
Chloride: 105 mmol/L (ref 98–111)
GFR calc Af Amer: >60 mL/min (ref >60)
GFR calc non Af Amer: 54 mL/min — ABNORMAL LOW (ref >60)
Glucose, Bld: 172 mg/dL — ABNORMAL HIGH (ref 70–99)
Potassium: 4 mmol/L (ref 3.5–5.1)
Sodium: 137 mmol/L (ref 135–145)
Total Protein: 8 g/dL (ref 6.5–8.1)

## 2019-03-05 NOTE — Assessment & Plan Note (Addendum)
#  Left breast ipsilateral chest wall recurrence stage IV [2015];  Jan 2020-PET scan suggestive of mediastinal/hilar adenopathy; single right iliac bone lesion.  Concerning for progressive disease.  Stable.  # will plan to start abema 100 mg/day; if UA- normal today.   # Tremors/fatigue/poor aptetite/ no weight loss-secondary to abemaciclib 100 twice daily. Resolved.    #Osteopenia in the bone density test [ BMD- feb 2020]; on ca+vit D; STABLE.  Single bone lesion/consider Zometa  # HTN -Creatinine at 0.9-1. STABLE;  continue lisinopril/ norvasc 5 mg/d.  # Dysuria x 3 days-s/p bactrim improved;   # Depression- STABLE.  # DISPOSITION:   # follow up with MD/labs- cbc/cmp/in 2 weeks;/ca27-29; Faslodex- Dr.B  Addendum: UA is negative; proceed with Abema 100 mg twice a day.

## 2019-03-05 NOTE — Progress Notes (Signed)
[place] OFFICE PROGRESS NOTE  Patient Care Team: [name], MD as PCP - General (Internal Medicine) Bary Castilla, Forest Gleason, MD (General Surgery) Marden Noble, MD (Internal Medicine)  Dr.Charles Mayer Masker; [place] HISTORY:  Oncology History   # 2007- LEFT BREAST CA STAGE II [T2N87m; s/p mastec; Dr.Byrnett] ER-Pos/PR Neg; Her- 2 Neu- NEG; ACx4-Taxol x12;Femara; stopped May 2009-stopped follow up.  #JAN 2015- Post mastectomy Chest wall recurrence [chest wall mass bx-]; ER- 90%; PR- NEG; Her2 Neu- NEG; s/p excision [involving skeletal muscle/adipose tissue; clear margins]; CT July 2017- NED;   # Jan 2020- progression/ hilar- START abema [jan 14th]+ faslodex[jan 13th]  # OSTEOPENIA [BMD- June 2016]  # Kidney cysts  DIAGNOSIS: LEFT BREAST CA  STAGE: IV- NED  ;GOALS: pallaitive  CURRENT/MOST RECENT THERAPY: faslodex+ Abema      [place]   02/05/2014 Initial Diagnosis    Breast cancer metastasized to skin ([facility]     Malignant neoplasm of overlapping sites of left breast in female, estrogen receptor positive ([facility]   07/24/2016 Initial Diagnosis    Cancer of overlapping sites of left female breast ([facility]    01/06/2019 -  Chemotherapy    The patient had fulvestrant (FASLODEX) injection 500 mg, 500 mg, Intramuscular,  Once, 0 of 4 cycles Dose modification: 500 mg (original dose 500 mg, Cycle 1)  for chemotherapy treatment.       INTERVAL HISTORY:  A very pleasant 78year old African-American female patient with above history of ER/PR positive breast cancer stage IV currently on Faslodex is here for follow-up.  At the last visit patient was treated with antibiotics for UTI.  Currently symptoms resolved.  Continues to have slightly increased frequency.   Review of Systems  Constitutional: Positive for malaise/fatigue. Negative for chills, diaphoresis, fever and weight loss.  HENT: Negative for  nosebleeds and sore throat.   Eyes: Negative for double vision.  Respiratory: Negative for cough, hemoptysis, sputum production, shortness of breath and wheezing.   Cardiovascular: Negative for chest pain, palpitations, orthopnea and leg swelling.  Gastrointestinal: Negative for abdominal pain, blood in stool, constipation, diarrhea, heartburn, melena, nausea and vomiting.  Genitourinary: Positive for frequency.  Musculoskeletal: Positive for back pain and joint pain.  Skin: Negative.  Negative for itching and rash.  Neurological: Negative for tingling, focal weakness, weakness and headaches.  Endo/Heme/Allergies: Does not bruise/bleed easily.  Psychiatric/Behavioral: Negative for depression. The patient is not nervous/anxious and does not have insomnia.      PAST MEDICAL HISTORY :  Past Medical History:  Diagnosis Date  . Aneurysm ([facility] 2007  . Arthritis   . Cancer ([facility] 2007   diagnosed 01/07 left breast with simple mastectomu & sn bx. The pt had a 3.5cm tumor. Frozen section report on the 2 sn were negative. On permanent sections a 0.567mmicrometastasis was identified. She was not felt to require a complete axillary dissection. She has completed her Tamoxifen therapy and has been released from routine f/u with medical oncology service.  . Diabetes mellitus without complication ([facility]  . Hyperlipidemia   . Hypertension    since age 78. [facility]   Mastectomy site recurrence resected 01/26/2014, ER 90%, PR 0%, HER-2/neu nonamplified.  . Other benign neoplasm of connective and other soft tissue of trunk, unspecified 2014  . Personal history of colonic polyps   . Personal history of tobacco use,  presenting hazards to health   . Special screening for malignant neoplasms, colon   . Unspecified disorder of skin and subcutaneous tissue 02/19/2013   biopsy of skin over the sternum on the right breast showed hypertrophic  scar,keloid formation.    PAST SURGICAL HISTORY :   Past Surgical History:  Procedure Laterality Date  . ABDOMINAL HYSTERECTOMY  2004   total  . BREAST BIOPSY Right January 26, 2014   Core biopsy for mild increase breast uptake on PET scan, usual ductal hyperplasia and stromal fibrosis  . BREAST SURGERY Left 2007   mastectomy  . chest wall mass  01/26/14  . CHOLECYSTECTOMY  2007  . COLONOSCOPY W/ POLYPECTOMY  2011   Dr. Brynda Greathouse  . EYE SURGERY Right    cataract surgery  . head surgery  1991  . MASTECTOMY Left 2007  . PORT-A-CATH REMOVAL  2008  . PORTACATH PLACEMENT  2007    FAMILY HISTORY :   Family History  Problem Relation Age of Onset  . Cancer Other        ovarian,breast,colon cancers;relations not listed  . Breast cancer Neg Hx     SOCIAL HISTORY:   Social History   Tobacco Use  . Smoking status: Current Some Day Smoker    Packs/day: 1.00    Years: 50.00    Pack years: 50.00    Types: Cigarettes  . Smokeless tobacco: Never Used  Substance Use Topics  . Alcohol use: No    Alcohol/week: 0.0 standard drinks  . Drug use: No    ALLERGIES:  is allergic to metoprolol.  MEDICATIONS:  Current Outpatient Medications  Medication Sig Dispense Refill  . amLODipine (NORVASC) 5 MG tablet Take 1 tablet (5 mg total) by mouth daily. 30 tablet 2  . atorvastatin (LIPITOR) 20 MG tablet Take 20 mg by mouth daily at 6 PM.     . Calcium Carbonate-Vitamin D 600-200 MG-UNIT CAPS Take 200 capsules by mouth once.    . citalopram (CELEXA) 40 MG tablet Take 1 tablet (40 mg total) by mouth daily. 30 tablet 0  . lisinopril (PRINIVIL,ZESTRIL) 2.5 MG tablet Take 1 tablet (2.5 mg total) by mouth daily. 30 tablet 3   No current facility-administered medications for this visit.     PHYSICAL EXAMINATION: ECOG PERFORMANCE STATUS: 0 - Asymptomatic  BP (!) 153/72 (BP Location: Right Arm, Patient Position: Sitting)   Pulse 79   Temp 97.6 F (36.4 C) (Tympanic)   Resp 20   Ht _0  (1.676  m)   Wt 155 lb (70.3 kg)   BMI 25.02 kg/m   Filed Weights   03/05/19 1252  Weight: 155 lb (70.3 kg)    Physical Exam  Constitutional: She is oriented to person, place, and time and well-developed, well-nourished, and in no distress.  Accompanied by daughter.  She is walking by self.  HENT:  Head: Normocephalic and atraumatic.  Mouth/Throat: Oropharynx is clear and moist. No oropharyngeal exudate.  Eyes: Pupils are equal, round, and reactive to light.  Neck: Normal range of motion. Neck supple.  Cardiovascular: Normal rate and regular rhythm.  Pulmonary/Chest: No respiratory distress. She has no wheezes.  Abdominal: Soft. Bowel sounds are normal. She exhibits no distension and no mass. There is no abdominal tenderness. There is no rebound and no guarding.  Musculoskeletal: Normal range of motion.        General: No tenderness or edema.  Neurological: She is alert and oriented to person, place, and time.  Skin: Skin  is warm.  Keloid noted in the right inner lower quadrant of the breast.  No new lumps or bumps noted.  Psychiatric: Affect normal.     LABORATORY DATA:  I have reviewed the data as listed    Component Value Date/Time   NA 137 03/05/2019 1234   NA 139 05/30/2014 0127   K 4.0 03/05/2019 1234   K 3.7 05/30/2014 0127   CL 105 03/05/2019 1234   CL 106 05/30/2014 0127   CO2 25 03/05/2019 1234   CO2 25 05/30/2014 0127   GLUCOSE 172 (H) 03/05/2019 1234   GLUCOSE 136 (H) 05/30/2014 0127   BUN 19 03/05/2019 1234   BUN 19 (H) 05/30/2014 0127   CREATININE 1.01 (H) 03/05/2019 1234   CREATININE 0.98 05/30/2014 0127   CALCIUM 8.8 (L) 03/05/2019 1234   CALCIUM 9.0 05/30/2014 0127   PROT 8.0 03/05/2019 1234   PROT 7.8 05/08/2014 1131   ALBUMIN 4.1 03/05/2019 1234   ALBUMIN 3.7 05/08/2014 1131   AST 19 03/05/2019 1234   AST 20 05/08/2014 1131   ALT 15 03/05/2019 1234   ALT 18 05/08/2014 1131   ALKPHOS 67 03/05/2019 1234   ALKPHOS 58 05/08/2014 1131   BILITOT 0.6  03/05/2019 1234   BILITOT 0.4 05/08/2014 1131   GFRNONAA 54 (L) 03/05/2019 1234   GFRNONAA 58 (L) 05/30/2014 0127   GFRAA >60 03/05/2019 1234   GFRAA >60 05/30/2014 0127    No results found for: SPEP, UPEP  Lab Results  Component Value Date   WBC 6.8 03/05/2019   NEUTROABS 3.5 03/05/2019   HGB 10.2 (L) 03/05/2019   HCT 33.0 (L) 03/05/2019   MCV 91.7 03/05/2019   PLT 222 03/05/2019      Chemistry      Component Value Date/Time   NA 137 03/05/2019 1234   NA 139 05/30/2014 0127   K 4.0 03/05/2019 1234   K 3.7 05/30/2014 0127   CL 105 03/05/2019 1234   CL 106 05/30/2014 0127   CO2 25 03/05/2019 1234   CO2 25 05/30/2014 0127   BUN 19 03/05/2019 1234   BUN 19 (H) 05/30/2014 0127   CREATININE 1.01 (H) 03/05/2019 1234   CREATININE 0.98 05/30/2014 0127      Component Value Date/Time   CALCIUM 8.8 (L) 03/05/2019 1234   CALCIUM 9.0 05/30/2014 0127   ALKPHOS 67 03/05/2019 1234   ALKPHOS 58 05/08/2014 1131   AST 19 03/05/2019 1234   AST 20 05/08/2014 1131   ALT 15 03/05/2019 1234   ALT 18 05/08/2014 1131   BILITOT 0.6 03/05/2019 1234   BILITOT 0.4 05/08/2014 1131        RADIOGRAPHIC STUDIES: I have personally reviewed the radiological images as listed and agreed with the findings in the report. No results found.   ASSESSMENT & PLAN:   Malignant neoplasm of overlapping sites of left breast in female, estrogen receptor positive ([place]) #Left breast ipsilateral chest wall recurrence stage IV [2015];  Jan 2020-PET scan suggestive of mediastinal/hilar adenopathy; single right iliac bone lesion.  Concerning for progressive disease.  Stable.  # will plan to start abema 100 mg/day; if UA- normal today.   # Tremors/fatigue/poor aptetite/ no weight loss-secondary to abemaciclib 100 twice daily. Resolved.    #Osteopenia in the bone density test [ BMD- feb 2020]; on ca+vit D; STABLE.  Single bone lesion/consider Zometa  # HTN -Creatinine at 0.9-1. STABLE;  continue  lisinopril/ norvasc 5 mg/d.  # Dysuria x 3 days-s/p bactrim improved;   #  Depression- STABLE.  # DISPOSITION:   # follow up with MD/labs- cbc/cmp/in 2 weeks;/ca27-29; Faslodex- Dr.B  Addendum: UA is negative; proceed with Abema 100 mg twice a day.        [name], MD 03/11/2019 7:58 PM

## 2019-03-06 ENCOUNTER — Telehealth: Payer: Self-pay | Admitting: *Deleted

## 2019-03-06 LAB — CANCER ANTIGEN 27.29: CA 27.29: 69.2 U/mL — ABNORMAL HIGH (ref 0.0–38.6)

## 2019-03-06 NOTE — Telephone Encounter (Signed)
Call from family member asking if urine results are back and if the patient can start her oral chemotherapy or need more antibiotics.Please advise  ...   Ref Range & Units 1d ago 2wk ago  Color, Urine YELLOW YELLOW  YELLOWAbnormal    APPearance CLEAR CLEAR  CLEARAbnormal    Specific Gravity, Urine 1.005 - 1.030 1.015  1.018   pH 5.0 - 8.0 6.0  5.0   Glucose, UA NEGATIVE mg/dL NEGATIVE  NEGATIVE   Hgb urine dipstick NEGATIVE NEGATIVE  NEGATIVE   Bilirubin Urine NEGATIVE NEGATIVE  NEGATIVE   Ketones, ur NEGATIVE mg/dL NEGATIVE  NEGATIVE   Protein, ur NEGATIVE mg/dL NEGATIVE  NEGATIVE   Nitrite NEGATIVE NEGATIVE  NEGATIVE   Leukocytes,Ua NEGATIVE MODERATEAbnormal   MODERATEAbnormal    Squamous Epithelial / LPF 0 - 5 0-5  0-5   WBC, UA 0 - 5 WBC/hpf 6-10  >50High    RBC / HPF 0 - 5 RBC/hpf 0-5  0-5   Bacteria, UA NONE SEEN FEWAbnormal   NONE SEEN   Mucus  PRESENT  PRESENT CM  Comment: Performed at [facility], [address] [place], [place] [address]  Resulting Agency  [facility] CLIN LAB [facility] CLIN LAB      Specimen Collected: 03/05/19 13:52  Last Resulted: 03/05/19 15:02

## 2019-03-07 LAB — URINE CULTURE

## 2019-03-07 NOTE — Telephone Encounter (Signed)
Jessica Compton- ok to start abema 100 mg once day.  NO obvious evidence of infection Please inform family.

## 2019-03-07 NOTE — Telephone Encounter (Signed)
I spoke with Jessica Compton and she repeated back to me

## 2019-03-13 ENCOUNTER — Other Ambulatory Visit: Payer: Self-pay | Admitting: Internal Medicine

## 2019-03-13 DIAGNOSIS — Z853 Personal history of malignant neoplasm of breast: Secondary | ICD-10-CM

## 2019-03-14 ENCOUNTER — Other Ambulatory Visit: Payer: Self-pay | Admitting: Internal Medicine

## 2019-03-17 MED FILL — VERZENIO 100 MG TAB: 100 | 28 days supply | Qty: 56 | Fill #1

## 2019-03-18 ENCOUNTER — Other Ambulatory Visit: Payer: Self-pay

## 2019-03-19 ENCOUNTER — Inpatient Hospital Stay: Payer: Medicare HMO

## 2019-03-19 ENCOUNTER — Other Ambulatory Visit: Payer: Self-pay

## 2019-03-19 ENCOUNTER — Inpatient Hospital Stay (HOSPITAL_BASED_OUTPATIENT_CLINIC_OR_DEPARTMENT_OTHER): Payer: Medicare HMO | Admitting: Internal Medicine

## 2019-03-19 VITALS — BP 171/76 | HR 67 | Temp 97.6°F | Resp 16 | Wt 154.8 lb

## 2019-03-19 DIAGNOSIS — Z853 Personal history of malignant neoplasm of breast: Secondary | ICD-10-CM

## 2019-03-19 DIAGNOSIS — M8580 Other specified disorders of bone density and structure, unspecified site: Secondary | ICD-10-CM

## 2019-03-19 DIAGNOSIS — F17210 Nicotine dependence, cigarettes, uncomplicated: Secondary | ICD-10-CM | POA: Diagnosis not present

## 2019-03-19 DIAGNOSIS — Z170 Estrogen receptor positive status [ER+]: Principal | ICD-10-CM

## 2019-03-19 DIAGNOSIS — Z9013 Acquired absence of bilateral breasts and nipples: Secondary | ICD-10-CM | POA: Diagnosis not present

## 2019-03-19 DIAGNOSIS — R300 Dysuria: Secondary | ICD-10-CM | POA: Diagnosis not present

## 2019-03-19 DIAGNOSIS — F329 Major depressive disorder, single episode, unspecified: Secondary | ICD-10-CM | POA: Diagnosis not present

## 2019-03-19 DIAGNOSIS — I10 Essential (primary) hypertension: Secondary | ICD-10-CM | POA: Diagnosis not present

## 2019-03-19 DIAGNOSIS — I129 Hypertensive chronic kidney disease with stage 1 through stage 4 chronic kidney disease, or unspecified chronic kidney disease: Secondary | ICD-10-CM

## 2019-03-19 DIAGNOSIS — C50812 Malignant neoplasm of overlapping sites of left female breast: Secondary | ICD-10-CM

## 2019-03-19 DIAGNOSIS — Z79899 Other long term (current) drug therapy: Secondary | ICD-10-CM | POA: Diagnosis not present

## 2019-03-19 DIAGNOSIS — N183 Chronic kidney disease, stage 3 (moderate): Secondary | ICD-10-CM | POA: Diagnosis not present

## 2019-03-19 LAB — CBC WITH DIFFERENTIAL/PLATELET
Abs Immature Granulocytes: 0.03 10*3/uL (ref 0.00–0.07)
BASOS PCT: 0 %
Basophils Absolute: 0 10*3/uL (ref 0.0–0.1)
EOS ABS: 0.3 10*3/uL (ref 0.0–0.5)
Eosinophils Relative: 5 %
HCT: 33.9 % — ABNORMAL LOW (ref 36.0–46.0)
Hemoglobin: 10.3 g/dL — ABNORMAL LOW (ref 12.0–15.0)
Immature Granulocytes: 1 %
Lymphocytes Relative: 39 %
Lymphs Abs: 2.4 10*3/uL (ref 0.7–4.0)
MCH: 28.2 pg (ref 26.0–34.0)
MCHC: 30.4 g/dL (ref 30.0–36.0)
MCV: 92.9 fL (ref 80.0–100.0)
Monocytes Absolute: 0.4 10*3/uL (ref 0.1–1.0)
Monocytes Relative: 6 %
Neutro Abs: 3 10*3/uL (ref 1.7–7.7)
Neutrophils Relative %: 49 %
PLATELETS: 242 10*3/uL (ref 150–400)
RBC: 3.65 MIL/uL — ABNORMAL LOW (ref 3.87–5.11)
RDW: 15 % (ref 11.5–15.5)
WBC: 6.1 10*3/uL (ref 4.0–10.5)
nRBC: 0 % (ref 0.0–0.2)

## 2019-03-19 LAB — COMPREHENSIVE METABOLIC PANEL WITH GFR
ALT: 9 U/L (ref 0–44)
AST: 16 U/L (ref 15–41)
Albumin: 4 g/dL (ref 3.5–5.0)
Alkaline Phosphatase: 66 U/L (ref 38–126)
Anion gap: 7 (ref 5–15)
BUN: 19 mg/dL (ref 8–23)
CO2: 27 mmol/L (ref 22–32)
Calcium: 9 mg/dL (ref 8.9–10.3)
Chloride: 105 mmol/L (ref 98–111)
Creatinine, Ser: 1.21 mg/dL — ABNORMAL HIGH (ref 0.44–1.00)
GFR calc Af Amer: 50 mL/min — ABNORMAL LOW
GFR calc non Af Amer: 43 mL/min — ABNORMAL LOW
Glucose, Bld: 95 mg/dL (ref 70–99)
Potassium: 3.9 mmol/L (ref 3.5–5.1)
Sodium: 139 mmol/L (ref 135–145)
Total Bilirubin: 0.6 mg/dL (ref 0.3–1.2)
Total Protein: 7.7 g/dL (ref 6.5–8.1)

## 2019-03-19 MED ORDER — AMLODIPINE BESYLATE 10 MG PO TABS: 10.0000 mg | ORAL_TABLET | Freq: Every day | ORAL | 1 refills | Status: DC

## 2019-03-19 MED ORDER — FULVESTRANT 250 MG/5ML IM SOLN
500.0000 mg | Freq: Once | INTRAMUSCULAR | Status: AC
  Administered 2019-03-19: 500 mg via INTRAMUSCULAR
  Filled 2019-03-19: qty 10

## 2019-03-19 NOTE — Assessment & Plan Note (Addendum)
#  Left breast ipsilateral chest wall recurrence stage IV [2015];  Jan 2020-PET scan suggestive of mediastinal/hilar adenopathy; single right iliac bone lesion.  Concerning for progressive disease. Clinically stable.   # continue abema 100 mg once a day.  Labs today reviewed;  acceptable for treatment today.  Poor tolerance to higher dose.  Recommend CT scan of the chest in approximately 1 month.  #Osteopenia in the bone density test [ BMD- feb 2020]; on ca+vit D; STABLE.  Hold Zometa for now.  # HTN- poorly controlled; at home 150s.  Recommend increasing the dose of Norvasc to 10 mg a day.  New prescription given.  Continue lisinopril.  # CKD- stage III-   continue lisinopril/increased dose of Norvasc see above.  Creatinine 1.2/recommend increased fluid intake.  # UTI- resolved.   # Depression- STABLE.  # Educated the patient regarding novel coronavirus-modes of transmission/risks; and measures to avoid infection.   # DISPOSITION:   # Faslodex today.  # follow up with MD/labs- cbc/cmp/in 4 weeks;CT scan prior/ca27-29; Faslodex- Dr.B

## 2019-03-19 NOTE — Patient Instructions (Signed)
#   Take amlodipine 10 mg once a day.

## 2019-03-19 NOTE — Progress Notes (Signed)
[place] OFFICE PROGRESS NOTE  Patient Care Team: [name], MD as PCP - General (Internal Medicine) Bary Castilla, Forest Gleason, MD (General Surgery) Marden Noble, MD (Internal Medicine)  Dr.Charles Mayer Masker; [place] HISTORY:  Oncology History   # 2007- LEFT BREAST CA STAGE II [T2N54m; s/p mastec; Dr.Byrnett] ER-Pos/PR Neg; Her- 2 Neu- NEG; ACx4-Taxol x12;Femara; stopped May 2009-stopped follow up.  #JAN 2015- Post mastectomy Chest wall recurrence [chest wall mass bx-]; ER- 90%; PR- NEG; Her2 Neu- NEG; s/p excision [involving skeletal muscle/adipose tissue; clear margins]; CT July 2017- NED;   # Jan 2020- progression/ hilar- START abema [jan 14th]+ faslodex[jan 13th]  # OSTEOPENIA [BMD- June 2016]  # Kidney cysts  DIAGNOSIS: LEFT BREAST CA  STAGE: IV- NED  ;GOALS: pallaitive  CURRENT/MOST RECENT THERAPY: faslodex+ Abema      Breast cancer [place] to skin ([place]   02/05/2014 Initial Diagnosis    Breast cancer [place]as[place] to skin ([place]     Malignant neoplasm of overlapping sites of left breast in female, estrogen receptor positive ([place]   07/24/2016 Initial Diagnosis    Cancer of overlapping sites of left female breast ([place]    01/06/2019 -  Chemotherapy    The patient had fulvestrant (FASLODEX) injection 500 mg, 500 mg, Intramuscular,  Once, 0 of 4 cycles Dose modification: 500 mg (original dose 500 mg, Cycle 1)  for chemotherapy treatment.       INTERVAL HISTORY:  A very pleasant 78year old African-American female patient with above history of ER/PR positive breast cancer stage IV currently on Faslodex + abema is here for follow-up.  Patient was started on abema 100 mg once a day given the poor tolerance/recent UTI.  Denies any diarrhea.  Denies any nausea vomiting.  No fevers or chills.  Review of Systems  Constitutional: Positive for malaise/fatigue. Negative for chills, diaphoresis, fever and weight loss.   HENT: Negative for nosebleeds and sore throat.   Eyes: Negative for double vision.  Respiratory: Negative for cough, hemoptysis, sputum production, shortness of breath and wheezing.   Cardiovascular: Negative for chest pain, palpitations, orthopnea and leg swelling.  Gastrointestinal: Negative for abdominal pain, blood in stool, constipation, diarrhea, heartburn, melena, nausea and vomiting.  Musculoskeletal: Positive for back pain and joint pain.  Skin: Negative.  Negative for itching and rash.  Neurological: Negative for tingling, focal weakness, weakness and headaches.  Endo/Heme/Allergies: Does not bruise/bleed easily.  Psychiatric/Behavioral: Negative for depression. The patient is not nervous/anxious and does not have insomnia.      PAST MEDICAL HISTORY :  Past Medical History:  Diagnosis Date  . Aneurysm ([place] 2007  . Arthritis   . Cancer ([facility] 2007   diagnosed 01/07 left breast with simple mastectomu & sn bx. The pt had a 3.5cm tumor. Frozen section report on the 2 sn were negative. On permanent sections a 0.547mmicrometastasis was identified. She was not felt to require a complete axillary dissection. She has completed her Tamoxifen therapy and has been released from routine f/u with medical oncology service.  . Diabetes mellitus without complication ([facility]  . Hyperlipidemia   . Hypertension    since age 78. [facility]outer quadrant [facility]   Mastectomy site recurrence resected 01/26/2014, ER 90%, PR 0%, HER-2/neu nonamplified.  . Other benign neoplasm of connective and other soft tissue of trunk, unspecified 2014  . Personal history of colonic polyps   . Personal history  of tobacco use, presenting hazards to health   . Special screening for malignant neoplasms, colon   . Unspecified disorder of skin and subcutaneous tissue 02/19/2013   biopsy of skin over the sternum on the right breast showed hypertrophic scar,keloid  formation.    PAST SURGICAL HISTORY :   Past Surgical History:  Procedure Laterality Date  . ABDOMINAL HYSTERECTOMY  2004   total  . BREAST BIOPSY Right January 26, 2014   Core biopsy for mild increase breast uptake on PET scan, usual ductal hyperplasia and stromal fibrosis  . BREAST SURGERY Left 2007   mastectomy  . chest wall mass  01/26/14  . CHOLECYSTECTOMY  2007  . COLONOSCOPY W/ POLYPECTOMY  2011   Dr. Brynda Greathouse  . EYE SURGERY Right    cataract surgery  . head surgery  1991  . MASTECTOMY Left 2007  . PORT-A-CATH REMOVAL  2008  . PORTACATH PLACEMENT  2007    FAMILY HISTORY :   Family History  Problem Relation Age of Onset  . Cancer Other        ovarian,breast,colon cancers;relations not listed  . Breast cancer Neg Hx     SOCIAL HISTORY:   Social History   Tobacco Use  . Smoking status: Current Some Day Smoker    Packs/day: 1.00    Years: 50.00    Pack years: 50.00    Types: Cigarettes  . Smokeless tobacco: Never Used  Substance Use Topics  . Alcohol use: No    Alcohol/week: 0.0 standard drinks  . Drug use: No    ALLERGIES:  is allergic to metoprolol.  MEDICATIONS:  Current Outpatient Medications  Medication Sig Dispense Refill  . amLODipine (NORVASC) 10 MG tablet Take 1 tablet (10 mg total) by mouth daily. 90 tablet 1  . atorvastatin (LIPITOR) 20 MG tablet Take 20 mg by mouth daily at 6 PM.     . Calcium Carbonate-Vitamin D 600-200 MG-UNIT CAPS Take 200 capsules by mouth once.    . citalopram (CELEXA) 40 MG tablet TAKE 1 TABLET BY MOUTH EVERY DAY 90 tablet 1  . lisinopril (PRINIVIL,ZESTRIL) 2.5 MG tablet Take 1 tablet (2.5 mg total) by mouth daily. 30 tablet 3   No current facility-administered medications for this visit.    Facility-Administered Medications Ordered in Other Visits  Medication Dose Route Frequency Provider Last Rate Last Dose  . fulvestrant (FASLODEX) injection 500 mg  500 mg Intramuscular Once [name], MD        PHYSICAL  EXAMINATION: ECOG PERFORMANCE STATUS: 0 - Asymptomatic  BP (!) 171/76 (BP Location: Left Arm, Patient Position: Sitting, Cuff Size: Normal)   Pulse 67   Temp 97.6 F (36.4 C) (Tympanic)   Resp 16   Wt 154 lb 12.8 oz (70.2 kg)   BMI 24.99 kg/m   Filed Weights   03/19/19 1039  Weight: 154 lb 12.8 oz (70.2 kg)    Physical Exam  Constitutional: She is oriented to person, place, and time and well-developed, well-nourished, and in no distress.  Accompanied by daughter.  She is walking by self.  HENT:  Head: Normocephalic and atraumatic.  Mouth/Throat: Oropharynx is clear and moist. No oropharyngeal exudate.  Eyes: Pupils are equal, round, and reactive to light.  Neck: Normal range of motion. Neck supple.  Cardiovascular: Normal rate and regular rhythm.  Pulmonary/Chest: No respiratory distress. She has no wheezes.  Abdominal: Soft. Bowel sounds are normal. She exhibits no distension and no mass. There is no abdominal tenderness. There  is no rebound and no guarding.  Musculoskeletal: Normal range of motion.        General: No tenderness or edema.  Neurological: She is alert and oriented to person, place, and time.  Skin: Skin is warm.  Psychiatric: Affect normal.     LABORATORY DATA:  I have reviewed the data as listed    Component Value Date/Time   NA 139 03/19/2019 1009   NA 139 05/30/2014 0127   K 3.9 03/19/2019 1009   K 3.7 05/30/2014 0127   CL 105 03/19/2019 1009   CL 106 05/30/2014 0127   CO2 27 03/19/2019 1009   CO2 25 05/30/2014 0127   GLUCOSE 95 03/19/2019 1009   GLUCOSE 136 (H) 05/30/2014 0127   BUN 19 03/19/2019 1009   BUN 19 (H) 05/30/2014 0127   CREATININE 1.21 (H) 03/19/2019 1009   CREATININE 0.98 05/30/2014 0127   CALCIUM 9.0 03/19/2019 1009   CALCIUM 9.0 05/30/2014 0127   PROT 7.7 03/19/2019 1009   PROT 7.8 05/08/2014 1131   ALBUMIN 4.0 03/19/2019 1009   ALBUMIN 3.7 05/08/2014 1131   AST 16 03/19/2019 1009   AST 20 05/08/2014 1131   ALT 9  03/19/2019 1009   ALT 18 05/08/2014 1131   ALKPHOS 66 03/19/2019 1009   ALKPHOS 58 05/08/2014 1131   BILITOT 0.6 03/19/2019 1009   BILITOT 0.4 05/08/2014 1131   GFRNONAA 43 (L) 03/19/2019 1009   GFRNONAA 58 (L) 05/30/2014 0127   GFRAA 50 (L) 03/19/2019 1009   GFRAA >60 05/30/2014 0127    No results found for: SPEP, UPEP  Lab Results  Component Value Date   WBC 6.1 03/19/2019   NEUTROABS 3.0 03/19/2019   HGB 10.3 (L) 03/19/2019   HCT 33.9 (L) 03/19/2019   MCV 92.9 03/19/2019   PLT 242 03/19/2019      Chemistry      Component Value Date/Time   NA 139 03/19/2019 1009   NA 139 05/30/2014 0127   K 3.9 03/19/2019 1009   K 3.7 05/30/2014 0127   CL 105 03/19/2019 1009   CL 106 05/30/2014 0127   CO2 27 03/19/2019 1009   CO2 25 05/30/2014 0127   BUN 19 03/19/2019 1009   BUN 19 (H) 05/30/2014 0127   CREATININE 1.21 (H) 03/19/2019 1009   CREATININE 0.98 05/30/2014 0127      Component Value Date/Time   CALCIUM 9.0 03/19/2019 1009   CALCIUM 9.0 05/30/2014 0127   ALKPHOS 66 03/19/2019 1009   ALKPHOS 58 05/08/2014 1131   AST 16 03/19/2019 1009   AST 20 05/08/2014 1131   ALT 9 03/19/2019 1009   ALT 18 05/08/2014 1131   BILITOT 0.6 03/19/2019 1009   BILITOT 0.4 05/08/2014 1131        RADIOGRAPHIC STUDIES: I have personally reviewed the radiological images as listed and agreed with the findings in the report. No results found.   ASSESSMENT & PLAN:   Malignant neoplasm of overlapping sites of left breast in female, estrogen receptor positive ([place]) #Left breast ipsilateral chest wall recurrence stage IV [2015];  Jan 2020-PET scan suggestive of mediastinal/hilar adenopathy; single right iliac bone lesion.  Concerning for progressive disease. Clinically stable.   # continue abema 100 mg once a day.  Labs today reviewed;  acceptable for treatment today.  Poor tolerance to higher dose.  Recommend CT scan of the chest in approximately 1 month.  #Osteopenia in the bone  density test [ BMD- feb 2020]; on ca+vit D; STABLE.  Hold Zometa for now.  # HTN- poorly controlled; at home 150s.  Recommend increasing the dose of Norvasc to 10 mg a day.  New prescription given.  Continue lisinopril.  # CKD- stage III-   continue lisinopril/increased dose of Norvasc see above.  Creatinine 1.2/recommend increased fluid intake.  # UTI- resolved.   # Depression- STABLE.  # Educated the patient regarding novel coronavirus-modes of transmission/risks; and measures to avoid infection.   # DISPOSITION:   # Faslodex today.  # follow up with MD/labs- cbc/cmp/in 4 weeks;CT scan prior/ca27-29; Faslodex- Dr.B       [name], MD 03/19/2019 11:25 AM

## 2019-03-20 LAB — CANCER ANTIGEN 27.29: CAN 27.29: 69 U/mL — AB (ref 0.0–38.6)

## 2019-03-27 ENCOUNTER — Telehealth: Payer: Self-pay | Admitting: Pharmacist

## 2019-03-27 NOTE — Telephone Encounter (Signed)
Oral Chemotherapy Pharmacist Encounter  Follow-Up Form  Called patient today to follow up regarding patient's oral chemotherapy medication: Verzenio (abemaciclib)  Original Start date of oral chemotherapy: 12/2018  Pt reports 0 tablets/doses of Verzenio missed in the last month.   Pt reports the following side effects: None reported  Recent labs reviewed: Ca 27.29 from 03/19/19  New medications?: None reported  Other Issues: None reported  Patient knows to call the office with questions or concerns. Oral Oncology Clinic will continue to follow.  [name], PharmD, BCPS, [facility] Hematology/Oncology Clinical Pharmacist [facility]/HP/AP [facility] [phone]  03/27/2019 3:23 PM

## 2019-04-15 ENCOUNTER — Other Ambulatory Visit: Payer: Self-pay

## 2019-04-15 ENCOUNTER — Ambulatory Visit
Admission: RE | Admit: 2019-04-15 | Discharge: 2019-04-15 | Disposition: A | Discharge status: Home / Self Care | Payer: Medicare HMO | Source: Ambulatory Visit | Attending: Internal Medicine | Admitting: Internal Medicine

## 2019-04-15 DIAGNOSIS — Z170 Estrogen receptor positive status [ER+]: Principal | ICD-10-CM

## 2019-04-15 DIAGNOSIS — C50812 Malignant neoplasm of overlapping sites of left female breast: Secondary | ICD-10-CM | POA: Diagnosis not present

## 2019-04-15 DIAGNOSIS — J984 Other disorders of lung: Secondary | ICD-10-CM | POA: Diagnosis not present

## 2019-04-15 MED ORDER — IOHEXOL 300 MG/ML  SOLN
75.0000 mL | Freq: Once | INTRAMUSCULAR | Status: AC | PRN
  Administered 2019-04-15: 60 mL via INTRAVENOUS

## 2019-04-16 ENCOUNTER — Inpatient Hospital Stay (HOSPITAL_BASED_OUTPATIENT_CLINIC_OR_DEPARTMENT_OTHER): Payer: Medicare HMO | Admitting: Internal Medicine

## 2019-04-16 ENCOUNTER — Other Ambulatory Visit: Payer: Self-pay

## 2019-04-16 ENCOUNTER — Inpatient Hospital Stay: Payer: Medicare HMO

## 2019-04-16 ENCOUNTER — Inpatient Hospital Stay: Payer: Medicare HMO | Attending: Internal Medicine

## 2019-04-16 DIAGNOSIS — I10 Essential (primary) hypertension: Secondary | ICD-10-CM | POA: Diagnosis not present

## 2019-04-16 DIAGNOSIS — C7951 Secondary malignant neoplasm of bone: Secondary | ICD-10-CM | POA: Diagnosis not present

## 2019-04-16 DIAGNOSIS — N183 Chronic kidney disease, stage 3 (moderate): Secondary | ICD-10-CM | POA: Diagnosis not present

## 2019-04-16 DIAGNOSIS — Z170 Estrogen receptor positive status [ER+]: Principal | ICD-10-CM

## 2019-04-16 DIAGNOSIS — C50812 Malignant neoplasm of overlapping sites of left female breast: Secondary | ICD-10-CM

## 2019-04-16 DIAGNOSIS — F329 Major depressive disorder, single episode, unspecified: Secondary | ICD-10-CM | POA: Insufficient documentation

## 2019-04-16 DIAGNOSIS — Z79818 Long term (current) use of other agents affecting estrogen receptors and estrogen levels: Secondary | ICD-10-CM

## 2019-04-16 DIAGNOSIS — I129 Hypertensive chronic kidney disease with stage 1 through stage 4 chronic kidney disease, or unspecified chronic kidney disease: Secondary | ICD-10-CM | POA: Diagnosis not present

## 2019-04-16 LAB — COMPREHENSIVE METABOLIC PANEL
ALT: 12 U/L (ref 0–44)
AST: 20 U/L (ref 15–41)
Albumin: 4.1 g/dL (ref 3.5–5.0)
Alkaline Phosphatase: 60 U/L (ref 38–126)
Anion gap: 6 (ref 5–15)
BUN: 21 mg/dL (ref 8–23)
CO2: 29 mmol/L (ref 22–32)
Calcium: 9.4 mg/dL (ref 8.9–10.3)
Chloride: 103 mmol/L (ref 98–111)
Creatinine, Ser: 1.32 mg/dL — ABNORMAL HIGH (ref 0.44–1.00)
GFR calc Af Amer: 45 mL/min — ABNORMAL LOW (ref >60)
GFR calc non Af Amer: 39 mL/min — ABNORMAL LOW (ref >60)
Glucose, Bld: 134 mg/dL — ABNORMAL HIGH (ref 70–99)
Potassium: 4.4 mmol/L (ref 3.5–5.1)
Sodium: 138 mmol/L (ref 135–145)
Total Bilirubin: 0.6 mg/dL (ref 0.3–1.2)
Total Protein: 8.1 g/dL (ref 6.5–8.1)

## 2019-04-16 LAB — CBC WITH DIFFERENTIAL/PLATELET
Abs Immature Granulocytes: 0.01 10*3/uL (ref 0.00–0.07)
Basophils Absolute: 0 10*3/uL (ref 0.0–0.1)
Basophils Relative: 1 %
Eosinophils Absolute: 0.2 10*3/uL (ref 0.0–0.5)
Eosinophils Relative: 4 %
HCT: 34.8 % — ABNORMAL LOW (ref 36.0–46.0)
Hemoglobin: 10.8 g/dL — ABNORMAL LOW (ref 12.0–15.0)
Immature Granulocytes: 0 %
Lymphocytes Relative: 44 %
Lymphs Abs: 2.3 10*3/uL (ref 0.7–4.0)
MCH: 29.3 pg (ref 26.0–34.0)
MCHC: 31 g/dL (ref 30.0–36.0)
MCV: 94.3 fL (ref 80.0–100.0)
Monocytes Absolute: 0.5 10*3/uL (ref 0.1–1.0)
Monocytes Relative: 10 %
Neutro Abs: 2.1 10*3/uL (ref 1.7–7.7)
Neutrophils Relative %: 41 %
Platelets: 243 10*3/uL (ref 150–400)
RBC: 3.69 MIL/uL — ABNORMAL LOW (ref 3.87–5.11)
RDW: 15.2 % (ref 11.5–15.5)
WBC: 5.2 10*3/uL (ref 4.0–10.5)
nRBC: 0 % (ref 0.0–0.2)

## 2019-04-16 MED ORDER — FULVESTRANT 250 MG/5ML IM SOLN
500.0000 mg | Freq: Once | INTRAMUSCULAR | Status: AC
  Administered 2019-04-16: 500 mg via INTRAMUSCULAR

## 2019-04-16 NOTE — Progress Notes (Signed)
I connected with Jessica Compton on 04/16/19 at 11:00 AM EDT by telephone visit and verified that I am speaking with the correct person using two identifiers.  I discussed the limitations, risks, security and privacy concerns of performing an evaluation and management service by telemedicine and the availability of in-person appointments. I also discussed with the patient that there may be a patient responsible charge related to this service. The patient expressed understanding and agreed to proceed.    Other persons participating in the visit and their role in the encounter: daughter Patient's location: home  Provider's location: home   Oncology History   # 2007- LEFT BREAST CA STAGE II [T2N16m; s/p mastec; Dr.Byrnett] ER-Pos/PR Neg; Her- 2 Neu- NEG; ACx4-Taxol x12;Femara; stopped May 2009-stopped follow up.  #JAN 2015- Post mastectomy Chest wall recurrence [chest wall mass bx-]; ER- 90%; PR- NEG; Her2 Neu- NEG; s/p excision [involving skeletal muscle/adipose tissue; clear margins]; CT July 2017- NED;   # Jan 2020- progression/ hilar- START abema [jan 14th]+ faslodex[jan 13th]  # OSTEOPENIA [BMD- June 2016]  # Kidney cysts  DIAGNOSIS: LEFT BREAST CA  STAGE: IV- NED  ;GOALS: pallaitive  CURRENT/MOST RECENT THERAPY: faslodex+ Abema      Breast cancer metast[place]ized to skin ([place]   02/05/2014 Initial Diagnosis    Breast cancer metastasized to skin ([place]     Malignant neoplasm of overlapping sites of left breast in female, estrogen receptor positive ([facility]   07/24/2016 Initial Diagnosis    Cancer of overlapping sites of left female breast ([place]    01/06/2019 -  Chemotherapy    The patient had fulvestrant (FASLODEX) injection 500 mg, 500 mg, Intramuscular,  Once, 0 of 4 cycles Dose modification: 500 mg (original dose 500 mg, Cycle 1)  for chemotherapy treatment.       Chief Complaint: metastatic breast     History of present illness:Jessica MARMINDA [id number].o.  female with history of  metastatic breast cancer currently on Faslodex plus CDK inhibitor.  Patient had a CT scan 2 days ago.  No new shortness of breath or cough.  No diarrhea.  No headaches.  No nausea vomiting.  Observation/objective: Hemoglobin 10.5.  Creatinine 1.3.  Assessment and plan: Malignant neoplasm of overlapping sites of left breast in female, estrogen receptor positive ([place] #Left breast ipsilateral chest wall recurrence stage IV [2015]; currently on Faslodex plus abema '100mg'$ /day. April 2020 CT scan shows-stable mediastinal adenopathy/right pleural disease.  Tumor marker overall stable in 60s.  #Continue Faslodex+ abema.  Discussed that other options include chemotherapy which I would not recommend at this time/given COVID infection.  #Solitary bone lesion-asymptomatic.  Monitor for now; will discuss Zometa at next visit.   # HTN- poorly controlled; at home 150s.  Continue Norvasc to 10 mg a dayContinue lisinopril.  # CKD- stage III-  STABLE.   # Depression- STABLE.   # DISPOSITION:   # follow up with MD/labs- cbc/cmp/in 5 weeks a27-29; Faslodex- Dr.B      Follow-up instructions:  I discussed the assessment and treatment plan with the patient.  The patient was provided an opportunity to ask questions and all were answered.  The patient agreed with the plan and demonstrated understanding of instructions.  The patient was advised to call back or seek an in person evaluation if the symptoms worsen or if the condition fails to improve as anticipated.  I provided 12 minutes of non face-to-face telephone visit time during this encounter, and > 50% was spent counseling as documented  under my assessment & plan.   Dr. Charlaine Dalton [facility] at [facility] 04/16/2019 12:20 PM

## 2019-04-16 NOTE — Assessment & Plan Note (Signed)
#  Left breast ipsilateral chest wall recurrence stage IV [2015]; currently on Faslodex plus abema 100mg /day. April 2020 CT scan shows-stable mediastinal adenopathy/right pleural disease.  Tumor marker overall stable in 60s.  #Continue Faslodex+ abema.  Discussed that other options include chemotherapy which I would not recommend at this time/given COVID infection.  #Solitary bone lesion-asymptomatic.  Monitor for now; will discuss Zometa at next visit.   # HTN- poorly controlled; at home 150s.  Continue Norvasc to 10 mg a dayContinue lisinopril.  # CKD- stage III-  STABLE.   # Depression- STABLE.   # DISPOSITION:   # follow up with MD/labs- cbc/cmp/in 5 weeks a27-29; Faslodex- Dr.B

## 2019-04-17 ENCOUNTER — Other Ambulatory Visit: Payer: Self-pay

## 2019-04-17 DIAGNOSIS — C50812 Malignant neoplasm of overlapping sites of left female breast: Secondary | ICD-10-CM

## 2019-04-17 DIAGNOSIS — F329 Major depressive disorder, single episode, unspecified: Secondary | ICD-10-CM | POA: Diagnosis not present

## 2019-04-17 DIAGNOSIS — Z170 Estrogen receptor positive status [ER+]: Secondary | ICD-10-CM | POA: Diagnosis not present

## 2019-04-17 DIAGNOSIS — N183 Chronic kidney disease, stage 3 (moderate): Secondary | ICD-10-CM | POA: Diagnosis not present

## 2019-04-17 DIAGNOSIS — I10 Essential (primary) hypertension: Secondary | ICD-10-CM | POA: Diagnosis not present

## 2019-04-17 LAB — URINALYSIS, COMPLETE (UACMP) WITH MICROSCOPIC
Bilirubin Urine: NEGATIVE
Glucose, UA: NEGATIVE mg/dL
Hgb urine dipstick: NEGATIVE
Ketones, ur: NEGATIVE mg/dL
Nitrite: NEGATIVE
Protein, ur: NEGATIVE mg/dL
Specific Gravity, Urine: 1.017 (ref 1.005–1.030)
WBC, UA: NONE SEEN WBC/hpf (ref 0–5)
pH: 5 (ref 5.0–8.0)

## 2019-04-17 LAB — CANCER ANTIGEN 27.29: CA 27.29: 64 U/mL — ABNORMAL HIGH (ref 0.0–38.6)

## 2019-05-03 ENCOUNTER — Other Ambulatory Visit: Payer: Self-pay | Admitting: Internal Medicine

## 2019-05-03 DIAGNOSIS — C50812 Malignant neoplasm of overlapping sites of left female breast: Secondary | ICD-10-CM

## 2019-05-03 DIAGNOSIS — Z170 Estrogen receptor positive status [ER+]: Secondary | ICD-10-CM

## 2019-05-08 ENCOUNTER — Other Ambulatory Visit: Payer: Self-pay | Admitting: Internal Medicine

## 2019-05-21 ENCOUNTER — Other Ambulatory Visit: Payer: Medicare HMO

## 2019-05-21 ENCOUNTER — Ambulatory Visit: Payer: Medicare HMO

## 2019-05-21 ENCOUNTER — Ambulatory Visit: Payer: Medicare HMO | Admitting: Internal Medicine

## 2019-05-26 ENCOUNTER — Other Ambulatory Visit: Payer: Self-pay | Admitting: *Deleted

## 2019-05-26 DIAGNOSIS — C50812 Malignant neoplasm of overlapping sites of left female breast: Secondary | ICD-10-CM

## 2019-05-27 ENCOUNTER — Other Ambulatory Visit: Payer: Self-pay

## 2019-05-28 ENCOUNTER — Other Ambulatory Visit: Payer: Self-pay

## 2019-05-28 ENCOUNTER — Inpatient Hospital Stay: Payer: Medicare HMO | Attending: Internal Medicine

## 2019-05-28 ENCOUNTER — Other Ambulatory Visit: Payer: Self-pay | Admitting: Pharmacist

## 2019-05-28 ENCOUNTER — Inpatient Hospital Stay: Payer: Medicare HMO

## 2019-05-28 ENCOUNTER — Inpatient Hospital Stay (HOSPITAL_BASED_OUTPATIENT_CLINIC_OR_DEPARTMENT_OTHER): Payer: Medicare HMO | Admitting: Internal Medicine

## 2019-05-28 VITALS — BP 157/82 | HR 84 | Temp 99.7°F | Resp 20 | Ht 66.0 in | Wt 156.4 lb

## 2019-05-28 DIAGNOSIS — C50812 Malignant neoplasm of overlapping sites of left female breast: Secondary | ICD-10-CM | POA: Diagnosis not present

## 2019-05-28 DIAGNOSIS — Z5111 Encounter for antineoplastic chemotherapy: Secondary | ICD-10-CM | POA: Diagnosis not present

## 2019-05-28 DIAGNOSIS — F329 Major depressive disorder, single episode, unspecified: Secondary | ICD-10-CM

## 2019-05-28 DIAGNOSIS — Z79899 Other long term (current) drug therapy: Secondary | ICD-10-CM

## 2019-05-28 DIAGNOSIS — M899 Disorder of bone, unspecified: Secondary | ICD-10-CM | POA: Diagnosis not present

## 2019-05-28 DIAGNOSIS — Z170 Estrogen receptor positive status [ER+]: Secondary | ICD-10-CM | POA: Diagnosis not present

## 2019-05-28 DIAGNOSIS — C50912 Malignant neoplasm of unspecified site of left female breast: Secondary | ICD-10-CM

## 2019-05-28 DIAGNOSIS — F17210 Nicotine dependence, cigarettes, uncomplicated: Secondary | ICD-10-CM | POA: Diagnosis not present

## 2019-05-28 DIAGNOSIS — N183 Chronic kidney disease, stage 3 (moderate): Secondary | ICD-10-CM

## 2019-05-28 DIAGNOSIS — M25512 Pain in left shoulder: Secondary | ICD-10-CM | POA: Diagnosis not present

## 2019-05-28 LAB — COMPREHENSIVE METABOLIC PANEL
ALT: 14 U/L (ref 0–44)
AST: 23 U/L (ref 15–41)
Albumin: 4 g/dL (ref 3.5–5.0)
Alkaline Phosphatase: 65 U/L (ref 38–126)
Anion gap: 10 (ref 5–15)
BUN: 18 mg/dL (ref 8–23)
CO2: 24 mmol/L (ref 22–32)
Calcium: 8.9 mg/dL (ref 8.9–10.3)
Chloride: 105 mmol/L (ref 98–111)
Creatinine, Ser: 1.35 mg/dL — ABNORMAL HIGH (ref 0.44–1.00)
GFR calc Af Amer: 44 mL/min — ABNORMAL LOW (ref >60)
GFR calc non Af Amer: 38 mL/min — ABNORMAL LOW (ref >60)
Glucose, Bld: 204 mg/dL — ABNORMAL HIGH (ref 70–99)
Potassium: 3.6 mmol/L (ref 3.5–5.1)
Sodium: 139 mmol/L (ref 135–145)
Total Bilirubin: 0.5 mg/dL (ref 0.3–1.2)
Total Protein: 8 g/dL (ref 6.5–8.1)

## 2019-05-28 LAB — CBC WITH DIFFERENTIAL/PLATELET
Abs Immature Granulocytes: 0.01 10*3/uL (ref 0.00–0.07)
Basophils Absolute: 0.1 10*3/uL (ref 0.0–0.1)
Basophils Relative: 1 %
Eosinophils Absolute: 0.2 10*3/uL (ref 0.0–0.5)
Eosinophils Relative: 3 %
HCT: 35.3 % — ABNORMAL LOW (ref 36.0–46.0)
Hemoglobin: 11.3 g/dL — ABNORMAL LOW (ref 12.0–15.0)
Immature Granulocytes: 0 %
Lymphocytes Relative: 40 %
Lymphs Abs: 2.5 10*3/uL (ref 0.7–4.0)
MCH: 30.2 pg (ref 26.0–34.0)
MCHC: 32 g/dL (ref 30.0–36.0)
MCV: 94.4 fL (ref 80.0–100.0)
Monocytes Absolute: 0.6 10*3/uL (ref 0.1–1.0)
Monocytes Relative: 9 %
Neutro Abs: 3 10*3/uL (ref 1.7–7.7)
Neutrophils Relative %: 47 %
Platelets: 230 10*3/uL (ref 150–400)
RBC: 3.74 MIL/uL — ABNORMAL LOW (ref 3.87–5.11)
RDW: 14.4 % (ref 11.5–15.5)
WBC: 6.3 10*3/uL (ref 4.0–10.5)
nRBC: 0 % (ref 0.0–0.2)

## 2019-05-28 MED ORDER — FULVESTRANT 250 MG/5ML IM SOLN
500.0000 mg | Freq: Once | INTRAMUSCULAR | Status: AC
  Administered 2019-05-28: 500 mg via INTRAMUSCULAR
  Filled 2019-05-28: qty 10

## 2019-05-28 MED ORDER — ABEMACICLIB 50 MG PO TABS: 50.0000 mg | ORAL_TABLET | Freq: Two times a day (BID) | ORAL | 0 refills | Status: DC

## 2019-05-28 MED FILL — VERZENIO 50 MG TABS: 50 | 28 days supply | Qty: 56 | Fill #0

## 2019-05-28 NOTE — Assessment & Plan Note (Signed)
#  Left breast ipsilateral chest wall recurrence stage IV [2015]; currently on Faslodex plus abema 100mg /day. April 2020 CT scan shows-stable mediastinal adenopathy/right pleural disease.  Tumor marker overall stable in 60s.  #Continue Faslodex+ abema 100 mg/day.   #Solitary bone lesion-asymptomatic.  Monitor for now; will discuss Zometa at next visit.   # HTN- poorly controlled; at home 150s.  Continue Norvasc to 10 mg a dayContinue lisinopril.  # CKD- stage III-  Stable.   # Depression- stable.   # Left shoulder pain-new 1week- worse with movement- check shoulder; not helped with tylenol/bengay x-ray.   # DISPOSITION:   # shot today # X-ray today # follow up with MD/labs- cbc/cmp/in 4 weeks 27-29; Faslodex; PET prior- Dr.B

## 2019-05-29 LAB — CANCER ANTIGEN 27.29: CA 27.29: 68.3 U/mL — ABNORMAL HIGH (ref 0.0–38.6)

## 2019-06-04 NOTE — Progress Notes (Signed)
[place] OFFICE PROGRESS NOTE  Patient Care Team: [name], MD as PCP - General (Internal Medicine) Bary Castilla, Forest Gleason, MD (General Surgery) Marden Noble, MD (Internal Medicine)  Dr.Charles Mayer Masker; [place] HISTORY:  Oncology History   # 2007- LEFT BREAST CA STAGE II [T2N46m; s/p mastec; Dr.Byrnett] ER-Pos/PR Neg; Her- 2 Neu- NEG; ACx4-Taxol x12;Femara; stopped May 2009-stopped follow up.  #JAN 2015- Post mastectomy Chest wall recurrence [chest wall mass bx-]; ER- 90%; PR- NEG; Her2 Neu- NEG; s/p excision [involving skeletal muscle/adipose tissue; clear margins]; CT July 2017- NED;   # Jan 2020- progression/ hilar- START abema [jan 14th]+ faslodex[jan 13th]  # OSTEOPENIA [BMD- June 2016]  # Kidney cysts  DIAGNOSIS: LEFT BREAST CA  STAGE: IV- NED  ;GOALS: pallaitive  CURRENT/MOST RECENT THERAPY: faslodex+ Abema      Breast cancer [place]asized to skin ([place]   02/05/2014 Initial Diagnosis    Breast cancer metastasized to skin ([facility]     Malignant neoplasm of overlapping sites of left breast in female, estrogen receptor positive ([place]   07/24/2016 Initial Diagnosis    Cancer of overlapping sites of left female breast ([place]    01/06/2019 -  Chemotherapy    The patient had fulvestrant (FASLODEX) injection 500 mg, 500 mg, Intramuscular,  Once, 0 of 4 cycles Dose modification: 500 mg (original dose 500 mg, Cycle 1)  for chemotherapy treatment.       INTERVAL HISTORY:  A very pleasant 78year old African-American female patient with above history of ER/PR positive breast cancer stage IV currently on Faslodex + abema is here for follow-up.  Patient is currently on abema 100 mg once a day.   Patient complains of left shoulder pain over the last few days. Denies any trauma.  Worse with movement.  No radiation.  Has tried using BenGay and Tylenol not resolved.  Denies any significant diarrhea.  Review of Systems   Constitutional: Positive for malaise/fatigue. Negative for chills, diaphoresis, fever and weight loss.  HENT: Negative for nosebleeds and sore throat.   Eyes: Negative for double vision.  Respiratory: Negative for cough, hemoptysis, sputum production, shortness of breath and wheezing.   Cardiovascular: Negative for chest pain, palpitations, orthopnea and leg swelling.  Gastrointestinal: Negative for abdominal pain, blood in stool, constipation, diarrhea, heartburn, melena, nausea and vomiting.  Musculoskeletal: Positive for back pain and joint pain.  Skin: Negative.  Negative for itching and rash.  Neurological: Negative for tingling, focal weakness, weakness and headaches.  Endo/Heme/Allergies: Does not bruise/bleed easily.  Psychiatric/Behavioral: Negative for depression. The patient is not nervous/anxious and does not have insomnia.      PAST MEDICAL HISTORY :  Past Medical History:  Diagnosis Date  . [facility] [place]  . Arthritis   . Cancer ([facility] 2007   diagnosed 01/07 left breast with simple mastectomu & sn bx. The pt had a 3.5cm tumor. Frozen section report on the 2 sn were negative. On permanent sections a 0.582mmicrometastasis was identified. She was not felt to require a complete axillary dissection. She has completed her Tamoxifen therapy and has been released from routine f/u with medical oncology service.  . Diabetes mellitus without complication ([facility] [place]  . Hyperlipidemia   . Hypertension    since age 78. [facility]   Mastectomy site recurrence resected 01/26/2014, ER 90%, PR 0%, HER-2/neu nonamplified.  . Other benign neoplasm of connective and other  soft tissue of trunk, unspecified 2014  . Personal history of colonic polyps   . Personal history of tobacco use, presenting hazards to health   . Special screening for malignant neoplasms, colon   . Unspecified disorder of skin and subcutaneous tissue  02/19/2013   biopsy of skin over the sternum on the right breast showed hypertrophic scar,keloid formation.    PAST SURGICAL HISTORY :   Past Surgical History:  Procedure Laterality Date  . ABDOMINAL HYSTERECTOMY  2004   total  . BREAST BIOPSY Right January 26, 2014   Core biopsy for mild increase breast uptake on PET scan, usual ductal hyperplasia and stromal fibrosis  . BREAST SURGERY Left 2007   mastectomy  . chest wall mass  01/26/14  . CHOLECYSTECTOMY  2007  . COLONOSCOPY W/ POLYPECTOMY  2011   Dr. Brynda Greathouse  . EYE SURGERY Right    cataract surgery  . head surgery  1991  . MASTECTOMY Left 2007  . PORT-A-CATH REMOVAL  2008  . PORTACATH PLACEMENT  2007    FAMILY HISTORY :   Family History  Problem Relation Age of Onset  . Cancer Other        ovarian,breast,colon cancers;relations not listed  . Breast cancer Neg Hx     SOCIAL HISTORY:   Social History   Tobacco Use  . Smoking status: Current Some Day Smoker    Packs/day: 1.00    Years: 50.00    Pack years: 50.00    Types: Cigarettes  . Smokeless tobacco: Never Used  Substance Use Topics  . Alcohol use: No    Alcohol/week: 0.0 standard drinks  . Drug use: No    ALLERGIES:  is allergic to metoprolol.  MEDICATIONS:  Current Outpatient Medications  Medication Sig Dispense Refill  . amLODipine (NORVASC) 10 MG tablet Take 1 tablet (10 mg total) by mouth daily. 90 tablet 1  . atorvastatin (LIPITOR) 20 MG tablet Take 20 mg by mouth daily at 6 PM.     . Calcium Carbonate-Vitamin D 600-200 MG-UNIT CAPS Take 200 capsules by mouth once.    . citalopram (CELEXA) 40 MG tablet TAKE 1 TABLET BY MOUTH EVERY DAY 90 tablet 1  . lisinopril (ZESTRIL) 2.5 MG tablet TAKE 1 TABLET BY MOUTH EVERY DAY 90 tablet 1  . abemaciclib (VERZENIO) 50 MG tablet Take 1 tablet (50 mg total) by mouth 2 (two) times daily. 56 tablet 0   No current facility-administered medications for this visit.     PHYSICAL EXAMINATION: ECOG PERFORMANCE STATUS:  0 - Asymptomatic  BP (!) 157/82   Pulse 84   Temp 99.7 F (37.6 C) (Tympanic)   Resp 20   Ht _0  (1.676 m)   Wt 156 lb 6.4 oz (70.9 kg)   BMI 25.24 kg/m   Filed Weights   05/28/19 1330  Weight: 156 lb 6.4 oz (70.9 kg)    Physical Exam  Constitutional: She is oriented to person, place, and time and well-developed, well-nourished, and in no distress.  She is alone.  She is walking by self.  HENT:  Head: Normocephalic and atraumatic.  Mouth/Throat: Oropharynx is clear and moist. No oropharyngeal exudate.  Eyes: Pupils are equal, round, and reactive to light.  Neck: Normal range of motion. Neck supple.  Cardiovascular: Normal rate and regular rhythm.  Pulmonary/Chest: No respiratory distress. She has no wheezes.  Abdominal: Soft. Bowel sounds are normal. She exhibits no distension and no mass. There is no abdominal tenderness. There is no rebound  and no guarding.  Musculoskeletal: Normal range of motion.        General: No tenderness or edema.  Neurological: She is alert and oriented to person, place, and time.  Skin: Skin is warm.  Psychiatric: Affect normal.     LABORATORY DATA:  I have reviewed the data as listed    Component Value Date/Time   NA 139 05/28/2019 1301   NA 139 05/30/2014 0127   K 3.6 05/28/2019 1301   K 3.7 05/30/2014 0127   CL 105 05/28/2019 1301   CL 106 05/30/2014 0127   CO2 24 05/28/2019 1301   CO2 25 05/30/2014 0127   GLUCOSE 204 (H) 05/28/2019 1301   GLUCOSE 136 (H) 05/30/2014 0127   BUN 18 05/28/2019 1301   BUN 19 (H) 05/30/2014 0127   CREATININE 1.35 (H) 05/28/2019 1301   CREATININE 0.98 05/30/2014 0127   CALCIUM 8.9 05/28/2019 1301   CALCIUM 9.0 05/30/2014 0127   PROT 8.0 05/28/2019 1301   PROT 7.8 05/08/2014 1131   ALBUMIN 4.0 05/28/2019 1301   ALBUMIN 3.7 05/08/2014 1131   AST 23 05/28/2019 1301   AST 20 05/08/2014 1131   ALT 14 05/28/2019 1301   ALT 18 05/08/2014 1131   ALKPHOS 65 05/28/2019 1301   ALKPHOS 58 05/08/2014 1131    BILITOT 0.5 05/28/2019 1301   BILITOT 0.4 05/08/2014 1131   GFRNONAA 38 (L) 05/28/2019 1301   GFRNONAA 58 (L) 05/30/2014 0127   GFRAA 44 (L) 05/28/2019 1301   GFRAA >60 05/30/2014 0127    No results found for: SPEP, UPEP  Lab Results  Component Value Date   WBC 6.3 05/28/2019   NEUTROABS 3.0 05/28/2019   HGB 11.3 (L) 05/28/2019   HCT 35.3 (L) 05/28/2019   MCV 94.4 05/28/2019   PLT 230 05/28/2019      Chemistry      Component Value Date/Time   NA 139 05/28/2019 1301   NA 139 05/30/2014 0127   K 3.6 05/28/2019 1301   K 3.7 05/30/2014 0127   CL 105 05/28/2019 1301   CL 106 05/30/2014 0127   CO2 24 05/28/2019 1301   CO2 25 05/30/2014 0127   BUN 18 05/28/2019 1301   BUN 19 (H) 05/30/2014 0127   CREATININE 1.35 (H) 05/28/2019 1301   CREATININE 0.98 05/30/2014 0127      Component Value Date/Time   CALCIUM 8.9 05/28/2019 1301   CALCIUM 9.0 05/30/2014 0127   ALKPHOS 65 05/28/2019 1301   ALKPHOS 58 05/08/2014 1131   AST 23 05/28/2019 1301   AST 20 05/08/2014 1131   ALT 14 05/28/2019 1301   ALT 18 05/08/2014 1131   BILITOT 0.5 05/28/2019 1301   BILITOT 0.4 05/08/2014 1131        RADIOGRAPHIC STUDIES: I have personally reviewed the radiological images as listed and agreed with the findings in the report. No results found.   ASSESSMENT & PLAN:   Malignant neoplasm of overlapping sites of left breast in female, estrogen receptor positive ([place]) #Left breast ipsilateral chest wall recurrence stage IV [2015]; currently on Faslodex plus abema 144m/day. April 2020 CT scan shows-stable mediastinal adenopathy/right pleural disease.  Tumor marker overall stable in 60s.  #Continue Faslodex+ abema 100 mg/day.   #Solitary bone lesion-asymptomatic.  Monitor for now; will discuss Zometa at next visit.   # HTN- poorly controlled; at home 150s.  Continue Norvasc to 10 mg a dayContinue lisinopril.  # CKD- stage III-  Stable.   # Depression- stable.   #  Left shoulder  pain-new 1week- worse with movement- check shoulder; not helped with tylenol/bengay x-ray.   # DISPOSITION:   # shot today # X-ray today # follow up with MD/labs- cbc/cmp/in 4 weeks 27-29; Faslodex; PET prior- Dr.B     [name], MD 06/04/2019 1:45 PM

## 2019-06-09 ENCOUNTER — Other Ambulatory Visit: Payer: Self-pay

## 2019-06-09 ENCOUNTER — Ambulatory Visit
Admission: RE | Admit: 2019-06-09 | Discharge: 2019-06-09 | Disposition: A | Discharge status: Home / Self Care | Payer: Medicare HMO | Source: Ambulatory Visit | Attending: Internal Medicine | Admitting: Internal Medicine

## 2019-06-09 DIAGNOSIS — I700 Atherosclerosis of aorta: Secondary | ICD-10-CM | POA: Insufficient documentation

## 2019-06-09 DIAGNOSIS — C7951 Secondary malignant neoplasm of bone: Secondary | ICD-10-CM | POA: Insufficient documentation

## 2019-06-09 DIAGNOSIS — Z170 Estrogen receptor positive status [ER+]: Secondary | ICD-10-CM | POA: Diagnosis not present

## 2019-06-09 DIAGNOSIS — C50812 Malignant neoplasm of overlapping sites of left female breast: Secondary | ICD-10-CM | POA: Diagnosis not present

## 2019-06-09 DIAGNOSIS — Z9221 Personal history of antineoplastic chemotherapy: Secondary | ICD-10-CM | POA: Diagnosis not present

## 2019-06-09 DIAGNOSIS — M47817 Spondylosis without myelopathy or radiculopathy, lumbosacral region: Secondary | ICD-10-CM | POA: Diagnosis not present

## 2019-06-09 DIAGNOSIS — J439 Emphysema, unspecified: Secondary | ICD-10-CM | POA: Diagnosis not present

## 2019-06-09 DIAGNOSIS — I2510 Atherosclerotic heart disease of native coronary artery without angina pectoris: Secondary | ICD-10-CM | POA: Insufficient documentation

## 2019-06-09 DIAGNOSIS — Z853 Personal history of malignant neoplasm of breast: Secondary | ICD-10-CM | POA: Diagnosis not present

## 2019-06-09 DIAGNOSIS — Z9012 Acquired absence of left breast and nipple: Secondary | ICD-10-CM | POA: Insufficient documentation

## 2019-06-09 DIAGNOSIS — Z85828 Personal history of other malignant neoplasm of skin: Secondary | ICD-10-CM | POA: Diagnosis not present

## 2019-06-09 LAB — GLUCOSE, CAPILLARY: Glucose-Capillary: 126 mg/dL — ABNORMAL HIGH (ref 70–99)

## 2019-06-09 MED ORDER — FLUDEOXYGLUCOSE F - 18 (FDG) INJECTION
8.1000 | Freq: Once | INTRAVENOUS | Status: AC | PRN
  Administered 2019-06-09: 8.3 via INTRAVENOUS

## 2019-06-13 ENCOUNTER — Telehealth: Payer: Self-pay | Admitting: *Deleted

## 2019-06-13 NOTE — Telephone Encounter (Signed)
Daughter called asking for results of PET/CT done Tuesday  IMPRESSION: 1. Response to therapy of nodal and probable pleuroparenchymal disease in the right hemithorax. No new soft tissue metastasis. 2. Metabolic response to therapy of bilateral iliac osseous metastasis. 3. Aortic atherosclerosis (ICD10-I70.0), coronary artery atherosclerosis and emphysema (ICD10-J43.9). 4. Left mastectomy.   Electronically Signed   By: Abigail Miyamoto M.D.   On: 06/09/2019 15:52

## 2019-06-13 NOTE — Telephone Encounter (Signed)
Jessica Compton- please inform patient's daughter-the PET scan shows improvement of cancer.  Continue current therapy.  And I will call the daughter towards the evening.

## 2019-06-13 NOTE — Telephone Encounter (Signed)
I spoke with Jessica Compton and informed her of Md response, She will await his call later today

## 2019-06-20 ENCOUNTER — Other Ambulatory Visit: Payer: Self-pay

## 2019-06-20 ENCOUNTER — Telehealth: Payer: Self-pay | Admitting: *Deleted

## 2019-06-20 ENCOUNTER — Other Ambulatory Visit: Payer: Self-pay | Admitting: Internal Medicine

## 2019-06-20 ENCOUNTER — Inpatient Hospital Stay: Payer: Medicare HMO

## 2019-06-20 DIAGNOSIS — C50912 Malignant neoplasm of unspecified site of left female breast: Secondary | ICD-10-CM

## 2019-06-20 DIAGNOSIS — R350 Frequency of micturition: Secondary | ICD-10-CM

## 2019-06-20 DIAGNOSIS — C50812 Malignant neoplasm of overlapping sites of left female breast: Secondary | ICD-10-CM | POA: Diagnosis not present

## 2019-06-20 DIAGNOSIS — C792 Secondary malignant neoplasm of skin: Secondary | ICD-10-CM

## 2019-06-20 DIAGNOSIS — Z5111 Encounter for antineoplastic chemotherapy: Secondary | ICD-10-CM | POA: Diagnosis not present

## 2019-06-20 LAB — URINALYSIS, COMPLETE (UACMP) WITH MICROSCOPIC
Bilirubin Urine: NEGATIVE
Glucose, UA: NEGATIVE mg/dL
Ketones, ur: NEGATIVE mg/dL
Nitrite: NEGATIVE
Protein, ur: 30 mg/dL — AB
Specific Gravity, Urine: 1.017 (ref 1.005–1.030)
pH: 5 (ref 5.0–8.0)

## 2019-06-20 MED ORDER — CIPROFLOXACIN HCL 500 MG PO TABS: 500.0000 mg | ORAL_TABLET | Freq: Two times a day (BID) | ORAL | 0 refills | Status: DC

## 2019-06-20 NOTE — Telephone Encounter (Signed)
Per Dr. Jacinto Reap - please have patient collect a ua/culture to r/o UTI

## 2019-06-20 NOTE — Telephone Encounter (Signed)
Jessica Compton- please inform pt's daughter that I will be calling ciprofloxacin 500 mg BID x 5 days. Also, STOP abemaciclib until further directions. Will review at next visit.  Thanks GB

## 2019-06-20 NOTE — Telephone Encounter (Signed)
Daughter called asking for results of urine and if medicine is going to be called in.  Urinalysis, Complete w Microscopic Order: [id number] Status:  Final result  Visible to patient:  No (not released)  Next appt:  06/25/2019 at 01:15 PM in Oncology ([facility])  Dx:  Urinary frequency  Ref Range & Units 11:16  Color, Urine YELLOW YELLOW   APPearance CLEAR CLOUDYAbnormal    Specific Gravity, Urine 1.005 - 1.030 1.017   pH 5.0 - 8.0 5.0   Glucose, UA NEGATIVE mg/dL NEGATIVE   Hgb urine dipstick NEGATIVE SMALLAbnormal    Bilirubin Urine NEGATIVE NEGATIVE   Ketones, ur NEGATIVE mg/dL NEGATIVE   Protein, ur NEGATIVE mg/dL 30Abnormal    Nitrite NEGATIVE NEGATIVE   Leukocytes,Ua NEGATIVE LARGEAbnormal    RBC / HPF 0 - 5 RBC/hpf 0-5   WBC, UA 0 - 5 WBC/hpf 21-50   Bacteria, UA NONE SEEN MANYAbnormal    Squamous Epithelial / LPF 0 - 5 0-5   Mucus  PRESENT   Hyaline Casts, UA  PRESENT   Non Squamous Epithelial NONE SEEN 0-5Abnormal    Comment: Performed at [facility], [address] [place], [place] [address]  Resulting Agency  [facility]      Specimen Collected: 06/20/19 11:16  Last Resulted: 06/20/19 13:52

## 2019-06-20 NOTE — Telephone Encounter (Signed)
Daughter informed of results and prescription sent to pharmacy. Also informed to stop Verzenio (abemaciclib) until her next appointment with doctor. She repeated this back to me

## 2019-06-20 NOTE — Telephone Encounter (Signed)
Patient daughter informed and patient will come in this am for lab

## 2019-06-20 NOTE — Telephone Encounter (Signed)
Daughter called asking that medicine be sent to pharmacy for patient as she is urinating small amounts frequently again. Please advise

## 2019-06-22 LAB — URINE CULTURE: Culture: >=100000 — AB

## 2019-06-24 ENCOUNTER — Other Ambulatory Visit: Payer: Self-pay | Admitting: *Deleted

## 2019-06-24 DIAGNOSIS — C50812 Malignant neoplasm of overlapping sites of left female breast: Secondary | ICD-10-CM

## 2019-06-24 DIAGNOSIS — Z170 Estrogen receptor positive status [ER+]: Secondary | ICD-10-CM

## 2019-06-25 ENCOUNTER — Other Ambulatory Visit: Payer: Self-pay | Admitting: *Deleted

## 2019-06-25 ENCOUNTER — Inpatient Hospital Stay (HOSPITAL_BASED_OUTPATIENT_CLINIC_OR_DEPARTMENT_OTHER): Payer: Medicare HMO | Admitting: Internal Medicine

## 2019-06-25 ENCOUNTER — Inpatient Hospital Stay: Payer: Medicare HMO | Attending: Internal Medicine

## 2019-06-25 ENCOUNTER — Inpatient Hospital Stay: Payer: Medicare HMO

## 2019-06-25 ENCOUNTER — Other Ambulatory Visit: Payer: Self-pay

## 2019-06-25 DIAGNOSIS — C50812 Malignant neoplasm of overlapping sites of left female breast: Secondary | ICD-10-CM | POA: Diagnosis not present

## 2019-06-25 DIAGNOSIS — M8580 Other specified disorders of bone density and structure, unspecified site: Secondary | ICD-10-CM | POA: Diagnosis not present

## 2019-06-25 DIAGNOSIS — Z170 Estrogen receptor positive status [ER+]: Secondary | ICD-10-CM | POA: Insufficient documentation

## 2019-06-25 DIAGNOSIS — F17210 Nicotine dependence, cigarettes, uncomplicated: Secondary | ICD-10-CM | POA: Diagnosis not present

## 2019-06-25 DIAGNOSIS — Z79899 Other long term (current) drug therapy: Secondary | ICD-10-CM | POA: Insufficient documentation

## 2019-06-25 DIAGNOSIS — Z9012 Acquired absence of left breast and nipple: Secondary | ICD-10-CM

## 2019-06-25 DIAGNOSIS — N183 Chronic kidney disease, stage 3 (moderate): Secondary | ICD-10-CM

## 2019-06-25 DIAGNOSIS — E1122 Type 2 diabetes mellitus with diabetic chronic kidney disease: Secondary | ICD-10-CM | POA: Diagnosis not present

## 2019-06-25 DIAGNOSIS — I129 Hypertensive chronic kidney disease with stage 1 through stage 4 chronic kidney disease, or unspecified chronic kidney disease: Secondary | ICD-10-CM

## 2019-06-25 DIAGNOSIS — Z5111 Encounter for antineoplastic chemotherapy: Secondary | ICD-10-CM | POA: Diagnosis not present

## 2019-06-25 DIAGNOSIS — N390 Urinary tract infection, site not specified: Secondary | ICD-10-CM | POA: Diagnosis not present

## 2019-06-25 DIAGNOSIS — F329 Major depressive disorder, single episode, unspecified: Secondary | ICD-10-CM | POA: Insufficient documentation

## 2019-06-25 DIAGNOSIS — C792 Secondary malignant neoplasm of skin: Secondary | ICD-10-CM | POA: Diagnosis not present

## 2019-06-25 LAB — COMPREHENSIVE METABOLIC PANEL
ALT: 11 U/L (ref 0–44)
AST: 23 U/L (ref 15–41)
Albumin: 3.9 g/dL (ref 3.5–5.0)
Alkaline Phosphatase: 49 U/L (ref 38–126)
Anion gap: 9 (ref 5–15)
BUN: 20 mg/dL (ref 8–23)
CO2: 24 mmol/L (ref 22–32)
Calcium: 9 mg/dL (ref 8.9–10.3)
Chloride: 104 mmol/L (ref 98–111)
Creatinine, Ser: 1.33 mg/dL — ABNORMAL HIGH (ref 0.44–1.00)
GFR calc Af Amer: 45 mL/min — ABNORMAL LOW (ref >60)
GFR calc non Af Amer: 39 mL/min — ABNORMAL LOW (ref >60)
Glucose, Bld: 171 mg/dL — ABNORMAL HIGH (ref 70–99)
Potassium: 4.2 mmol/L (ref 3.5–5.1)
Sodium: 137 mmol/L (ref 135–145)
Total Bilirubin: 0.6 mg/dL (ref 0.3–1.2)
Total Protein: 7.7 g/dL (ref 6.5–8.1)

## 2019-06-25 LAB — CBC WITH DIFFERENTIAL/PLATELET
Abs Immature Granulocytes: 0.02 10*3/uL (ref 0.00–0.07)
Basophils Absolute: 0 10*3/uL (ref 0.0–0.1)
Basophils Relative: 1 %
Eosinophils Absolute: 0.1 10*3/uL (ref 0.0–0.5)
Eosinophils Relative: 2 %
HCT: 34.5 % — ABNORMAL LOW (ref 36.0–46.0)
Hemoglobin: 11 g/dL — ABNORMAL LOW (ref 12.0–15.0)
Immature Granulocytes: 0 %
Lymphocytes Relative: 36 %
Lymphs Abs: 2.1 10*3/uL (ref 0.7–4.0)
MCH: 29.9 pg (ref 26.0–34.0)
MCHC: 31.9 g/dL (ref 30.0–36.0)
MCV: 93.8 fL (ref 80.0–100.0)
Monocytes Absolute: 0.6 10*3/uL (ref 0.1–1.0)
Monocytes Relative: 10 %
Neutro Abs: 3 10*3/uL (ref 1.7–7.7)
Neutrophils Relative %: 51 %
Platelets: 207 10*3/uL (ref 150–400)
RBC: 3.68 MIL/uL — ABNORMAL LOW (ref 3.87–5.11)
RDW: 13.5 % (ref 11.5–15.5)
WBC: 5.9 10*3/uL (ref 4.0–10.5)
nRBC: 0 % (ref 0.0–0.2)

## 2019-06-25 MED ORDER — FULVESTRANT 250 MG/5ML IM SOLN
500.0000 mg | Freq: Once | INTRAMUSCULAR | Status: AC
  Administered 2019-06-25: 15:00:00 500 mg via INTRAMUSCULAR
  Filled 2019-06-25: qty 10

## 2019-06-25 NOTE — Assessment & Plan Note (Addendum)
#  Left breast ipsilateral chest wall recurrence stage IV [2015]; currently on Faslodex plus abema.  June 2020 PET scan-improved mediastinal adenopathy/right pleural disease.  Tumor marker overall stable in 60s.  # Continue Faslodex+ abema 50mg  BID. Re-start Abema today.  # UTI s/p cipro-improved.  #Solitary bone lesion-asymptomatic.  Improved;  will discuss Zometa at next visit.   # HTN- poorly controlled; at home 150s.  Continue Norvasc to 10 mg a dayContinue lisinopril.  # CKD- stage III-  Stable.   # Depression- stable.   # Left shoulder pain-improved.  Discussed with the patient's daughter, Helene Kelp regarding above plan.  Agrees.  # DISPOSITION:   # shot today # follow up with MD/labs- cbc/cmp/in 4 weeks 27-29; Faslodex; Dr.B

## 2019-06-25 NOTE — Progress Notes (Signed)
[place] OFFICE PROGRESS NOTE  Patient Care Team: [name], MD as PCP - General (Internal Medicine) Bary Castilla, Forest Gleason, MD (General Surgery) Marden Noble, MD (Internal Medicine)  Dr.Charles Mayer Masker; [place]  SUMMARY OF ONCOLOGIC HISTORY:  Oncology History Overview Note  # 2007- LEFT BREAST CA STAGE II [T2N37m; s/p mastec; Dr.Byrnett] ER-Pos/PR Neg; Her- 2 Neu- NEG; ACx4-Taxol x12;Femara; stopped May 2009-stopped follow up.  #JAN 2015- Post mastectomy Chest wall recurrence [chest wall mass bx-]; ER- 90%; PR- NEG; Her2 Neu- NEG; s/p excision [involving skeletal muscle/adipose tissue; clear margins]; CT July 2017- NED;   # Jan 2020- progression/ hilar- START abema [jan 14th]+ faslodex[jan 13th]  # OSTEOPENIA [BMD- June 2016]  # Kidney cysts  DIAGNOSIS: LEFT BREAST CA  STAGE: IV- NED  ;GOALS: pallaitive  CURRENT/MOST RECENT THERAPY: faslodex+ Abema    Breast cancer [place]asized to skin ([facility] [place]  02/05/2014 Initial Diagnosis   [place] ([place]   Malignant neoplasm of overlapping sites of left breast in female, estrogen receptor positive ([place]  07/24/2016 Initial Diagnosis   Cancer of overlapping sites of left female breast ([facility]   01/06/2019 -  Chemotherapy   The patient had fulvestrant (FASLODEX) injection 500 mg, 500 mg, Intramuscular,  Once, 0 of 4 cycles Dose modification: 500 mg (original dose 500 mg, Cycle 1)  for chemotherapy treatment.       INTERVAL HISTORY:  A very pleasant 78year old African-American female patient with above history of ER/PR positive breast cancer stage IV currently on Faslodex + abema is here for follow-up.  Patient was recently diagnosed with Klebsiella.  Patient was treated with ciprofloxacin.  Patient symptoms of urinary urgency discomfort improved.  Patient's abemaciclib was also held because of the ongoing UTI.  Patient's UTI symptoms are significant improved.  Denies any nausea  vomiting but denies any fevers or chills.  Left shoulder pain improved.  Review of Systems  Constitutional: Positive for malaise/fatigue. Negative for chills, diaphoresis, fever and weight loss.  HENT: Negative for nosebleeds and sore throat.   Eyes: Negative for double vision.  Respiratory: Negative for cough, hemoptysis, sputum production, shortness of breath and wheezing.   Cardiovascular: Negative for chest pain, palpitations, orthopnea and leg swelling.  Gastrointestinal: Negative for abdominal pain, blood in stool, constipation, diarrhea, heartburn, melena, nausea and vomiting.  Musculoskeletal: Positive for back pain and joint pain.  Skin: Negative.  Negative for itching and rash.  Neurological: Negative for tingling, focal weakness, weakness and headaches.  Endo/Heme/Allergies: Does not bruise/bleed easily.  Psychiatric/Behavioral: Negative for depression. The patient is not nervous/anxious and does not have insomnia.      PAST MEDICAL HISTORY :  Past Medical History:  Diagnosis Date  . Aneurysm ([place] 2007  . Arthritis   . [facility] ([facility]   diagnosed 01/07 left breast with simple mastectomu & sn bx. The pt had a 3.5cm tumor. Frozen section report on the 2 sn were negative. On permanent sections a 0.575mmicrometastasis was identified. She was not felt to require a complete axillary dissection. She has completed her Tamoxifen therapy and has been released from routine f/u with medical oncology service.  . Diabetes mellitus without complication ([facility] [place]  . Hyperlipidemia   . Hypertension    since age 78. [facility]   Mastectomy site recurrence resected 01/26/2014, ER 90%, PR 0%, HER-2/neu nonamplified.  . Other benign neoplasm of connective and other soft tissue  of trunk, unspecified 2014  . Personal history of colonic polyps   . Personal history of tobacco use, presenting hazards to health   . Special  screening for malignant neoplasms, colon   . Unspecified disorder of skin and subcutaneous tissue 02/19/2013   biopsy of skin over the sternum on the right breast showed hypertrophic scar,keloid formation.    PAST SURGICAL HISTORY :   Past Surgical History:  Procedure Laterality Date  . ABDOMINAL HYSTERECTOMY  2004   total  . BREAST BIOPSY Right January 26, 2014   Core biopsy for mild increase breast uptake on PET scan, usual ductal hyperplasia and stromal fibrosis  . BREAST SURGERY Left 2007   mastectomy  . chest wall mass  01/26/14  . CHOLECYSTECTOMY  2007  . COLONOSCOPY W/ POLYPECTOMY  2011   Dr. Brynda Greathouse  . EYE SURGERY Right    cataract surgery  . head surgery  1991  . MASTECTOMY Left 2007  . PORT-A-CATH REMOVAL  2008  . PORTACATH PLACEMENT  2007    FAMILY HISTORY :   Family History  Problem Relation Age of Onset  . Cancer Other        ovarian,breast,colon cancers;relations not listed  . Breast cancer Neg Hx     SOCIAL HISTORY:   Social History   Tobacco Use  . Smoking status: Current Some Day Smoker    Packs/day: 1.00    Years: 50.00    Pack years: 50.00    Types: Cigarettes  . Smokeless tobacco: Never Used  Substance Use Topics  . Alcohol use: No    Alcohol/week: 0.0 standard drinks  . Drug use: No    ALLERGIES:  is allergic to metoprolol.  MEDICATIONS:  Current Outpatient Medications  Medication Sig Dispense Refill  . amLODipine (NORVASC) 10 MG tablet Take 1 tablet (10 mg total) by mouth daily. 90 tablet 1  . atorvastatin (LIPITOR) 20 MG tablet Take 20 mg by mouth daily at 6 PM.     . Calcium Carbonate-Vitamin D 600-200 MG-UNIT CAPS Take 200 capsules by mouth once.    . citalopram (CELEXA) 40 MG tablet TAKE 1 TABLET BY MOUTH EVERY DAY 90 tablet 1  . lisinopril (ZESTRIL) 2.5 MG tablet TAKE 1 TABLET BY MOUTH EVERY DAY 90 tablet 1  . VERZENIO 50 MG tablet TAKE 1 TABLET (50 MG TOTAL) BY MOUTH 2 (TWO) TIMES DAILY. (Patient not taking: Reported on 06/25/2019) 56  tablet 0   No current facility-administered medications for this visit.    Facility-Administered Medications Ordered in Other Visits  Medication Dose Route Frequency Provider Last Rate Last Dose  . fulvestrant (FASLODEX) injection 500 mg  500 mg Intramuscular Once [name], MD        PHYSICAL EXAMINATION: ECOG PERFORMANCE STATUS: 0 - Asymptomatic  BP 138/76   Pulse 69   Temp 98.1 F (36.7 C)   Resp 16   Wt 154 lb 9.6 oz (70.1 kg)   BMI 24.95 kg/m   Filed Weights   06/25/19 1331  Weight: 154 lb 9.6 oz (70.1 kg)    Physical Exam  Constitutional: She is oriented to person, place, and time and well-developed, well-nourished, and in no distress.  She is alone.  She is walking by self.  HENT:  Head: Normocephalic and atraumatic.  Mouth/Throat: Oropharynx is clear and moist. No oropharyngeal exudate.  Eyes: Pupils are equal, round, and reactive to light.  Neck: Normal range of motion. Neck supple.  Cardiovascular: Normal rate and regular rhythm.  Pulmonary/Chest: No respiratory distress. She has no wheezes.  Abdominal: Soft. Bowel sounds are normal. She exhibits no distension and no mass. There is no abdominal tenderness. There is no rebound and no guarding.  Musculoskeletal: Normal range of motion.        General: No tenderness or edema.  Neurological: She is alert and oriented to person, place, and time.  Skin: Skin is warm.  Psychiatric: Affect normal.     LABORATORY DATA:  I have reviewed the data as listed    Component Value Date/Time   NA 137 06/25/2019 1302   NA 139 05/30/2014 0127   K 4.2 06/25/2019 1302   K 3.7 05/30/2014 0127   CL 104 06/25/2019 1302   CL 106 05/30/2014 0127   CO2 24 06/25/2019 1302   CO2 25 05/30/2014 0127   GLUCOSE 171 (H) 06/25/2019 1302   GLUCOSE 136 (H) 05/30/2014 0127   BUN 20 06/25/2019 1302   BUN 19 (H) 05/30/2014 0127   CREATININE 1.33 (H) 06/25/2019 1302   CREATININE 0.98 05/30/2014 0127   CALCIUM 9.0 06/25/2019  1302   CALCIUM 9.0 05/30/2014 0127   PROT 7.7 06/25/2019 1302   PROT 7.8 05/08/2014 1131   ALBUMIN 3.9 06/25/2019 1302   ALBUMIN 3.7 05/08/2014 1131   AST 23 06/25/2019 1302   AST 20 05/08/2014 1131   ALT 11 06/25/2019 1302   ALT 18 05/08/2014 1131   ALKPHOS 49 06/25/2019 1302   ALKPHOS 58 05/08/2014 1131   BILITOT 0.6 06/25/2019 1302   BILITOT 0.4 05/08/2014 1131   GFRNONAA 39 (L) 06/25/2019 1302   GFRNONAA 58 (L) 05/30/2014 0127   GFRAA 45 (L) 06/25/2019 1302   GFRAA >60 05/30/2014 0127    No results found for: SPEP, UPEP  Lab Results  Component Value Date   WBC 5.9 06/25/2019   NEUTROABS 3.0 06/25/2019   HGB 11.0 (L) 06/25/2019   HCT 34.5 (L) 06/25/2019   MCV 93.8 06/25/2019   PLT 207 06/25/2019      Chemistry      Component Value Date/Time   NA 137 06/25/2019 1302   NA 139 05/30/2014 0127   K 4.2 06/25/2019 1302   K 3.7 05/30/2014 0127   CL 104 06/25/2019 1302   CL 106 05/30/2014 0127   CO2 24 06/25/2019 1302   CO2 25 05/30/2014 0127   BUN 20 06/25/2019 1302   BUN 19 (H) 05/30/2014 0127   CREATININE 1.33 (H) 06/25/2019 1302   CREATININE 0.98 05/30/2014 0127      Component Value Date/Time   CALCIUM 9.0 06/25/2019 1302   CALCIUM 9.0 05/30/2014 0127   ALKPHOS 49 06/25/2019 1302   ALKPHOS 58 05/08/2014 1131   AST 23 06/25/2019 1302   AST 20 05/08/2014 1131   ALT 11 06/25/2019 1302   ALT 18 05/08/2014 1131   BILITOT 0.6 06/25/2019 1302   BILITOT 0.4 05/08/2014 1131        RADIOGRAPHIC STUDIES: I have personally reviewed the radiological images as listed and agreed with the findings in the report. No results found.   ASSESSMENT & PLAN:   Malignant neoplasm of overlapping sites of left breast in female, estrogen receptor positive ([place]) #Left breast ipsilateral chest wall recurrence stage IV [2015]; currently on Faslodex plus abema.  June 2020 PET scan-improved mediastinal adenopathy/right pleural disease.  Tumor marker overall stable in 60s.  #  Continue Faslodex+ abema 96m BID. Re-start Abema today.  # UTI s/p cipro-improved.  #Solitary bone lesion-asymptomatic.  Improved;  will discuss Zometa at next visit.   # HTN- poorly controlled; at home 150s.  Continue Norvasc to 10 mg a dayContinue lisinopril.  # CKD- stage III-  Stable.   # Depression- stable.   # Left shoulder pain-improved.   # DISPOSITION:   # shot today # follow up with MD/labs- cbc/cmp/in 4 weeks 27-29; Faslodex; Dr.B     [name], MD 06/25/2019 2:36 PM

## 2019-06-25 NOTE — Progress Notes (Signed)
Patient does not offer any problems today.  Had a PET scan on 06/09/19

## 2019-06-26 LAB — CANCER ANTIGEN 27.29: CA 27.29: 52.8 U/mL — ABNORMAL HIGH (ref 0.0–38.6)

## 2019-06-30 MED FILL — VERZENIO 50 MG TABS: 50 | 28 days supply | Qty: 56 | Fill #0

## 2019-07-02 ENCOUNTER — Telehealth: Payer: Self-pay | Admitting: *Deleted

## 2019-07-02 NOTE — Telephone Encounter (Signed)
Daughter called reporting that patient has lost her appetite and is only eating about 20% of her usual small amount of food. She is asking what can be done to stimulate her appetite. Please advise

## 2019-07-02 NOTE — Telephone Encounter (Signed)
Per Dr. Jacinto Reap, patient needs to see Jessica Compton in dietary before appetite stimulator med is considered.

## 2019-07-04 ENCOUNTER — Other Ambulatory Visit: Payer: Self-pay

## 2019-07-07 ENCOUNTER — Inpatient Hospital Stay: Payer: Medicare HMO

## 2019-07-07 NOTE — Progress Notes (Signed)
Nutrition Assessment:  Referral from Dr. Rogue Bussing regarding poor appetite, daughter concerned.  78 year old female with stage IV breast cancer.  Past medical history of DM, HTN, HLD.  Noted recent UTI  Called and spoke with daughter Helene Kelp this pm.  Daughter reports that appetite is getting better at this time.  Reports that for about 2 weeks that appetite was decreased.  Daughter has purchased boost and patient has been drinking some of the shakes if unable to eat a meal.  Daughter reports that she mainly eats 2 meals per day (breakfast and supper).  Usually makes homemade biscuits for breakfast stuffed with variety of foods.  Likes chicken strips, tacos, sweets.  Does not like eggs or yogurt or milk.    Daughter reports that she is off antibiotic at this time.   Medications: calcium vit D  Labs: glucose 171, creatinine 1.33    Anthropometrics:   Height: 66 inches  Weight: 154 lb 9.6 oz 7/1 UBW: Noted 156 lb on 6/3 and 154 lb on 3/25 BMI: 24   Estimated Energy Needs  Kcals: 2100-2450 calories Protein: 105-122 g Fluid: > 2 L  NUTRITION DIAGNOSIS: Inadequate oral intake related to UTI  as evidenced by poor appetite for about 2 weeks   INTERVENTION:  Daughter reports intake is better after completing antibiotics for UTI.   Discussed high calorie oral nutrition supplements Discussed high calorie, high protein foods for patient to eat.   Contact information provided to daughter    MONITORING, EVALUATION, GOAL: Patient will consume adequate calories and protein to prevent further weight loss   NEXT VISIT: daughter to contact RD if appetite decreases   Ismael Treptow B. Zenia Resides, [place], [place] Registered Dietitian [phone] (pager)

## 2019-07-22 ENCOUNTER — Other Ambulatory Visit: Payer: Self-pay

## 2019-07-23 ENCOUNTER — Inpatient Hospital Stay: Payer: Medicare HMO

## 2019-07-23 ENCOUNTER — Inpatient Hospital Stay (HOSPITAL_BASED_OUTPATIENT_CLINIC_OR_DEPARTMENT_OTHER): Payer: Medicare HMO | Admitting: Internal Medicine

## 2019-07-23 ENCOUNTER — Other Ambulatory Visit: Payer: Self-pay

## 2019-07-23 DIAGNOSIS — N183 Chronic kidney disease, stage 3 (moderate): Secondary | ICD-10-CM

## 2019-07-23 DIAGNOSIS — C50812 Malignant neoplasm of overlapping sites of left female breast: Secondary | ICD-10-CM

## 2019-07-23 DIAGNOSIS — Z9012 Acquired absence of left breast and nipple: Secondary | ICD-10-CM | POA: Diagnosis not present

## 2019-07-23 DIAGNOSIS — Z5111 Encounter for antineoplastic chemotherapy: Secondary | ICD-10-CM | POA: Diagnosis not present

## 2019-07-23 DIAGNOSIS — Z170 Estrogen receptor positive status [ER+]: Secondary | ICD-10-CM | POA: Diagnosis not present

## 2019-07-23 DIAGNOSIS — C792 Secondary malignant neoplasm of skin: Secondary | ICD-10-CM | POA: Diagnosis not present

## 2019-07-23 DIAGNOSIS — E1122 Type 2 diabetes mellitus with diabetic chronic kidney disease: Secondary | ICD-10-CM

## 2019-07-23 DIAGNOSIS — M8580 Other specified disorders of bone density and structure, unspecified site: Secondary | ICD-10-CM | POA: Diagnosis not present

## 2019-07-23 DIAGNOSIS — F329 Major depressive disorder, single episode, unspecified: Secondary | ICD-10-CM | POA: Diagnosis not present

## 2019-07-23 DIAGNOSIS — I129 Hypertensive chronic kidney disease with stage 1 through stage 4 chronic kidney disease, or unspecified chronic kidney disease: Secondary | ICD-10-CM

## 2019-07-23 DIAGNOSIS — F17210 Nicotine dependence, cigarettes, uncomplicated: Secondary | ICD-10-CM | POA: Diagnosis not present

## 2019-07-23 DIAGNOSIS — N390 Urinary tract infection, site not specified: Secondary | ICD-10-CM | POA: Diagnosis not present

## 2019-07-23 LAB — CBC WITH DIFFERENTIAL/PLATELET
Abs Immature Granulocytes: 0.01 10*3/uL (ref 0.00–0.07)
Basophils Absolute: 0 10*3/uL (ref 0.0–0.1)
Basophils Relative: 1 %
Eosinophils Absolute: 0.2 10*3/uL (ref 0.0–0.5)
Eosinophils Relative: 4 %
HCT: 33.5 % — ABNORMAL LOW (ref 36.0–46.0)
Hemoglobin: 10.6 g/dL — ABNORMAL LOW (ref 12.0–15.0)
Immature Granulocytes: 0 %
Lymphocytes Relative: 50 %
Lymphs Abs: 2.8 10*3/uL (ref 0.7–4.0)
MCH: 29.6 pg (ref 26.0–34.0)
MCHC: 31.6 g/dL (ref 30.0–36.0)
MCV: 93.6 fL (ref 80.0–100.0)
Monocytes Absolute: 0.5 10*3/uL (ref 0.1–1.0)
Monocytes Relative: 9 %
Neutro Abs: 2 10*3/uL (ref 1.7–7.7)
Neutrophils Relative %: 36 %
Platelets: 183 10*3/uL (ref 150–400)
RBC: 3.58 MIL/uL — ABNORMAL LOW (ref 3.87–5.11)
RDW: 13.2 % (ref 11.5–15.5)
WBC: 5.4 10*3/uL (ref 4.0–10.5)
nRBC: 0 % (ref 0.0–0.2)

## 2019-07-23 LAB — COMPREHENSIVE METABOLIC PANEL
ALT: 12 U/L (ref 0–44)
AST: 18 U/L (ref 15–41)
Albumin: 3.9 g/dL (ref 3.5–5.0)
Alkaline Phosphatase: 52 U/L (ref 38–126)
Anion gap: 8 (ref 5–15)
BUN: 19 mg/dL (ref 8–23)
CO2: 26 mmol/L (ref 22–32)
Calcium: 8.8 mg/dL — ABNORMAL LOW (ref 8.9–10.3)
Chloride: 105 mmol/L (ref 98–111)
Creatinine, Ser: 1.33 mg/dL — ABNORMAL HIGH (ref 0.44–1.00)
GFR calc Af Amer: 45 mL/min — ABNORMAL LOW (ref >60)
GFR calc non Af Amer: 39 mL/min — ABNORMAL LOW (ref >60)
Glucose, Bld: 113 mg/dL — ABNORMAL HIGH (ref 70–99)
Potassium: 4.5 mmol/L (ref 3.5–5.1)
Sodium: 139 mmol/L (ref 135–145)
Total Bilirubin: 0.7 mg/dL (ref 0.3–1.2)
Total Protein: 7.9 g/dL (ref 6.5–8.1)

## 2019-07-23 MED ORDER — FULVESTRANT 250 MG/5ML IM SOLN
500.0000 mg | Freq: Once | INTRAMUSCULAR | Status: AC
  Administered 2019-07-23: 500 mg via INTRAMUSCULAR
  Filled 2019-07-23: qty 10

## 2019-07-23 NOTE — Assessment & Plan Note (Addendum)
#  Left breast ipsilateral chest wall recurrence stage IV [2015]; currently on Faslodex plus abema.  June 2020 PET scan-improved mediastinal adenopathy/right pleural disease.  Tumor marker overall stable in 50s-60s.   # Continue Faslodex+ abema 50mg  BID. On Abema 50 mg BID today.  # Solitary bone lesion-asymptomatic.  Improved; discused re: zometa; discussed regarding hypercalcemia /osteonecrosis of the jaw.  Will need dental clearance.  Hold off for now/COVID.  # HTN- at home 150s.  Continue Norvasc to 10 mg a day Continue lisinopril. Stable.   # CKD- stage III- stable  # Depression- stable  Discussed with the patient's daughter, Helene Kelp regarding above plan.  Agrees.  # DISPOSITION:   # shot today # follow up with MD/labs- cbc/cmp/in 4 weeks 27-29; Faslodex; Dr.B

## 2019-07-23 NOTE — Progress Notes (Signed)
[facility] OFFICE PROGRESS NOTE  Patient Care Team: [name], MD as PCP - General (Internal Medicine) Bary Castilla, Forest Gleason, MD (General Surgery) Marden Noble, MD (Internal Medicine)  Dr.Charles Mayer Masker; [place]  SUMMARY OF ONCOLOGIC HISTORY:  Oncology History Overview Note  # 2007- LEFT BREAST CA STAGE II [T2N66m; s/p mastec; Dr.Byrnett] ER-Pos/PR Neg; Her- 2 Neu- NEG; ACx4-Taxol x12;Femara; stopped May 2009-stopped follow up.  #JAN 2015- Post mastectomy Chest wall recurrence [chest wall mass bx-]; ER- 90%; PR- NEG; Her2 Neu- NEG; s/p excision [involving skeletal muscle/adipose tissue; clear margins]; CT July 2017- NED;   # Jan 2020- progression/ hilar- START abema [jan 14th]+ faslodex[jan 13th]  # OSTEOPENIA [BMD- June 2016]  # Kidney cysts  DIAGNOSIS: LEFT BREAST CA  STAGE: IV- NED  ;GOALS: pallaitive  CURRENT/MOST RECENT THERAPY: faslodex+ Abema    [place] cancer metastasized to skin ([facility]  02/05/2014 Initial Diagnosis   Bre[place] cancer [place]asized to skin ([facility]   Malignant neoplasm of overlapping sites of left breast in female, estrogen receptor positive ([facility]  07/24/2016 Initial Diagnosis   Cancer of overlapping sites of left female breast ([facility]   01/06/2019 -  Chemotherapy   The patient had fulvestrant (FASLODEX) injection 500 mg, 500 mg, Intramuscular,  Once, 0 of 4 cycles Dose modification: 500 mg (original dose 500 mg, Cycle 1)  for chemotherapy treatment.       INTERVAL HISTORY:  A very pleasant 78year old African-American female patient with above history of ER/PR positive breast cancer stage IV currently on Faslodex + abema is here for follow-up.  Patient denies any nausea vomiting abdominal pain.  No repeat episodes of UTI.  Her appetite is improved.  She is gaining weight.  Denies any hip pain.  Review of Systems  Constitutional: Positive for malaise/fatigue. Negative for chills, diaphoresis, fever and weight loss.   HENT: Negative for nosebleeds and sore throat.   Eyes: Negative for double vision.  Respiratory: Negative for cough, hemoptysis, sputum production, shortness of breath and wheezing.   Cardiovascular: Negative for chest pain, palpitations, orthopnea and leg swelling.  Gastrointestinal: Negative for abdominal pain, blood in stool, constipation, diarrhea, heartburn, melena, nausea and vomiting.  Musculoskeletal: Positive for back pain and joint pain.  Skin: Negative.  Negative for itching and rash.  Neurological: Negative for tingling, focal weakness, weakness and headaches.  Endo/Heme/Allergies: Does not bruise/bleed easily.  Psychiatric/Behavioral: Negative for depression. The patient is not nervous/anxious and does not have insomnia.      PAST MEDICAL HISTORY :  Past Medical History:  Diagnosis Date  . Aneurysm ([facility] 2007  . Arthritis   . Cancer ([facility] 2007   diagnosed 01/07 left breast with simple mastectomu & sn bx. The pt had a 3.5cm tumor. Frozen section report on the 2 sn were negative. On permanent sections a 0.543mmicrometastasis was identified. She was not felt to require a complete axillary dissection. She has completed her Tamoxifen therapy and has been released from routine f/u with medical oncology service.  . Diabetes mellitus without complication ([facility]  . Hyperlipidemia   . Hypertension    since age 78. [facility]outer [facility]   Mastectomy site recurrence resected 01/26/2014, ER 90%, PR 0%, HER-2/neu nonamplified.  . Other benign neoplasm of connective and other soft tissue of trunk, unspecified 2014  . Personal history of colonic polyps   . Personal history of tobacco use, presenting hazards to health   .  Special screening for malignant neoplasms, colon   . Unspecified disorder of skin and subcutaneous tissue 02/19/2013   biopsy of skin over the sternum on the right breast showed hypertrophic scar,keloid  formation.    PAST SURGICAL HISTORY :   Past Surgical History:  Procedure Laterality Date  . ABDOMINAL HYSTERECTOMY  2004   total  . BREAST BIOPSY Right January 26, 2014   Core biopsy for mild increase breast uptake on PET scan, usual ductal hyperplasia and stromal fibrosis  . BREAST SURGERY Left 2007   mastectomy  . chest wall mass  01/26/14  . CHOLECYSTECTOMY  2007  . COLONOSCOPY W/ POLYPECTOMY  2011   Dr. Brynda Greathouse  . EYE SURGERY Right    cataract surgery  . head surgery  1991  . MASTECTOMY Left 2007  . PORT-A-CATH REMOVAL  2008  . PORTACATH PLACEMENT  2007    FAMILY HISTORY :   Family History  Problem Relation Age of Onset  . Cancer Other        ovarian,breast,colon cancers;relations not listed  . Breast cancer Neg Hx     SOCIAL HISTORY:   Social History   Tobacco Use  . Smoking status: Current Some Day Smoker    Packs/day: 1.00    Years: 50.00    Pack years: 50.00    Types: Cigarettes  . Smokeless tobacco: Never Used  Substance Use Topics  . Alcohol use: No    Alcohol/week: 0.0 standard drinks  . Drug use: No    ALLERGIES:  is allergic to metoprolol.  MEDICATIONS:  Current Outpatient Medications  Medication Sig Dispense Refill  . amLODipine (NORVASC) 10 MG tablet Take 1 tablet (10 mg total) by mouth daily. 90 tablet 1  . atorvastatin (LIPITOR) 20 MG tablet Take 20 mg by mouth daily at 6 PM.     . Calcium Carbonate-Vitamin D 600-200 MG-UNIT CAPS Take 200 capsules by mouth once.    . citalopram (CELEXA) 40 MG tablet TAKE 1 TABLET BY MOUTH EVERY DAY 90 tablet 1  . lisinopril (ZESTRIL) 2.5 MG tablet TAKE 1 TABLET BY MOUTH EVERY DAY 90 tablet 1  . VERZENIO 50 MG tablet TAKE 1 TABLET (50 MG TOTAL) BY MOUTH 2 (TWO) TIMES DAILY. (Patient not taking: Reported on 06/25/2019) 56 tablet 0   No current facility-administered medications for this visit.     PHYSICAL EXAMINATION: ECOG PERFORMANCE STATUS: 0 - Asymptomatic  BP (!) 152/80   Pulse 74   Temp 97.6 F (36.4  C)   Resp 16   Wt 155 lb 6.4 oz (70.5 kg)   BMI 25.08 kg/m   Filed Weights   07/23/19 1312  Weight: 155 lb 6.4 oz (70.5 kg)    Physical Exam  Constitutional: She is oriented to person, place, and time and well-developed, well-nourished, and in no distress.  She is alone.  She is walking by self.  HENT:  Head: Normocephalic and atraumatic.  Mouth/Throat: Oropharynx is clear and moist. No oropharyngeal exudate.  Eyes: Pupils are equal, round, and reactive to light.  Neck: Normal range of motion. Neck supple.  Cardiovascular: Normal rate and regular rhythm.  Pulmonary/Chest: No respiratory distress. She has no wheezes.  Abdominal: Soft. Bowel sounds are normal. She exhibits no distension and no mass. There is no abdominal tenderness. There is no rebound and no guarding.  Musculoskeletal: Normal range of motion.        General: No tenderness or edema.  Neurological: She is alert and oriented to person, place,  and time.  Skin: Skin is warm.  Psychiatric: Affect normal.     LABORATORY DATA:  I have reviewed the data as listed    Component Value Date/Time   NA 139 07/23/2019 1258   NA 139 05/30/2014 0127   K 4.5 07/23/2019 1258   K 3.7 05/30/2014 0127   CL 105 07/23/2019 1258   CL 106 05/30/2014 0127   CO2 26 07/23/2019 1258   CO2 25 05/30/2014 0127   GLUCOSE 113 (H) 07/23/2019 1258   GLUCOSE 136 (H) 05/30/2014 0127   BUN 19 07/23/2019 1258   BUN 19 (H) 05/30/2014 0127   CREATININE 1.33 (H) 07/23/2019 1258   CREATININE 0.98 05/30/2014 0127   CALCIUM 8.8 (L) 07/23/2019 1258   CALCIUM 9.0 05/30/2014 0127   PROT 7.9 07/23/2019 1258   PROT 7.8 05/08/2014 1131   ALBUMIN 3.9 07/23/2019 1258   ALBUMIN 3.7 05/08/2014 1131   AST 18 07/23/2019 1258   AST 20 05/08/2014 1131   ALT 12 07/23/2019 1258   ALT 18 05/08/2014 1131   ALKPHOS 52 07/23/2019 1258   ALKPHOS 58 05/08/2014 1131   BILITOT 0.7 07/23/2019 1258   BILITOT 0.4 05/08/2014 1131   GFRNONAA 39 (L) 07/23/2019 1258    GFRNONAA 58 (L) 05/30/2014 0127   GFRAA 45 (L) 07/23/2019 1258   GFRAA >60 05/30/2014 0127    No results found for: SPEP, UPEP  Lab Results  Component Value Date   WBC 5.4 07/23/2019   NEUTROABS 2.0 07/23/2019   HGB 10.6 (L) 07/23/2019   HCT 33.5 (L) 07/23/2019   MCV 93.6 07/23/2019   PLT 183 07/23/2019      Chemistry      Component Value Date/Time   NA 139 07/23/2019 1258   NA 139 05/30/2014 0127   K 4.5 07/23/2019 1258   K 3.7 05/30/2014 0127   CL 105 07/23/2019 1258   CL 106 05/30/2014 0127   CO2 26 07/23/2019 1258   CO2 25 05/30/2014 0127   BUN 19 07/23/2019 1258   BUN 19 (H) 05/30/2014 0127   CREATININE 1.33 (H) 07/23/2019 1258   CREATININE 0.98 05/30/2014 0127      Component Value Date/Time   CALCIUM 8.8 (L) 07/23/2019 1258   CALCIUM 9.0 05/30/2014 0127   ALKPHOS 52 07/23/2019 1258   ALKPHOS 58 05/08/2014 1131   AST 18 07/23/2019 1258   AST 20 05/08/2014 1131   ALT 12 07/23/2019 1258   ALT 18 05/08/2014 1131   BILITOT 0.7 07/23/2019 1258   BILITOT 0.4 05/08/2014 1131        RADIOGRAPHIC STUDIES: I have personally reviewed the radiological images as listed and agreed with the findings in the report. No results found.   ASSESSMENT & PLAN:   Malignant neoplasm of overlapping sites of left breast in female, estrogen receptor positive ([place]) #Left breast ipsilateral chest wall recurrence stage IV [2015]; currently on Faslodex plus abema.  June 2020 PET scan-improved mediastinal adenopathy/right pleural disease.  Tumor marker overall stable in 50s-60s.   # Continue Faslodex+ abema 37m BID. On Abema 50 mg BID today.  # Solitary bone lesion-asymptomatic.  Improved; discused re: zometa; discussed regarding hypercalcemia /osteonecrosis of the jaw.  Will need dental clearance.  Hold off for now/COVID.  # HTN- at home 150s.  Continue Norvasc to 10 mg a day Continue lisinopril. Stable.   # CKD- stage III- stable  # Depression- stable  Discussed with  the patient's daughter, THelene Kelpregarding above plan.  Agrees.  #  DISPOSITION:   # shot today # follow up with MD/labs- cbc/cmp/in 4 weeks 27-29; Faslodex; Dr.B     [name], MD 07/23/2019 2:44 PM

## 2019-07-23 NOTE — Progress Notes (Signed)
Patient does not offer any problems today.  

## 2019-07-24 ENCOUNTER — Encounter: Payer: Self-pay | Admitting: General Surgery

## 2019-07-24 ENCOUNTER — Other Ambulatory Visit: Payer: Self-pay | Admitting: Internal Medicine

## 2019-07-24 DIAGNOSIS — C792 Secondary malignant neoplasm of skin: Secondary | ICD-10-CM

## 2019-07-24 DIAGNOSIS — C50912 Malignant neoplasm of unspecified site of left female breast: Secondary | ICD-10-CM

## 2019-07-24 LAB — CANCER ANTIGEN 27.29: CA 27.29: 55 U/mL — ABNORMAL HIGH (ref 0.0–38.6)

## 2019-07-24 NOTE — Telephone Encounter (Signed)
CBC with Differential Order: [id number] Status:  Final result Visible to patient:  Yes (MyChart) Next appt:  08/20/2019 at 12:45 PM in Oncology ([facility]) Dx:  Malignant neoplasm of overlapping sit...  Ref Range & Units 1d ago 4wk ago 64mo ago  WBC 4.0 - 10.5 K/uL 5.4  5.9  6.3   RBC 3.87 - 5.11 MIL/uL 3.58Low   3.68Low   3.74Low    Hemoglobin 12.0 - 15.0 g/dL 10.6Low   11.0Low   11.3Low    HCT 36.0 - 46.0 % 33.5Low   34.5Low   35.3Low    MCV 80.0 - 100.0 fL 93.6  93.8  94.4   MCH 26.0 - 34.0 pg 29.6  29.9  30.2   MCHC 30.0 - 36.0 g/dL 31.6  31.9  32.0   RDW 11.5 - 15.5 % 13.2  13.5  14.4   Platelets 150 - 400 K/uL 183  207  230   nRBC 0.0 - 0.2 % 0.0  0.0  0.0   Neutrophils Relative % % 36  51  47   Neutro Abs 1.7 - 7.7 K/uL 2.0  3.0  3.0   Lymphocytes Relative % 50  36  40   Lymphs Abs 0.7 - 4.0 K/uL 2.8  2.1  2.5   Monocytes Relative % 9  10  9    Monocytes Absolute 0.1 - 1.0 K/uL 0.5  0.6  0.6   Eosinophils Relative % 4  2  3    Eosinophils Absolute 0.0 - 0.5 K/uL 0.2  0.1  0.2   Basophils Relative % 1  1  1    Basophils Absolute 0.0 - 0.1 K/uL 0.0  0.0  0.1   Immature Granulocytes % 0  0  0   Abs Immature Granulocytes 0.00 - 0.07 K/uL 0.01  0.02 CM  0.01 CM   Comment: Performed at [facility], [address] [place], [place] [address]  Resulting Agency  [facility] CLIN LAB [facility] CLIN LAB [facility] CLIN LAB      Specimen Collected: 07/23/19 12:58 Last Resulted: 07/23/19 13:06     Lab Flowsheet   Order Details   View Encounter   Lab and Collection Details   Routing   Result History     CM=Additional comments      Other Results from 07/23/2019  Result Notes for Cancer antigen 27.29  Notes recorded by [name], MD on 07/24/2019 at 1:08 PM EDT  Hello-I wanted to inform you that- Tumor marker is stable/ at 49.   No new recommendations/follow-up as planned/call if any questions.   Thank you/Dr. B/MyChart message  Contains abnormal data Cancer antigen  27.29 Order: [id number]  Status:  Final result Visible to patient:  Yes (MyChart) Next appt:  08/20/2019 at 12:45 PM in Oncology ([facility]) Dx:  Malignant neoplasm of overlapping sit...  Ref Range & Units 1d ago 4wk ago 13mo ago  CA 27.29 0.0 - 38.6 U/mL 55.0High   52.8High  CM  68.3High  CM   Comment: (NOTE)  [employer] Immunochemiluminometric Methodology [facility])  Values obtained with different assay methods or kits cannot be used  interchangeably. Results cannot be interpreted as absolute evidence  of the presence or absence of malignant disease.  Performed At: [facility]  [address] [place], [place] [id number]  Rush Farmer MD ZS:[id number]   Resulting Agency  [facility] CLIN LAB [facility] CLIN LAB [facility] CLIN LAB      Specimen Collected: 07/23/19 12:58 Last Resulted: 07/24/19 03:36  Lab Flowsheet   Order Details   View Encounter   Lab and Collection Details   Routing   Result History     CM=Additional comments        Result Notes for Comprehensive metabolic panel  Notes recorded by [name], MD on 07/24/2019 at 1:08 PM EDT  Hello-I wanted to inform you that- Tumor marker is stable/ at 37.   No new recommendations/follow-up as planned/call if any questions.   Thank you/Dr. B/MyChart message  Contains abnormal data Comprehensive metabolic panel Order: [id number]  Status:  Final result Visible to patient:  Yes (MyChart) Next appt:  08/20/2019 at 12:45 PM in Oncology ([facility]) Dx:  Malignant neoplasm of overlapping sit...  Ref Range & Units 1d ago 4wk ago 94mo ago  Sodium 135 - 145 mmol/L 139  137  139   Potassium 3.5 - 5.1 mmol/L 4.5  4.2  3.6   Chloride 98 - 111 mmol/L 105  104  105   CO2 22 - 32 mmol/L 26  24  24    Glucose, Bld 70 - 99 mg/dL 113High   171High   204High    BUN 8 - 23 mg/dL 19  20  18    Creatinine, Ser 0.44 - 1.00 mg/dL 1.33High   1.33High   1.35High    Calcium 8.9 - 10.3 mg/dL 8.8Low   9.0  8.9   Total Protein 6.5 -  8.1 g/dL 7.9  7.7  8.0   Albumin 3.5 - 5.0 g/dL 3.9  3.9  4.0   AST 15 - 41 U/L 18  23  23    ALT 0 - 44 U/L 12  11  14    Alkaline Phosphatase 38 - 126 U/L 52  49  65   Total Bilirubin 0.3 - 1.2 mg/dL 0.7  0.6  0.5   GFR calc non Af Amer >60 mL/min 39Low   39Low   38Low    GFR calc Af Amer >60 mL/min 45Low   45Low   44Low    Anion gap 5 - 15 8  9  CM  10 CM   Comment: Performed at [facility], [address] [place], [place] [address]  Resulting Agency  [facility] CLIN LAB [facility] CLIN LAB [facility] CLIN LAB      Specimen Collected: 07/23/19 12:58 Last Resulted: 07/23/19 14:00

## 2019-07-28 MED FILL — VERZENIO 50 MG TABS: 50 | 28 days supply | Qty: 56 | Fill #0

## 2019-08-19 ENCOUNTER — Other Ambulatory Visit: Payer: Self-pay

## 2019-08-20 ENCOUNTER — Inpatient Hospital Stay: Payer: Medicare HMO | Attending: Internal Medicine

## 2019-08-20 ENCOUNTER — Other Ambulatory Visit: Payer: Self-pay

## 2019-08-20 ENCOUNTER — Inpatient Hospital Stay: Payer: Medicare HMO

## 2019-08-20 ENCOUNTER — Encounter: Payer: Self-pay | Admitting: Internal Medicine

## 2019-08-20 ENCOUNTER — Inpatient Hospital Stay (HOSPITAL_BASED_OUTPATIENT_CLINIC_OR_DEPARTMENT_OTHER): Payer: Medicare HMO | Admitting: Internal Medicine

## 2019-08-20 DIAGNOSIS — C50812 Malignant neoplasm of overlapping sites of left female breast: Secondary | ICD-10-CM

## 2019-08-20 DIAGNOSIS — C792 Secondary malignant neoplasm of skin: Secondary | ICD-10-CM | POA: Insufficient documentation

## 2019-08-20 DIAGNOSIS — Z170 Estrogen receptor positive status [ER+]: Secondary | ICD-10-CM

## 2019-08-20 DIAGNOSIS — Z79899 Other long term (current) drug therapy: Secondary | ICD-10-CM | POA: Insufficient documentation

## 2019-08-20 DIAGNOSIS — N183 Chronic kidney disease, stage 3 (moderate): Secondary | ICD-10-CM | POA: Diagnosis not present

## 2019-08-20 DIAGNOSIS — F17210 Nicotine dependence, cigarettes, uncomplicated: Secondary | ICD-10-CM | POA: Diagnosis not present

## 2019-08-20 DIAGNOSIS — Z5111 Encounter for antineoplastic chemotherapy: Secondary | ICD-10-CM | POA: Insufficient documentation

## 2019-08-20 DIAGNOSIS — I129 Hypertensive chronic kidney disease with stage 1 through stage 4 chronic kidney disease, or unspecified chronic kidney disease: Secondary | ICD-10-CM | POA: Diagnosis not present

## 2019-08-20 DIAGNOSIS — E1122 Type 2 diabetes mellitus with diabetic chronic kidney disease: Secondary | ICD-10-CM | POA: Insufficient documentation

## 2019-08-20 LAB — COMPREHENSIVE METABOLIC PANEL
ALT: 10 U/L (ref 0–44)
AST: 18 U/L (ref 15–41)
Albumin: 4 g/dL (ref 3.5–5.0)
Alkaline Phosphatase: 55 U/L (ref 38–126)
Anion gap: 9 (ref 5–15)
BUN: 14 mg/dL (ref 8–23)
CO2: 25 mmol/L (ref 22–32)
Calcium: 9.2 mg/dL (ref 8.9–10.3)
Chloride: 107 mmol/L (ref 98–111)
Creatinine, Ser: 1.18 mg/dL — ABNORMAL HIGH (ref 0.44–1.00)
GFR calc Af Amer: 52 mL/min — ABNORMAL LOW (ref >60)
GFR calc non Af Amer: 45 mL/min — ABNORMAL LOW (ref >60)
Glucose, Bld: 117 mg/dL — ABNORMAL HIGH (ref 70–99)
Potassium: 4.3 mmol/L (ref 3.5–5.1)
Sodium: 141 mmol/L (ref 135–145)
Total Bilirubin: 0.9 mg/dL (ref 0.3–1.2)
Total Protein: 7.8 g/dL (ref 6.5–8.1)

## 2019-08-20 LAB — CBC WITH DIFFERENTIAL/PLATELET
Abs Immature Granulocytes: 0.02 10*3/uL (ref 0.00–0.07)
Basophils Absolute: 0 10*3/uL (ref 0.0–0.1)
Basophils Relative: 1 %
Eosinophils Absolute: 0.2 10*3/uL (ref 0.0–0.5)
Eosinophils Relative: 4 %
HCT: 32.1 % — ABNORMAL LOW (ref 36.0–46.0)
Hemoglobin: 10.2 g/dL — ABNORMAL LOW (ref 12.0–15.0)
Immature Granulocytes: 0 %
Lymphocytes Relative: 48 %
Lymphs Abs: 2.4 10*3/uL (ref 0.7–4.0)
MCH: 30.3 pg (ref 26.0–34.0)
MCHC: 31.8 g/dL (ref 30.0–36.0)
MCV: 95.3 fL (ref 80.0–100.0)
Monocytes Absolute: 0.4 10*3/uL (ref 0.1–1.0)
Monocytes Relative: 9 %
Neutro Abs: 1.9 10*3/uL (ref 1.7–7.7)
Neutrophils Relative %: 38 %
Platelets: 211 10*3/uL (ref 150–400)
RBC: 3.37 MIL/uL — ABNORMAL LOW (ref 3.87–5.11)
RDW: 14 % (ref 11.5–15.5)
WBC: 5 10*3/uL (ref 4.0–10.5)
nRBC: 0 % (ref 0.0–0.2)

## 2019-08-20 MED ORDER — FULVESTRANT 250 MG/5ML IM SOLN
500.0000 mg | Freq: Once | INTRAMUSCULAR | Status: AC
  Administered 2019-08-20: 14:00:00 500 mg via INTRAMUSCULAR
  Filled 2019-08-20: qty 10

## 2019-08-20 NOTE — Progress Notes (Signed)
[place] OFFICE PROGRESS NOTE  Patient Care Team: [name], MD as PCP - General (Internal Medicine) Bary Castilla, Forest Gleason, MD (General Surgery) Marden Noble, MD (Internal Medicine)  Dr.Charles Mayer Masker; [place]  SUMMARY OF ONCOLOGIC HISTORY:  Oncology History Overview Note  # 2007- LEFT BREAST CA STAGE II [T2N37m; s/p mastec; Dr.Byrnett] ER-Pos/PR Neg; Her- 2 Neu- NEG; ACx4-Taxol x12;Femara; stopped May 2009-stopped follow up.  #JAN 2015- Post mastectomy Chest wall recurrence [chest wall mass bx-]; ER- 90%; PR- NEG; Her2 Neu- NEG; s/p excision [involving skeletal muscle/adipose tissue; clear margins]; CT July 2017- NED;   # Jan 2020- progression/ hilar- START abema [jan 14th]+ faslodex[jan 13th]  # OSTEOPENIA [BMD- June 2016]  # Kidney cysts  DIAGNOSIS: LEFT BREAST CA  STAGE: IV- NED  ;GOALS: pallaitive  CURRENT/MOST RECENT THERAPY: faslodex+ Abema    Breast cancer metastasized to skin ([place]  02/05/2014 Initial Diagnosis   Breast cancer metastasized to skin ([place]   Malignant neoplasm of overlapping sites of left breast in female, estrogen receptor positive ([place]  07/24/2016 Initial Diagnosis   Cancer of overlapping sites of left female breast ([facility]   01/06/2019 -  Chemotherapy   The patient had fulvestrant (FASLODEX) injection 500 mg, 500 mg, Intramuscular,  Once, 0 of 4 cycles Dose modification: 500 mg (original dose 500 mg, Cycle 1)  for chemotherapy treatment.       INTERVAL HISTORY:  A very pleasant 78year old African-American female patient with above history of ER/PR positive breast cancer stage IV currently on Faslodex + abema is here for follow-up.  Patient denies any diarrhea.  Denies any hip pain.  No nausea no vomiting.  Mild fatigue.  Appetite is good.  No [place] weight loss.   Review of Systems  Constitutional: Positive for malaise/fatigue. Negative for chills, diaphoresis, fever and weight loss.  HENT: Negative for  nosebleeds and sore throat.   Eyes: Negative for double vision.  Respiratory: Negative for cough, hemoptysis, sputum production, shortness of breath and wheezing.   Cardiovascular: Negative for chest pain, palpitations, orthopnea and leg swelling.  Gastrointestinal: Negative for abdominal pain, blood in stool, constipation, diarrhea, heartburn, melena, nausea and vomiting.  Musculoskeletal: Positive for back pain and joint pain.  Skin: Negative.  Negative for itching and rash.  Neurological: Negative for tingling, focal weakness, weakness and headaches.  Endo/Heme/Allergies: Does not bruise/bleed easily.  Psychiatric/Behavioral: Negative for depression. The patient is not nervous/anxious and does not have insomnia.      PAST MEDICAL HISTORY :  Past Medical History:  Diagnosis Date  . Aneurysm ([facility] 2007  . Arthritis   . Cancer ([facility] 2007   diagnosed 01/07 left breast with simple mastectomu & sn bx. The pt had a 3.5cm tumor. Frozen section report on the 2 sn were negative. On permanent sections a 0.553mmicrometastasis was identified. She was not felt to require a complete axillary dissection. She has completed her Tamoxifen therapy and has been released from routine f/u with medical oncology service.  . Diabetes mellitus without complication ([facility]  . Hyperlipidemia   . Hypertension    since age 78. [facility]ignant [facility]outer [facility]   Mastectomy site recurrence resected 01/26/2014, ER 90%, PR 0%, HER-2/neu nonamplified.  . Other benign neoplasm of connective and other soft tissue of trunk, unspecified 2014  . Personal history of colonic polyps   . Personal history of tobacco use, presenting hazards to health   . Special  screening for malignant neoplasms, colon   . Unspecified disorder of skin and subcutaneous tissue 02/19/2013   biopsy of skin over the sternum on the right breast showed hypertrophic scar,keloid formation.    PAST  SURGICAL HISTORY :   Past Surgical History:  Procedure Laterality Date  . ABDOMINAL HYSTERECTOMY  2004   total  . BREAST BIOPSY Right January 26, 2014   Core biopsy for mild increase breast uptake on PET scan, usual ductal hyperplasia and stromal fibrosis  . BREAST SURGERY Left 2007   mastectomy  . chest wall mass  01/26/14  . CHOLECYSTECTOMY  2007  . COLONOSCOPY W/ POLYPECTOMY  2011   Dr. Brynda Greathouse  . EYE SURGERY Right    cataract surgery  . head surgery  1991  . MASTECTOMY Left 2007  . PORT-A-CATH REMOVAL  2008  . PORTACATH PLACEMENT  2007    FAMILY HISTORY :   Family History  Problem Relation Age of Onset  . Cancer Other        ovarian,breast,colon cancers;relations not listed  . Breast cancer Neg Hx     SOCIAL HISTORY:   Social History   Tobacco Use  . Smoking status: Current Some Day Smoker    Packs/day: 1.00    Years: 50.00    Pack years: 50.00    Types: Cigarettes  . Smokeless tobacco: Never Used  Substance Use Topics  . Alcohol use: No    Alcohol/week: 0.0 standard drinks  . Drug use: No    ALLERGIES:  is allergic to metoprolol.  MEDICATIONS:  Current Outpatient Medications  Medication Sig Dispense Refill  . amLODipine (NORVASC) 10 MG tablet Take 1 tablet (10 mg total) by mouth daily. 90 tablet 1  . atorvastatin (LIPITOR) 20 MG tablet Take 20 mg by mouth daily at 6 PM.     . Calcium Carbonate-Vitamin D 600-200 MG-UNIT CAPS Take 200 capsules by mouth once.    . citalopram (CELEXA) 40 MG tablet TAKE 1 TABLET BY MOUTH EVERY DAY 90 tablet 1  . lisinopril (ZESTRIL) 2.5 MG tablet TAKE 1 TABLET BY MOUTH EVERY DAY 90 tablet 1  . VERZENIO 50 MG tablet TAKE 1 TABLET (50 MG TOTAL) BY MOUTH 2 (TWO) TIMES DAILY. 56 tablet 6   No current facility-administered medications for this visit.    Facility-Administered Medications Ordered in Other Visits  Medication Dose Route Frequency Provider Last Rate Last Dose  . fulvestrant (FASLODEX) injection 500 mg  500 mg  Intramuscular Once [name], MD        PHYSICAL EXAMINATION: ECOG PERFORMANCE STATUS: 0 - Asymptomatic  BP (!) 146/74   Pulse 80   Temp 98.5 F (36.9 C) (Tympanic)   Resp 20   Ht 5' 6"  (1.676 m)   Wt 153 lb 6.4 oz (69.6 kg)   BMI 24.76 kg/m   Filed Weights   08/20/19 1308  Weight: 153 lb 6.4 oz (69.6 kg)    Physical Exam  Constitutional: She is oriented to person, place, and time and well-developed, well-nourished, and in no distress.  She is alone.  She is walking by self.  HENT:  Head: Normocephalic and atraumatic.  Mouth/Throat: Oropharynx is clear and moist. No oropharyngeal exudate.  Eyes: Pupils are equal, round, and reactive to light.  Neck: Normal range of motion. Neck supple.  Cardiovascular: Normal rate and regular rhythm.  Pulmonary/Chest: No respiratory distress. She has no wheezes.  Abdominal: Soft. Bowel sounds are normal. She exhibits no distension and no mass. There  is no abdominal tenderness. There is no rebound and no guarding.  Musculoskeletal: Normal range of motion.        General: No tenderness or edema.  Neurological: She is alert and oriented to person, place, and time.  Skin: Skin is warm.  Psychiatric: Affect normal.     LABORATORY DATA:  I have reviewed the data as listed    Component Value Date/Time   NA 141 08/20/2019 1242   NA 139 05/30/2014 0127   K 4.3 08/20/2019 1242   K 3.7 05/30/2014 0127   CL 107 08/20/2019 1242   CL 106 05/30/2014 0127   CO2 25 08/20/2019 1242   CO2 25 05/30/2014 0127   GLUCOSE 117 (H) 08/20/2019 1242   GLUCOSE 136 (H) 05/30/2014 0127   BUN 14 08/20/2019 1242   BUN 19 (H) 05/30/2014 0127   CREATININE 1.18 (H) 08/20/2019 1242   CREATININE 0.98 05/30/2014 0127   CALCIUM 9.2 08/20/2019 1242   CALCIUM 9.0 05/30/2014 0127   PROT 7.8 08/20/2019 1242   PROT 7.8 05/08/2014 1131   ALBUMIN 4.0 08/20/2019 1242   ALBUMIN 3.7 05/08/2014 1131   AST 18 08/20/2019 1242   AST 20 05/08/2014 1131   ALT  10 08/20/2019 1242   ALT 18 05/08/2014 1131   ALKPHOS 55 08/20/2019 1242   ALKPHOS 58 05/08/2014 1131   BILITOT 0.9 08/20/2019 1242   BILITOT 0.4 05/08/2014 1131   GFRNONAA 45 (L) 08/20/2019 1242   GFRNONAA 58 (L) 05/30/2014 0127   GFRAA 52 (L) 08/20/2019 1242   GFRAA >60 05/30/2014 0127    No results found for: SPEP, UPEP  Lab Results  Component Value Date   WBC 5.0 08/20/2019   NEUTROABS 1.9 08/20/2019   HGB 10.2 (L) 08/20/2019   HCT 32.1 (L) 08/20/2019   MCV 95.3 08/20/2019   PLT 211 08/20/2019      Chemistry      Component Value Date/Time   NA 141 08/20/2019 1242   NA 139 05/30/2014 0127   K 4.3 08/20/2019 1242   K 3.7 05/30/2014 0127   CL 107 08/20/2019 1242   CL 106 05/30/2014 0127   CO2 25 08/20/2019 1242   CO2 25 05/30/2014 0127   BUN 14 08/20/2019 1242   BUN 19 (H) 05/30/2014 0127   CREATININE 1.18 (H) 08/20/2019 1242   CREATININE 0.98 05/30/2014 0127      Component Value Date/Time   CALCIUM 9.2 08/20/2019 1242   CALCIUM 9.0 05/30/2014 0127   ALKPHOS 55 08/20/2019 1242   ALKPHOS 58 05/08/2014 1131   AST 18 08/20/2019 1242   AST 20 05/08/2014 1131   ALT 10 08/20/2019 1242   ALT 18 05/08/2014 1131   BILITOT 0.9 08/20/2019 1242   BILITOT 0.4 05/08/2014 1131        RADIOGRAPHIC STUDIES: I have personally reviewed the radiological images as listed and agreed with the findings in the report. No results found.   ASSESSMENT & PLAN:   Malignant neoplasm of overlapping sites of left breast in female, estrogen receptor positive ([place]) #Left breast ipsilateral chest wall recurrence stage IV [2015]; currently on Faslodex plus abema. June 2020 PET scan-improved mediastinal adenopathy/right pleural disease.  Tumor marker overall stable in 50s-60s. STABLE.   # Continue Faslodex+ abema 45m BID. On Abema 50 mg BID today.  # Solitary bone lesion-asymptomatic.  Improved; discused re: zometa; discussed regarding hypercalcemia /osteonecrosis of the jaw.  Will  need dental clearance.  Hold off for now/COVID.  # HTN- at  home 140s.  Continue Norvasc to 10 mg a day Continue lisinopril. Stable.   # CKD- stage III-STABLE.   # Depression- stable  Declined calling the patient's daughter, Helene Kelp regarding above plan.    # DISPOSITION:   # shot today # follow up with MD/labs- cbc/cmp/in 4 weeks 27-29; Faslodex; Dr.B     [name], MD 08/20/2019 1:18 PM

## 2019-08-20 NOTE — Assessment & Plan Note (Addendum)
#  Left breast ipsilateral chest wall recurrence stage IV [2015]; currently on Faslodex plus abema. June 2020 PET scan-improved mediastinal adenopathy/right pleural disease.  Tumor marker overall stable in 50s-60s. STABLE.   # Continue Faslodex+ abema 50mg  BID. On Abema 50 mg BID today.  # Solitary bone lesion-asymptomatic.  Improved; discused re: zometa; discussed regarding hypercalcemia /osteonecrosis of the jaw.  Will need dental clearance.  Hold off for now/COVID.  # HTN- at home 140s.  Continue Norvasc to 10 mg a day Continue lisinopril. Stable.   # CKD- stage III-STABLE.   # Depression- stable  Declined calling the patient's daughter, Helene Kelp regarding above plan.    # DISPOSITION:   # shot today # follow up with MD/labs- cbc/cmp/in 4 weeks 27-29; Faslodex; Dr.B

## 2019-08-21 LAB — CANCER ANTIGEN 27.29: CA 27.29: 49.3 U/mL — ABNORMAL HIGH (ref 0.0–38.6)

## 2019-08-28 MED FILL — VERZENIO 50 MG TABS: 50 | 28 days supply | Qty: 56 | Fill #1

## 2019-09-03 ENCOUNTER — Telehealth: Payer: Self-pay | Admitting: *Deleted

## 2019-09-03 NOTE — Telephone Encounter (Signed)
Jessica Compton- Please inform daughter that we can offer patient flu shot if available at next visit.  Otherwise-patient can have the flu shot with her PCP/pharmacy.

## 2019-09-03 NOTE — Telephone Encounter (Signed)
Daughter called asking if patient can get the flu shot at her next visit or be scheduled to come in to get it. Please advise. I know in the past we have not scheduled patient just for flu shots

## 2019-09-04 NOTE — Telephone Encounter (Signed)
Jessica Compton prefers to wait until her appointment to get it at [facility]

## 2019-09-14 ENCOUNTER — Other Ambulatory Visit: Payer: Self-pay | Admitting: Internal Medicine

## 2019-09-17 ENCOUNTER — Inpatient Hospital Stay: Payer: Medicare HMO

## 2019-09-17 ENCOUNTER — Other Ambulatory Visit: Payer: Self-pay

## 2019-09-17 ENCOUNTER — Inpatient Hospital Stay: Payer: Medicare HMO | Attending: Internal Medicine | Admitting: Internal Medicine

## 2019-09-17 ENCOUNTER — Encounter: Payer: Self-pay | Admitting: Internal Medicine

## 2019-09-17 DIAGNOSIS — Z5111 Encounter for antineoplastic chemotherapy: Secondary | ICD-10-CM | POA: Insufficient documentation

## 2019-09-17 DIAGNOSIS — F17210 Nicotine dependence, cigarettes, uncomplicated: Secondary | ICD-10-CM | POA: Diagnosis not present

## 2019-09-17 DIAGNOSIS — G8929 Other chronic pain: Secondary | ICD-10-CM | POA: Diagnosis not present

## 2019-09-17 DIAGNOSIS — I129 Hypertensive chronic kidney disease with stage 1 through stage 4 chronic kidney disease, or unspecified chronic kidney disease: Secondary | ICD-10-CM | POA: Insufficient documentation

## 2019-09-17 DIAGNOSIS — M2550 Pain in unspecified joint: Secondary | ICD-10-CM | POA: Diagnosis not present

## 2019-09-17 DIAGNOSIS — R5383 Other fatigue: Secondary | ICD-10-CM | POA: Diagnosis not present

## 2019-09-17 DIAGNOSIS — N183 Chronic kidney disease, stage 3 (moderate): Secondary | ICD-10-CM | POA: Diagnosis not present

## 2019-09-17 DIAGNOSIS — C50812 Malignant neoplasm of overlapping sites of left female breast: Secondary | ICD-10-CM | POA: Insufficient documentation

## 2019-09-17 DIAGNOSIS — Z170 Estrogen receptor positive status [ER+]: Secondary | ICD-10-CM | POA: Insufficient documentation

## 2019-09-17 DIAGNOSIS — E1122 Type 2 diabetes mellitus with diabetic chronic kidney disease: Secondary | ICD-10-CM | POA: Diagnosis not present

## 2019-09-17 DIAGNOSIS — C792 Secondary malignant neoplasm of skin: Secondary | ICD-10-CM | POA: Insufficient documentation

## 2019-09-17 DIAGNOSIS — M549 Dorsalgia, unspecified: Secondary | ICD-10-CM | POA: Insufficient documentation

## 2019-09-17 DIAGNOSIS — Z79899 Other long term (current) drug therapy: Secondary | ICD-10-CM | POA: Diagnosis not present

## 2019-09-17 LAB — COMPREHENSIVE METABOLIC PANEL
ALT: 12 U/L (ref 0–44)
AST: 22 U/L (ref 15–41)
Albumin: 4.2 g/dL (ref 3.5–5.0)
Alkaline Phosphatase: 52 U/L (ref 38–126)
Anion gap: 10 (ref 5–15)
BUN: 20 mg/dL (ref 8–23)
CO2: 24 mmol/L (ref 22–32)
Calcium: 9.3 mg/dL (ref 8.9–10.3)
Chloride: 103 mmol/L (ref 98–111)
Creatinine, Ser: 1.51 mg/dL — ABNORMAL HIGH (ref 0.44–1.00)
GFR calc Af Amer: 39 mL/min — ABNORMAL LOW (ref >60)
GFR calc non Af Amer: 33 mL/min — ABNORMAL LOW (ref >60)
Glucose, Bld: 143 mg/dL — ABNORMAL HIGH (ref 70–99)
Potassium: 4.1 mmol/L (ref 3.5–5.1)
Sodium: 137 mmol/L (ref 135–145)
Total Bilirubin: 0.8 mg/dL (ref 0.3–1.2)
Total Protein: 8.3 g/dL — ABNORMAL HIGH (ref 6.5–8.1)

## 2019-09-17 LAB — CBC WITH DIFFERENTIAL/PLATELET
Abs Immature Granulocytes: 0.02 10*3/uL (ref 0.00–0.07)
Basophils Absolute: 0.1 10*3/uL (ref 0.0–0.1)
Basophils Relative: 1 %
Eosinophils Absolute: 0.1 10*3/uL (ref 0.0–0.5)
Eosinophils Relative: 2 %
HCT: 33.6 % — ABNORMAL LOW (ref 36.0–46.0)
Hemoglobin: 10.6 g/dL — ABNORMAL LOW (ref 12.0–15.0)
Immature Granulocytes: 0 %
Lymphocytes Relative: 44 %
Lymphs Abs: 2.4 10*3/uL (ref 0.7–4.0)
MCH: 30.2 pg (ref 26.0–34.0)
MCHC: 31.5 g/dL (ref 30.0–36.0)
MCV: 95.7 fL (ref 80.0–100.0)
Monocytes Absolute: 0.5 10*3/uL (ref 0.1–1.0)
Monocytes Relative: 9 %
Neutro Abs: 2.4 10*3/uL (ref 1.7–7.7)
Neutrophils Relative %: 44 %
Platelets: 234 10*3/uL (ref 150–400)
RBC: 3.51 MIL/uL — ABNORMAL LOW (ref 3.87–5.11)
RDW: 14 % (ref 11.5–15.5)
WBC: 5.5 10*3/uL (ref 4.0–10.5)
nRBC: 0 % (ref 0.0–0.2)

## 2019-09-17 MED ORDER — FULVESTRANT 250 MG/5ML IM SOLN
500.0000 mg | Freq: Once | INTRAMUSCULAR | Status: AC
  Administered 2019-09-17: 14:00:00 500 mg via INTRAMUSCULAR
  Filled 2019-09-17: qty 10

## 2019-09-17 NOTE — Assessment & Plan Note (Addendum)
#  Left breast ipsilateral chest wall recurrence stage IV [2015]; currently on Faslodex plus abema. June 2020 PET scan-improved mediastinal adenopathy/right pleural disease.  Tumor marker overall stable in 50s-60s. STABLE.   # Continue Faslodex+ abema 50mg  BID. On Abema 50 mg BID. Labs today reviewed;  acceptable for treatment today. Will repeat scans in nov-dec 2020.   # Solitary bone lesion-asymptomatic.STABLE.  Hold off Zometa because of dental clearance.  Stable  # HTN- at home 140s.  Continue Norvasc to 10 mg a day Continue lisinopril. Stable.   # CKD- stage III-STABLE; discusss hydration.    Declined my offer to call daughter.    # DISPOSITION:   # shot today # follow up with MD/labs- cbc/cmp/in 4 weeks 27-29; Faslodex; Dr.B

## 2019-09-17 NOTE — Progress Notes (Signed)
[place] OFFICE PROGRESS NOTE  Patient Care Team: [name], MD as PCP - General (Internal Medicine) Bary Castilla, Forest Gleason, MD (General Surgery) Marden Noble, MD (Internal Medicine)  Dr.Charles Mayer Masker; [place]  SUMMARY OF ONCOLOGIC HISTORY:  Oncology History Overview Note  # 2007- LEFT BREAST CA STAGE II [T2N29m; s/p mastec; Dr.Byrnett] ER-Pos/PR Neg; Her- 2 Neu- NEG; ACx4-Taxol x12;Femara; stopped May 2009-stopped follow up.  #JAN 2015- Post mastectomy Chest wall recurrence [chest wall mass bx-]; ER- 90%; PR- NEG; Her2 Neu- NEG; s/p excision [involving skeletal muscle/adipose tissue; clear margins]; CT July 2017- NED;   # Jan 2020- progression/ hilar- START abema [jan 14th]+ faslodex[jan 13th]  # OSTEOPENIA [BMD- June 2016]  # Kidney cysts  DIAGNOSIS: LEFT BREAST CA  STAGE: IV- NED  ;GOALS: pallaitive  CURRENT/MOST RECENT THERAPY: faslodex+ Abema    Breast cancer metastasized to skin ([facility]  02/05/2014 Initial Diagnosis   Breast cancer metastasized to skin ([place]   Malignant neoplasm of overlapping sites of left breast in female, estrogen receptor positive ([facility]  07/24/2016 Initial Diagnosis   Cancer of overlapping sites of left female breast ([facility]   01/06/2019 -  Chemotherapy   The patient had fulvestrant (FASLODEX) injection 500 mg, 500 mg, Intramuscular,  Once, 0 of 4 cycles Dose modification: 500 mg (original dose 500 mg, Cycle 1)  for chemotherapy treatment.     INTERVAL HISTORY:  A very pleasant 78year old African-American female patient with above history of ER/PR positive breast cancer stage IV currently on Faslodex + abema is here for follow-up.  Patient denies any worsening diarrhea.  She is chronic back pain joint pains not any worse.  Chronic mild fatigue.  Not any worse.   Review of Systems  Constitutional: Positive for malaise/fatigue. Negative for chills, diaphoresis, fever and weight loss.  HENT: Negative for  nosebleeds and sore throat.   Eyes: Negative for double vision.  Respiratory: Negative for cough, hemoptysis, sputum production, shortness of breath and wheezing.   Cardiovascular: Negative for chest pain, palpitations, orthopnea and leg swelling.  Gastrointestinal: Negative for abdominal pain, blood in stool, constipation, diarrhea, heartburn, melena, nausea and vomiting.  Musculoskeletal: Positive for back pain and joint pain.  Skin: Negative.  Negative for itching and rash.  Neurological: Negative for tingling, focal weakness, weakness and headaches.  Endo/Heme/Allergies: Does not bruise/bleed easily.  Psychiatric/Behavioral: Negative for depression. The patient is not nervous/anxious and does not have insomnia.    PAST MEDICAL HISTORY :  Past Medical History:  Diagnosis Date  . Aneurysm ([facility] 2007  . Arthritis   . Cancer ([facility] 2007   diagnosed 01/07 left breast with simple mastectomu & sn bx. The pt had a 3.5cm tumor. Frozen section report on the 2 sn were negative. On permanent sections a 0.53mmicrometastasis was identified. She was not felt to require a complete axillary dissection. She has completed her Tamoxifen therapy and has been released from routine f/u with medical oncology service.  . Diabetes mellitus without complication ([facility]  . Hyperlipidemia   . Hypertension    since age 78. [facility]   Mastectomy site recurrence resected 01/26/2014, ER 90%, PR 0%, HER-2/neu nonamplified.  . Other benign neoplasm of connective and other soft tissue of trunk, unspecified 2014  . Personal history of colonic polyps   . Personal history of tobacco use, presenting hazards to health   . Special screening for malignant neoplasms, colon   .  Unspecified disorder of skin and subcutaneous tissue 02/19/2013   biopsy of skin over the sternum on the right breast showed hypertrophic scar,keloid formation.    PAST SURGICAL  HISTORY :   Past Surgical History:  Procedure Laterality Date  . ABDOMINAL HYSTERECTOMY  2004   total  . BREAST BIOPSY Right January 26, 2014   Core biopsy for mild increase breast uptake on PET scan, usual ductal hyperplasia and stromal fibrosis  . BREAST SURGERY Left 2007   mastectomy  . chest wall mass  01/26/14  . CHOLECYSTECTOMY  2007  . COLONOSCOPY W/ POLYPECTOMY  2011   Dr. Brynda Greathouse  . EYE SURGERY Right    cataract surgery  . head surgery  1991  . MASTECTOMY Left 2007  . PORT-A-CATH REMOVAL  2008  . PORTACATH PLACEMENT  2007    FAMILY HISTORY :   Family History  Problem Relation Age of Onset  . Cancer Other        ovarian,breast,colon cancers;relations not listed  . Breast cancer Neg Hx     SOCIAL HISTORY:   Social History   Tobacco Use  . Smoking status: Current Some Day Smoker    Packs/day: 1.00    Years: 50.00    Pack years: 50.00    Types: Cigarettes  . Smokeless tobacco: Never Used  Substance Use Topics  . Alcohol use: No    Alcohol/week: 0.0 standard drinks  . Drug use: No    ALLERGIES:  is allergic to metoprolol.  MEDICATIONS:  Current Outpatient Medications  Medication Sig Dispense Refill  . amLODipine (NORVASC) 10 MG tablet TAKE 1 TABLET BY MOUTH EVERY DAY 90 tablet 1  . atorvastatin (LIPITOR) 20 MG tablet Take 20 mg by mouth daily at 6 PM.     . Calcium Carbonate-Vitamin D 600-200 MG-UNIT CAPS Take 200 capsules by mouth once.    . citalopram (CELEXA) 40 MG tablet TAKE 1 TABLET BY MOUTH EVERY DAY 90 tablet 1  . lisinopril (ZESTRIL) 2.5 MG tablet TAKE 1 TABLET BY MOUTH EVERY DAY 90 tablet 1  . VERZENIO 50 MG tablet TAKE 1 TABLET (50 MG TOTAL) BY MOUTH 2 (TWO) TIMES DAILY. 56 tablet 6   No current facility-administered medications for this visit.    Facility-Administered Medications Ordered in Other Visits  Medication Dose Route Frequency Provider Last Rate Last Dose  . fulvestrant (FASLODEX) injection 500 mg  500 mg Intramuscular Once [name], MD        PHYSICAL EXAMINATION: ECOG PERFORMANCE STATUS: 0 - Asymptomatic  BP (!) 142/77   Pulse 76   Temp 97.7 F (36.5 C) (Tympanic)   Resp 20   Ht 5' 6"  (1.676 m)   Wt 152 lb 3.2 oz (69 kg)   BMI 24.57 kg/m   Filed Weights   09/17/19 1322  Weight: 152 lb 3.2 oz (69 kg)    Physical Exam  Constitutional: She is oriented to person, place, and time and well-developed, well-nourished, and in no distress.  She is alone.  She is walking by self.  HENT:  Head: Normocephalic and atraumatic.  Mouth/Throat: Oropharynx is clear and moist. No oropharyngeal exudate.  Eyes: Pupils are equal, round, and reactive to light.  Neck: Normal range of motion. Neck supple.  Cardiovascular: Normal rate and regular rhythm.  Pulmonary/Chest: No respiratory distress. She has no wheezes.  Abdominal: Soft. Bowel sounds are normal. She exhibits no distension and no mass. There is no abdominal tenderness. There is no rebound and no  guarding.  Musculoskeletal: Normal range of motion.        General: No tenderness or edema.  Neurological: She is alert and oriented to person, place, and time.  Skin: Skin is warm.  Psychiatric: Affect normal.   LABORATORY DATA:  I have reviewed the data as listed    Component Value Date/Time   NA 137 09/17/2019 1241   NA 139 05/30/2014 0127   K 4.1 09/17/2019 1241   K 3.7 05/30/2014 0127   CL 103 09/17/2019 1241   CL 106 05/30/2014 0127   CO2 24 09/17/2019 1241   CO2 25 05/30/2014 0127   GLUCOSE 143 (H) 09/17/2019 1241   GLUCOSE 136 (H) 05/30/2014 0127   BUN 20 09/17/2019 1241   BUN 19 (H) 05/30/2014 0127   CREATININE 1.51 (H) 09/17/2019 1241   CREATININE 0.98 05/30/2014 0127   CALCIUM 9.3 09/17/2019 1241   CALCIUM 9.0 05/30/2014 0127   PROT 8.3 (H) 09/17/2019 1241   PROT 7.8 05/08/2014 1131   ALBUMIN 4.2 09/17/2019 1241   ALBUMIN 3.7 05/08/2014 1131   AST 22 09/17/2019 1241   AST 20 05/08/2014 1131   ALT 12 09/17/2019 1241   ALT 18  05/08/2014 1131   ALKPHOS 52 09/17/2019 1241   ALKPHOS 58 05/08/2014 1131   BILITOT 0.8 09/17/2019 1241   BILITOT 0.4 05/08/2014 1131   GFRNONAA 33 (L) 09/17/2019 1241   GFRNONAA 58 (L) 05/30/2014 0127   GFRAA 39 (L) 09/17/2019 1241   GFRAA >60 05/30/2014 0127    No results found for: SPEP, UPEP  Lab Results  Component Value Date   WBC 5.5 09/17/2019   NEUTROABS 2.4 09/17/2019   HGB 10.6 (L) 09/17/2019   HCT 33.6 (L) 09/17/2019   MCV 95.7 09/17/2019   PLT 234 09/17/2019      Chemistry      Component Value Date/Time   NA 137 09/17/2019 1241   NA 139 05/30/2014 0127   K 4.1 09/17/2019 1241   K 3.7 05/30/2014 0127   CL 103 09/17/2019 1241   CL 106 05/30/2014 0127   CO2 24 09/17/2019 1241   CO2 25 05/30/2014 0127   BUN 20 09/17/2019 1241   BUN 19 (H) 05/30/2014 0127   CREATININE 1.51 (H) 09/17/2019 1241   CREATININE 0.98 05/30/2014 0127      Component Value Date/Time   CALCIUM 9.3 09/17/2019 1241   CALCIUM 9.0 05/30/2014 0127   ALKPHOS 52 09/17/2019 1241   ALKPHOS 58 05/08/2014 1131   AST 22 09/17/2019 1241   AST 20 05/08/2014 1131   ALT 12 09/17/2019 1241   ALT 18 05/08/2014 1131   BILITOT 0.8 09/17/2019 1241   BILITOT 0.4 05/08/2014 1131        RADIOGRAPHIC STUDIES: I have personally reviewed the radiological images as listed and agreed with the findings in the report. No results found.   ASSESSMENT & PLAN:   Malignant neoplasm of overlapping sites of left breast in female, estrogen receptor positive ([place]) #Left breast ipsilateral chest wall recurrence stage IV [2015]; currently on Faslodex plus abema. June 2020 PET scan-improved mediastinal adenopathy/right pleural disease.  Tumor marker overall stable in 50s-60s. STABLE.   # Continue Faslodex+ abema 66m BID. On Abema 50 mg BID. Labs today reviewed;  acceptable for treatment today. Will repeat scans in nov-dec 2020.   # Solitary bone lesion-asymptomatic.STABLE.  Hold off Zometa because of dental  clearance.  Stable  # HTN- at home 140s.  Continue Norvasc to 10 mg a  day Continue lisinopril. Stable.   # CKD- stage III-STABLE; discusss hydration.    Declined my offer to call daughter.    # DISPOSITION:   # shot today # follow up with MD/labs- cbc/cmp/in 4 weeks 27-29; Faslodex; Dr.B     [name], MD 09/17/2019 2:02 PM

## 2019-09-18 LAB — CANCER ANTIGEN 27.29: CA 27.29: 51 U/mL — ABNORMAL HIGH (ref 0.0–38.6)

## 2019-10-06 MED FILL — VERZENIO 50 MG TABS: 50 | 28 days supply | Qty: 56 | Fill #2

## 2019-10-07 DIAGNOSIS — H524 Presbyopia: Secondary | ICD-10-CM | POA: Diagnosis not present

## 2019-10-13 DIAGNOSIS — H26491 Other secondary cataract, right eye: Secondary | ICD-10-CM | POA: Diagnosis not present

## 2019-10-13 DIAGNOSIS — H2512 Age-related nuclear cataract, left eye: Secondary | ICD-10-CM | POA: Diagnosis not present

## 2019-10-13 DIAGNOSIS — H25042 Posterior subcapsular polar age-related cataract, left eye: Secondary | ICD-10-CM | POA: Diagnosis not present

## 2019-10-13 DIAGNOSIS — Z961 Presence of intraocular lens: Secondary | ICD-10-CM | POA: Diagnosis not present

## 2019-10-14 ENCOUNTER — Other Ambulatory Visit: Payer: Self-pay

## 2019-10-14 ENCOUNTER — Encounter: Payer: Self-pay | Admitting: Oncology

## 2019-10-14 NOTE — Progress Notes (Signed)
Patient stated that she had been doing well with no complaints. 

## 2019-10-15 ENCOUNTER — Other Ambulatory Visit: Payer: Self-pay

## 2019-10-15 ENCOUNTER — Inpatient Hospital Stay: Payer: Medicare HMO

## 2019-10-15 ENCOUNTER — Inpatient Hospital Stay (HOSPITAL_BASED_OUTPATIENT_CLINIC_OR_DEPARTMENT_OTHER): Payer: Medicare HMO | Admitting: Nurse Practitioner

## 2019-10-15 ENCOUNTER — Inpatient Hospital Stay: Payer: Medicare HMO | Attending: Oncology

## 2019-10-15 DIAGNOSIS — Z170 Estrogen receptor positive status [ER+]: Secondary | ICD-10-CM | POA: Insufficient documentation

## 2019-10-15 DIAGNOSIS — C792 Secondary malignant neoplasm of skin: Secondary | ICD-10-CM | POA: Diagnosis not present

## 2019-10-15 DIAGNOSIS — Z5111 Encounter for antineoplastic chemotherapy: Secondary | ICD-10-CM | POA: Insufficient documentation

## 2019-10-15 DIAGNOSIS — C50812 Malignant neoplasm of overlapping sites of left female breast: Secondary | ICD-10-CM

## 2019-10-15 LAB — COMPREHENSIVE METABOLIC PANEL
ALT: 10 U/L (ref 0–44)
AST: 20 U/L (ref 15–41)
Albumin: 4 g/dL (ref 3.5–5.0)
Alkaline Phosphatase: 58 U/L (ref 38–126)
Anion gap: 8 (ref 5–15)
BUN: 18 mg/dL (ref 8–23)
CO2: 27 mmol/L (ref 22–32)
Calcium: 9.3 mg/dL (ref 8.9–10.3)
Chloride: 104 mmol/L (ref 98–111)
Creatinine, Ser: 1.4 mg/dL — ABNORMAL HIGH (ref 0.44–1.00)
GFR calc Af Amer: 42 mL/min — ABNORMAL LOW (ref >60)
GFR calc non Af Amer: 36 mL/min — ABNORMAL LOW (ref >60)
Glucose, Bld: 101 mg/dL — ABNORMAL HIGH (ref 70–99)
Potassium: 4.1 mmol/L (ref 3.5–5.1)
Sodium: 139 mmol/L (ref 135–145)
Total Bilirubin: 0.7 mg/dL (ref 0.3–1.2)
Total Protein: 7.8 g/dL (ref 6.5–8.1)

## 2019-10-15 LAB — CBC WITH DIFFERENTIAL/PLATELET
Abs Immature Granulocytes: 0.01 10*3/uL (ref 0.00–0.07)
Basophils Absolute: 0.1 10*3/uL (ref 0.0–0.1)
Basophils Relative: 1 %
Eosinophils Absolute: 0.1 10*3/uL (ref 0.0–0.5)
Eosinophils Relative: 2 %
HCT: 32.1 % — ABNORMAL LOW (ref 36.0–46.0)
Hemoglobin: 10 g/dL — ABNORMAL LOW (ref 12.0–15.0)
Immature Granulocytes: 0 %
Lymphocytes Relative: 49 %
Lymphs Abs: 2.8 10*3/uL (ref 0.7–4.0)
MCH: 30.7 pg (ref 26.0–34.0)
MCHC: 31.2 g/dL (ref 30.0–36.0)
MCV: 98.5 fL (ref 80.0–100.0)
Monocytes Absolute: 0.5 10*3/uL (ref 0.1–1.0)
Monocytes Relative: 9 %
Neutro Abs: 2.3 10*3/uL (ref 1.7–7.7)
Neutrophils Relative %: 39 %
Platelets: 227 10*3/uL (ref 150–400)
RBC: 3.26 MIL/uL — ABNORMAL LOW (ref 3.87–5.11)
RDW: 13.9 % (ref 11.5–15.5)
WBC: 5.8 10*3/uL (ref 4.0–10.5)
nRBC: 0 % (ref 0.0–0.2)

## 2019-10-15 MED ORDER — FULVESTRANT 250 MG/5ML IM SOLN
500.0000 mg | Freq: Once | INTRAMUSCULAR | Status: AC
  Administered 2019-10-15: 500 mg via INTRAMUSCULAR
  Filled 2019-10-15: qty 10

## 2019-10-15 NOTE — Progress Notes (Signed)
[place] OFFICE PROGRESS NOTE  Patient Care Team: [name], MD as PCP - General (Internal Medicine) Bary Castilla, Forest Gleason, MD (General Surgery) Marden Noble, MD (Internal Medicine)  Dr.Charles Mayer Masker; [place]  SUMMARY OF ONCOLOGIC HISTORY:  Oncology History Overview Note  # 2007- LEFT BREAST CA STAGE II [T2N37m; s/p mastec; Dr.Byrnett] ER-Pos/PR Neg; Her- 2 Neu- NEG; ACx4-Taxol x12;Femara; stopped May 2009-stopped follow up.  #JAN 2015- Post mastectomy Chest wall recurrence [chest wall mass bx-]; ER- 90%; PR- NEG; Her2 Neu- NEG; s/p excision [involving skeletal muscle/adipose tissue; clear margins]; CT July 2017- NED;   # Jan 2020- progression/ hilar- START abema [jan 14th]+ faslodex[jan 13th]  # OSTEOPENIA [BMD- June 2016]  # Kidney cysts  DIAGNOSIS: LEFT BREAST CA  STAGE: IV- NED  ;GOALS: pallaitive  CURRENT/MOST RECENT THERAPY: faslodex+ Abema    Breast cancer metastasized to skin ([place]  02/05/2014 Initial Diagnosis   Breast cancer metastasized to skin ([facility]   Malignant neoplasm of overlapping sites of left breast in female, estrogen receptor positive ([place]  07/24/2016 Initial Diagnosis   Cancer of overlapping sites of left female breast ([place]   01/06/2019 -  Chemotherapy   The patient had fulvestrant (FASLODEX) injection 500 mg, 500 mg, Intramuscular,  Once, 0 of 4 cycles Dose modification: 500 mg (original dose 500 mg, Cycle 1)  for chemotherapy treatment.     INTERVAL HISTORY: ESCHAE CANDO 78y.o., female very pleasant African-American patient with above history of ER/PR positive breast cancer, stage IV, currently Faslodex and Abemaciclib, returns to clinic for follow-up.  She has not seen a dentist for dental clearance and says she had all of her teeth pulled many years ago.  Today, she denies any diarrhea.  She has chronic back and joint pain which is stable and unchanged.  She has chronic mild fatigue which is stable and  unchanged.  Denies headaches, dizziness, weight loss. She says she is tolerating treatment well and denies any complaints.  She denies new lumps or bumps.  Review of Systems  Constitutional: Positive for malaise/fatigue (Mild). Negative for chills, diaphoresis, fever and weight loss.  HENT: Negative for nosebleeds and sore throat.   Eyes: Negative for double vision.  Respiratory: Negative for cough, hemoptysis, sputum production, shortness of breath and wheezing.   Cardiovascular: Negative for chest pain, palpitations, orthopnea and leg swelling.  Gastrointestinal: Negative for abdominal pain, blood in stool, constipation, diarrhea, heartburn, melena, nausea and vomiting.  Musculoskeletal: Positive for back pain and joint pain. Negative for falls.  Skin: Negative.  Negative for itching and rash.  Neurological: Negative for dizziness, tingling, focal weakness, weakness and headaches.  Endo/Heme/Allergies: Does not bruise/bleed easily.  Psychiatric/Behavioral: Negative for depression. The patient is not nervous/anxious and does not have insomnia.    PAST MEDICAL HISTORY :  Past Medical History:  Diagnosis Date  . [place]neurysm ([facility] 2007  . Arthritis   . Cancer ([facility] 2007   diagnosed 01/07 left breast with simple mastectomu & sn bx. The pt had a 3.5cm tumor. Frozen section report on the 2 sn were negative. On permanent sections a 0.58mmicrometastasis was identified. She was not felt to require a complete axillary dissection. She has completed her Tamoxifen therapy and has been released from routine f/u with medical oncology service.  . Diabetes mellitus without complication ([facility]  . Hyperlipidemia   . Hypertension    since age 78. [facility]outer quadrant of [facility] ([facility]  Mastectomy site recurrence resected 01/26/2014, ER 90%, PR 0%, HER-2/neu nonamplified.  . Other benign neoplasm of connective and other soft tissue of trunk, unspecified 2014  .  Personal history of colonic polyps   . Personal history of tobacco use, presenting hazards to health   . Special screening for malignant neoplasms, colon   . Unspecified disorder of skin and subcutaneous tissue 02/19/2013   biopsy of skin over the sternum on the right breast showed hypertrophic scar,keloid formation.    PAST SURGICAL HISTORY :   Past Surgical History:  Procedure Laterality Date  . ABDOMINAL HYSTERECTOMY  2004   total  . BREAST BIOPSY Right January 26, 2014   Core biopsy for mild increase breast uptake on PET scan, usual ductal hyperplasia and stromal fibrosis  . BREAST SURGERY Left 2007   mastectomy  . chest wall mass  01/26/14  . CHOLECYSTECTOMY  2007  . COLONOSCOPY W/ POLYPECTOMY  2011   Dr. Brynda Greathouse  . EYE SURGERY Right    cataract surgery  . head surgery  1991  . MASTECTOMY Left 2007  . PORT-A-CATH REMOVAL  2008  . PORTACATH PLACEMENT  2007    FAMILY HISTORY :   Family History  Problem Relation Age of Onset  . Cancer Other        ovarian,breast,colon cancers;relations not listed  . Breast cancer Neg Hx     SOCIAL HISTORY:   Social History   Tobacco Use  . Smoking status: Current Some Day Smoker    Packs/day: 1.00    Years: 50.00    Pack years: 50.00    Types: Cigarettes  . Smokeless tobacco: Never Used  Substance Use Topics  . Alcohol use: No    Alcohol/week: 0.0 standard drinks  . Drug use: No    ALLERGIES:  is allergic to metoprolol.  MEDICATIONS:  Current Outpatient Medications  Medication Sig Dispense Refill  . amLODipine (NORVASC) 10 MG tablet TAKE 1 TABLET BY MOUTH EVERY DAY 90 tablet 1  . atorvastatin (LIPITOR) 20 MG tablet Take 20 mg by mouth daily at 6 PM.     . Calcium Carbonate-Vitamin D 600-200 MG-UNIT CAPS Take 200 capsules by mouth once.    . citalopram (CELEXA) 40 MG tablet TAKE 1 TABLET BY MOUTH EVERY DAY 90 tablet 1  . lisinopril (ZESTRIL) 2.5 MG tablet TAKE 1 TABLET BY MOUTH EVERY DAY 90 tablet 1  . VERZENIO 50 MG tablet  TAKE 1 TABLET (50 MG TOTAL) BY MOUTH 2 (TWO) TIMES DAILY. 56 tablet 6   No current facility-administered medications for this visit.     PHYSICAL EXAMINATION: ECOG PERFORMANCE STATUS: 0 - Asymptomatic  BP (!) 148/61 (BP Location: Right Arm, Patient Position: Sitting)   Pulse 73   Temp 97.6 F (36.4 C) (Tympanic)   Resp 18   Wt 156 lb (70.8 kg)   BMI 25.18 kg/m   Filed Weights   10/15/19 1354  Weight: 156 lb (70.8 kg)    Physical Exam  Constitutional: She is oriented to person, place, and time and well-developed, well-nourished, and in no distress.  She is alone.  She is walking by self.  HENT:  Head: Normocephalic and atraumatic.  Mouth/Throat: Oropharynx is clear and moist. No oropharyngeal exudate.  Eyes: Pupils are equal, round, and reactive to light.  Neck: Normal range of motion. Neck supple.  Cardiovascular: Normal rate and regular rhythm.  Pulmonary/Chest: No respiratory distress. She has no wheezes.  Abdominal: Soft. Bowel sounds are normal. She exhibits no  distension and no mass. There is no abdominal tenderness. There is no rebound and no guarding.  Musculoskeletal: Normal range of motion.        General: No tenderness or edema.  Neurological: She is alert and oriented to person, place, and time.  Skin: Skin is warm.  Psychiatric: Affect normal.   LABORATORY DATA:  I have reviewed the data as listed    Component Value Date/Time   NA 139 10/15/2019 1334   NA 139 05/30/2014 0127   K 4.1 10/15/2019 1334   K 3.7 05/30/2014 0127   CL 104 10/15/2019 1334   CL 106 05/30/2014 0127   CO2 27 10/15/2019 1334   CO2 25 05/30/2014 0127   GLUCOSE 101 (H) 10/15/2019 1334   GLUCOSE 136 (H) 05/30/2014 0127   BUN 18 10/15/2019 1334   BUN 19 (H) 05/30/2014 0127   CREATININE 1.40 (H) 10/15/2019 1334   CREATININE 0.98 05/30/2014 0127   CALCIUM 9.3 10/15/2019 1334   CALCIUM 9.0 05/30/2014 0127   PROT 7.8 10/15/2019 1334   PROT 7.8 05/08/2014 1131   ALBUMIN 4.0  10/15/2019 1334   ALBUMIN 3.7 05/08/2014 1131   AST 20 10/15/2019 1334   AST 20 05/08/2014 1131   ALT 10 10/15/2019 1334   ALT 18 05/08/2014 1131   ALKPHOS 58 10/15/2019 1334   ALKPHOS 58 05/08/2014 1131   BILITOT 0.7 10/15/2019 1334   BILITOT 0.4 05/08/2014 1131   GFRNONAA 36 (L) 10/15/2019 1334   GFRNONAA 58 (L) 05/30/2014 0127   GFRAA 42 (L) 10/15/2019 1334   GFRAA >60 05/30/2014 0127    No results found for: SPEP, UPEP  Lab Results  Component Value Date   WBC 5.8 10/15/2019   NEUTROABS 2.3 10/15/2019   HGB 10.0 (L) 10/15/2019   HCT 32.1 (L) 10/15/2019   MCV 98.5 10/15/2019   PLT 227 10/15/2019      Chemistry      Component Value Date/Time   NA 139 10/15/2019 1334   NA 139 05/30/2014 0127   K 4.1 10/15/2019 1334   K 3.7 05/30/2014 0127   CL 104 10/15/2019 1334   CL 106 05/30/2014 0127   CO2 27 10/15/2019 1334   CO2 25 05/30/2014 0127   BUN 18 10/15/2019 1334   BUN 19 (H) 05/30/2014 0127   CREATININE 1.40 (H) 10/15/2019 1334   CREATININE 0.98 05/30/2014 0127      Component Value Date/Time   CALCIUM 9.3 10/15/2019 1334   CALCIUM 9.0 05/30/2014 0127   ALKPHOS 58 10/15/2019 1334   ALKPHOS 58 05/08/2014 1131   AST 20 10/15/2019 1334   AST 20 05/08/2014 1131   ALT 10 10/15/2019 1334   ALT 18 05/08/2014 1131   BILITOT 0.7 10/15/2019 1334   BILITOT 0.4 05/08/2014 1131      RADIOGRAPHIC STUDIES: I have personally reviewed the radiological images as listed and agreed with the findings in the report. 06/09/2019-PET for restaging 1. Response to therapy of nodal and probable pleuroparenchymal disease in the right hemithorax. No new soft tissue metastasis. 2. Metabolic response to therapy of bilateral iliac osseous metastasis. 3. Aortic atherosclerosis (ICD10-I70.0), coronary artery atherosclerosis and emphysema (ICD10-J43.9). 4. Left mastectomy.  No results found.   ASSESSMENT & PLAN:  Malignant neoplasm of overlapping sites of left breast in female, estrogen  receptor positive ([place])  #Left breast ipsilateral chest wall recurrence-stage IV (2015)-currently on Faslodex + Abemaciclib.  June 2020 PET scan showed improved mediastinal adenopathy and right pleural disease.  Tumor marker  has been overall stable in 50s-60s.  Stable.  #Continue Faslodex 500 mg every 28 days + Abema 50 mg BID.  Labs today reviewed and acceptable for treatment.  CA 27-29 pending.  We will plan to repeat PET scan in December 2020 for restaging.  #Solitary bone lesion-asymptomatic.  Previously discussed Zometa.  On hold due to dental clearance.  Recommended patient see a dentist for dental clearance.  Stable.  #Hypertension-reports home BPs 140s, consistent with readings today.  Continue Norvasc 10 mg daily.  Concern over  # HTN- at home 140s.  Continue Norvasc to 10 mg a day Continue lisinopril 2.5 mg daily.  Stable.   # CKD- stage III. Creatinine 1.40, BUN 18, EGFR 42.  Stable.  Again encouraged hydration.  Continue to monitor.  #Depression-currently on Celexa. Per Dr. Aletha Halim previous note, we recommended she follow-up with primary care provider for consideration of continuation and refills.  Disposition # Faslodex today # Return to clinic in 4 weeks for labs (CBC, CMP, Ca27.29), MD evaluation, and consideration of Faslodex.  Beckey Rutter, DNP, AGNP-[facility] [place]: Dr. Rogue Bussing

## 2019-10-15 NOTE — Assessment & Plan Note (Signed)
#  Left breast ipsilateral chest wall recurrence-stage IV (2015)-currently on Faslodex + Abemaciclib.  June 2020 PET scan showed improved mediastinal adenopathy and right pleural disease.  Tumor marker has been overall stable in 50s-60s.  Stable.  #Continue Faslodex 500 mg every 28 days + Abema 50 mg BID.  Labs today reviewed and acceptable for treatment.  CA 27-29 pending.  We will plan to repeat PET scan in December 2020 for restaging.  #Solitary bone lesion-asymptomatic.  Previously discussed Zometa.  On hold due to dental clearance.  Recommended patient see a dentist for dental clearance.  Stable.  #Hypertension-reports home BPs 140s, consistent with readings today.  Continue Norvasc 10 mg daily.  Concern over  # HTN- at home 140s.  Continue Norvasc to 10 mg a day Continue lisinopril 2.5 mg daily.  Stable.   # CKD- stage III. Creatinine 1.40, BUN 18, EGFR 42.  Stable.  Again encouraged hydration.  Continue to monitor.  #Depression-currently on Celexa. Per Dr. Aletha Halim previous note, we recommended she follow-up with primary care provider for consideration of continuation and refills.  Disposition # Faslodex today # Return to clinic in 4 weeks for labs (CBC, CMP, Ca27.29), MD evaluation, and consideration of Faslodex.

## 2019-10-16 LAB — CANCER ANTIGEN 27.29: CA 27.29: 41.1 U/mL — ABNORMAL HIGH (ref 0.0–38.6)

## 2019-11-06 DIAGNOSIS — H2512 Age-related nuclear cataract, left eye: Secondary | ICD-10-CM | POA: Diagnosis not present

## 2019-11-06 MED FILL — VERZENIO 50 MG TABS: 50 | 28 days supply | Qty: 56 | Fill #3

## 2019-11-13 ENCOUNTER — Other Ambulatory Visit: Payer: Self-pay

## 2019-11-13 NOTE — Progress Notes (Signed)
Spoke with daughter- She is requesting that pt receive her influenza injection tom.

## 2019-11-14 ENCOUNTER — Inpatient Hospital Stay: Payer: Medicare HMO | Attending: Internal Medicine

## 2019-11-14 ENCOUNTER — Inpatient Hospital Stay: Payer: Medicare HMO

## 2019-11-14 ENCOUNTER — Encounter: Payer: Self-pay | Admitting: Internal Medicine

## 2019-11-14 ENCOUNTER — Other Ambulatory Visit: Payer: Self-pay

## 2019-11-14 ENCOUNTER — Inpatient Hospital Stay (HOSPITAL_BASED_OUTPATIENT_CLINIC_OR_DEPARTMENT_OTHER): Payer: Medicare HMO | Admitting: Internal Medicine

## 2019-11-14 DIAGNOSIS — C50812 Malignant neoplasm of overlapping sites of left female breast: Secondary | ICD-10-CM

## 2019-11-14 DIAGNOSIS — Z5111 Encounter for antineoplastic chemotherapy: Secondary | ICD-10-CM | POA: Diagnosis not present

## 2019-11-14 DIAGNOSIS — Z23 Encounter for immunization: Secondary | ICD-10-CM | POA: Insufficient documentation

## 2019-11-14 DIAGNOSIS — C792 Secondary malignant neoplasm of skin: Secondary | ICD-10-CM | POA: Insufficient documentation

## 2019-11-14 DIAGNOSIS — Z170 Estrogen receptor positive status [ER+]: Secondary | ICD-10-CM | POA: Diagnosis not present

## 2019-11-14 LAB — COMPREHENSIVE METABOLIC PANEL
ALT: 10 U/L (ref 0–44)
AST: 17 U/L (ref 15–41)
Albumin: 3.7 g/dL (ref 3.5–5.0)
Alkaline Phosphatase: 56 U/L (ref 38–126)
Anion gap: 5 (ref 5–15)
BUN: 17 mg/dL (ref 8–23)
CO2: 27 mmol/L (ref 22–32)
Calcium: 8.6 mg/dL — ABNORMAL LOW (ref 8.9–10.3)
Chloride: 106 mmol/L (ref 98–111)
Creatinine, Ser: 1.26 mg/dL — ABNORMAL HIGH (ref 0.44–1.00)
GFR calc Af Amer: 48 mL/min — ABNORMAL LOW (ref >60)
GFR calc non Af Amer: 41 mL/min — ABNORMAL LOW (ref >60)
Glucose, Bld: 96 mg/dL (ref 70–99)
Potassium: 3.9 mmol/L (ref 3.5–5.1)
Sodium: 138 mmol/L (ref 135–145)
Total Bilirubin: 0.4 mg/dL (ref 0.3–1.2)
Total Protein: 7.1 g/dL (ref 6.5–8.1)

## 2019-11-14 LAB — CBC WITH DIFFERENTIAL/PLATELET
Abs Immature Granulocytes: 0.01 10*3/uL (ref 0.00–0.07)
Basophils Absolute: 0.1 10*3/uL (ref 0.0–0.1)
Basophils Relative: 1 %
Eosinophils Absolute: 0.2 10*3/uL (ref 0.0–0.5)
Eosinophils Relative: 3 %
HCT: 33.3 % — ABNORMAL LOW (ref 36.0–46.0)
Hemoglobin: 10.4 g/dL — ABNORMAL LOW (ref 12.0–15.0)
Immature Granulocytes: 0 %
Lymphocytes Relative: 49 %
Lymphs Abs: 2.3 10*3/uL (ref 0.7–4.0)
MCH: 31 pg (ref 26.0–34.0)
MCHC: 31.2 g/dL (ref 30.0–36.0)
MCV: 99.1 fL (ref 80.0–100.0)
Monocytes Absolute: 0.4 10*3/uL (ref 0.1–1.0)
Monocytes Relative: 9 %
Neutro Abs: 1.8 10*3/uL (ref 1.7–7.7)
Neutrophils Relative %: 38 %
Platelets: 200 10*3/uL (ref 150–400)
RBC: 3.36 MIL/uL — ABNORMAL LOW (ref 3.87–5.11)
RDW: 13 % (ref 11.5–15.5)
WBC: 4.7 10*3/uL (ref 4.0–10.5)
nRBC: 0 % (ref 0.0–0.2)

## 2019-11-14 MED ORDER — FULVESTRANT 250 MG/5ML IM SOLN
500.0000 mg | Freq: Once | INTRAMUSCULAR | Status: AC
  Administered 2019-11-14: 15:00:00 500 mg via INTRAMUSCULAR
  Filled 2019-11-14: qty 10

## 2019-11-14 MED ORDER — INFLUENZA VAC A&B SA ADJ QUAD 0.5 ML IM PRSY
0.5000 mL | PREFILLED_SYRINGE | Freq: Once | INTRAMUSCULAR | Status: AC
  Administered 2019-11-14: 0.5 mL via INTRAMUSCULAR
  Filled 2019-11-14: qty 0.5

## 2019-11-14 MED ORDER — INFLUENZA VAC A&B SA ADJ QUAD 0.5 ML IM PRSY: 0.5000 mL | PREFILLED_SYRINGE | Freq: Once | INTRAMUSCULAR | Status: DC

## 2019-11-14 NOTE — Progress Notes (Signed)
[place] OFFICE PROGRESS NOTE  Patient Care Team: [name], MD as PCP - General (Internal Medicine) Bary Castilla, Forest Gleason, MD (General Surgery) Marden Noble, MD (Internal Medicine)  Dr.Charles Mayer Masker; [place]  SUMMARY OF ONCOLOGIC HISTORY:  Oncology History Overview Note  # 2007- LEFT BREAST CA STAGE II [T2N54m; s/p mastec; Dr.Byrnett] ER-Pos/PR Neg; Her- 2 Neu- NEG; ACx4-Taxol x12;Femara; stopped May 2009-stopped follow up.  #JAN 2015- Post mastectomy Chest wall recurrence [chest wall mass bx-]; ER- 90%; PR- NEG; Her2 Neu- NEG; s/p excision [involving skeletal muscle/adipose tissue; clear margins]; CT July 2017- NED;   # Jan 2020- progression/ hilar- START abema [jan 14th]+ faslodex[jan 13th]  # OSTEOPENIA [BMD- June 2016]  # Kidney cysts  DIAGNOSIS: LEFT BREAST CA  STAGE: IV- NED  ;GOALS: pallaitive  CURRENT/MOST RECENT THERAPY: faslodex+ Abema    Breast cancer metastasized to skin ([facility]  02/05/2014 Initial Diagnosis   Breast cancer metastasized to skin ([facility]   Malignant neoplasm of overlapping sites of left breast in female, estrogen receptor positive ([place]  07/24/2016 Initial Diagnosis   Cancer of overlapping sites of left female breast ([facility]   01/06/2019 -  Chemotherapy   The patient had fulvestrant (FASLODEX) injection 500 mg, 500 mg, Intramuscular,  Once, 0 of 4 cycles Dose modification: 500 mg (original dose 500 mg, Cycle 1)  for chemotherapy treatment.     INTERVAL HISTORY: Accompanied by daughter.  A very pleasant 78year old African-American female patient with above history of ER/PR positive breast cancer stage IV currently on Faslodex + abema is here for follow-up.  Patient denies any bone pain.  Any nausea vomiting.  Chronic mild fatigue.  Chronic back pain not any worse.  No headaches.  Review of Systems  Constitutional: Positive for malaise/fatigue. Negative for chills, diaphoresis, fever and weight loss.  HENT:  Negative for nosebleeds and sore throat.   Eyes: Negative for double vision.  Respiratory: Negative for cough, hemoptysis, sputum production, shortness of breath and wheezing.   Cardiovascular: Negative for chest pain, palpitations, orthopnea and leg swelling.  Gastrointestinal: Negative for abdominal pain, blood in stool, constipation, diarrhea, heartburn, melena, nausea and vomiting.  Musculoskeletal: Positive for back pain and joint pain.  Skin: Negative.  Negative for itching and rash.  Neurological: Negative for tingling, focal weakness, weakness and headaches.  Endo/Heme/Allergies: Does not bruise/bleed easily.  Psychiatric/Behavioral: Negative for depression. The patient is not nervous/anxious and does not have insomnia.    PAST MEDICAL HISTORY :  Past Medical History:  Diagnosis Date  . Aneurysm ([place] 2007  . Arthritis   . Cancer ([facility] 2007   diagnosed 01/07 left breast with simple mastectomu & sn bx. The pt had a 3.5cm tumor. Frozen section report on the 2 sn were negative. On permanent sections a 0.520mmicrometastasis was identified. She was not felt to require a complete axillary dissection. She has completed her Tamoxifen therapy and has been released from routine f/u with medical oncology service.  . Diabetes mellitus without complication ([place]  . Hyperlipidemia   . Hypertension    since age 78. [facility]   Mastectomy site recurrence resected 01/26/2014, ER 90%, PR 0%, HER-2/neu nonamplified.  . Other benign neoplasm of connective and other soft tissue of trunk, unspecified 2014  . Personal history of colonic polyps   . Personal history of tobacco use, presenting hazards to health   . Special screening for malignant neoplasms, colon   .  Unspecified disorder of skin and subcutaneous tissue 02/19/2013   biopsy of skin over the sternum on the right breast showed hypertrophic scar,keloid formation.     PAST SURGICAL HISTORY :   Past Surgical History:  Procedure Laterality Date  . ABDOMINAL HYSTERECTOMY  2004   total  . BREAST BIOPSY Right January 26, 2014   Core biopsy for mild increase breast uptake on PET scan, usual ductal hyperplasia and stromal fibrosis  . BREAST SURGERY Left 2007   mastectomy  . chest wall mass  01/26/14  . CHOLECYSTECTOMY  2007  . COLONOSCOPY W/ POLYPECTOMY  2011   Dr. Brynda Greathouse  . EYE SURGERY Right    cataract surgery  . head surgery  1991  . MASTECTOMY Left 2007  . PORT-A-CATH REMOVAL  2008  . PORTACATH PLACEMENT  2007    FAMILY HISTORY :   Family History  Problem Relation Age of Onset  . Cancer Other        ovarian,breast,colon cancers;relations not listed  . Breast cancer Neg Hx     SOCIAL HISTORY:   Social History   Tobacco Use  . Smoking status: Current Some Day Smoker    Packs/day: 1.00    Years: 50.00    Pack years: 50.00    Types: Cigarettes  . Smokeless tobacco: Never Used  Substance Use Topics  . Alcohol use: No    Alcohol/week: 0.0 standard drinks  . Drug use: No    ALLERGIES:  is allergic to metoprolol.  MEDICATIONS:  Current Outpatient Medications  Medication Sig Dispense Refill  . amLODipine (NORVASC) 10 MG tablet TAKE 1 TABLET BY MOUTH EVERY DAY 90 tablet 1  . atorvastatin (LIPITOR) 20 MG tablet Take 20 mg by mouth daily at 6 PM.     . Calcium Carbonate-Vitamin D 600-200 MG-UNIT CAPS Take 200 capsules by mouth once.    . citalopram (CELEXA) 40 MG tablet TAKE 1 TABLET BY MOUTH EVERY DAY 90 tablet 1  . lisinopril (ZESTRIL) 2.5 MG tablet TAKE 1 TABLET BY MOUTH EVERY DAY 90 tablet 1  . VERZENIO 50 MG tablet TAKE 1 TABLET (50 MG TOTAL) BY MOUTH 2 (TWO) TIMES DAILY. 56 tablet 6  . DUREZOL 0.05 % EMUL     . PROLENSA 0.07 % SOLN Place 1 drop into the left eye at bedtime.     No current facility-administered medications for this visit.    Facility-Administered Medications Ordered in Other Visits  Medication Dose Route  Frequency Provider Last Rate Last Dose  . influenza vaccine adjuvanted (FLUAD) injection 0.5 mL  0.5 mL Intramuscular Once [name], MD        PHYSICAL EXAMINATION: ECOG PERFORMANCE STATUS: 0 - Asymptomatic  BP (!) 142/68 (BP Location: Right Arm, Patient Position: Sitting, Cuff Size: Normal)   Pulse 75   Temp 97.7 F (36.5 C) (Tympanic)   Wt 150 lb 6 oz (68.2 kg)   BMI 24.27 kg/m   Filed Weights   11/14/19 1433  Weight: 150 lb 6 oz (68.2 kg)    Physical Exam  Constitutional: She is oriented to person, place, and time and well-developed, well-nourished, and in no distress.    She is walking by self.  HENT:  Head: Normocephalic and atraumatic.  Mouth/Throat: Oropharynx is clear and moist. No oropharyngeal exudate.  Eyes: Pupils are equal, round, and reactive to light.  Neck: Normal range of motion. Neck supple.  Cardiovascular: Normal rate and regular rhythm.  Pulmonary/Chest: No respiratory distress. She has no wheezes.  Abdominal: Soft. Bowel sounds are normal. She exhibits no distension and no mass. There is no abdominal tenderness. There is no rebound and no guarding.  Musculoskeletal: Normal range of motion.        General: No tenderness or edema.  Neurological: She is alert and oriented to person, place, and time.  Skin: Skin is warm.  Psychiatric: Affect normal.   LABORATORY DATA:  I have reviewed the data as listed    Component Value Date/Time   NA 138 11/14/2019 1413   NA 139 05/30/2014 0127   K 3.9 11/14/2019 1413   K 3.7 05/30/2014 0127   CL 106 11/14/2019 1413   CL 106 05/30/2014 0127   CO2 27 11/14/2019 1413   CO2 25 05/30/2014 0127   GLUCOSE 96 11/14/2019 1413   GLUCOSE 136 (H) 05/30/2014 0127   BUN 17 11/14/2019 1413   BUN 19 (H) 05/30/2014 0127   CREATININE 1.26 (H) 11/14/2019 1413   CREATININE 0.98 05/30/2014 0127   CALCIUM 8.6 (L) 11/14/2019 1413   CALCIUM 9.0 05/30/2014 0127   PROT 7.1 11/14/2019 1413   PROT 7.8 05/08/2014 1131    ALBUMIN 3.7 11/14/2019 1413   ALBUMIN 3.7 05/08/2014 1131   AST 17 11/14/2019 1413   AST 20 05/08/2014 1131   ALT 10 11/14/2019 1413   ALT 18 05/08/2014 1131   ALKPHOS 56 11/14/2019 1413   ALKPHOS 58 05/08/2014 1131   BILITOT 0.4 11/14/2019 1413   BILITOT 0.4 05/08/2014 1131   GFRNONAA 41 (L) 11/14/2019 1413   GFRNONAA 58 (L) 05/30/2014 0127   GFRAA 48 (L) 11/14/2019 1413   GFRAA >60 05/30/2014 0127    No results found for: SPEP, UPEP  Lab Results  Component Value Date   WBC 4.7 11/14/2019   NEUTROABS 1.8 11/14/2019   HGB 10.4 (L) 11/14/2019   HCT 33.3 (L) 11/14/2019   MCV 99.1 11/14/2019   PLT 200 11/14/2019      Chemistry      Component Value Date/Time   NA 138 11/14/2019 1413   NA 139 05/30/2014 0127   K 3.9 11/14/2019 1413   K 3.7 05/30/2014 0127   CL 106 11/14/2019 1413   CL 106 05/30/2014 0127   CO2 27 11/14/2019 1413   CO2 25 05/30/2014 0127   BUN 17 11/14/2019 1413   BUN 19 (H) 05/30/2014 0127   CREATININE 1.26 (H) 11/14/2019 1413   CREATININE 0.98 05/30/2014 0127      Component Value Date/Time   CALCIUM 8.6 (L) 11/14/2019 1413   CALCIUM 9.0 05/30/2014 0127   ALKPHOS 56 11/14/2019 1413   ALKPHOS 58 05/08/2014 1131   AST 17 11/14/2019 1413   AST 20 05/08/2014 1131   ALT 10 11/14/2019 1413   ALT 18 05/08/2014 1131   BILITOT 0.4 11/14/2019 1413   BILITOT 0.4 05/08/2014 1131        RADIOGRAPHIC STUDIES: I have personally reviewed the radiological images as listed and agreed with the findings in the report. No results found.   ASSESSMENT & PLAN:   Malignant neoplasm of overlapping sites of left breast in female, estrogen receptor positive ([place]) #Left breast ipsilateral chest wall recurrence stage IV [2015]; currently on Faslodex plus abema. June 2020 PET scan-improved mediastinal adenopathy/right pleural disease.  Tumor marker overall stable in 40s; STABLE.   # Continue Faslodex+ abema 23m BID. On Abema 50 mg BID. Labs today reviewed;   acceptable for treatment today. Will repeat PET scan scan in dec 2020. [Discussed that if  not improved PET scan; would recommend a CT scan]  # Solitary bone lesion-asymptomatic.STABLE.  Hold off Zometa because of dental clearance.  Stable  # HTN- at home 140s.  Continue Norvasc to 10 mg a day Continue lisinopril. Stable.   # CKD- stage III-stable.  I spoke at length with the patient's daughter- regarding the patient's clinical status/plan of care.  Family agreement.   # DISPOSITION:   # shot today # follow up with MD/labs- cbc/cmp/in 4 weeks 27-29; Faslodex;pet scan- Dr.B     [name], MD 11/14/2019 4:03 PM

## 2019-11-14 NOTE — Assessment & Plan Note (Addendum)
#  Left breast ipsilateral chest wall recurrence stage IV [2015]; currently on Faslodex plus abema. June 2020 PET scan-improved mediastinal adenopathy/right pleural disease.  Tumor marker overall stable in 40s; STABLE.   # Continue Faslodex+ abema 50mg  BID. On Abema 50 mg BID. Labs today reviewed;  acceptable for treatment today. Will repeat PET scan scan in dec 2020. [Discussed that if not improved PET scan; would recommend a CT scan]  # Solitary bone lesion-asymptomatic.STABLE.  Hold off Zometa because of dental clearance.  Stable  # HTN- at home 140s.  Continue Norvasc to 10 mg a day Continue lisinopril. Stable.   # CKD- stage III-stable.  I spoke at length with the patient's daughter- regarding the patient's clinical status/plan of care.  Family agreement.   # DISPOSITION:   # shot today # follow up with MD/labs- cbc/cmp/in 4 weeks 27-29; Faslodex;pet scan- Dr.B

## 2019-11-15 LAB — CANCER ANTIGEN 27.29: CA 27.29: 41.6 U/mL — ABNORMAL HIGH (ref 0.0–38.6)

## 2019-12-10 ENCOUNTER — Ambulatory Visit: Payer: Medicare HMO

## 2019-12-11 MED FILL — VERZENIO 50 MG TABS: 50 | 28 days supply | Qty: 56 | Fill #4

## 2019-12-12 ENCOUNTER — Inpatient Hospital Stay: Payer: Medicare HMO

## 2019-12-12 ENCOUNTER — Inpatient Hospital Stay: Payer: Medicare HMO | Admitting: Internal Medicine

## 2019-12-16 ENCOUNTER — Other Ambulatory Visit: Payer: Self-pay

## 2019-12-16 ENCOUNTER — Ambulatory Visit
Admission: RE | Admit: 2019-12-16 | Discharge: 2019-12-16 | Disposition: A | Discharge status: Home / Self Care | Payer: Medicare HMO | Source: Ambulatory Visit | Attending: Internal Medicine | Admitting: Internal Medicine

## 2019-12-16 DIAGNOSIS — I700 Atherosclerosis of aorta: Secondary | ICD-10-CM | POA: Insufficient documentation

## 2019-12-16 DIAGNOSIS — C50812 Malignant neoplasm of overlapping sites of left female breast: Secondary | ICD-10-CM

## 2019-12-16 DIAGNOSIS — Z79899 Other long term (current) drug therapy: Secondary | ICD-10-CM | POA: Insufficient documentation

## 2019-12-16 DIAGNOSIS — I10 Essential (primary) hypertension: Secondary | ICD-10-CM | POA: Diagnosis not present

## 2019-12-16 DIAGNOSIS — J439 Emphysema, unspecified: Secondary | ICD-10-CM | POA: Insufficient documentation

## 2019-12-16 DIAGNOSIS — F17210 Nicotine dependence, cigarettes, uncomplicated: Secondary | ICD-10-CM | POA: Insufficient documentation

## 2019-12-16 DIAGNOSIS — Z5111 Encounter for antineoplastic chemotherapy: Secondary | ICD-10-CM | POA: Diagnosis not present

## 2019-12-16 DIAGNOSIS — E785 Hyperlipidemia, unspecified: Secondary | ICD-10-CM | POA: Insufficient documentation

## 2019-12-16 DIAGNOSIS — Z170 Estrogen receptor positive status [ER+]: Secondary | ICD-10-CM | POA: Insufficient documentation

## 2019-12-16 DIAGNOSIS — C50912 Malignant neoplasm of unspecified site of left female breast: Secondary | ICD-10-CM | POA: Diagnosis not present

## 2019-12-16 LAB — GLUCOSE, CAPILLARY: Glucose-Capillary: 95 mg/dL (ref 70–99)

## 2019-12-16 MED ORDER — FLUDEOXYGLUCOSE F - 18 (FDG) INJECTION
7.8000 | Freq: Once | INTRAVENOUS | Status: AC | PRN
  Administered 2019-12-16: 08:00:00 7.999 via INTRAVENOUS

## 2019-12-16 NOTE — Progress Notes (Signed)
Patient pre screened for office appointment, no questions or concerns today. Patient reminded of upcoming appointment time and date. 

## 2019-12-17 ENCOUNTER — Inpatient Hospital Stay (HOSPITAL_BASED_OUTPATIENT_CLINIC_OR_DEPARTMENT_OTHER): Payer: Medicare HMO | Admitting: Internal Medicine

## 2019-12-17 ENCOUNTER — Inpatient Hospital Stay: Payer: Medicare HMO

## 2019-12-17 ENCOUNTER — Inpatient Hospital Stay: Payer: Medicare HMO | Attending: Internal Medicine

## 2019-12-17 ENCOUNTER — Other Ambulatory Visit: Payer: Self-pay

## 2019-12-17 DIAGNOSIS — C50812 Malignant neoplasm of overlapping sites of left female breast: Secondary | ICD-10-CM

## 2019-12-17 DIAGNOSIS — Z5111 Encounter for antineoplastic chemotherapy: Secondary | ICD-10-CM | POA: Insufficient documentation

## 2019-12-17 DIAGNOSIS — Z170 Estrogen receptor positive status [ER+]: Secondary | ICD-10-CM | POA: Insufficient documentation

## 2019-12-17 LAB — CBC WITH DIFFERENTIAL/PLATELET
Abs Immature Granulocytes: 0.01 10*3/uL (ref 0.00–0.07)
Basophils Absolute: 0 10*3/uL (ref 0.0–0.1)
Basophils Relative: 1 %
Eosinophils Absolute: 0.2 10*3/uL (ref 0.0–0.5)
Eosinophils Relative: 3 %
HCT: 34.4 % — ABNORMAL LOW (ref 36.0–46.0)
Hemoglobin: 10.4 g/dL — ABNORMAL LOW (ref 12.0–15.0)
Immature Granulocytes: 0 %
Lymphocytes Relative: 51 %
Lymphs Abs: 2.8 10*3/uL (ref 0.7–4.0)
MCH: 30.1 pg (ref 26.0–34.0)
MCHC: 30.2 g/dL (ref 30.0–36.0)
MCV: 99.7 fL (ref 80.0–100.0)
Monocytes Absolute: 0.5 10*3/uL (ref 0.1–1.0)
Monocytes Relative: 10 %
Neutro Abs: 1.9 10*3/uL (ref 1.7–7.7)
Neutrophils Relative %: 35 %
Platelets: 207 10*3/uL (ref 150–400)
RBC: 3.45 MIL/uL — ABNORMAL LOW (ref 3.87–5.11)
RDW: 12.9 % (ref 11.5–15.5)
WBC: 5.4 10*3/uL (ref 4.0–10.5)
nRBC: 0 % (ref 0.0–0.2)

## 2019-12-17 LAB — COMPREHENSIVE METABOLIC PANEL
ALT: 11 U/L (ref 0–44)
AST: 19 U/L (ref 15–41)
Albumin: 4.1 g/dL (ref 3.5–5.0)
Alkaline Phosphatase: 62 U/L (ref 38–126)
Anion gap: 8 (ref 5–15)
BUN: 21 mg/dL (ref 8–23)
CO2: 27 mmol/L (ref 22–32)
Calcium: 9 mg/dL (ref 8.9–10.3)
Chloride: 103 mmol/L (ref 98–111)
Creatinine, Ser: 1.49 mg/dL — ABNORMAL HIGH (ref 0.44–1.00)
GFR calc Af Amer: 39 mL/min — ABNORMAL LOW (ref >60)
GFR calc non Af Amer: 34 mL/min — ABNORMAL LOW (ref >60)
Glucose, Bld: 112 mg/dL — ABNORMAL HIGH (ref 70–99)
Potassium: 4.6 mmol/L (ref 3.5–5.1)
Sodium: 138 mmol/L (ref 135–145)
Total Bilirubin: 0.5 mg/dL (ref 0.3–1.2)
Total Protein: 7.7 g/dL (ref 6.5–8.1)

## 2019-12-17 MED ORDER — FULVESTRANT 250 MG/5ML IM SOLN
500.0000 mg | Freq: Once | INTRAMUSCULAR | Status: AC
  Administered 2019-12-17: 15:00:00 500 mg via INTRAMUSCULAR
  Filled 2019-12-17: qty 10

## 2019-12-17 NOTE — Assessment & Plan Note (Addendum)
#  Left breast ipsilateral chest wall recurrence stage IV [2015]; currently on Faslodex plus abema. DEC 22nd 2020 PET scan-improved mediastinal adenopathy/right pleural disease; right iliac lesion.  Tumor marker - improved; stable  # Continue Faslodex+ abema 50mg  BID. On Abema 50 mg BID. Labs today reviewed;  acceptable for treatment today.   # Solitary bone lesion-asymptomatic.  Hold off Zometa because of dental clearance.  Improved on PET scan  # Black stool-hemoglobin stable 10.5.  Not on PO iron; on peptobismol-  recommend stool cardx3; if posistive-recommend GI evaluation.  # HTN- at home 140s.  Continue Norvasc to 10 mg a day Continue lisinopril.  Stable  # CKD- stage III stable  I spoke at length with the patient's daughter- regarding the patient's clinical status/plan of care.  Family agreement.   # DISPOSITION:  Stool cards x3.  # shot today # follow up with MD/labs- cbc/cmp/in 4 weeks 27-29; Faslodex-Dr.B

## 2019-12-18 LAB — CANCER ANTIGEN 27.29: CA 27.29: 38.2 U/mL (ref 0.0–38.6)

## 2019-12-18 NOTE — Progress Notes (Signed)
[facility] OFFICE PROGRESS NOTE  Patient Care Team: Jessica Sails, MD as PCP - General (Internal Medicine) Jessica Compton, Jessica Gleason, MD (General Surgery) Jessica Noble, MD (Internal Medicine) Jessica Sickle, MD as Consulting Physician (Internal Medicine)  Dr.Charles Mayer Compton; [place]  SUMMARY OF ONCOLOGIC HISTORY:  Oncology History Overview Note  # 2007- LEFT BREAST CA STAGE II [T2N6m; s/p mastec; Dr.Byrnett] ER-Pos/PR Neg; Her- 2 Neu- NEG; ACx4-Taxol x12;Femara; stopped May 2009-stopped follow up.  #JAN 2015- Post mastectomy Chest wall recurrence [chest wall mass bx-]; ER- 90%; PR- NEG; Her2 Neu- NEG; s/p excision [involving skeletal muscle/adipose tissue; clear margins]; CT July 2017- NED;   # Jan 2020- progression/ hilar- START abema [jan 14th]+ faslodex[jan 13th]  # OSTEOPENIA [BMD- June 2016]  # Kidney cysts  DIAGNOSIS: LEFT BREAST CA  STAGE: IV- NED  ;GOALS: pallaitive  CURRENT/MOST RECENT THERAPY: faslodex+ Abema    Breast cancer metastasized to skin ([facility]  02/05/2014 Initial Diagnosis   Bre[facility] cancer [facility]asized to skin ([facility]   Malignant neoplasm of overlapping sites of left breast in female, estrogen receptor positive ([facility]  07/24/2016 Initial Diagnosis   Cancer of overlapping sites of left female breast ([facility] [place]   01/06/2019 -  Chemotherapy   The patient had fulvestrant (FASLODEX) injection 500 mg, 500 mg, Intramuscular,  Once, 0 of 4 cycles Dose modification: 500 mg (original dose 500 mg, Cycle 1)  for chemotherapy treatment.     INTERVAL HISTORY: Accompanied by daughter.  A very pleasant 78year old African-American female patient with above history of ER/PR positive breast cancer stage IV currently on Faslodex + abema is here for follow-up/review the PET scan.  Noticed a black-colored stool; recently on Pepto-Bismol.  Otherwise no nausea no vomiting abdominal pain.  No back pain.  No headaches.   Review of Systems   Constitutional: Positive for malaise/fatigue. Negative for chills, diaphoresis, fever and weight loss.  HENT: Negative for nosebleeds and sore throat.   Eyes: Negative for double vision.  Respiratory: Negative for cough, hemoptysis, sputum production, shortness of breath and wheezing.   Cardiovascular: Negative for chest pain, palpitations, orthopnea and leg swelling.  Gastrointestinal: Negative for abdominal pain, blood in stool, constipation, diarrhea, heartburn, melena, nausea and vomiting.  Musculoskeletal: Positive for back pain and joint pain.  Skin: Negative.  Negative for itching and rash.  Neurological: Negative for tingling, focal weakness, weakness and headaches.  Endo/Heme/Allergies: Does not bruise/bleed easily.  Psychiatric/Behavioral: Negative for depression. The patient is not nervous/anxious and does not have insomnia.    PAST MEDICAL HISTORY :  Past Medical History:  Diagnosis Date  . Aneurysm ([facility] 2007  . Arthritis   . [facility]   diagnosed 01/07 left breast with simple mastectomu & sn bx. The pt had a 3.5cm tumor. Frozen section report on the 2 sn were negative. On permanent sections a 0.559mmicrometastasis was identified. She was not felt to require a complete axillary dissection. She has completed her Tamoxifen therapy and has been released from routine f/u with medical oncology service.  . Diabetes mellitus without complication ([facility]  . Hyperlipidemia   . Hypertension    since age 89. [facility]   Mastectomy site recurrence resected 01/26/2014, ER 90%, PR 0%, HER-2/neu nonamplified.  . Other benign neoplasm of connective and other soft tissue of trunk, unspecified 2014  . Personal history of colonic polyps   . Personal history of tobacco use, presenting  hazards to health   . Special screening for malignant neoplasms, colon   . Unspecified disorder of skin and subcutaneous tissue  02/19/2013   biopsy of skin over the sternum on the right breast showed hypertrophic scar,keloid formation.    PAST SURGICAL HISTORY :   Past Surgical History:  Procedure Laterality Date  . ABDOMINAL HYSTERECTOMY  2004   total  . BREAST BIOPSY Right January 26, 2014   Core biopsy for mild increase breast uptake on PET scan, usual ductal hyperplasia and stromal fibrosis  . BREAST SURGERY Left 2007   mastectomy  . chest wall mass  01/26/14  . CHOLECYSTECTOMY  2007  . COLONOSCOPY W/ POLYPECTOMY  2011   Dr. Brynda Jessica Compton  . EYE SURGERY Right    cataract surgery  . head surgery  1991  . MASTECTOMY Left 2007  . PORT-A-CATH REMOVAL  2008  . PORTACATH PLACEMENT  2007    FAMILY HISTORY :   Family History  Problem Relation Age of Onset  . Cancer Other        ovarian,breast,colon cancers;relations not listed  . Breast cancer Neg Hx     SOCIAL HISTORY:   Social History   Tobacco Use  . Smoking status: Current Some Day Smoker    Packs/day: 1.00    Years: 50.00    Pack years: 50.00    Types: Cigarettes  . Smokeless tobacco: Never Used  Substance Use Topics  . Alcohol use: No    Alcohol/week: 0.0 standard drinks  . Drug use: No    ALLERGIES:  is allergic to metoprolol.  MEDICATIONS:  Current Outpatient Medications  Medication Sig Dispense Refill  . amLODipine (NORVASC) 10 MG tablet TAKE 1 TABLET BY MOUTH EVERY DAY 90 tablet 1  . atorvastatin (LIPITOR) 20 MG tablet Take 20 mg by mouth daily at 6 PM.     . Calcium Carbonate-Vitamin D 600-200 MG-UNIT CAPS Take 200 capsules by mouth once.    . citalopram (CELEXA) 40 MG tablet TAKE 1 TABLET BY MOUTH EVERY DAY 90 tablet 1  . DUREZOL 0.05 % EMUL     . lisinopril (ZESTRIL) 2.5 MG tablet TAKE 1 TABLET BY MOUTH EVERY DAY 90 tablet 1  . PROLENSA 0.07 % SOLN Place 1 drop into the left eye at bedtime.    Marland Kitchen VERZENIO 50 MG tablet TAKE 1 TABLET (50 MG TOTAL) BY MOUTH 2 (TWO) TIMES DAILY. 56 tablet 6   No current facility-administered  medications for this visit.    PHYSICAL EXAMINATION: ECOG PERFORMANCE STATUS: 0 - Asymptomatic  BP (!) 142/72 (BP Location: Right Arm, Patient Position: Sitting, Cuff Size: Normal)   Pulse 79   Temp (!) 96.8 F (36 C) (Tympanic)   Wt 152 lb 8 oz (69.2 kg)   BMI 24.61 kg/m   Filed Weights   12/17/19 1358  Weight: 152 lb 8 oz (69.2 kg)    Physical Exam  Constitutional: She is oriented to person, place, and time and well-developed, well-nourished, and in no distress.    She is walking by self.  HENT:  Head: Normocephalic and atraumatic.  Mouth/Throat: Oropharynx is clear and moist. No oropharyngeal exudate.  Eyes: Pupils are equal, round, and reactive to light.  Cardiovascular: Normal rate and regular rhythm.  Pulmonary/Chest: No respiratory distress. She has no wheezes.  Abdominal: Soft. Bowel sounds are normal. She exhibits no distension and no mass. There is no abdominal tenderness. There is no rebound and no guarding.  Musculoskeletal:  General: No tenderness or edema. Normal range of motion.     Cervical back: Normal range of motion and neck supple.  Neurological: She is alert and oriented to person, place, and time.  Skin: Skin is warm.  Psychiatric: Affect normal.   LABORATORY DATA:  I have reviewed the data as listed    Component Value Date/Time   NA 138 12/17/2019 1347   NA 139 05/30/2014 0127   K 4.6 12/17/2019 1347   K 3.7 05/30/2014 0127   CL 103 12/17/2019 1347   CL 106 05/30/2014 0127   CO2 27 12/17/2019 1347   CO2 25 05/30/2014 0127   GLUCOSE 112 (H) 12/17/2019 1347   GLUCOSE 136 (H) 05/30/2014 0127   BUN 21 12/17/2019 1347   BUN 19 (H) 05/30/2014 0127   CREATININE 1.49 (H) 12/17/2019 1347   CREATININE 0.98 05/30/2014 0127   CALCIUM 9.0 12/17/2019 1347   CALCIUM 9.0 05/30/2014 0127   PROT 7.7 12/17/2019 1347   PROT 7.8 05/08/2014 1131   ALBUMIN 4.1 12/17/2019 1347   ALBUMIN 3.7 05/08/2014 1131   AST 19 12/17/2019 1347   AST 20 05/08/2014  1131   ALT 11 12/17/2019 1347   ALT 18 05/08/2014 1131   ALKPHOS 62 12/17/2019 1347   ALKPHOS 58 05/08/2014 1131   BILITOT 0.5 12/17/2019 1347   BILITOT 0.4 05/08/2014 1131   GFRNONAA 34 (L) 12/17/2019 1347   GFRNONAA 58 (L) 05/30/2014 0127   GFRAA 39 (L) 12/17/2019 1347   GFRAA >60 05/30/2014 0127    No results found for: SPEP, UPEP  Lab Results  Component Value Date   WBC 5.4 12/17/2019   NEUTROABS 1.9 12/17/2019   HGB 10.4 (L) 12/17/2019   HCT 34.4 (L) 12/17/2019   MCV 99.7 12/17/2019   PLT 207 12/17/2019      Chemistry      Component Value Date/Time   NA 138 12/17/2019 1347   NA 139 05/30/2014 0127   K 4.6 12/17/2019 1347   K 3.7 05/30/2014 0127   CL 103 12/17/2019 1347   CL 106 05/30/2014 0127   CO2 27 12/17/2019 1347   CO2 25 05/30/2014 0127   BUN 21 12/17/2019 1347   BUN 19 (H) 05/30/2014 0127   CREATININE 1.49 (H) 12/17/2019 1347   CREATININE 0.98 05/30/2014 0127      Component Value Date/Time   CALCIUM 9.0 12/17/2019 1347   CALCIUM 9.0 05/30/2014 0127   ALKPHOS 62 12/17/2019 1347   ALKPHOS 58 05/08/2014 1131   AST 19 12/17/2019 1347   AST 20 05/08/2014 1131   ALT 11 12/17/2019 1347   ALT 18 05/08/2014 1131   BILITOT 0.5 12/17/2019 1347   BILITOT 0.4 05/08/2014 1131        RADIOGRAPHIC STUDIES: I have personally reviewed the radiological images as listed and agreed with the findings in the report. NM PET Image Restag (PS) Skull Base To Thigh  Result Date: 12/16/2019 CLINICAL DATA:  Subsequent treatment strategy for recurrent left breast cancer with ongoing oral chemotherapy. History of left mastectomy in 2007. EXAM: NUCLEAR MEDICINE PET SKULL BASE TO THIGH TECHNIQUE: 8.0 mCi F-18 FDG was injected intravenously. Full-ring PET imaging was performed from the skull base to thigh after the radiotracer. CT data was obtained and used for attenuation correction and anatomic localization. Fasting blood glucose: 95 mg/dl COMPARISON:  06/09/2019 PET-CT.  FINDINGS: Mediastinal blood pool activity: SUV max 2.7 Liver activity: SUV max NA NECK: No hypermetabolic lymph nodes in the neck. Incidental CT  findings: none CHEST: No enlarged or hypermetabolic axillary, mediastinal or hilar lymph nodes. Previously noted mild hypermetabolism within subcarinal and right infrahilar nodes has resolved. No hypermetabolic pulmonary findings. Previously noted low level uptake within subpleural medial right lung base pulmonary nodules has resolved and these subpleural nodules are stable or resolved in size. Representative 0.3 cm posterior basilar right lower lobe pulmonary nodule is stable (series 4/image 115). Previously visualized 1.5 cm basilar right lower lobe subpleural nodule has resolved. Incidental CT findings: Coronary atherosclerosis. Atherosclerotic nonaneurysmal thoracic aorta. Moderate centrilobular emphysema. No acute consolidative airspace disease or new significant pulmonary nodules. Stable minimal radiation fibrosis in the anterior left upper lobe. Left mastectomy. ABDOMEN/PELVIS: No abnormal hypermetabolic activity within the liver, pancreas, adrenal glands, or spleen. No hypermetabolic lymph nodes in the abdomen or pelvis. Incidental CT findings: Cholecystectomy. Simple 2.4 cm medial lower left renal cyst. Atherosclerotic nonaneurysmal abdominal aorta. Mild diffuse colonic diverticulosis. Hysterectomy. SKELETON: No focal hypermetabolic activity to suggest skeletal metastasis. No residual uptake at the small faintly sclerotic lesions in the left superior iliac wing (series 4/image 176) or medial right iliac bone (series 4/image 191). Incidental CT findings: none IMPRESSION: 1. Complete metabolic response. No new or residual metabolically active metastatic disease. 2. Aortic Atherosclerosis (ICD10-I70.0) and Emphysema (ICD10-J43.9). Electronically Signed   By: Ilona Sorrel M.D.   On: 12/16/2019 12:20     ASSESSMENT & PLAN:   Malignant neoplasm of overlapping sites  of left breast in female, estrogen receptor positive ([place]) #Left breast ipsilateral chest wall recurrence stage IV [2015]; currently on Faslodex plus abema. DEC 22nd 2020 PET scan-improved mediastinal adenopathy/right pleural disease; right iliac lesion.  Tumor marker - improved; stable  # Continue Faslodex+ abema 40m BID. On Abema 50 mg BID. Labs today reviewed;  acceptable for treatment today.   # Solitary bone lesion-asymptomatic.  Hold off Zometa because of dental clearance.  Improved on PET scan  # Black stool- not on PO iron; on peptobismol-  recommend stool cardx3; if posistive-recommend GI evaluation.  # HTN- at home 140s.  Continue Norvasc to 10 mg a day Continue lisinopril.  Stable  # CKD- stage III stable  I spoke at length with the patient's daughter- regarding the patient's clinical status/plan of care.  Family agreement.   # DISPOSITION:  Stool cards x3.  # shot today # follow up with MD/labs- cbc/cmp/in 4 weeks 27-29; Faslodex-Dr.B     GCammie Sickle MD 12/18/2019 7:30 AM

## 2020-01-09 ENCOUNTER — Other Ambulatory Visit: Payer: Self-pay | Admitting: *Deleted

## 2020-01-12 MED ORDER — CITALOPRAM HYDROBROMIDE 40 MG PO TABS: 40.0000 mg | ORAL_TABLET | Freq: Every day | ORAL | 1 refills | Status: DC

## 2020-01-15 ENCOUNTER — Inpatient Hospital Stay (HOSPITAL_BASED_OUTPATIENT_CLINIC_OR_DEPARTMENT_OTHER): Payer: Medicare HMO | Admitting: Internal Medicine

## 2020-01-15 ENCOUNTER — Inpatient Hospital Stay: Payer: Medicare HMO

## 2020-01-15 ENCOUNTER — Inpatient Hospital Stay: Payer: Medicare HMO | Attending: Internal Medicine

## 2020-01-15 ENCOUNTER — Other Ambulatory Visit: Payer: Self-pay

## 2020-01-15 DIAGNOSIS — Z170 Estrogen receptor positive status [ER+]: Secondary | ICD-10-CM

## 2020-01-15 DIAGNOSIS — C50812 Malignant neoplasm of overlapping sites of left female breast: Secondary | ICD-10-CM

## 2020-01-15 DIAGNOSIS — E119 Type 2 diabetes mellitus without complications: Secondary | ICD-10-CM | POA: Diagnosis not present

## 2020-01-15 DIAGNOSIS — Z5111 Encounter for antineoplastic chemotherapy: Secondary | ICD-10-CM | POA: Insufficient documentation

## 2020-01-15 LAB — COMPREHENSIVE METABOLIC PANEL
ALT: 11 U/L (ref 0–44)
AST: 19 U/L (ref 15–41)
Albumin: 4.1 g/dL (ref 3.5–5.0)
Alkaline Phosphatase: 60 U/L (ref 38–126)
Anion gap: 9 (ref 5–15)
BUN: 17 mg/dL (ref 8–23)
CO2: 26 mmol/L (ref 22–32)
Calcium: 9.4 mg/dL (ref 8.9–10.3)
Chloride: 103 mmol/L (ref 98–111)
Creatinine, Ser: 1.18 mg/dL — ABNORMAL HIGH (ref 0.44–1.00)
GFR calc Af Amer: 52 mL/min — ABNORMAL LOW (ref >60)
GFR calc non Af Amer: 44 mL/min — ABNORMAL LOW (ref >60)
Glucose, Bld: 121 mg/dL — ABNORMAL HIGH (ref 70–99)
Potassium: 4 mmol/L (ref 3.5–5.1)
Sodium: 138 mmol/L (ref 135–145)
Total Bilirubin: 0.8 mg/dL (ref 0.3–1.2)
Total Protein: 8 g/dL (ref 6.5–8.1)

## 2020-01-15 LAB — CBC WITH DIFFERENTIAL/PLATELET
Abs Immature Granulocytes: 0.01 10*3/uL (ref 0.00–0.07)
Basophils Absolute: 0 10*3/uL (ref 0.0–0.1)
Basophils Relative: 1 %
Eosinophils Absolute: 0.3 10*3/uL (ref 0.0–0.5)
Eosinophils Relative: 5 %
HCT: 36.1 % (ref 36.0–46.0)
Hemoglobin: 11.1 g/dL — ABNORMAL LOW (ref 12.0–15.0)
Immature Granulocytes: 0 %
Lymphocytes Relative: 44 %
Lymphs Abs: 2.3 10*3/uL (ref 0.7–4.0)
MCH: 30.7 pg (ref 26.0–34.0)
MCHC: 30.7 g/dL (ref 30.0–36.0)
MCV: 99.7 fL (ref 80.0–100.0)
Monocytes Absolute: 0.4 10*3/uL (ref 0.1–1.0)
Monocytes Relative: 8 %
Neutro Abs: 2.2 10*3/uL (ref 1.7–7.7)
Neutrophils Relative %: 42 %
Platelets: 201 10*3/uL (ref 150–400)
RBC: 3.62 MIL/uL — ABNORMAL LOW (ref 3.87–5.11)
RDW: 13.7 % (ref 11.5–15.5)
WBC: 5.2 10*3/uL (ref 4.0–10.5)
nRBC: 0 % (ref 0.0–0.2)

## 2020-01-15 MED ORDER — FULVESTRANT 250 MG/5ML IM SOLN
500.0000 mg | Freq: Once | INTRAMUSCULAR | Status: AC
  Administered 2020-01-15: 500 mg via INTRAMUSCULAR
  Filled 2020-01-15: qty 10

## 2020-01-15 MED FILL — VERZENIO 50 MG TABS: 50 | 28 days supply | Qty: 56 | Fill #5

## 2020-01-15 NOTE — Patient Instructions (Signed)
For more information/scheduling recommend call [place] [facility] department- [phone], 8:30am-4:30pm.  

## 2020-01-15 NOTE — Assessment & Plan Note (Addendum)
#  Left breast ipsilateral chest wall recurrence stage IV [2015]; currently on Faslodex plus abema. DEC 22nd 2020 PET scan-improved mediastinal adenopathy/right pleural disease; right iliac lesion.  Tumor markers stable.  Clinically stable.  # Continue Faslodex+ abema 50mg  BID. On Abema 50 mg BID. Labs today reviewed;  acceptable for treatment today.   # Solitary bone lesion-asymptomatic; hold Zometa.  Stable.  #Mild anemia hemoglobin 11.  Stable.  Stool occult not done.  Given again today.  # HTN- at home 140s.  Continue Norvasc to 10 mg a day Continue lisinopril.  Stable.   # CKD- stage III stable  I spoke at length with the patient's daughter- regarding the patient's clinical status/plan of care.  Family agreement.   # # I discussed regarding Covid-19 precautions.  I reviewed the vaccine effectiveness and potential side effects in detail.  Also discussed long-term effectiveness and safety profile are unclear at this time.  I discussed December, 2020 [employer] position statement-that all patients are recommended COVID-19 vaccinations [when available]-as long as they do not have allergy to components of the vaccine.  However, I think the benefits of the vaccination outweigh the potential risks. Re: [id number] vaccination.  For more information/scheduling recommend call [place] [phone], 8:30am-4:30pm.   # DISPOSITION:  Stool cards x3.  # shot today # follow up with MD/labs- cbc/cmp/in 4 weeks 27-29; Faslodex-Dr.B

## 2020-01-15 NOTE — Progress Notes (Signed)
[place] OFFICE PROGRESS NOTE  Patient Care Team: [name], MD as PCP - General (Internal Medicine) Bary Castilla, Forest Gleason, MD (General Surgery) Marden Noble, MD (Internal Medicine) [name], MD as Consulting Physician (Internal Medicine)  Dr.Charles Mayer Masker; [place]  SUMMARY OF ONCOLOGIC HISTORY:  Oncology History Overview Note  # 2007- LEFT BREAST CA STAGE II [T2N1m; s/p mastec; Dr.Byrnett] ER-Pos/PR Neg; Her- 2 Neu- NEG; ACx4-Taxol x12;Femara; stopped May 2009-stopped follow up.  #JAN 2015- Post mastectomy Chest wall recurrence [chest wall mass bx-]; ER- 90%; PR- NEG; Her2 Neu- NEG; s/p excision [involving skeletal muscle/adipose tissue; clear margins]; CT July 2017- NED;   # Jan 2020- progression/ hilar- START abema [jan 14th]+ faslodex[jan 13th]  # OSTEOPENIA [BMD- June 2016]  # Kidney cysts  DIAGNOSIS: LEFT BREAST CA  STAGE: IV- NED  ;GOALS: pallaitive  CURRENT/MOST RECENT THERAPY: faslodex+ Abema    Breast cancer metastasized to skin ([facility]  02/05/2014 Initial Diagnosis   Breast cancer metastasized to skin ([facility]   Malignant neoplasm of overlapping sites of left breast in female, estrogen receptor positive ([facility]  07/24/2016 Initial Diagnosis   Cancer of overlapping sites of left female breast ([facility]   01/06/2019 -  Chemotherapy   The patient had fulvestrant (FASLODEX) injection 500 mg, 500 mg, Intramuscular,  Once, 0 of 4 cycles Dose modification: 500 mg (original dose 500 mg, Cycle 1)  for chemotherapy treatment.     INTERVAL HISTORY: Accompanied by daughter.  A very pleasant 89year old African-American female patient with above history of ER/PR positive breast cancer stage IV currently on Faslodex + abema is here for follow-up.  Patient denies any blood in stools.  No black or stools.  Nausea no vomiting.  No Abdominal pain.   Review of Systems  Constitutional: Positive for malaise/fatigue. Negative for chills,  diaphoresis, fever and weight loss.  HENT: Negative for nosebleeds and sore throat.   Eyes: Negative for double vision.  Respiratory: Negative for cough, hemoptysis, sputum production, shortness of breath and wheezing.   Cardiovascular: Negative for chest pain, palpitations, orthopnea and leg swelling.  Gastrointestinal: Negative for abdominal pain, blood in stool, constipation, diarrhea, heartburn, melena, nausea and vomiting.  Musculoskeletal: Positive for back pain and joint pain.  Skin: Negative.  Negative for itching and rash.  Neurological: Negative for tingling, focal weakness, weakness and headaches.  Endo/Heme/Allergies: Does not bruise/bleed easily.  Psychiatric/Behavioral: Negative for depression. The patient is not nervous/anxious and does not have insomnia.    PAST MEDICAL HISTORY :  Past Medical History:  Diagnosis Date  . Aneurysm ([facility] 2007  . Arthritis   . Cancer ([facility] 2007   diagnosed 01/07 left breast with simple mastectomu & sn bx. The pt had a 3.5cm tumor. Frozen section report on the 2 sn were negative. On permanent sections a 0.521mmicrometastasis was identified. She was not felt to require a complete axillary dissection. She has completed her Tamoxifen therapy and has been released from routine f/u with medical oncology service.  . Diabetes mellitus without complication ([facility]  . Hyperlipidemia   . Hypertension    since age 79. [facility]   Mastectomy site recurrence resected 01/26/2014, ER 90%, PR 0%, HER-2/neu nonamplified.  . Other benign neoplasm of connective and other soft tissue of trunk, unspecified 2014  . Personal history of colonic polyps   . Personal history of tobacco use, presenting hazards to health   .  Special screening for malignant neoplasms, colon   . Unspecified disorder of skin and subcutaneous tissue 02/19/2013   biopsy of skin over the sternum on the right breast showed  hypertrophic scar,keloid formation.    PAST SURGICAL HISTORY :   Past Surgical History:  Procedure Laterality Date  . ABDOMINAL HYSTERECTOMY  2004   total  . BREAST BIOPSY Right January 26, 2014   Core biopsy for mild increase breast uptake on PET scan, usual ductal hyperplasia and stromal fibrosis  . BREAST SURGERY Left 2007   mastectomy  . chest wall mass  01/26/14  . CHOLECYSTECTOMY  2007  . COLONOSCOPY W/ POLYPECTOMY  2011   Dr. Brynda Greathouse  . EYE SURGERY Right    cataract surgery  . head surgery  1991  . MASTECTOMY Left 2007  . PORT-A-CATH REMOVAL  2008  . PORTACATH PLACEMENT  2007    FAMILY HISTORY :   Family History  Problem Relation Age of Onset  . Cancer Other        ovarian,breast,colon cancers;relations not listed  . Breast cancer Neg Hx     SOCIAL HISTORY:   Social History   Tobacco Use  . Smoking status: Current Some Day Smoker    Packs/day: 1.00    Years: 50.00    Pack years: 50.00    Types: Cigarettes  . Smokeless tobacco: Never Used  Substance Use Topics  . Alcohol use: No    Alcohol/week: 0.0 standard drinks  . Drug use: No    ALLERGIES:  is allergic to metoprolol.  MEDICATIONS:  Current Outpatient Medications  Medication Sig Dispense Refill  . amLODipine (NORVASC) 10 MG tablet TAKE 1 TABLET BY MOUTH EVERY DAY 90 tablet 1  . atorvastatin (LIPITOR) 20 MG tablet Take 20 mg by mouth daily at 6 PM.     . Calcium Carbonate-Vitamin D 600-200 MG-UNIT CAPS Take 200 capsules by mouth once.    . citalopram (CELEXA) 40 MG tablet Take 1 tablet (40 mg total) by mouth daily. 90 tablet 1  . DUREZOL 0.05 % EMUL     . lisinopril (ZESTRIL) 2.5 MG tablet TAKE 1 TABLET BY MOUTH EVERY DAY 90 tablet 1  . VERZENIO 50 MG tablet TAKE 1 TABLET (50 MG TOTAL) BY MOUTH 2 (TWO) TIMES DAILY. 56 tablet 6  . PROLENSA 0.07 % SOLN Place 1 drop into the left eye at bedtime.     No current facility-administered medications for this visit.    PHYSICAL EXAMINATION: ECOG  PERFORMANCE STATUS: 0 - Asymptomatic  BP (!) 147/69 (BP Location: Right Arm, Patient Position: Sitting, Cuff Size: Normal)   Pulse 86   Temp 98.3 F (36.8 C) (Tympanic)   Wt 151 lb (68.5 kg)   BMI 24.37 kg/m   Filed Weights   01/15/20 1119  Weight: 151 lb (68.5 kg)    Physical Exam  Constitutional: She is oriented to person, place, and time and well-developed, well-nourished, and in no distress.    She is walking by self.  HENT:  Head: Normocephalic and atraumatic.  Mouth/Throat: Oropharynx is clear and moist. No oropharyngeal exudate.  Eyes: Pupils are equal, round, and reactive to light.  Cardiovascular: Normal rate and regular rhythm.  Pulmonary/Chest: No respiratory distress. She has no wheezes.  Abdominal: Soft. Bowel sounds are normal. She exhibits no distension and no mass. There is no abdominal tenderness. There is no rebound and no guarding.  Musculoskeletal:        General: No tenderness or edema. Normal range  of motion.     Cervical back: Normal range of motion and neck supple.  Neurological: She is alert and oriented to person, place, and time.  Skin: Skin is warm.  Psychiatric: Affect normal.   LABORATORY DATA:  I have reviewed the data as listed    Component Value Date/Time   NA 138 01/15/2020 1035   NA 139 05/30/2014 0127   K 4.0 01/15/2020 1035   K 3.7 05/30/2014 0127   CL 103 01/15/2020 1035   CL 106 05/30/2014 0127   CO2 26 01/15/2020 1035   CO2 25 05/30/2014 0127   GLUCOSE 121 (H) 01/15/2020 1035   GLUCOSE 136 (H) 05/30/2014 0127   BUN 17 01/15/2020 1035   BUN 19 (H) 05/30/2014 0127   CREATININE 1.18 (H) 01/15/2020 1035   CREATININE 0.98 05/30/2014 0127   CALCIUM 9.4 01/15/2020 1035   CALCIUM 9.0 05/30/2014 0127   PROT 8.0 01/15/2020 1035   PROT 7.8 05/08/2014 1131   ALBUMIN 4.1 01/15/2020 1035   ALBUMIN 3.7 05/08/2014 1131   AST 19 01/15/2020 1035   AST 20 05/08/2014 1131   ALT 11 01/15/2020 1035   ALT 18 05/08/2014 1131   ALKPHOS 60  01/15/2020 1035   ALKPHOS 58 05/08/2014 1131   BILITOT 0.8 01/15/2020 1035   BILITOT 0.4 05/08/2014 1131   GFRNONAA 44 (L) 01/15/2020 1035   GFRNONAA 58 (L) 05/30/2014 0127   GFRAA 52 (L) 01/15/2020 1035   GFRAA >60 05/30/2014 0127    No results found for: SPEP, UPEP  Lab Results  Component Value Date   WBC 5.2 01/15/2020   NEUTROABS 2.2 01/15/2020   HGB 11.1 (L) 01/15/2020   HCT 36.1 01/15/2020   MCV 99.7 01/15/2020   PLT 201 01/15/2020      Chemistry      Component Value Date/Time   NA 138 01/15/2020 1035   NA 139 05/30/2014 0127   K 4.0 01/15/2020 1035   K 3.7 05/30/2014 0127   CL 103 01/15/2020 1035   CL 106 05/30/2014 0127   CO2 26 01/15/2020 1035   CO2 25 05/30/2014 0127   BUN 17 01/15/2020 1035   BUN 19 (H) 05/30/2014 0127   CREATININE 1.18 (H) 01/15/2020 1035   CREATININE 0.98 05/30/2014 0127      Component Value Date/Time   CALCIUM 9.4 01/15/2020 1035   CALCIUM 9.0 05/30/2014 0127   ALKPHOS 60 01/15/2020 1035   ALKPHOS 58 05/08/2014 1131   AST 19 01/15/2020 1035   AST 20 05/08/2014 1131   ALT 11 01/15/2020 1035   ALT 18 05/08/2014 1131   BILITOT 0.8 01/15/2020 1035   BILITOT 0.4 05/08/2014 1131        RADIOGRAPHIC STUDIES: I have personally reviewed the radiological images as listed and agreed with the findings in the report. No results found.   ASSESSMENT & PLAN:   Malignant neoplasm of overlapping sites of left breast in female, estrogen receptor positive ([place]) #Left breast ipsilateral chest wall recurrence stage IV [2015]; currently on Faslodex plus abema. DEC 22nd 2020 PET scan-improved mediastinal adenopathy/right pleural disease; right iliac lesion.  Tumor markers stable.  Clinically stable.  # Continue Faslodex+ abema 46m BID. On Abema 50 mg BID. Labs today reviewed;  acceptable for treatment today.   # Solitary bone lesion-asymptomatic; hold Zometa.  Stable.  #Mild anemia hemoglobin 11.  Stable.  Stool occult not done.  Given again  today.  # HTN- at home 140s.  Continue Norvasc to 10 mg a  day Continue lisinopril.  Stable.   # CKD- stage III stable  I spoke at length with the patient's daughter- regarding the patient's clinical status/plan of care.  Family agreement.   # # I discussed regarding Covid-19 precautions.  I reviewed the vaccine effectiveness and potential side effects in detail.  Also discussed long-term effectiveness and safety profile are unclear at this time.  I discussed December, 2020 [employer] position statement-that all patients are recommended COVID-19 vaccinations [when available]-as long as they do not have allergy to components of the vaccine.  However, I think the benefits of the vaccination outweigh the potential risks. Re: [id number] vaccination.  For more information/scheduling recommend call [facility] [phone], 8:30am-4:30pm.   # DISPOSITION:  Stool cards x3.  # shot today # follow up with MD/labs- cbc/cmp/in 4 weeks 27-29; Faslodex-Dr.B     [name], MD 01/16/2020 7:30 AM

## 2020-01-16 LAB — CANCER ANTIGEN 27.29: CA 27.29: 40.1 U/mL — ABNORMAL HIGH (ref 0.0–38.6)

## 2020-01-27 ENCOUNTER — Other Ambulatory Visit: Payer: Self-pay | Admitting: *Deleted

## 2020-01-27 MED ORDER — LISINOPRIL 2.5 MG PO TABS: 2.5000 mg | ORAL_TABLET | Freq: Every day | ORAL | 1 refills | Status: DC

## 2020-02-12 ENCOUNTER — Inpatient Hospital Stay: Payer: Medicare HMO

## 2020-02-12 ENCOUNTER — Inpatient Hospital Stay: Payer: Medicare HMO | Admitting: Internal Medicine

## 2020-02-16 ENCOUNTER — Other Ambulatory Visit: Payer: Self-pay

## 2020-02-16 ENCOUNTER — Inpatient Hospital Stay: Payer: Medicare HMO | Attending: Internal Medicine | Admitting: Internal Medicine

## 2020-02-16 ENCOUNTER — Encounter: Payer: Self-pay | Admitting: Internal Medicine

## 2020-02-16 ENCOUNTER — Inpatient Hospital Stay: Payer: Medicare HMO

## 2020-02-16 DIAGNOSIS — C50812 Malignant neoplasm of overlapping sites of left female breast: Secondary | ICD-10-CM | POA: Insufficient documentation

## 2020-02-16 DIAGNOSIS — Z170 Estrogen receptor positive status [ER+]: Secondary | ICD-10-CM | POA: Insufficient documentation

## 2020-02-16 DIAGNOSIS — Z5111 Encounter for antineoplastic chemotherapy: Secondary | ICD-10-CM | POA: Diagnosis not present

## 2020-02-16 DIAGNOSIS — Z79899 Other long term (current) drug therapy: Secondary | ICD-10-CM | POA: Insufficient documentation

## 2020-02-16 LAB — CBC WITH DIFFERENTIAL/PLATELET
Abs Immature Granulocytes: 0.02 10*3/uL (ref 0.00–0.07)
Basophils Absolute: 0.1 10*3/uL (ref 0.0–0.1)
Basophils Relative: 1 %
Eosinophils Absolute: 0.2 10*3/uL (ref 0.0–0.5)
Eosinophils Relative: 4 %
HCT: 34.8 % — ABNORMAL LOW (ref 36.0–46.0)
Hemoglobin: 10.6 g/dL — ABNORMAL LOW (ref 12.0–15.0)
Immature Granulocytes: 0 %
Lymphocytes Relative: 49 %
Lymphs Abs: 2.3 10*3/uL (ref 0.7–4.0)
MCH: 30.4 pg (ref 26.0–34.0)
MCHC: 30.5 g/dL (ref 30.0–36.0)
MCV: 99.7 fL (ref 80.0–100.0)
Monocytes Absolute: 0.4 10*3/uL (ref 0.1–1.0)
Monocytes Relative: 7 %
Neutro Abs: 1.9 10*3/uL (ref 1.7–7.7)
Neutrophils Relative %: 39 %
Platelets: 194 10*3/uL (ref 150–400)
RBC: 3.49 MIL/uL — ABNORMAL LOW (ref 3.87–5.11)
RDW: 13.6 % (ref 11.5–15.5)
WBC: 4.8 10*3/uL (ref 4.0–10.5)
nRBC: 0 % (ref 0.0–0.2)

## 2020-02-16 LAB — COMPREHENSIVE METABOLIC PANEL
ALT: 11 U/L (ref 0–44)
AST: 22 U/L (ref 15–41)
Albumin: 4 g/dL (ref 3.5–5.0)
Alkaline Phosphatase: 52 U/L (ref 38–126)
Anion gap: 10 (ref 5–15)
BUN: 17 mg/dL (ref 8–23)
CO2: 26 mmol/L (ref 22–32)
Calcium: 9.1 mg/dL (ref 8.9–10.3)
Chloride: 104 mmol/L (ref 98–111)
Creatinine, Ser: 1.08 mg/dL — ABNORMAL HIGH (ref 0.44–1.00)
GFR calc Af Amer: 57 mL/min — ABNORMAL LOW (ref >60)
GFR calc non Af Amer: 49 mL/min — ABNORMAL LOW (ref >60)
Glucose, Bld: 115 mg/dL — ABNORMAL HIGH (ref 70–99)
Potassium: 3.7 mmol/L (ref 3.5–5.1)
Sodium: 140 mmol/L (ref 135–145)
Total Bilirubin: 0.5 mg/dL (ref 0.3–1.2)
Total Protein: 7.7 g/dL (ref 6.5–8.1)

## 2020-02-16 MED ORDER — FULVESTRANT 250 MG/5ML IM SOLN
500.0000 mg | Freq: Once | INTRAMUSCULAR | Status: AC
  Administered 2020-02-16: 500 mg via INTRAMUSCULAR
  Filled 2020-02-16: qty 10

## 2020-02-16 NOTE — Assessment & Plan Note (Addendum)
#  Left breast ipsilateral chest wall recurrence stage IV [2015]; currently on Faslodex plus abema. DEC 22nd 2020 PET scan-improved mediastinal adenopathy/right pleural disease; right iliac lesion.  Tumor markers stable.  Clinically stable-except for bilateral hip pain [see below]  # Continue Faslodex+ abema 50mg  BID. On Abema 50 mg BID. Labs today reviewed;  acceptable for treatment today.   #Bilateral hip pain-likely arthritis- unlikely progressive disease given complete response on PET scan 2 months ago.  Discussed that would recommend imaging/x-rays if pain gets worse.  Continue NSAIDs for now  # Mild anemia hemoglobin 106-11; STABLE. Marland Kitchen  Stool occult - not done again today.  Reminded patient to get the stool cards done  # HTN- at home 140-150ss.  Continue Norvasc to 10 mg a day Continue lisinopril.  Stable.   # CKD- stage III stable  # DISPOSITION:  # shot today # follow up with MD/labs- cbc/cmp/in 4 weeks 27-29; Faslodex-Dr.B

## 2020-02-16 NOTE — Progress Notes (Signed)
[place] OFFICE PROGRESS NOTE  Patient Care Team: [name], MD as PCP - General (Internal Medicine) Bary Castilla, Forest Gleason, MD (General Surgery) Marden Noble, MD (Internal Medicine) [name], MD as Consulting Physician (Internal Medicine)  Dr.Charles Mayer Masker; [place]  SUMMARY OF ONCOLOGIC HISTORY:  Oncology History Overview Note  # 2007- LEFT BREAST CA STAGE II [T2N39m; s/p mastec; Dr.Byrnett] ER-Pos/PR Neg; Her- 2 Neu- NEG; ACx4-Taxol x12;Femara; stopped May 2009-stopped follow up.  #JAN 2015- Post mastectomy Chest wall recurrence [chest wall mass bx-]; ER- 90%; PR- NEG; Her2 Neu- NEG; s/p excision [involving skeletal muscle/adipose tissue; clear margins]; CT July 2017- NED;   # Jan 2020- progression/ hilar- START abema [jan 14th]+ faslodex[jan 13th]  # OSTEOPENIA [BMD- June 2016]  # Kidney cysts  DIAGNOSIS: LEFT BREAST CA  STAGE: IV- NED  ;GOALS: pallaitive  CURRENT/MOST RECENT THERAPY: faslodex+ Abema    Breast cancer metastasized to skin ([facility]  02/05/2014 Initial Diagnosis   Breast cancer metastasized to skin ([facility]   Malignant neoplasm of overlapping sites of left breast in female, estrogen receptor positive ([facility] [place]  07/24/2016 Initial Diagnosis   Cancer of overlapping sites of left female breast ([place]   01/06/2019 -  Chemotherapy   The patient had fulvestrant (FASLODEX) injection 500 mg, 500 mg, Intramuscular,  Once, 0 of 4 cycles Dose modification: 500 mg (original dose 500 mg, Cycle 1)  for chemotherapy treatment.     INTERVAL HISTORY: Accompanied by daughter.  A very pleasant 89year old African-American female patient with above history of ER/PR positive breast cancer stage IV currently on Faslodex + abema is here for follow-up.  Patient notes to have pain bilateral hips in the last 4 weeks.  Is been worse with movement.  Radiation to bilateral lower extremities.  Improved with Tylenol arthritis.  No pain with  else.   Review of Systems  Constitutional: Positive for malaise/fatigue. Negative for chills, diaphoresis, fever and weight loss.  HENT: Negative for nosebleeds and sore throat.   Eyes: Negative for double vision.  Respiratory: Negative for cough, hemoptysis, sputum production, shortness of breath and wheezing.   Cardiovascular: Negative for chest pain, palpitations, orthopnea and leg swelling.  Gastrointestinal: Negative for abdominal pain, blood in stool, constipation, diarrhea, heartburn, melena, nausea and vomiting.  Musculoskeletal: Positive for back pain and joint pain.  Skin: Negative.  Negative for itching and rash.  Neurological: Negative for tingling, focal weakness, weakness and headaches.  Endo/Heme/Allergies: Does not bruise/bleed easily.  Psychiatric/Behavioral: Negative for depression. The patient is not nervous/anxious and does not have insomnia.    PAST MEDICAL HISTORY :  Past Medical History:  Diagnosis Date  . [place]neurysm ([facility] [place]  . Arthritis   . Cancer ([facility] 2007   diagnosed 01/07 left breast with simple mastectomu & sn bx. The pt had a 3.5cm tumor. Frozen section report on the 2 sn were negative. On permanent sections a 0.564mmicrometastasis was identified. She was not felt to require a complete axillary dissection. She has completed her Tamoxifen therapy and has been released from routine f/u with medical oncology service.  . Diabetes mellitus without complication ([facility]  . Hyperlipidemia   . Hypertension    since age 79. [facility]   Mastectomy site recurrence resected 01/26/2014, ER 90%, PR 0%, HER-2/neu nonamplified.  . Other benign neoplasm of connective and other soft tissue of trunk, unspecified 2014  . Personal history of colonic polyps   .  Personal history of tobacco use, presenting hazards to health   . Special screening for malignant neoplasms, colon   . Unspecified disorder of skin  and subcutaneous tissue 02/19/2013   biopsy of skin over the sternum on the right breast showed hypertrophic scar,keloid formation.    PAST SURGICAL HISTORY :   Past Surgical History:  Procedure Laterality Date  . ABDOMINAL HYSTERECTOMY  2004   total  . BREAST BIOPSY Right January 26, 2014   Core biopsy for mild increase breast uptake on PET scan, usual ductal hyperplasia and stromal fibrosis  . BREAST SURGERY Left 2007   mastectomy  . chest wall mass  01/26/14  . CHOLECYSTECTOMY  2007  . COLONOSCOPY W/ POLYPECTOMY  2011   Dr. Eason  . EYE SURGERY Right    cataract surgery  . head surgery  1991  . MASTECTOMY Left 2007  . PORT-A-CATH REMOVAL  2008  . PORTACATH PLACEMENT  2007    FAMILY HISTORY :   Family History  Problem Relation Age of Onset  . Cancer Other        ovarian,breast,colon cancers;relations not listed  . Breast cancer Neg Hx     SOCIAL HISTORY:   Social History   Tobacco Use  . Smoking status: Current Some Day Smoker    Packs/day: 1.00    Years: 50.00    Pack years: 50.00    Types: Cigarettes  . Smokeless tobacco: Never Used  Substance Use Topics  . Alcohol use: No    Alcohol/week: 0.0 standard drinks  . Drug use: No    ALLERGIES:  is allergic to metoprolol.  MEDICATIONS:  Current Outpatient Medications  Medication Sig Dispense Refill  . amLODipine (NORVASC) 10 MG tablet TAKE 1 TABLET BY MOUTH EVERY DAY 90 tablet 1  . atorvastatin (LIPITOR) 20 MG tablet Take 20 mg by mouth daily at 6 PM.     . Calcium Carbonate-Vitamin D 600-200 MG-UNIT CAPS Take 200 capsules by mouth once.    . citalopram (CELEXA) 40 MG tablet Take 1 tablet (40 mg total) by mouth daily. 90 tablet 1  . DUREZOL 0.05 % EMUL     . lisinopril (ZESTRIL) 2.5 MG tablet Take 1 tablet (2.5 mg total) by mouth daily. 90 tablet 1  . PROLENSA 0.07 % SOLN Place 1 drop into the left eye at bedtime.    . VERZENIO 50 MG tablet TAKE 1 TABLET (50 MG TOTAL) BY MOUTH 2 (TWO) TIMES DAILY. 56 tablet 6    No current facility-administered medications for this visit.    PHYSICAL EXAMINATION: ECOG PERFORMANCE STATUS: 0 - Asymptomatic  BP (!) 154/70 (BP Location: Left Arm, Patient Position: Sitting, Cuff Size: Normal)   Pulse 91   Temp (!) 95.9 F (35.5 C) (Tympanic)   Wt 151 lb (68.5 kg)   BMI 24.37 kg/m   Filed Weights   02/16/20 1501  Weight: 151 lb (68.5 kg)    Physical Exam  Constitutional: She is oriented to person, place, and time and well-developed, well-nourished, and in no distress.    She is walking by self.  HENT:  Head: Normocephalic and atraumatic.  Mouth/Throat: Oropharynx is clear and moist. No oropharyngeal exudate.  Eyes: Pupils are equal, round, and reactive to light.  Cardiovascular: Normal rate and regular rhythm.  Pulmonary/Chest: No respiratory distress. She has no wheezes.  Abdominal: Soft. Bowel sounds are normal. She exhibits no distension and no mass. There is no abdominal tenderness. There is no rebound and no guarding.    Musculoskeletal:        General: No tenderness or edema. Normal range of motion.     Cervical back: Normal range of motion and neck supple.  Neurological: She is alert and oriented to person, place, and time.  Skin: Skin is warm.  Psychiatric: Affect normal.   LABORATORY DATA:  I have reviewed the data as listed    Component Value Date/Time   NA 140 02/16/2020 1441   NA 139 05/30/2014 0127   K 3.7 02/16/2020 1441   K 3.7 05/30/2014 0127   CL 104 02/16/2020 1441   CL 106 05/30/2014 0127   CO2 26 02/16/2020 1441   CO2 25 05/30/2014 0127   GLUCOSE 115 (H) 02/16/2020 1441   GLUCOSE 136 (H) 05/30/2014 0127   BUN 17 02/16/2020 1441   BUN 19 (H) 05/30/2014 0127   CREATININE 1.08 (H) 02/16/2020 1441   CREATININE 0.98 05/30/2014 0127   CALCIUM 9.1 02/16/2020 1441   CALCIUM 9.0 05/30/2014 0127   PROT 7.7 02/16/2020 1441   PROT 7.8 05/08/2014 1131   ALBUMIN 4.0 02/16/2020 1441   ALBUMIN 3.7 05/08/2014 1131   AST 22  02/16/2020 1441   AST 20 05/08/2014 1131   ALT 11 02/16/2020 1441   ALT 18 05/08/2014 1131   ALKPHOS 52 02/16/2020 1441   ALKPHOS 58 05/08/2014 1131   BILITOT 0.5 02/16/2020 1441   BILITOT 0.4 05/08/2014 1131   GFRNONAA 49 (L) 02/16/2020 1441   GFRNONAA 58 (L) 05/30/2014 0127   GFRAA 57 (L) 02/16/2020 1441   GFRAA >60 05/30/2014 0127    No results found for: SPEP, UPEP  Lab Results  Component Value Date   WBC 4.8 02/16/2020   NEUTROABS 1.9 02/16/2020   HGB 10.6 (L) 02/16/2020   HCT 34.8 (L) 02/16/2020   MCV 99.7 02/16/2020   PLT 194 02/16/2020      Chemistry      Component Value Date/Time   NA 140 02/16/2020 1441   NA 139 05/30/2014 0127   K 3.7 02/16/2020 1441   K 3.7 05/30/2014 0127   CL 104 02/16/2020 1441   CL 106 05/30/2014 0127   CO2 26 02/16/2020 1441   CO2 25 05/30/2014 0127   BUN 17 02/16/2020 1441   BUN 19 (H) 05/30/2014 0127   CREATININE 1.08 (H) 02/16/2020 1441   CREATININE 0.98 05/30/2014 0127      Component Value Date/Time   CALCIUM 9.1 02/16/2020 1441   CALCIUM 9.0 05/30/2014 0127   ALKPHOS 52 02/16/2020 1441   ALKPHOS 58 05/08/2014 1131   AST 22 02/16/2020 1441   AST 20 05/08/2014 1131   ALT 11 02/16/2020 1441   ALT 18 05/08/2014 1131   BILITOT 0.5 02/16/2020 1441   BILITOT 0.4 05/08/2014 1131        RADIOGRAPHIC STUDIES: I have personally reviewed the radiological images as listed and agreed with the findings in the report. No results found.   ASSESSMENT & PLAN:   Malignant neoplasm of overlapping sites of left breast in female, estrogen receptor positive ([place]) #Left breast ipsilateral chest wall recurrence stage IV [2015]; currently on Faslodex plus abema. DEC 22nd 2020 PET scan-improved mediastinal adenopathy/right pleural disease; right iliac lesion.  Tumor markers stable.  Clinically stable-except for bilateral hip pain [see below]  # Continue Faslodex+ abema 71m BID. On Abema 50 mg BID. Labs today reviewed;  acceptable for  treatment today.   #Bilateral hip pain-likely arthritis- unlikely progressive disease given complete response on PET scan 2 months  ago.  Discussed that would recommend imaging/x-rays if pain gets worse.  Continue NSAIDs for now  # Mild anemia hemoglobin 106-11; STABLE. .  Stool occult - not done again today.  Reminded patient to get the stool cards done  # HTN- at home 140-150ss.  Continue Norvasc to 10 mg a day Continue lisinopril.  Stable.   # CKD- stage III stable  # DISPOSITION:  # shot today # follow up with MD/labs- cbc/cmp/in 4 weeks 27-29; Faslodex-Dr.B     Govinda R Brahmanday, MD 02/17/2020 7:46 AM 

## 2020-02-17 LAB — CANCER ANTIGEN 27.29: CA 27.29: 24.5 U/mL (ref 0.0–38.6)

## 2020-02-19 ENCOUNTER — Ambulatory Visit: Payer: Medicare HMO | Admitting: Internal Medicine

## 2020-02-19 ENCOUNTER — Ambulatory Visit: Payer: Medicare HMO

## 2020-02-19 ENCOUNTER — Other Ambulatory Visit: Payer: Medicare HMO

## 2020-02-19 MED FILL — VERZENIO 50 MG TABS: 50 | 28 days supply | Qty: 56 | Fill #6

## 2020-03-09 ENCOUNTER — Telehealth: Payer: Self-pay

## 2020-03-09 NOTE — Telephone Encounter (Signed)
Called patient to notify her that she will be due for her 5 year Colonoscopy follow up. Her last one was in 2016 with Dr Bary Castilla. He is no longer with our office and has set up his own private practice. She would like to return to Dr Bary Castilla for this. Contact information provided to the patient and she will call to schedule this with their office.

## 2020-03-15 ENCOUNTER — Other Ambulatory Visit: Payer: Medicare HMO

## 2020-03-15 ENCOUNTER — Ambulatory Visit: Payer: Medicare HMO | Admitting: Internal Medicine

## 2020-03-15 ENCOUNTER — Ambulatory Visit: Payer: Medicare HMO

## 2020-03-16 DIAGNOSIS — C50812 Malignant neoplasm of overlapping sites of left female breast: Secondary | ICD-10-CM | POA: Diagnosis not present

## 2020-03-16 DIAGNOSIS — Z170 Estrogen receptor positive status [ER+]: Secondary | ICD-10-CM | POA: Insufficient documentation

## 2020-03-16 DIAGNOSIS — C7989 Secondary malignant neoplasm of other specified sites: Secondary | ICD-10-CM | POA: Insufficient documentation

## 2020-03-16 DIAGNOSIS — Z5111 Encounter for antineoplastic chemotherapy: Secondary | ICD-10-CM | POA: Diagnosis not present

## 2020-03-17 ENCOUNTER — Other Ambulatory Visit: Payer: Self-pay

## 2020-03-17 ENCOUNTER — Encounter: Payer: Self-pay | Admitting: Internal Medicine

## 2020-03-17 DIAGNOSIS — C50812 Malignant neoplasm of overlapping sites of left female breast: Secondary | ICD-10-CM | POA: Diagnosis not present

## 2020-03-17 DIAGNOSIS — Z5111 Encounter for antineoplastic chemotherapy: Secondary | ICD-10-CM | POA: Diagnosis not present

## 2020-03-17 DIAGNOSIS — C7989 Secondary malignant neoplasm of other specified sites: Secondary | ICD-10-CM | POA: Diagnosis not present

## 2020-03-17 DIAGNOSIS — Z170 Estrogen receptor positive status [ER+]: Secondary | ICD-10-CM | POA: Diagnosis not present

## 2020-03-17 NOTE — Progress Notes (Signed)
Patient pre screened for office appointment, no questions or concerns today. Patient reminded of upcoming appointment time and date. 

## 2020-03-18 ENCOUNTER — Inpatient Hospital Stay: Payer: Medicare HMO

## 2020-03-18 ENCOUNTER — Other Ambulatory Visit: Payer: Self-pay | Admitting: Internal Medicine

## 2020-03-18 ENCOUNTER — Inpatient Hospital Stay: Payer: Medicare HMO | Attending: Internal Medicine | Admitting: Internal Medicine

## 2020-03-18 VITALS — BP 169/81 | HR 89 | Temp 97.5°F | Resp 18 | Wt 153.0 lb

## 2020-03-18 DIAGNOSIS — C792 Secondary malignant neoplasm of skin: Secondary | ICD-10-CM

## 2020-03-18 DIAGNOSIS — Z170 Estrogen receptor positive status [ER+]: Secondary | ICD-10-CM

## 2020-03-18 DIAGNOSIS — M25551 Pain in right hip: Secondary | ICD-10-CM

## 2020-03-18 DIAGNOSIS — C50812 Malignant neoplasm of overlapping sites of left female breast: Secondary | ICD-10-CM

## 2020-03-18 DIAGNOSIS — M25552 Pain in left hip: Secondary | ICD-10-CM | POA: Diagnosis not present

## 2020-03-18 DIAGNOSIS — C7989 Secondary malignant neoplasm of other specified sites: Secondary | ICD-10-CM | POA: Diagnosis not present

## 2020-03-18 DIAGNOSIS — C50912 Malignant neoplasm of unspecified site of left female breast: Secondary | ICD-10-CM

## 2020-03-18 DIAGNOSIS — Z5111 Encounter for antineoplastic chemotherapy: Secondary | ICD-10-CM | POA: Diagnosis not present

## 2020-03-18 LAB — CBC WITH DIFFERENTIAL/PLATELET
Abs Immature Granulocytes: 0.02 10*3/uL (ref 0.00–0.07)
Basophils Absolute: 0 10*3/uL (ref 0.0–0.1)
Basophils Relative: 1 %
Eosinophils Absolute: 0.2 10*3/uL (ref 0.0–0.5)
Eosinophils Relative: 3 %
HCT: 34.3 % — ABNORMAL LOW (ref 36.0–46.0)
Hemoglobin: 10.9 g/dL — ABNORMAL LOW (ref 12.0–15.0)
Immature Granulocytes: 0 %
Lymphocytes Relative: 54 %
Lymphs Abs: 2.8 10*3/uL (ref 0.7–4.0)
MCH: 31.5 pg (ref 26.0–34.0)
MCHC: 31.8 g/dL (ref 30.0–36.0)
MCV: 99.1 fL (ref 80.0–100.0)
Monocytes Absolute: 0.6 10*3/uL (ref 0.1–1.0)
Monocytes Relative: 12 %
Neutro Abs: 1.6 10*3/uL — ABNORMAL LOW (ref 1.7–7.7)
Neutrophils Relative %: 30 %
Platelets: 206 10*3/uL (ref 150–400)
RBC: 3.46 MIL/uL — ABNORMAL LOW (ref 3.87–5.11)
RDW: 14.5 % (ref 11.5–15.5)
WBC: 5.1 10*3/uL (ref 4.0–10.5)
nRBC: 0 % (ref 0.0–0.2)

## 2020-03-18 LAB — COMPREHENSIVE METABOLIC PANEL
ALT: 11 U/L (ref 0–44)
AST: 20 U/L (ref 15–41)
Albumin: 4.1 g/dL (ref 3.5–5.0)
Alkaline Phosphatase: 53 U/L (ref 38–126)
Anion gap: 8 (ref 5–15)
BUN: 16 mg/dL (ref 8–23)
CO2: 27 mmol/L (ref 22–32)
Calcium: 9.2 mg/dL (ref 8.9–10.3)
Chloride: 101 mmol/L (ref 98–111)
Creatinine, Ser: 1.25 mg/dL — ABNORMAL HIGH (ref 0.44–1.00)
GFR calc Af Amer: 48 mL/min — ABNORMAL LOW (ref >60)
GFR calc non Af Amer: 41 mL/min — ABNORMAL LOW (ref >60)
Glucose, Bld: 105 mg/dL — ABNORMAL HIGH (ref 70–99)
Potassium: 3.9 mmol/L (ref 3.5–5.1)
Sodium: 136 mmol/L (ref 135–145)
Total Bilirubin: 0.5 mg/dL (ref 0.3–1.2)
Total Protein: 7.6 g/dL (ref 6.5–8.1)

## 2020-03-18 LAB — OCCULT BLOOD X 1 CARD TO LAB, STOOL
Fecal Occult Bld: NEGATIVE
Fecal Occult Bld: NEGATIVE
Fecal Occult Bld: POSITIVE — AB

## 2020-03-18 MED ORDER — TRAMADOL HCL 50 MG PO TABS: 50.0000 mg | ORAL_TABLET | Freq: Three times a day (TID) | ORAL | 0 refills | Status: DC | PRN

## 2020-03-18 MED ORDER — FULVESTRANT 250 MG/5ML IM SOLN
500.0000 mg | Freq: Once | INTRAMUSCULAR | Status: AC
  Administered 2020-03-18: 500 mg via INTRAMUSCULAR
  Filled 2020-03-18: qty 10

## 2020-03-18 NOTE — Progress Notes (Signed)
[facility] OFFICE PROGRESS NOTE  Patient Care Team: [name], MD as PCP - General (Internal Medicine) Bary Castilla, Forest Gleason, MD (General Surgery) Marden Noble, MD (Internal Medicine) [name], MD as Consulting Physician (Internal Medicine)  Dr.Charles Mayer Masker; Cutter  SUMMARY OF ONCOLOGIC HISTORY:  Oncology History Overview Note  # 2007- LEFT BREAST CA STAGE II [T2N54m; s/p mastec; Dr.Byrnett] ER-Pos/PR Neg; Her- 2 Neu- NEG; ACx4-Taxol x12;Femara; stopped May 2009-stopped follow up.  #JAN 2015- Post mastectomy Chest wall recurrence [chest wall mass bx-]; ER- 90%; PR- NEG; Her2 Neu- NEG; s/p excision [involving skeletal muscle/adipose tissue; clear margins]; CT July 2017- NED;   # Jan 2020- progression/ hilar- START abema [jan 14th]+ faslodex[jan 13th]  # OSTEOPENIA [BMD- June 2016]  # Kidney cysts  DIAGNOSIS: LEFT BREAST CA  STAGE: IV- NED  ;GOALS: pallaitive  CURRENT/MOST RECENT THERAPY: faslodex+ Abema    [place] to skin ([place]  02/05/2014 Initial Diagnosis   [facility] [facility] [place]   Malignant neoplasm of overlapping sites of left breast in female, estrogen receptor positive ([place]  07/24/2016 Initial Diagnosis   Cancer of overlapping sites of left female breast ([place]   01/06/2019 -  Chemotherapy   The patient had fulvestrant (FASLODEX) injection 500 mg, 500 mg, Intramuscular,  Once, 0 of 4 cycles Dose modification: 500 mg (original dose 500 mg, Cycle 1)  for chemotherapy treatment.     INTERVAL HISTORY: Accompanied by daughter.  [name] patient with above history of ER/PR positive breast cancer stage IV currently on Faslodex + abema is here for follow-up.  Patient complains of worsening hip pain bilaterally.  Radiating bilateral lower extremities.  Worse with movement.  No pain with else.   Review of Systems  Constitutional: Positive for  malaise/fatigue. Negative for chills, diaphoresis, fever and weight loss.  HENT: Negative for nosebleeds and sore throat.   Eyes: Negative for double vision.  Respiratory: Negative for cough, hemoptysis, sputum production, shortness of breath and wheezing.   Cardiovascular: Negative for chest pain, palpitations, orthopnea and leg swelling.  Gastrointestinal: Negative for abdominal pain, blood in stool, constipation, diarrhea, heartburn, melena, nausea and vomiting.  Musculoskeletal: Positive for back pain and joint pain.  Skin: Negative.  Negative for itching and rash.  Neurological: Negative for tingling, focal weakness, weakness and headaches.  Endo/Heme/Allergies: Does not bruise/bleed easily.  Psychiatric/Behavioral: Negative for depression. The patient is not nervous/anxious and does not have insomnia.    PAST MEDICAL HISTORY :  Past Medical History:  Diagnosis Date  . [place]neurysm ([place]  . Arthritis   . Cancer ([facility]   diagnosed 01/07 left breast with simple mastectomu & sn bx. The pt had a 3.5cm tumor. Frozen section report on the 2 sn were negative. On permanent sections a 0.543mmicrometastasis was identified. She was not felt to require a complete axillary dissection. She has completed her Tamoxifen therapy and has been released from routine f/u with medical oncology service.  . Diabetes mellitus without complication ([place]  . Hyperlipidemia   . Hypertension    since age 89. [facility]   Mastectomy site recurrence resected 01/26/2014, ER 90%, PR 0%, HER-2/neu nonamplified.  . Other benign neoplasm of connective and other soft tissue of trunk, unspecified 2014  . Personal history of colonic polyps   . Personal history of tobacco use, presenting hazards to health   .  Special screening for malignant neoplasms, colon   . Unspecified disorder of skin and subcutaneous tissue 02/19/2013   biopsy of skin over  the sternum on the right breast showed hypertrophic scar,keloid formation.    PAST SURGICAL HISTORY :   Past Surgical History:  Procedure Laterality Date  . ABDOMINAL HYSTERECTOMY  2004   total  . BREAST BIOPSY Right January 26, 2014   Core biopsy for mild increase breast uptake on PET scan, usual ductal hyperplasia and stromal fibrosis  . BREAST SURGERY Left 2007   mastectomy  . chest wall mass  01/26/14  . CHOLECYSTECTOMY  2007  . COLONOSCOPY W/ POLYPECTOMY  2011   Dr. Brynda Greathouse  . EYE SURGERY Right    cataract surgery  . head surgery  1991  . MASTECTOMY Left 2007  . PORT-A-CATH REMOVAL  2008  . PORTACATH PLACEMENT  2007    FAMILY HISTORY :   Family History  Problem Relation Age of Onset  . Cancer Other        ovarian,breast,colon cancers;relations not listed  . Breast cancer Neg Hx     SOCIAL HISTORY:   Social History   Tobacco Use  . Smoking status: Current Some Day Smoker    Packs/day: 1.00    Years: 50.00    Pack years: 50.00    Types: Cigarettes  . Smokeless tobacco: Never Used  Substance Use Topics  . Alcohol use: No    Alcohol/week: 0.0 standard drinks  . Drug use: No    ALLERGIES:  is allergic to metoprolol.  MEDICATIONS:  Current Outpatient Medications  Medication Sig Dispense Refill  . amLODipine (NORVASC) 10 MG tablet TAKE 1 TABLET BY MOUTH EVERY DAY 90 tablet 1  . atorvastatin (LIPITOR) 20 MG tablet Take 20 mg by mouth daily at 6 PM.     . Calcium Carbonate-Vitamin D 600-200 MG-UNIT CAPS Take 200 capsules by mouth once.    . citalopram (CELEXA) 40 MG tablet Take 1 tablet (40 mg total) by mouth daily. 90 tablet 1  . DUREZOL 0.05 % EMUL     . lisinopril (ZESTRIL) 2.5 MG tablet Take 1 tablet (2.5 mg total) by mouth daily. 90 tablet 1  . PROLENSA 0.07 % SOLN Place 1 drop into the left eye at bedtime.    . traMADol (ULTRAM) 50 MG tablet Take 1 tablet (50 mg total) by mouth every 8 (eight) hours as needed. 90 tablet 0  . VERZENIO 50 MG tablet TAKE 1  TABLET (50 MG TOTAL) BY MOUTH 2 (TWO) TIMES DAILY. 56 tablet 6   No current facility-administered medications for this visit.    PHYSICAL EXAMINATION: ECOG PERFORMANCE STATUS: 0 - Asymptomatic  BP (!) 169/81 (BP Location: Right Arm, Patient Position: Sitting)   Pulse 89   Temp (!) 97.5 F (36.4 C) (Tympanic)   Resp 18   Wt 153 lb (69.4 kg)   SpO2 100%   BMI 24.69 kg/m   Filed Weights   03/18/20 1108  Weight: 153 lb (69.4 kg)    Physical Exam  Constitutional: She is oriented to person, place, and time and well-developed, well-nourished, and in no distress.    She is walking by self.  HENT:  Head: Normocephalic and atraumatic.  Mouth/Throat: Oropharynx is clear and moist. No oropharyngeal exudate.  Eyes: Pupils are equal, round, and reactive to light.  Cardiovascular: Normal rate and regular rhythm.  Pulmonary/Chest: No respiratory distress. She has no wheezes.  Abdominal: Soft. Bowel sounds are normal. She exhibits no  distension and no mass. There is no abdominal tenderness. There is no rebound and no guarding.  Musculoskeletal:        General: No tenderness or edema. Normal range of motion.     Cervical back: Normal range of motion and neck supple.  Neurological: She is alert and oriented to person, place, and time.  Skin: Skin is warm.  Psychiatric: Affect normal.   LABORATORY DATA:  I have reviewed the data as listed    Component Value Date/Time   NA 136 03/18/2020 1022   NA 139 05/30/2014 0127   K 3.9 03/18/2020 1022   K 3.7 05/30/2014 0127   CL 101 03/18/2020 1022   CL 106 05/30/2014 0127   CO2 27 03/18/2020 1022   CO2 25 05/30/2014 0127   GLUCOSE 105 (H) 03/18/2020 1022   GLUCOSE 136 (H) 05/30/2014 0127   BUN 16 03/18/2020 1022   BUN 19 (H) 05/30/2014 0127   CREATININE 1.25 (H) 03/18/2020 1022   CREATININE 0.98 05/30/2014 0127   CALCIUM 9.2 03/18/2020 1022   CALCIUM 9.0 05/30/2014 0127   PROT 7.6 03/18/2020 1022   PROT 7.8 05/08/2014 1131   ALBUMIN  4.1 03/18/2020 1022   ALBUMIN 3.7 05/08/2014 1131   AST 20 03/18/2020 1022   AST 20 05/08/2014 1131   ALT 11 03/18/2020 1022   ALT 18 05/08/2014 1131   ALKPHOS 53 03/18/2020 1022   ALKPHOS 58 05/08/2014 1131   BILITOT 0.5 03/18/2020 1022   BILITOT 0.4 05/08/2014 1131   GFRNONAA 41 (L) 03/18/2020 1022   GFRNONAA 58 (L) 05/30/2014 0127   GFRAA 48 (L) 03/18/2020 1022   GFRAA >60 05/30/2014 0127    No results found for: SPEP, UPEP  Lab Results  Component Value Date   WBC 5.1 03/18/2020   NEUTROABS 1.6 (L) 03/18/2020   HGB 10.9 (L) 03/18/2020   HCT 34.3 (L) 03/18/2020   MCV 99.1 03/18/2020   PLT 206 03/18/2020      Chemistry      Component Value Date/Time   NA 136 03/18/2020 1022   NA 139 05/30/2014 0127   K 3.9 03/18/2020 1022   K 3.7 05/30/2014 0127   CL 101 03/18/2020 1022   CL 106 05/30/2014 0127   CO2 27 03/18/2020 1022   CO2 25 05/30/2014 0127   BUN 16 03/18/2020 1022   BUN 19 (H) 05/30/2014 0127   CREATININE 1.25 (H) 03/18/2020 1022   CREATININE 0.98 05/30/2014 0127      Component Value Date/Time   CALCIUM 9.2 03/18/2020 1022   CALCIUM 9.0 05/30/2014 0127   ALKPHOS 53 03/18/2020 1022   ALKPHOS 58 05/08/2014 1131   AST 20 03/18/2020 1022   AST 20 05/08/2014 1131   ALT 11 03/18/2020 1022   ALT 18 05/08/2014 1131   BILITOT 0.5 03/18/2020 1022   BILITOT 0.4 05/08/2014 1131        RADIOGRAPHIC STUDIES: I have personally reviewed the radiological images as listed and agreed with the findings in the report. No results found.   ASSESSMENT & PLAN:   Malignant neoplasm of overlapping sites of left breast in female, estrogen receptor positive ([place]) #Left breast ipsilateral chest wall recurrence stage IV [2015]; currently on Faslodex plus abema. DEC 22nd 2020 PET scan-improved mediastinal adenopathy/right pleural disease; right iliac lesion.  Tumor markers stable.  Clinically stable-except for bilateral hip pain [see below]  # Continue Faslodex+ abema 79m  BID. On Abema 50 mg BID. Labs today reviewed;  acceptable for treatment  today.   #Bilateral hip pain worse- etiology is unclear; x-ray mild arthritis otherwise no acute process.  Recommend MRI for further evaluation.  Referral to orthopedics.  # Mild anemia hemoglobin 10-11; STABLE;  Stool test 1/3 positive. Would recommend GI evaluation.   # HTN- at home 140-150ss.  Continue Norvasc to 10 mg a day Continue lisinopril.  Stable.   # CKD- stage III- stable.   # DISPOSITION:  # shot today # referral to orthopedics- bil hip pain # follow up with MD/labs- cbc/cmp/in 4 weeks 27-29; Faslodex; MRI ASAP-Dr.B     [name], MD 03/25/2020 7:43 AM

## 2020-03-18 NOTE — Telephone Encounter (Signed)
Contains abnormal data CBC with Differential Order: [id number] Status:  Final result  Visible to patient:  Yes (MyChart)  Next appt:  04/15/2020 at 10:30 AM in Oncology ([facility])  Dx:  Malignant neoplasm of overlapping sit...  Ref Range & Units 10:22  WBC 4.0 - 10.5 K/uL 5.1   RBC 3.87 - 5.11 MIL/uL 3.46Low    Hemoglobin 12.0 - 15.0 g/dL 10.9Low    HCT 36.0 - 46.0 % 34.3Low    MCV 80.0 - 100.0 fL 99.1   MCH 26.0 - 34.0 pg 31.5   MCHC 30.0 - 36.0 g/dL 31.8   RDW 11.5 - 15.5 % 14.5   Platelets 150 - 400 K/uL 206   nRBC 0.0 - 0.2 % 0.0   Neutrophils Relative % % 30   Neutro Abs 1.7 - 7.7 K/uL 1.6Low    Lymphocytes Relative % 54   Lymphs Abs 0.7 - 4.0 K/uL 2.8   Monocytes Relative % 12   Monocytes Absolute 0.1 - 1.0 K/uL 0.6   Eosinophils Relative % 3   Eosinophils Absolute 0.0 - 0.5 K/uL 0.2   Basophils Relative % 1   Basophils Absolute 0.0 - 0.1 K/uL 0.0   Immature Granulocytes % 0   Abs Immature Granulocytes 0.00 - 0.07 K/uL 0.02   Comment: Performed at [facility], [address], [place], [place] [address]  Resulting Agency  [facility] CLIN LAB      Specimen Collected: 03/18/20 10:22  Last Resulted: 03/18/20 10:35     Lab Flowsheet   Order Details   View Encounter   Lab and Collection Details   Routing   Result History         Other Results from 03/18/2020  Cancer antigen 27.29  Status:  In process  Visible to patient:  No (not released)  Next appt:  04/15/2020 at 10:30 AM in Oncology ([facility])  Dx:  Malignant neoplasm of overlapping sit... Order: [id number]     Specimen Collected: 03/18/20 10:22  Last Resulted: 03/18/20 10:24     Order Details   View Encounter   Lab and Collection Details   Routing   Result History           Contains abnormal data Comprehensive metabolic panel  Status:  Final result  Visible to patient:  Yes (MyChart)  Next appt:  04/15/2020 at 10:30 AM in Oncology ([facility])  Dx:  Malignant neoplasm of  overlapping sit... Order: [id number]  Ref Range & Units 10:22  Sodium 135 - 145 mmol/L 136   Potassium 3.5 - 5.1 mmol/L 3.9   Chloride 98 - 111 mmol/L 101   CO2 22 - 32 mmol/L 27   Glucose, Bld 70 - 99 mg/dL 105High    Comment: Glucose reference range applies only to samples taken after fasting for at least 8 hours.  BUN 8 - 23 mg/dL 16   Creatinine, Ser 0.44 - 1.00 mg/dL 1.25High    Calcium 8.9 - 10.3 mg/dL 9.2   Total Protein 6.5 - 8.1 g/dL 7.6   Albumin 3.5 - 5.0 g/dL 4.1   AST 15 - 41 U/L 20   ALT 0 - 44 U/L 11   Alkaline Phosphatase 38 - 126 U/L 53   Total Bilirubin 0.3 - 1.2 mg/dL 0.5   GFR calc non Af Amer >60 mL/min 41Low    GFR calc Af Amer >60 mL/min 48Low    Anion gap 5 - 15 8   Comment: Performed at [facility], [address] [place], [place] [address]  Resulting Agency  [facility] CLIN LAB      Specimen Collected: 03/18/20 10:22  Last Resulted: 03/18/20 10:46     Lab Flowsheet   Order Details   View Encounter   Lab and Collection Details   Routing   Result History           Contains abnormal data Occult blood card to lab, stool  Status:  Final result  Visible to patient:  No (scheduled for 03/18/2020 4:29 PM)  Next appt:  04/15/2020 at 10:30 AM in Oncology ([facility])  Dx:  Malignant neoplasm of overlapping sit... Order: [id number]  Ref Range & Units 07:00  Fecal Occult Bld NEGATIVE POSITIVEAbnormal    Comment: Performed at [facility], [address] [place], [place] [address]  Resulting Agency  [facility] CLIN LAB      Specimen Collected: 03/18/20 07:00  Last Resulted: 03/18/20 15:29     Lab Flowsheet   Order Details   View Encounter   Lab and Collection Details   Routing   Result History           Occult blood card to lab, stool  Status:  Final result  Visible to patient:  No (scheduled for 03/18/2020 4:29 PM)  Next appt:  04/15/2020 at 10:30 AM in Oncology ([facility])  Dx:  Malignant neoplasm of overlapping  sit... Order: [id number]   (important suggestion)  Newer results are available. Click to view them now.   Ref Range & Units 1 d ago  Fecal Occult Bld NEGATIVE NEGATIVE   Comment: Performed at [facility], [address] [place], [place] [address]  Resulting Agency  [facility] CLIN LAB      Specimen Collected: 03/17/20 09:00  Last Resulted: 03/18/20 15:29

## 2020-03-18 NOTE — Assessment & Plan Note (Addendum)
#  Left breast ipsilateral chest wall recurrence stage IV [2015]; currently on Faslodex plus abema. DEC 22nd 2020 PET scan-improved mediastinal adenopathy/right pleural disease; right iliac lesion.  Tumor markers stable.  Clinically stable-except for bilateral hip pain [see below]  # Continue Faslodex+ abema 50mg  BID. On Abema 50 mg BID. Labs today reviewed;  acceptable for treatment today.   #Bilateral hip pain worse- etiology is unclear; x-ray mild arthritis otherwise no acute process.  Recommend MRI for further evaluation.  Referral to orthopedics.  # Mild anemia hemoglobin 10-11; STABLE;  Stool test 1/3 positive. Would recommend GI evaluation.   # HTN- at home 140-150ss.  Continue Norvasc to 10 mg a day Continue lisinopril.  Stable.   # CKD- stage III- stable.   # DISPOSITION:  # shot today # referral to orthopedics- bil hip pain # follow up with MD/labs- cbc/cmp/in 4 weeks 27-29; Faslodex; MRI ASAP-Dr.B

## 2020-03-19 ENCOUNTER — Other Ambulatory Visit: Payer: Self-pay | Admitting: Internal Medicine

## 2020-03-19 LAB — CANCER ANTIGEN 27.29: CA 27.29: 28.4 U/mL (ref 0.0–38.6)

## 2020-03-19 NOTE — Telephone Encounter (Signed)
Dr. B - please advise. 

## 2020-03-25 MED FILL — VERZENIO 50 MG TABS: 50 | 28 days supply | Qty: 56 | Fill #0

## 2020-03-29 ENCOUNTER — Telehealth: Payer: Self-pay | Admitting: *Deleted

## 2020-03-29 NOTE — Telephone Encounter (Signed)
Per Colette - ortho referral- [facility] been scheduled for 04/22/20

## 2020-04-05 ENCOUNTER — Ambulatory Visit
Admission: RE | Admit: 2020-04-05 | Discharge: 2020-04-05 | Disposition: A | Discharge status: Home / Self Care | Payer: Medicare HMO | Source: Ambulatory Visit | Attending: Internal Medicine | Admitting: Internal Medicine

## 2020-04-05 ENCOUNTER — Other Ambulatory Visit: Payer: Self-pay

## 2020-04-05 DIAGNOSIS — M25552 Pain in left hip: Secondary | ICD-10-CM

## 2020-04-05 DIAGNOSIS — M25551 Pain in right hip: Secondary | ICD-10-CM

## 2020-04-07 ENCOUNTER — Telehealth: Payer: Self-pay | Admitting: *Deleted

## 2020-04-07 NOTE — Telephone Encounter (Signed)
Jessica Compton from MRI contacted cancer center. Patient prescreened for MRI. Patient has h/o aneurysm. Records were retrieved from previous procedure. Unable to verify type of brain clips in place.  Unable to proceed with planned MRI.  MRI cnl  Dr. Rogue Bussing. Please advise.

## 2020-04-07 NOTE — Telephone Encounter (Signed)
Spoke with Dr. Rogue Bussing. Will hold off on recommending any other scans at this time. Will wait on formal recommendations from orthopedic office. Apt with ortho is on 4/29 per chart documentation.   Contacted patient's daughter Helene Kelp and updated her on the above plan of care.

## 2020-04-15 ENCOUNTER — Inpatient Hospital Stay: Payer: Medicare HMO

## 2020-04-15 ENCOUNTER — Other Ambulatory Visit: Payer: Self-pay

## 2020-04-15 ENCOUNTER — Inpatient Hospital Stay: Payer: Medicare HMO | Attending: Internal Medicine

## 2020-04-15 ENCOUNTER — Inpatient Hospital Stay (HOSPITAL_BASED_OUTPATIENT_CLINIC_OR_DEPARTMENT_OTHER): Payer: Medicare HMO | Admitting: Internal Medicine

## 2020-04-15 ENCOUNTER — Encounter: Payer: Self-pay | Admitting: Internal Medicine

## 2020-04-15 DIAGNOSIS — C50812 Malignant neoplasm of overlapping sites of left female breast: Secondary | ICD-10-CM | POA: Diagnosis not present

## 2020-04-15 DIAGNOSIS — Z170 Estrogen receptor positive status [ER+]: Secondary | ICD-10-CM

## 2020-04-15 DIAGNOSIS — Z5111 Encounter for antineoplastic chemotherapy: Secondary | ICD-10-CM | POA: Diagnosis not present

## 2020-04-15 DIAGNOSIS — M8580 Other specified disorders of bone density and structure, unspecified site: Secondary | ICD-10-CM | POA: Insufficient documentation

## 2020-04-15 LAB — COMPREHENSIVE METABOLIC PANEL
ALT: 13 U/L (ref 0–44)
AST: 19 U/L (ref 15–41)
Albumin: 4 g/dL (ref 3.5–5.0)
Alkaline Phosphatase: 46 U/L (ref 38–126)
Anion gap: 11 (ref 5–15)
BUN: 15 mg/dL (ref 8–23)
CO2: 28 mmol/L (ref 22–32)
Calcium: 9.3 mg/dL (ref 8.9–10.3)
Chloride: 99 mmol/L (ref 98–111)
Creatinine, Ser: 1.21 mg/dL — ABNORMAL HIGH (ref 0.44–1.00)
GFR calc Af Amer: 50 mL/min — ABNORMAL LOW (ref >60)
GFR calc non Af Amer: 43 mL/min — ABNORMAL LOW (ref >60)
Glucose, Bld: 110 mg/dL — ABNORMAL HIGH (ref 70–99)
Potassium: 3.9 mmol/L (ref 3.5–5.1)
Sodium: 138 mmol/L (ref 135–145)
Total Bilirubin: 0.7 mg/dL (ref 0.3–1.2)
Total Protein: 7.8 g/dL (ref 6.5–8.1)

## 2020-04-15 LAB — CBC WITH DIFFERENTIAL/PLATELET
Abs Immature Granulocytes: 0.02 10*3/uL (ref 0.00–0.07)
Basophils Absolute: 0 10*3/uL (ref 0.0–0.1)
Basophils Relative: 1 %
Eosinophils Absolute: 0.1 10*3/uL (ref 0.0–0.5)
Eosinophils Relative: 1 %
HCT: 36.8 % (ref 36.0–46.0)
Hemoglobin: 11.6 g/dL — ABNORMAL LOW (ref 12.0–15.0)
Immature Granulocytes: 0 %
Lymphocytes Relative: 37 %
Lymphs Abs: 2.2 10*3/uL (ref 0.7–4.0)
MCH: 31.1 pg (ref 26.0–34.0)
MCHC: 31.5 g/dL (ref 30.0–36.0)
MCV: 98.7 fL (ref 80.0–100.0)
Monocytes Absolute: 0.5 10*3/uL (ref 0.1–1.0)
Monocytes Relative: 8 %
Neutro Abs: 3.2 10*3/uL (ref 1.7–7.7)
Neutrophils Relative %: 53 %
Platelets: 235 10*3/uL (ref 150–400)
RBC: 3.73 MIL/uL — ABNORMAL LOW (ref 3.87–5.11)
RDW: 13.2 % (ref 11.5–15.5)
WBC: 6 10*3/uL (ref 4.0–10.5)
nRBC: 0 % (ref 0.0–0.2)

## 2020-04-15 MED ORDER — FULVESTRANT 250 MG/5ML IM SOLN
500.0000 mg | Freq: Once | INTRAMUSCULAR | Status: AC
  Administered 2020-04-15: 500 mg via INTRAMUSCULAR
  Filled 2020-04-15: qty 10

## 2020-04-15 NOTE — Progress Notes (Signed)
Patient here today for follow up and treatment consideration regarding breast cancer. Patient and family report fatigue and ongoing diarrhea.

## 2020-04-15 NOTE — Assessment & Plan Note (Addendum)
#  Left breast ipsilateral chest wall recurrence stage IV [2015]; currently on Faslodex plus abema. DEC 22nd 2020 PET scan-improved mediastinal adenopathy/right pleural disease; right iliac lesion.  Tumor markers stable.  Clinically - STABLE. -except for bilateral hip pain/fatigue [see below]  #I would recommend proceeding with Faslodex today.  I would recommend holding Verzenio given the fatigue diarrhea.  # Fatigue/diarrhea-likely secondary to Verzenio recommend holding.  Discussed antidiarrheal as needed.  Monitor closely.  #Bilateral hip pain worse- etiology is unclear; x-ray mild arthritis otherwise no acute process.  Awaiting evaluation with Dr. Sabra Heck.  Tomorrow  # Mild anemia hemoglobin 10-11; STABLE;  Stool test 1/3 positive. Would recommend GI evaluation.  Monitor closely for now  # HTN- at home 140-150ss.  Continue Norvasc to 10 mg a day Continue lisinopril.  Stable  # CKD- stage III- STABLE.   # DISPOSITION:   # shot today # left breast mastectomy bra prosthesis # follow up with MD/labs- cbc/cmp/in 4 weeks 27-29; Faslodex; -Dr.B

## 2020-04-15 NOTE — Patient Instructions (Signed)
HOLD verzinio/ breast cancer pill until further instructions.

## 2020-04-15 NOTE — Progress Notes (Signed)
[place] OFFICE PROGRESS NOTE  Patient Care Team: [name], MD as PCP - General (Internal Medicine) Bary Castilla, Forest Gleason, MD (General Surgery) Marden Noble, MD (Internal Medicine) [name], MD as Consulting Physician (Internal Medicine)  Dr.Charles Mayer Masker; [place]  SUMMARY OF ONCOLOGIC HISTORY:  Oncology History Overview Note  # 2007- LEFT BREAST CA STAGE II [T2N27m; s/p mastec; Dr.Byrnett] ER-Pos/PR Neg; Her- 2 Neu- NEG; ACx4-Taxol x12;Femara; stopped May 2009-stopped follow up.  #JAN 2015- Post mastectomy Chest wall recurrence [chest wall mass bx-]; ER- 90%; PR- NEG; Her2 Neu- NEG; s/p excision [involving skeletal muscle/adipose tissue; clear margins]; CT July 2017- NED;   # Jan 2020- progression/ hilar- START abema [jan 14th]+ faslodex[jan 13th]  # OSTEOPENIA [BMD- June 2016]  # Kidney cysts  DIAGNOSIS: LEFT BREAST CA  STAGE: IV- NED  ;GOALS: pallaitive  CURRENT/MOST RECENT THERAPY: faslodex+ Abema    [place]ast cancer [place]asized to skin ([facility] [place]  02/05/2014 Initial Diagnosis   Bre[place]asized to skin ([facility]   Malignant neoplasm of overlapping sites of left breast in female, estrogen receptor positive ([facility]  07/24/2016 Initial Diagnosis   Cancer of overlapping sites of left female breast ([place]   01/06/2019 -  Chemotherapy   The patient had fulvestrant (FASLODEX) injection 500 mg, 500 mg, Intramuscular,  Once, 0 of 4 cycles Dose modification: 500 mg (original dose 500 mg, Cycle 1)  for chemotherapy treatment.     INTERVAL HISTORY: Accompanied by daughter.  [name] patient with above history of ER/PR positive breast cancer stage IV currently on Faslodex + abema is here for follow-up.  Patient continues to complain of bilateral hip pain.  She was unable to get the MRI pelvis because of previous brain aneurysm/stenting.  Patient is awaiting to see orthopedics Dr. MSabra Heck tomorrow.  Also complains of worsening fatigue.  Complains of diarrhea multiple loose stools over last few days.  Currently improved    Review of Systems  Constitutional: Positive for malaise/fatigue. Negative for chills, diaphoresis, fever and weight loss.  HENT: Negative for nosebleeds and sore throat.   Eyes: Negative for double vision.  Respiratory: Negative for cough, hemoptysis, sputum production, shortness of breath and wheezing.   Cardiovascular: Negative for chest pain, palpitations, orthopnea and leg swelling.  Gastrointestinal: Negative for abdominal pain, blood in stool, constipation, diarrhea, heartburn, melena, nausea and vomiting.  Musculoskeletal: Positive for back pain and joint pain.  Skin: Negative.  Negative for itching and rash.  Neurological: Negative for tingling, focal weakness, weakness and headaches.  Endo/Heme/Allergies: Does not bruise/bleed easily.  Psychiatric/Behavioral: Negative for depression. The patient is not nervous/anxious and does not have insomnia.    PAST MEDICAL HISTORY :  Past Medical History:  Diagnosis Date  . [place] ([facility]  . Arthritis   . Cancer ([facility] 2007   diagnosed 01/07 left breast with simple mastectomu & sn bx. The pt had a 3.5cm tumor. Frozen section report on the 2 sn were negative. On permanent sections a 0.571mmicrometastasis was identified. She was not felt to require a complete axillary dissection. She has completed her Tamoxifen therapy and has been released from routine f/u with medical oncology service.  . Diabetes mellitus without complication ([facility]  . Hyperlipidemia   . Hypertension    since age 79. [facility]   Mastectomy site recurrence resected 01/26/2014, ER 90%, PR 0%, HER-2/neu nonamplified.  . Other  benign neoplasm of connective and other soft tissue of trunk, unspecified 2014  . Personal history of colonic polyps   . Personal history of  tobacco use, presenting hazards to health   . Special screening for malignant neoplasms, colon   . Unspecified disorder of skin and subcutaneous tissue 02/19/2013   biopsy of skin over the sternum on the right breast showed hypertrophic scar,keloid formation.    PAST SURGICAL HISTORY :   Past Surgical History:  Procedure Laterality Date  . ABDOMINAL HYSTERECTOMY  2004   total  . BREAST BIOPSY Right January 26, 2014   Core biopsy for mild increase breast uptake on PET scan, usual ductal hyperplasia and stromal fibrosis  . BREAST SURGERY Left 2007   mastectomy  . chest wall mass  01/26/14  . CHOLECYSTECTOMY  2007  . COLONOSCOPY W/ POLYPECTOMY  2011   Dr. Brynda Greathouse  . EYE SURGERY Right    cataract surgery  . head surgery  1991  . MASTECTOMY Left 2007  . PORT-A-CATH REMOVAL  2008  . PORTACATH PLACEMENT  2007    FAMILY HISTORY :   Family History  Problem Relation Age of Onset  . Cancer Other        ovarian,breast,colon cancers;relations not listed  . Breast cancer Neg Hx     SOCIAL HISTORY:   Social History   Tobacco Use  . Smoking status: Current Some Day Smoker    Packs/day: 1.00    Years: 50.00    Pack years: 50.00    Types: Cigarettes  . Smokeless tobacco: Never Used  Substance Use Topics  . Alcohol use: No    Alcohol/week: 0.0 standard drinks  . Drug use: No    ALLERGIES:  is allergic to metoprolol.  MEDICATIONS:  Current Outpatient Medications  Medication Sig Dispense Refill  . amLODipine (NORVASC) 10 MG tablet TAKE 1 TABLET BY MOUTH EVERY DAY 90 tablet 1  . atorvastatin (LIPITOR) 20 MG tablet Take 20 mg by mouth daily at 6 PM.     . Calcium Carbonate-Vitamin D 600-200 MG-UNIT CAPS Take 200 capsules by mouth once.    . citalopram (CELEXA) 40 MG tablet Take 1 tablet (40 mg total) by mouth daily. 90 tablet 1  . DUREZOL 0.05 % EMUL     . lisinopril (ZESTRIL) 2.5 MG tablet Take 1 tablet (2.5 mg total) by mouth daily. 90 tablet 1  . PROLENSA 0.07 % SOLN Place 1  drop into the left eye at bedtime.    . traMADol (ULTRAM) 50 MG tablet Take 1 tablet (50 mg total) by mouth every 8 (eight) hours as needed. 90 tablet 0  . VERZENIO 50 MG tablet TAKE 1 TABLET (50 MG TOTAL) BY MOUTH 2 (TWO) TIMES DAILY. 56 tablet 6   No current facility-administered medications for this visit.    PHYSICAL EXAMINATION: ECOG PERFORMANCE STATUS: 0 - Asymptomatic  BP (!) 156/76   Pulse 76   Resp 18   Wt 146 lb 3.2 oz (66.3 kg)   BMI 23.60 kg/m   Filed Weights   04/15/20 1101  Weight: 146 lb 3.2 oz (66.3 kg)    Physical Exam  Constitutional: She is oriented to person, place, and time and well-developed, well-nourished, and in no distress.    She is walking by self.  HENT:  Head: Normocephalic and atraumatic.  Mouth/Throat: Oropharynx is clear and moist. No oropharyngeal exudate.  Eyes: Pupils are equal, round, and reactive to light.  Cardiovascular: Normal rate and regular rhythm.  Pulmonary/Chest: No respiratory distress. She has no wheezes.  Abdominal: Soft. Bowel sounds are normal. She exhibits no distension and no mass. There is no abdominal tenderness. There is no rebound and no guarding.  Musculoskeletal:        General: No tenderness or edema. Normal range of motion.     Cervical back: Normal range of motion and neck supple.  Neurological: She is alert and oriented to person, place, and time.  Skin: Skin is warm.  Psychiatric: Affect normal.   LABORATORY DATA:  I have reviewed the data as listed    Component Value Date/Time   NA 138 04/15/2020 1025   NA 139 05/30/2014 0127   K 3.9 04/15/2020 1025   K 3.7 05/30/2014 0127   CL 99 04/15/2020 1025   CL 106 05/30/2014 0127   CO2 28 04/15/2020 1025   CO2 25 05/30/2014 0127   GLUCOSE 110 (H) 04/15/2020 1025   GLUCOSE 136 (H) 05/30/2014 0127   BUN 15 04/15/2020 1025   BUN 19 (H) 05/30/2014 0127   CREATININE 1.21 (H) 04/15/2020 1025   CREATININE 0.98 05/30/2014 0127   CALCIUM 9.3 04/15/2020 1025    CALCIUM 9.0 05/30/2014 0127   PROT 7.8 04/15/2020 1025   PROT 7.8 05/08/2014 1131   ALBUMIN 4.0 04/15/2020 1025   ALBUMIN 3.7 05/08/2014 1131   AST 19 04/15/2020 1025   AST 20 05/08/2014 1131   ALT 13 04/15/2020 1025   ALT 18 05/08/2014 1131   ALKPHOS 46 04/15/2020 1025   ALKPHOS 58 05/08/2014 1131   BILITOT 0.7 04/15/2020 1025   BILITOT 0.4 05/08/2014 1131   GFRNONAA 43 (L) 04/15/2020 1025   GFRNONAA 58 (L) 05/30/2014 0127   GFRAA 50 (L) 04/15/2020 1025   GFRAA >60 05/30/2014 0127    No results found for: SPEP, UPEP  Lab Results  Component Value Date   WBC 6.0 04/15/2020   NEUTROABS 3.2 04/15/2020   HGB 11.6 (L) 04/15/2020   HCT 36.8 04/15/2020   MCV 98.7 04/15/2020   PLT 235 04/15/2020      Chemistry      Component Value Date/Time   NA 138 04/15/2020 1025   NA 139 05/30/2014 0127   K 3.9 04/15/2020 1025   K 3.7 05/30/2014 0127   CL 99 04/15/2020 1025   CL 106 05/30/2014 0127   CO2 28 04/15/2020 1025   CO2 25 05/30/2014 0127   BUN 15 04/15/2020 1025   BUN 19 (H) 05/30/2014 0127   CREATININE 1.21 (H) 04/15/2020 1025   CREATININE 0.98 05/30/2014 0127      Component Value Date/Time   CALCIUM 9.3 04/15/2020 1025   CALCIUM 9.0 05/30/2014 0127   ALKPHOS 46 04/15/2020 1025   ALKPHOS 58 05/08/2014 1131   AST 19 04/15/2020 1025   AST 20 05/08/2014 1131   ALT 13 04/15/2020 1025   ALT 18 05/08/2014 1131   BILITOT 0.7 04/15/2020 1025   BILITOT 0.4 05/08/2014 1131        RADIOGRAPHIC STUDIES: I have personally reviewed the radiological images as listed and agreed with the findings in the report. No results found.   ASSESSMENT & PLAN:   Malignant neoplasm of overlapping sites of left breast in female, estrogen receptor positive ([place]) #Left breast ipsilateral chest wall recurrence stage IV [2015]; currently on Faslodex plus abema. DEC 22nd 2020 PET scan-improved mediastinal adenopathy/right pleural disease; right iliac lesion.  Tumor markers stable.   Clinically - STABLE. -except for bilateral hip pain/fatigue [see below]  #  Continue Faslodex+ abema 52m BID. On Abema 50 mg BID. Labs today reviewed;  acceptable for treatment today.   # fatifue  #   #Bilateral hip pain worse- etiology is unclear; x-ray mild arthritis otherwise no acute process.  Cannot be MRI for further evaluation.  awaitig  orthopedics.  # Mild anemia hemoglobin 10-11; STABLE;  Stool test 1/3 positive. Would recommend GI evaluation.   # HTN- at home 140-150ss.  Continue Norvasc to 10 mg a day Continue lisinopril.  Stable.   # CKD- stage III- STABLE.   # Diarrhea- G-1; sec to Vernzionio..Marland Kitchenimoidum   # DISPOSITION:   # shot today # left breast mastectomy bra prosthesis # follow up with MD/labs- cbc/cmp/in 4 weeks 27-29; Faslodex; -Dr.B     GCammie Sickle MD 04/15/2020 2:12 PM

## 2020-04-16 DIAGNOSIS — M479 Spondylosis, unspecified: Secondary | ICD-10-CM | POA: Diagnosis not present

## 2020-04-16 DIAGNOSIS — M5136 Other intervertebral disc degeneration, lumbar region: Secondary | ICD-10-CM | POA: Diagnosis not present

## 2020-04-16 LAB — CANCER ANTIGEN 27.29: CA 27.29: 35.8 U/mL (ref 0.0–38.6)

## 2020-04-24 ENCOUNTER — Other Ambulatory Visit: Payer: Self-pay | Admitting: Internal Medicine

## 2020-04-29 DIAGNOSIS — C50112 Malignant neoplasm of central portion of left female breast: Secondary | ICD-10-CM | POA: Diagnosis not present

## 2020-04-29 DIAGNOSIS — Z4432 Encounter for fitting and adjustment of external left breast prosthesis: Secondary | ICD-10-CM | POA: Diagnosis not present

## 2020-05-07 DIAGNOSIS — M479 Spondylosis, unspecified: Secondary | ICD-10-CM | POA: Diagnosis not present

## 2020-05-11 DIAGNOSIS — Z4432 Encounter for fitting and adjustment of external left breast prosthesis: Secondary | ICD-10-CM | POA: Diagnosis not present

## 2020-05-11 DIAGNOSIS — C50112 Malignant neoplasm of central portion of left female breast: Secondary | ICD-10-CM | POA: Diagnosis not present

## 2020-05-13 ENCOUNTER — Inpatient Hospital Stay (HOSPITAL_BASED_OUTPATIENT_CLINIC_OR_DEPARTMENT_OTHER): Payer: Medicare HMO | Admitting: Internal Medicine

## 2020-05-13 ENCOUNTER — Other Ambulatory Visit: Payer: Self-pay

## 2020-05-13 ENCOUNTER — Inpatient Hospital Stay: Payer: Medicare HMO | Attending: Internal Medicine

## 2020-05-13 ENCOUNTER — Inpatient Hospital Stay: Payer: Medicare HMO

## 2020-05-13 DIAGNOSIS — C50812 Malignant neoplasm of overlapping sites of left female breast: Secondary | ICD-10-CM

## 2020-05-13 DIAGNOSIS — Z5111 Encounter for antineoplastic chemotherapy: Secondary | ICD-10-CM | POA: Insufficient documentation

## 2020-05-13 DIAGNOSIS — Z170 Estrogen receptor positive status [ER+]: Secondary | ICD-10-CM | POA: Diagnosis not present

## 2020-05-13 LAB — CBC WITH DIFFERENTIAL/PLATELET
Abs Immature Granulocytes: 0.02 10*3/uL (ref 0.00–0.07)
Basophils Absolute: 0 10*3/uL (ref 0.0–0.1)
Basophils Relative: 1 %
Eosinophils Absolute: 0.2 10*3/uL (ref 0.0–0.5)
Eosinophils Relative: 3 %
HCT: 35.1 % — ABNORMAL LOW (ref 36.0–46.0)
Hemoglobin: 11.2 g/dL — ABNORMAL LOW (ref 12.0–15.0)
Immature Granulocytes: 0 %
Lymphocytes Relative: 30 %
Lymphs Abs: 2.1 10*3/uL (ref 0.7–4.0)
MCH: 31.1 pg (ref 26.0–34.0)
MCHC: 31.9 g/dL (ref 30.0–36.0)
MCV: 97.5 fL (ref 80.0–100.0)
Monocytes Absolute: 0.7 10*3/uL (ref 0.1–1.0)
Monocytes Relative: 9 %
Neutro Abs: 4 10*3/uL (ref 1.7–7.7)
Neutrophils Relative %: 57 %
Platelets: 201 10*3/uL (ref 150–400)
RBC: 3.6 MIL/uL — ABNORMAL LOW (ref 3.87–5.11)
RDW: 12.5 % (ref 11.5–15.5)
WBC: 7.1 10*3/uL (ref 4.0–10.5)
nRBC: 0 % (ref 0.0–0.2)

## 2020-05-13 LAB — COMPREHENSIVE METABOLIC PANEL
ALT: 14 U/L (ref 0–44)
AST: 19 U/L (ref 15–41)
Albumin: 4 g/dL (ref 3.5–5.0)
Alkaline Phosphatase: 57 U/L (ref 38–126)
Anion gap: 8 (ref 5–15)
BUN: 20 mg/dL (ref 8–23)
CO2: 29 mmol/L (ref 22–32)
Calcium: 9.1 mg/dL (ref 8.9–10.3)
Chloride: 102 mmol/L (ref 98–111)
Creatinine, Ser: 1.11 mg/dL — ABNORMAL HIGH (ref 0.44–1.00)
GFR calc Af Amer: 55 mL/min — ABNORMAL LOW (ref >60)
GFR calc non Af Amer: 48 mL/min — ABNORMAL LOW (ref >60)
Glucose, Bld: 112 mg/dL — ABNORMAL HIGH (ref 70–99)
Potassium: 4.7 mmol/L (ref 3.5–5.1)
Sodium: 139 mmol/L (ref 135–145)
Total Bilirubin: 0.8 mg/dL (ref 0.3–1.2)
Total Protein: 7.6 g/dL (ref 6.5–8.1)

## 2020-05-13 MED ORDER — FULVESTRANT 250 MG/5ML IM SOLN
500.0000 mg | Freq: Once | INTRAMUSCULAR | Status: AC
  Administered 2020-05-13: 500 mg via INTRAMUSCULAR
  Filled 2020-05-13: qty 10

## 2020-05-13 MED ORDER — ABEMACICLIB 50 MG PO TABS: 50.0000 mg | ORAL_TABLET | Freq: Two times a day (BID) | ORAL | 3 refills | Status: DC

## 2020-05-13 MED FILL — VERZENIO 50 MG TABS: 50 | 28 days supply | Qty: 56 | Fill #0

## 2020-05-13 NOTE — Progress Notes (Signed)
[place] OFFICE PROGRESS NOTE  Patient Care Team: [name], MD as PCP - General (Internal Medicine) Bary Castilla, Forest Gleason, MD (General Surgery) Marden Noble, MD (Internal Medicine) [name], MD as Consulting Physician (Internal Medicine)  Dr.Charles Mayer Masker; [place]  SUMMARY OF ONCOLOGIC HISTORY:  Oncology History Overview Note  # 2007- LEFT BREAST CA STAGE II [T2N27m; s/p mastec; Dr.Byrnett] ER-Pos/PR Neg; Her- 2 Neu- NEG; ACx4-Taxol x12;Femara; stopped May 2009-stopped follow up.  #JAN 2015- Post mastectomy Chest wall recurrence [chest wall mass bx-]; ER- 90%; PR- NEG; Her2 Neu- NEG; s/p excision [involving skeletal muscle/adipose tissue; clear margins]; CT July 2017- NED;   # Jan 2020- progression/ hilar- START abema [jan 14th]+ faslodex[jan 13th]  # OSTEOPENIA [BMD- June 2016]  # Kidney cysts  DIAGNOSIS: LEFT BREAST CA  STAGE: IV- NED  ;GOALS: pallaitive  CURRENT/MOST RECENT THERAPY: faslodex+ Abema    Breast cancer metastasized to skin ([facility]  02/05/2014 Initial Diagnosis   Breast cancer metastasized to skin ([facility]   Malignant neoplasm of overlapping sites of left breast in female, estrogen receptor positive ([facility]  07/24/2016 Initial Diagnosis   Cancer of overlapping sites of left female breast ([facility]   01/06/2019 -  Chemotherapy   The patient had fulvestrant (FASLODEX) injection 500 mg, 500 mg, Intramuscular,  Once, 1 of 4 cycles Dose modification: 500 mg (original dose 500 mg, Cycle 1)  for chemotherapy treatment.     INTERVAL HISTORY: Accompanied by daughter.  A very pleasant 89year old African-American female patient with above history of ER/PR positive breast cancer stage IV currently on Faslodex + Verzenio is here for follow-up.  Patient taken off Verzenio because of diarrhea at last visit approximately low.  Patient's hip pain is improved.  She is currently on MiraLAX and/NSAIDs.   No nausea no vomiting.   Diarrhea improved.  Review of Systems  Constitutional: Positive for malaise/fatigue. Negative for chills, diaphoresis, fever and weight loss.  HENT: Negative for nosebleeds and sore throat.   Eyes: Negative for double vision.  Respiratory: Negative for cough, hemoptysis, sputum production, shortness of breath and wheezing.   Cardiovascular: Negative for chest pain, palpitations, orthopnea and leg swelling.  Gastrointestinal: Negative for abdominal pain, blood in stool, constipation, diarrhea, heartburn, melena, nausea and vomiting.  Musculoskeletal: Positive for back pain and joint pain.  Skin: Negative.  Negative for itching and rash.  Neurological: Negative for tingling, focal weakness, weakness and headaches.  Endo/Heme/Allergies: Does not bruise/bleed easily.  Psychiatric/Behavioral: Negative for depression. The patient is not nervous/anxious and does not have insomnia.    PAST MEDICAL HISTORY :  Past Medical History:  Diagnosis Date  . Aneurysm ([facility] 2007  . Arthritis   . Cancer ([facility] 2007   diagnosed 01/07 left breast with simple mastectomu & sn bx. The pt had a 3.5cm tumor. Frozen section report on the 2 sn were negative. On permanent sections a 0.513mmicrometastasis was identified. She was not felt to require a complete axillary dissection. She has completed her Tamoxifen therapy and has been released from routine f/u with medical oncology service.  . Diabetes mellitus without complication ([facility]  . Hyperlipidemia   . Hypertension    since age 79. Malignant [facility] of [facility]outer quadrant of female [facility]   Mastectomy site recurrence resected 01/26/2014, ER 90%, PR 0%, HER-2/neu nonamplified.  . Other benign neoplasm of connective and other soft tissue of trunk, unspecified 2014  . Personal history of colonic polyps   .  Personal history of tobacco use, presenting hazards to health   . Special screening for malignant neoplasms, colon   . Unspecified  disorder of skin and subcutaneous tissue 02/19/2013   biopsy of skin over the sternum on the right breast showed hypertrophic scar,keloid formation.    PAST SURGICAL HISTORY :   Past Surgical History:  Procedure Laterality Date  . ABDOMINAL HYSTERECTOMY  2004   total  . BREAST BIOPSY Right January 26, 2014   Core biopsy for mild increase breast uptake on PET scan, usual ductal hyperplasia and stromal fibrosis  . BREAST SURGERY Left 2007   mastectomy  . chest wall mass  01/26/14  . CHOLECYSTECTOMY  2007  . COLONOSCOPY W/ POLYPECTOMY  2011   Dr. Brynda Greathouse  . EYE SURGERY Right    cataract surgery  . head surgery  1991  . MASTECTOMY Left 2007  . PORT-A-CATH REMOVAL  2008  . PORTACATH PLACEMENT  2007    FAMILY HISTORY :   Family History  Problem Relation Age of Onset  . Cancer Other        ovarian,breast,colon cancers;relations not listed  . Breast cancer Neg Hx     SOCIAL HISTORY:   Social History   Tobacco Use  . Smoking status: Current Some Day Smoker    Packs/day: 1.00    Years: 50.00    Pack years: 50.00    Types: Cigarettes  . Smokeless tobacco: Never Used  Substance Use Topics  . Alcohol use: No    Alcohol/week: 0.0 standard drinks  . Drug use: No    ALLERGIES:  is allergic to metoprolol.  MEDICATIONS:  Current Outpatient Medications  Medication Sig Dispense Refill  . amLODipine (NORVASC) 10 MG tablet TAKE 1 TABLET BY MOUTH EVERY DAY 90 tablet 1  . atorvastatin (LIPITOR) 20 MG tablet Take 20 mg by mouth daily at 6 PM.     . Calcium Carbonate-Vitamin D 600-200 MG-UNIT CAPS Take 200 capsules by mouth once.    . DUREZOL 0.05 % EMUL     . lisinopril (ZESTRIL) 2.5 MG tablet Take 1 tablet (2.5 mg total) by mouth daily. 90 tablet 1  . meloxicam (MOBIC) 15 MG tablet Take 1 tablet by mouth daily.    . methocarbamol (ROBAXIN) 500 MG tablet Take 1 tablet by mouth in the morning and at bedtime.    Marland Kitchen PROLENSA 0.07 % SOLN Place 1 drop into the left eye at bedtime.    Marland Kitchen  abemaciclib (VERZENIO) 50 MG tablet Take 1 tablet (50 mg total) by mouth 2 (two) times daily. 60 tablet 3  . citalopram (CELEXA) 40 MG tablet Take 1 tablet (40 mg total) by mouth daily. 90 tablet 1   No current facility-administered medications for this visit.    PHYSICAL EXAMINATION: ECOG PERFORMANCE STATUS: 0 - Asymptomatic  BP (!) 173/77   Pulse 68   Temp 97.7 F (36.5 C) (Tympanic)   Resp 20   There were no vitals filed for this visit.  Physical Exam  Constitutional: She is oriented to person, place, and time and well-developed, well-nourished, and in no distress.    She is walking by self.  HENT:  Head: Normocephalic and atraumatic.  Mouth/Throat: Oropharynx is clear and moist. No oropharyngeal exudate.  Eyes: Pupils are equal, round, and reactive to light.  Cardiovascular: Normal rate and regular rhythm.  Pulmonary/Chest: No respiratory distress. She has no wheezes.  Abdominal: Soft. Bowel sounds are normal. She exhibits no distension and no mass. There is  no abdominal tenderness. There is no rebound and no guarding.  Musculoskeletal:        General: No tenderness or edema. Normal range of motion.     Cervical back: Normal range of motion and neck supple.  Neurological: She is alert and oriented to person, place, and time.  Skin: Skin is warm.  Psychiatric: Affect normal.   LABORATORY DATA:  I have reviewed the data as listed    Component Value Date/Time   NA 139 05/13/2020 1007   NA 139 05/30/2014 0127   K 4.7 05/13/2020 1007   K 3.7 05/30/2014 0127   CL 102 05/13/2020 1007   CL 106 05/30/2014 0127   CO2 29 05/13/2020 1007   CO2 25 05/30/2014 0127   GLUCOSE 112 (H) 05/13/2020 1007   GLUCOSE 136 (H) 05/30/2014 0127   BUN 20 05/13/2020 1007   BUN 19 (H) 05/30/2014 0127   CREATININE 1.11 (H) 05/13/2020 1007   CREATININE 0.98 05/30/2014 0127   CALCIUM 9.1 05/13/2020 1007   CALCIUM 9.0 05/30/2014 0127   PROT 7.6 05/13/2020 1007   PROT 7.8 05/08/2014 1131    ALBUMIN 4.0 05/13/2020 1007   ALBUMIN 3.7 05/08/2014 1131   AST 19 05/13/2020 1007   AST 20 05/08/2014 1131   ALT 14 05/13/2020 1007   ALT 18 05/08/2014 1131   ALKPHOS 57 05/13/2020 1007   ALKPHOS 58 05/08/2014 1131   BILITOT 0.8 05/13/2020 1007   BILITOT 0.4 05/08/2014 1131   GFRNONAA 48 (L) 05/13/2020 1007   GFRNONAA 58 (L) 05/30/2014 0127   GFRAA 55 (L) 05/13/2020 1007   GFRAA >60 05/30/2014 0127    No results found for: SPEP, UPEP  Lab Results  Component Value Date   WBC 7.1 05/13/2020   NEUTROABS 4.0 05/13/2020   HGB 11.2 (L) 05/13/2020   HCT 35.1 (L) 05/13/2020   MCV 97.5 05/13/2020   PLT 201 05/13/2020      Chemistry      Component Value Date/Time   NA 139 05/13/2020 1007   NA 139 05/30/2014 0127   K 4.7 05/13/2020 1007   K 3.7 05/30/2014 0127   CL 102 05/13/2020 1007   CL 106 05/30/2014 0127   CO2 29 05/13/2020 1007   CO2 25 05/30/2014 0127   BUN 20 05/13/2020 1007   BUN 19 (H) 05/30/2014 0127   CREATININE 1.11 (H) 05/13/2020 1007   CREATININE 0.98 05/30/2014 0127      Component Value Date/Time   CALCIUM 9.1 05/13/2020 1007   CALCIUM 9.0 05/30/2014 0127   ALKPHOS 57 05/13/2020 1007   ALKPHOS 58 05/08/2014 1131   AST 19 05/13/2020 1007   AST 20 05/08/2014 1131   ALT 14 05/13/2020 1007   ALT 18 05/08/2014 1131   BILITOT 0.8 05/13/2020 1007   BILITOT 0.4 05/08/2014 1131        RADIOGRAPHIC STUDIES: I have personally reviewed the radiological images as listed and agreed with the findings in the report. No results found.   ASSESSMENT & PLAN:   Malignant neoplasm of overlapping sites of left breast in female, estrogen receptor positive ([place]) #Left breast ipsilateral chest wall recurrence stage IV [2015]; currently on Faslodex plus abema. DEC 22nd 2020 PET scan-improved mediastinal adenopathy/right pleural disease; right iliac lesion.  Tumor markers stable.  Clinically - STABLE.  Except for bilateral hip pain/fatigue [see below]  #I would  recommend proceeding with Faslodex today; recommend restarting Verzenio at 50 mg twice daily.  New prescription given.  # Fatigue/diarrhea-likely  secondary to Verzenio-improved.  Recommend restarting Verzenio.  Discussed with patient monitoring closely for symptoms of diarrhea again.  #Bilateral hip pain- arthritis- improved- [s/p ortho evaluation]; anti-inflammatory/methotrexate.  # Mild anemia hemoglobin 10-11; stable; Stool test 1/3 positive. Would recommend GI evaluation.  Await PET scan.  # HTN- at home 140-150ss.  Continue Norvasc to 10 mg a day Continue lisinopril.  Stable  # CKD- stage III- STABLE.   # DISPOSITION:   # shot today # follow up with MD/labs- cbc/cmp/in 4 weeks 27-29; Faslodex;{PET prior -Dr.B     [name], MD 05/13/2020 2:20 PM

## 2020-05-13 NOTE — Assessment & Plan Note (Addendum)
#  Left breast ipsilateral chest wall recurrence stage IV [2015]; currently on Faslodex plus abema. DEC 22nd 2020 PET scan-improved mediastinal adenopathy/right pleural disease; right iliac lesion.  Tumor markers stable.  Clinically - STABLE.  Except for bilateral hip pain/fatigue [see below]  #I would recommend proceeding with Faslodex today; recommend restarting Verzenio at 50 mg twice daily.  New prescription given.  # Fatigue/diarrhea-likely secondary to Verzenio-improved.  Recommend restarting Verzenio.  Discussed with patient monitoring closely for symptoms of diarrhea again.  #Bilateral hip pain- arthritis- improved- [s/p ortho evaluation]; anti-inflammatory/methotrexate.  # Mild anemia hemoglobin 10-11; stable; Stool test 1/3 positive. Would recommend GI evaluation.  Await PET scan.  # HTN- at home 140-150ss.  Continue Norvasc to 10 mg a day Continue lisinopril.  Stable  # CKD- stage III- STABLE.   # DISPOSITION:   # shot today # follow up with MD/labs- cbc/cmp/in 4 weeks 27-29; Faslodex;{PET prior -Dr.B

## 2020-05-14 LAB — CANCER ANTIGEN 27.29: CA 27.29: 28.1 U/mL (ref 0.0–38.6)

## 2020-06-03 ENCOUNTER — Ambulatory Visit
Admission: RE | Admit: 2020-06-03 | Discharge: 2020-06-03 | Disposition: A | Discharge status: Home / Self Care | Payer: Medicare HMO | Source: Ambulatory Visit | Attending: Internal Medicine | Admitting: Internal Medicine

## 2020-06-03 ENCOUNTER — Other Ambulatory Visit: Payer: Self-pay

## 2020-06-03 DIAGNOSIS — C50912 Malignant neoplasm of unspecified site of left female breast: Secondary | ICD-10-CM | POA: Diagnosis not present

## 2020-06-03 DIAGNOSIS — M5136 Other intervertebral disc degeneration, lumbar region: Secondary | ICD-10-CM | POA: Diagnosis not present

## 2020-06-03 DIAGNOSIS — Z01812 Encounter for preprocedural laboratory examination: Secondary | ICD-10-CM | POA: Diagnosis present

## 2020-06-03 DIAGNOSIS — M47816 Spondylosis without myelopathy or radiculopathy, lumbar region: Secondary | ICD-10-CM | POA: Insufficient documentation

## 2020-06-03 DIAGNOSIS — C50812 Malignant neoplasm of overlapping sites of left female breast: Secondary | ICD-10-CM | POA: Diagnosis not present

## 2020-06-03 DIAGNOSIS — K5730 Diverticulosis of large intestine without perforation or abscess without bleeding: Secondary | ICD-10-CM | POA: Insufficient documentation

## 2020-06-03 DIAGNOSIS — Q638 Other specified congenital malformations of kidney: Secondary | ICD-10-CM | POA: Diagnosis not present

## 2020-06-03 DIAGNOSIS — Z170 Estrogen receptor positive status [ER+]: Secondary | ICD-10-CM | POA: Insufficient documentation

## 2020-06-03 LAB — GLUCOSE, CAPILLARY: Glucose-Capillary: 92 mg/dL (ref 70–99)

## 2020-06-03 MED ORDER — FLUDEOXYGLUCOSE F - 18 (FDG) INJECTION
7.6000 | Freq: Once | INTRAVENOUS | Status: AC | PRN
  Administered 2020-06-03: 8.157 via INTRAVENOUS

## 2020-06-05 ENCOUNTER — Telehealth: Payer: Self-pay | Admitting: Internal Medicine

## 2020-06-05 NOTE — Telephone Encounter (Signed)
On 6/11- spoke to pt's daughter re: results of the PET scan. Follow up as planned. GB

## 2020-06-10 ENCOUNTER — Other Ambulatory Visit: Payer: Self-pay

## 2020-06-10 ENCOUNTER — Inpatient Hospital Stay: Payer: Medicare HMO | Attending: Internal Medicine

## 2020-06-10 ENCOUNTER — Inpatient Hospital Stay (HOSPITAL_BASED_OUTPATIENT_CLINIC_OR_DEPARTMENT_OTHER): Payer: Medicare HMO | Admitting: Internal Medicine

## 2020-06-10 ENCOUNTER — Inpatient Hospital Stay: Payer: Medicare HMO

## 2020-06-10 ENCOUNTER — Encounter: Payer: Self-pay | Admitting: Internal Medicine

## 2020-06-10 DIAGNOSIS — C50812 Malignant neoplasm of overlapping sites of left female breast: Secondary | ICD-10-CM

## 2020-06-10 DIAGNOSIS — Z170 Estrogen receptor positive status [ER+]: Secondary | ICD-10-CM | POA: Diagnosis not present

## 2020-06-10 DIAGNOSIS — Z5111 Encounter for antineoplastic chemotherapy: Secondary | ICD-10-CM | POA: Diagnosis not present

## 2020-06-10 DIAGNOSIS — C792 Secondary malignant neoplasm of skin: Secondary | ICD-10-CM | POA: Insufficient documentation

## 2020-06-10 LAB — CBC WITH DIFFERENTIAL/PLATELET
Abs Immature Granulocytes: 0.01 10*3/uL (ref 0.00–0.07)
Basophils Absolute: 0 10*3/uL (ref 0.0–0.1)
Basophils Relative: 1 %
Eosinophils Absolute: 0.2 10*3/uL (ref 0.0–0.5)
Eosinophils Relative: 4 %
HCT: 35.4 % — ABNORMAL LOW (ref 36.0–46.0)
Hemoglobin: 11.3 g/dL — ABNORMAL LOW (ref 12.0–15.0)
Immature Granulocytes: 0 %
Lymphocytes Relative: 43 %
Lymphs Abs: 2.1 10*3/uL (ref 0.7–4.0)
MCH: 30.9 pg (ref 26.0–34.0)
MCHC: 31.9 g/dL (ref 30.0–36.0)
MCV: 96.7 fL (ref 80.0–100.0)
Monocytes Absolute: 0.4 10*3/uL (ref 0.1–1.0)
Monocytes Relative: 9 %
Neutro Abs: 2.1 10*3/uL (ref 1.7–7.7)
Neutrophils Relative %: 43 %
Platelets: 215 10*3/uL (ref 150–400)
RBC: 3.66 MIL/uL — ABNORMAL LOW (ref 3.87–5.11)
RDW: 12.4 % (ref 11.5–15.5)
WBC: 4.8 10*3/uL (ref 4.0–10.5)
nRBC: 0 % (ref 0.0–0.2)

## 2020-06-10 LAB — COMPREHENSIVE METABOLIC PANEL
ALT: 12 U/L (ref 0–44)
AST: 19 U/L (ref 15–41)
Albumin: 3.8 g/dL (ref 3.5–5.0)
Alkaline Phosphatase: 61 U/L (ref 38–126)
Anion gap: 10 (ref 5–15)
BUN: 23 mg/dL (ref 8–23)
CO2: 28 mmol/L (ref 22–32)
Calcium: 9 mg/dL (ref 8.9–10.3)
Chloride: 101 mmol/L (ref 98–111)
Creatinine, Ser: 1.11 mg/dL — ABNORMAL HIGH (ref 0.44–1.00)
GFR calc Af Amer: 55 mL/min — ABNORMAL LOW (ref >60)
GFR calc non Af Amer: 48 mL/min — ABNORMAL LOW (ref >60)
Glucose, Bld: 121 mg/dL — ABNORMAL HIGH (ref 70–99)
Potassium: 4.7 mmol/L (ref 3.5–5.1)
Sodium: 139 mmol/L (ref 135–145)
Total Bilirubin: 0.7 mg/dL (ref 0.3–1.2)
Total Protein: 7.7 g/dL (ref 6.5–8.1)

## 2020-06-10 MED ORDER — FULVESTRANT 250 MG/5ML IM SOLN
500.0000 mg | Freq: Once | INTRAMUSCULAR | Status: AC
  Administered 2020-06-10: 500 mg via INTRAMUSCULAR
  Filled 2020-06-10: qty 10

## 2020-06-10 NOTE — Assessment & Plan Note (Addendum)
#  Left breast ipsilateral chest wall recurrence stage IV [2015]; currently on Faslodex plus abema. June 14th PET scan-improved mediastinal adenopathy/right pleural disease; right iliac lesion.  Tumor markers stable.  Clinically - Stable.    #I would recommend proceeding with Faslodex today; recommend restarting Verzenio at 50 mg twice daily.   #Bilateral hip pain- arthritis- STABLE; [s/p ortho evaluation]; anti-inflammatory/methotrexate.  # Mild anemia hemoglobin 10-11; stable; Stool test 1/3 positive. Would recommend GI evaluation.   # HTN- at home 140-150ss.  Continue Norvasc to 10 mg a day Continue lisinopril.  Stable  # CKD- stage III- STABLE.   # DISPOSITION:   # shot today # follow up in 4 weeks- with MD/labs- cbc/cmp/iron studies/ferritin; ca- 27-29; Faslodex;-Dr.B  # I reviewed the blood work- with the patient in detail; also reviewed the imaging independently [as summarized above]; and with the patient in detail.

## 2020-06-10 NOTE — Progress Notes (Signed)
[place] OFFICE PROGRESS NOTE  Patient Care Team: [name], MD as PCP - General (Internal Medicine) Bary Castilla, Forest Gleason, MD (General Surgery) Marden Noble, MD (Internal Medicine) [name], MD as Consulting Physician (Internal Medicine)  Dr.Charles Mayer Masker; [place]  SUMMARY OF ONCOLOGIC HISTORY:  Oncology History Overview Note  # 2007- LEFT BREAST CA STAGE II [T2N8m; s/p mastec; Dr.Byrnett] ER-Pos/PR Neg; Her- 2 Neu- NEG; ACx4-Taxol x12;Femara; stopped May 2009-stopped follow up.  #JAN 2015- Post mastectomy Chest wall recurrence [chest wall mass bx-]; ER- 90%; PR- NEG; Her2 Neu- NEG; s/p excision [involving skeletal muscle/adipose tissue; clear margins]; CT July 2017- NED;   # Jan 2020- progression/ hilar- START abema [jan 14th]+ faslodex[jan 13th]  # OSTEOPENIA [BMD- June 2016]  # Kidney cysts  DIAGNOSIS: LEFT BREAST CA  STAGE: IV- NED  ;GOALS: pallaitive  CURRENT/MOST RECENT THERAPY: faslodex+ Abema    Breast cancer [place]asized to skin ([place]  02/05/2014 Initial Diagnosis   Breast cancer [place]asized to skin ([facility]   Malignant neoplasm of overlapping sites of left breast in female, estrogen receptor positive ([facility]  07/24/2016 Initial Diagnosis   Cancer of overlapping sites of left female breast ([facility]   01/06/2019 -  Chemotherapy   The patient had fulvestrant (FASLODEX) injection 500 mg, 500 mg, Intramuscular,  Once, 1 of 4 cycles Dose modification: 500 mg (original dose 500 mg, Cycle 1)  for chemotherapy treatment.     INTERVAL HISTORY: Accompanied by family  [name] patient with above history of ER/PR positive breast cancer stage IV currently on Faslodex + Verzenio is here for follow-up/review results of the PET scan.  Patient denies any diarrhea.  Denies any nausea vomiting.  Hip pain is improved.  Review of Systems  Constitutional: Positive for malaise/fatigue. Negative  for chills, diaphoresis, fever and weight loss.  HENT: Negative for nosebleeds and sore throat.   Eyes: Negative for double vision.  Respiratory: Negative for cough, hemoptysis, sputum production, shortness of breath and wheezing.   Cardiovascular: Negative for chest pain, palpitations, orthopnea and leg swelling.  Gastrointestinal: Negative for abdominal pain, blood in stool, constipation, diarrhea, heartburn, melena, nausea and vomiting.  Musculoskeletal: Positive for back pain and joint pain.  Skin: Negative.  Negative for itching and rash.  Neurological: Negative for tingling, focal weakness, weakness and headaches.  Endo/Heme/Allergies: Does not bruise/bleed easily.  Psychiatric/Behavioral: Negative for depression. The patient is not nervous/anxious and does not have insomnia.    PAST MEDICAL HISTORY :  Past Medical History:  Diagnosis Date  . Aneurysm ([facility] 2007  . Arthritis   . Cancer ([facility] 2007   diagnosed 01/07 left breast with simple mastectomu & sn bx. The pt had a 3.5cm tumor. Frozen section report on the 2 sn were negative. On permanent sections a 0.563mmicrometastasis was identified. She was not felt to require a complete axillary dissection. She has completed her Tamoxifen therapy and has been released from routine f/u with medical oncology service.  . Diabetes [place] without complication ([facility]  . Hyperlipidemia   . Hypertension    since age 79. [facility]   Mastectomy site recurrence resected 01/26/2014, ER 90%, PR 0%, HER-2/neu nonamplified.  . Other benign neoplasm of connective and other soft tissue of trunk, unspecified 2014  . Personal history of colonic polyps   . Personal history of tobacco use, presenting hazards to health   . Special  screening for malignant neoplasms, colon   . Unspecified disorder of skin and subcutaneous tissue 02/19/2013   biopsy of skin over the sternum on the right  breast showed hypertrophic scar,keloid formation.    PAST SURGICAL HISTORY :   Past Surgical History:  Procedure Laterality Date  . ABDOMINAL HYSTERECTOMY  2004   total  . BREAST BIOPSY Right January 26, 2014   Core biopsy for mild increase breast uptake on PET scan, usual ductal hyperplasia and stromal fibrosis  . BREAST SURGERY Left 2007   mastectomy  . chest wall mass  01/26/14  . CHOLECYSTECTOMY  2007  . COLONOSCOPY W/ POLYPECTOMY  2011   Dr. Brynda Greathouse  . EYE SURGERY Right    cataract surgery  . head surgery  1991  . MASTECTOMY Left 2007  . PORT-A-CATH REMOVAL  2008  . PORTACATH PLACEMENT  2007    FAMILY HISTORY :   Family History  Problem Relation Age of Onset  . Cancer Other        ovarian,breast,colon cancers;relations not listed  . Breast cancer Neg Hx     SOCIAL HISTORY:   Social History   Tobacco Use  . Smoking status: Current Some Day Smoker    Packs/day: 1.00    Years: 50.00    Pack years: 50.00    Types: Cigarettes  . Smokeless tobacco: Never Used  Substance Use Topics  . Alcohol use: No    Alcohol/week: 0.0 standard drinks  . Drug use: No    ALLERGIES:  is allergic to metoprolol.  MEDICATIONS:  Current Outpatient Medications  Medication Sig Dispense Refill  . abemaciclib (VERZENIO) 50 MG tablet Take 1 tablet (50 mg total) by mouth 2 (two) times daily. 60 tablet 3  . amLODipine (NORVASC) 10 MG tablet TAKE 1 TABLET BY MOUTH EVERY DAY 90 tablet 1  . atorvastatin (LIPITOR) 20 MG tablet Take 20 mg by mouth daily at 6 PM.     . Calcium Carbonate-Vitamin D 600-200 MG-UNIT CAPS Take 200 capsules by mouth once.    . citalopram (CELEXA) 40 MG tablet Take 1 tablet (40 mg total) by mouth daily. 90 tablet 1  . lisinopril (ZESTRIL) 2.5 MG tablet Take 1 tablet (2.5 mg total) by mouth daily. 90 tablet 1  . meloxicam (MOBIC) 15 MG tablet Take 1 tablet by mouth daily.    . methocarbamol (ROBAXIN) 500 MG tablet Take 1 tablet by mouth in the morning and at bedtime.     . DUREZOL 0.05 % EMUL  (Patient not taking: Reported on 06/10/2020)    . PROLENSA 0.07 % SOLN Place 1 drop into the left eye at bedtime. (Patient not taking: Reported on 06/10/2020)     No current facility-administered medications for this visit.    PHYSICAL EXAMINATION: ECOG PERFORMANCE STATUS: 0 - Asymptomatic  BP 133/70   Pulse 64   Temp (!) 97.3 F (36.3 C) (Oral)   Resp 16   Ht 5' 6"  (1.676 m)   Wt 146 lb (66.2 kg)   SpO2 100%   BMI 23.57 kg/m   Filed Weights   06/10/20 0931  Weight: 146 lb (66.2 kg)    Physical Exam Constitutional:      Comments:   She is walking by self.  HENT:     Head: Normocephalic and atraumatic.     Mouth/Throat:     Pharynx: No oropharyngeal exudate.  Eyes:     Pupils: Pupils are equal, round, and reactive to light.  Cardiovascular:  Rate and Rhythm: Normal rate and regular rhythm.  Pulmonary:     Effort: No respiratory distress.     Breath sounds: No wheezing.  Abdominal:     General: Bowel sounds are normal. There is no distension.     Palpations: Abdomen is soft. There is no mass.     Tenderness: There is no abdominal tenderness. There is no guarding or rebound.  Musculoskeletal:        General: No tenderness. Normal range of motion.     Cervical back: Normal range of motion and neck supple.  Skin:    General: Skin is warm.  Neurological:     Mental Status: She is alert and oriented to person, place, and time.  Psychiatric:        Mood and Affect: Affect normal.    LABORATORY DATA:  I have reviewed the data as listed    Component Value Date/Time   NA 139 06/10/2020 0915   NA 139 05/30/2014 0127   K 4.7 06/10/2020 0915   K 3.7 05/30/2014 0127   CL 101 06/10/2020 0915   CL 106 05/30/2014 0127   CO2 28 06/10/2020 0915   CO2 25 05/30/2014 0127   GLUCOSE 121 (H) 06/10/2020 0915   GLUCOSE 136 (H) 05/30/2014 0127   BUN 23 06/10/2020 0915   BUN 19 (H) 05/30/2014 0127   CREATININE 1.11 (H) 06/10/2020 0915   CREATININE  0.98 05/30/2014 0127   CALCIUM 9.0 06/10/2020 0915   CALCIUM 9.0 05/30/2014 0127   PROT 7.7 06/10/2020 0915   PROT 7.8 05/08/2014 1131   ALBUMIN 3.8 06/10/2020 0915   ALBUMIN 3.7 05/08/2014 1131   AST 19 06/10/2020 0915   AST 20 05/08/2014 1131   ALT 12 06/10/2020 0915   ALT 18 05/08/2014 1131   ALKPHOS 61 06/10/2020 0915   ALKPHOS 58 05/08/2014 1131   BILITOT 0.7 06/10/2020 0915   BILITOT 0.4 05/08/2014 1131   GFRNONAA 48 (L) 06/10/2020 0915   GFRNONAA 58 (L) 05/30/2014 0127   GFRAA 55 (L) 06/10/2020 0915   GFRAA >60 05/30/2014 0127    No results found for: SPEP, UPEP  Lab Results  Component Value Date   WBC 4.8 06/10/2020   NEUTROABS 2.1 06/10/2020   HGB 11.3 (L) 06/10/2020   HCT 35.4 (L) 06/10/2020   MCV 96.7 06/10/2020   PLT 215 06/10/2020      Chemistry      Component Value Date/Time   NA 139 06/10/2020 0915   NA 139 05/30/2014 0127   K 4.7 06/10/2020 0915   K 3.7 05/30/2014 0127   CL 101 06/10/2020 0915   CL 106 05/30/2014 0127   CO2 28 06/10/2020 0915   CO2 25 05/30/2014 0127   BUN 23 06/10/2020 0915   BUN 19 (H) 05/30/2014 0127   CREATININE 1.11 (H) 06/10/2020 0915   CREATININE 0.98 05/30/2014 0127      Component Value Date/Time   CALCIUM 9.0 06/10/2020 0915   CALCIUM 9.0 05/30/2014 0127   ALKPHOS 61 06/10/2020 0915   ALKPHOS 58 05/08/2014 1131   AST 19 06/10/2020 0915   AST 20 05/08/2014 1131   ALT 12 06/10/2020 0915   ALT 18 05/08/2014 1131   BILITOT 0.7 06/10/2020 0915   BILITOT 0.4 05/08/2014 1131        RADIOGRAPHIC STUDIES: I have personally reviewed the radiological images as listed and agreed with the findings in the report. No results found.   ASSESSMENT & PLAN:   Malignant neoplasm  of overlapping sites of left breast in female, estrogen receptor positive ([place]) #Left breast ipsilateral chest wall recurrence stage IV [2015]; currently on Faslodex plus abema. June 14th PET scan-improved mediastinal adenopathy/right pleural  disease; right iliac lesion.  Tumor markers stable.  Clinically - Stable.    #I would recommend proceeding with Faslodex today; recommend restarting Verzenio at 50 mg twice daily.   #Bilateral hip pain- arthritis- STABLE; [s/p ortho evaluation]; anti-inflammatory/methotrexate.  # Mild anemia hemoglobin 10-11; stable; Stool test 1/3 positive. Would recommend GI evaluation.   # HTN- at home 140-150ss.  Continue Norvasc to 10 mg a day Continue lisinopril.  Stable  # CKD- stage III- STABLE.   # DISPOSITION:   # shot today # follow up in 4 weeks- with MD/labs- cbc/cmp/iron studies/ferritin; ca- 27-29; Faslodex;-Dr.B  # I reviewed the blood work- with the patient in detail; also reviewed the imaging independently [as summarized above]; and with the patient in detail.       [name], MD 06/10/2020 10:49 AM

## 2020-06-11 LAB — CANCER ANTIGEN 27.29: CA 27.29: 27.4 U/mL (ref 0.0–38.6)

## 2020-06-15 DIAGNOSIS — H524 Presbyopia: Secondary | ICD-10-CM | POA: Diagnosis not present

## 2020-06-21 MED FILL — VERZENIO 50 MG TABS: 50 | 28 days supply | Qty: 56 | Fill #1

## 2020-07-08 ENCOUNTER — Inpatient Hospital Stay: Payer: Medicare HMO | Attending: Internal Medicine

## 2020-07-08 ENCOUNTER — Inpatient Hospital Stay (HOSPITAL_BASED_OUTPATIENT_CLINIC_OR_DEPARTMENT_OTHER): Payer: Medicare HMO | Admitting: Internal Medicine

## 2020-07-08 ENCOUNTER — Other Ambulatory Visit: Payer: Self-pay

## 2020-07-08 ENCOUNTER — Inpatient Hospital Stay: Payer: Medicare HMO

## 2020-07-08 ENCOUNTER — Encounter: Payer: Self-pay | Admitting: Internal Medicine

## 2020-07-08 DIAGNOSIS — Z170 Estrogen receptor positive status [ER+]: Secondary | ICD-10-CM

## 2020-07-08 DIAGNOSIS — C50812 Malignant neoplasm of overlapping sites of left female breast: Secondary | ICD-10-CM | POA: Diagnosis not present

## 2020-07-08 DIAGNOSIS — C7989 Secondary malignant neoplasm of other specified sites: Secondary | ICD-10-CM | POA: Insufficient documentation

## 2020-07-08 DIAGNOSIS — D649 Anemia, unspecified: Secondary | ICD-10-CM | POA: Insufficient documentation

## 2020-07-08 DIAGNOSIS — Z5111 Encounter for antineoplastic chemotherapy: Secondary | ICD-10-CM | POA: Insufficient documentation

## 2020-07-08 LAB — COMPREHENSIVE METABOLIC PANEL
ALT: 14 U/L (ref 0–44)
AST: 22 U/L (ref 15–41)
Albumin: 3.9 g/dL (ref 3.5–5.0)
Alkaline Phosphatase: 56 U/L (ref 38–126)
Anion gap: 8 (ref 5–15)
BUN: 22 mg/dL (ref 8–23)
CO2: 28 mmol/L (ref 22–32)
Calcium: 8.9 mg/dL (ref 8.9–10.3)
Chloride: 103 mmol/L (ref 98–111)
Creatinine, Ser: 1.13 mg/dL — ABNORMAL HIGH (ref 0.44–1.00)
GFR calc Af Amer: 54 mL/min — ABNORMAL LOW (ref >60)
GFR calc non Af Amer: 47 mL/min — ABNORMAL LOW (ref >60)
Glucose, Bld: 145 mg/dL — ABNORMAL HIGH (ref 70–99)
Potassium: 4.5 mmol/L (ref 3.5–5.1)
Sodium: 139 mmol/L (ref 135–145)
Total Bilirubin: 0.7 mg/dL (ref 0.3–1.2)
Total Protein: 7.8 g/dL (ref 6.5–8.1)

## 2020-07-08 LAB — FERRITIN: Ferritin: 148 ng/mL (ref 11–307)

## 2020-07-08 LAB — CBC WITH DIFFERENTIAL/PLATELET
Abs Immature Granulocytes: 0.01 10*3/uL (ref 0.00–0.07)
Basophils Absolute: 0 10*3/uL (ref 0.0–0.1)
Basophils Relative: 1 %
Eosinophils Absolute: 0.2 10*3/uL (ref 0.0–0.5)
Eosinophils Relative: 4 %
HCT: 35 % — ABNORMAL LOW (ref 36.0–46.0)
Hemoglobin: 11.3 g/dL — ABNORMAL LOW (ref 12.0–15.0)
Immature Granulocytes: 0 %
Lymphocytes Relative: 41 %
Lymphs Abs: 2.1 10*3/uL (ref 0.7–4.0)
MCH: 30.6 pg (ref 26.0–34.0)
MCHC: 32.3 g/dL (ref 30.0–36.0)
MCV: 94.9 fL (ref 80.0–100.0)
Monocytes Absolute: 0.4 10*3/uL (ref 0.1–1.0)
Monocytes Relative: 7 %
Neutro Abs: 2.4 10*3/uL (ref 1.7–7.7)
Neutrophils Relative %: 47 %
Platelets: 218 10*3/uL (ref 150–400)
RBC: 3.69 MIL/uL — ABNORMAL LOW (ref 3.87–5.11)
RDW: 12.8 % (ref 11.5–15.5)
WBC: 5.1 10*3/uL (ref 4.0–10.5)
nRBC: 0 % (ref 0.0–0.2)

## 2020-07-08 LAB — IRON AND TIBC
Iron: 62 ug/dL (ref 28–170)
Saturation Ratios: 17 % (ref 10.4–31.8)
TIBC: 375 ug/dL (ref 250–450)
UIBC: 313 ug/dL

## 2020-07-08 MED ORDER — FULVESTRANT 250 MG/5ML IM SOLN
500.0000 mg | Freq: Once | INTRAMUSCULAR | Status: AC
  Administered 2020-07-08: 500 mg via INTRAMUSCULAR
  Filled 2020-07-08: qty 10

## 2020-07-08 NOTE — Progress Notes (Signed)
[place] OFFICE PROGRESS NOTE  Patient Care Team: [name], MD as PCP - General (Internal Medicine) Bary Castilla, Forest Gleason, MD (General Surgery) Marden Noble, MD (Internal Medicine) [name], MD as Consulting Physician (Internal Medicine)  Dr.Charles Mayer Masker; [place]  SUMMARY OF ONCOLOGIC HISTORY:  Oncology History Overview Note  # 2007- LEFT BREAST CA STAGE II [T2N23m; s/p mastec; Dr.Byrnett] ER-Pos/PR Neg; Her- 2 Neu- NEG; ACx4-Taxol x12;Femara; stopped May 2009-stopped follow up.  #JAN 2015- Post mastectomy Chest wall recurrence [chest wall mass bx-]; ER- 90%; PR- NEG; Her2 Neu- NEG; s/p excision [involving skeletal muscle/adipose tissue; clear margins]; CT July 2017- NED;   # Jan 2020- progression/ hilar- START abema [jan 14th]+ faslodex[jan 13th]  # OSTEOPENIA [BMD- June 2016]  # Kidney cysts  DIAGNOSIS: LEFT BREAST CA  STAGE: IV- NED  ;GOALS: pallaitive  CURRENT/MOST RECENT THERAPY: faslodex+ Abema    Bre[place] cancer [place]as[place] skin ([facility]  02/05/2014 Initial Diagnosis   Breast cancer metastasized to skin ([facility]   Malignant neoplasm of overlapping sites of left breast in female, estrogen receptor positive ([facility]  07/24/2016 Initial Diagnosis   Cancer of overlapping sites of left female breast ([facility]   01/06/2019 -  Chemotherapy   The patient had fulvestrant (FASLODEX) injection 500 mg, 500 mg, Intramuscular,  Once, 1 of 4 cycles Dose modification: 500 mg (original dose 500 mg, Cycle 1)  for chemotherapy treatment.     INTERVAL HISTORY: Accompanied by family  [name] patient with above history of ER/PR positive breast cancer stage IV currently on Faslodex + Verzenio is here for follow-up.  No nausea no vomiting. Appetite is fair. No weight loss. No worsening bone pain.  Review of Systems  Constitutional: Positive for malaise/fatigue. Negative for chills, diaphoresis, fever  and weight loss.  HENT: Negative for nosebleeds and sore throat.   Eyes: Negative for double vision.  Respiratory: Negative for cough, hemoptysis, sputum production, shortness of breath and wheezing.   Cardiovascular: Negative for chest pain, palpitations, orthopnea and leg swelling.  Gastrointestinal: Negative for abdominal pain, blood in stool, constipation, diarrhea, heartburn, melena, nausea and vomiting.  Musculoskeletal: Positive for back pain and joint pain.  Skin: Negative.  Negative for itching and rash.  Neurological: Negative for tingling, focal weakness, weakness and headaches.  Endo/Heme/Allergies: Does not bruise/bleed easily.  Psychiatric/Behavioral: Negative for depression. The patient is not nervous/anxious and does not have insomnia.    PAST MEDICAL HISTORY :  Past Medical History:  Diagnosis Date  . [place] ([facility] [place]  . Arthritis   . Cancer ([facility]   diagnosed 01/07 left breast with simple mastectomu & sn bx. The pt had a 3.5cm tumor. Frozen section report on the 2 sn were negative. On permanent sections a 0.556mmicrometastasis was identified. She was not felt to require a complete axillary dissection. She has completed her Tamoxifen therapy and has been released from routine f/u with medical oncology service.  . Diabetes mellitus without complication ([facility]  . Hyperlipidemia   . Hypertension    since age 79. [facility]   Mastectomy site recurrence resected 01/26/2014, ER 90%, PR 0%, HER-2/neu nonamplified.  . Other benign neoplasm of connective and other soft tissue of trunk, unspecified 2014  . Personal history of colonic polyps   . Personal history of tobacco use, presenting hazards to health   . Special screening for malignant neoplasms, colon   .  Unspecified disorder of skin and subcutaneous tissue 02/19/2013   biopsy of skin over the sternum on the right breast showed hypertrophic  scar,keloid formation.    PAST SURGICAL HISTORY :   Past Surgical History:  Procedure Laterality Date  . ABDOMINAL HYSTERECTOMY  2004   total  . BREAST BIOPSY Right January 26, 2014   Core biopsy for mild increase breast uptake on PET scan, usual ductal hyperplasia and stromal fibrosis  . BREAST SURGERY Left 2007   mastectomy  . chest wall mass  01/26/14  . CHOLECYSTECTOMY  2007  . COLONOSCOPY W/ POLYPECTOMY  2011   Dr. Brynda Greathouse  . EYE SURGERY Right    cataract surgery  . head surgery  1991  . MASTECTOMY Left 2007  . PORT-A-CATH REMOVAL  2008  . PORTACATH PLACEMENT  2007    FAMILY HISTORY :   Family History  Problem Relation Age of Onset  . Cancer Other        ovarian,breast,colon cancers;relations not listed  . Breast cancer Neg Hx     SOCIAL HISTORY:   Social History   Tobacco Use  . Smoking status: Current Some Day Smoker    Packs/day: 1.00    Years: 50.00    Pack years: 50.00    Types: Cigarettes  . Smokeless tobacco: Never Used  Substance Use Topics  . Alcohol use: No    Alcohol/week: 0.0 standard drinks  . Drug use: No    ALLERGIES:  is allergic to metoprolol.  MEDICATIONS:  Current Outpatient Medications  Medication Sig Dispense Refill  . abemaciclib (VERZENIO) 50 MG tablet Take 1 tablet (50 mg total) by mouth 2 (two) times daily. 60 tablet 3  . amLODipine (NORVASC) 10 MG tablet TAKE 1 TABLET BY MOUTH EVERY DAY 90 tablet 1  . atorvastatin (LIPITOR) 20 MG tablet Take 20 mg by mouth daily at 6 PM.     . Calcium Carbonate-Vitamin D 600-200 MG-UNIT CAPS Take 200 capsules by mouth once.    . citalopram (CELEXA) 40 MG tablet Take 1 tablet (40 mg total) by mouth daily. 90 tablet 1  . DUREZOL 0.05 % EMUL     . lisinopril (ZESTRIL) 2.5 MG tablet Take 1 tablet (2.5 mg total) by mouth daily. 90 tablet 1  . meloxicam (MOBIC) 15 MG tablet Take 1 tablet by mouth daily.    . methocarbamol (ROBAXIN) 500 MG tablet Take 1 tablet by mouth in the morning and at bedtime.     Mar[place] Kitchen PROLENSA 0.07 % SOLN Place 1 drop into the left eye at bedtime.      No current facility-administered medications for this visit.    PHYSICAL EXAMINATION: ECOG PERFORMANCE STATUS: 0 - Asymptomatic  BP 137/62 (BP Location: Right Arm, Patient Position: Sitting, Cuff Size: Normal)   Pulse 70   Temp (!) 97.3 F (36.3 C) (Tympanic)   Resp 20   Wt 154 lb 12.8 oz (70.2 kg)   SpO2 100%   BMI 24.99 kg/m   Filed Weights   07/08/20 0945  Weight: 154 lb 12.8 oz (70.2 kg)    Physical Exam Constitutional:      Comments:   She is walking by self.  HENT:     Head: Normocephalic and atraumatic.     Mouth/Throat:     Pharynx: No oropharyngeal exudate.  Eyes:     Pupils: Pupils are equal, round, and reactive to light.  Cardiovascular:     Rate and Rhythm: Normal rate and regular rhythm.  Pulmonary:  Effort: No respiratory distress.     Breath sounds: No wheezing.  Abdominal:     General: Bowel sounds are normal. There is no distension.     Palpations: Abdomen is soft. There is no mass.     Tenderness: There is no abdominal tenderness. There is no guarding or rebound.  Musculoskeletal:        General: No tenderness. Normal range of motion.     Cervical back: Normal range of motion and neck supple.  Skin:    General: Skin is warm.  Neurological:     Mental Status: She is alert and oriented to person, place, and time.  Psychiatric:        Mood and Affect: Affect normal.    LABORATORY DATA:  I have reviewed the data as listed    Component Value Date/Time   NA 139 07/08/2020 0925   NA 139 05/30/2014 0127   K 4.5 07/08/2020 0925   K 3.7 05/30/2014 0127   CL 103 07/08/2020 0925   CL 106 05/30/2014 0127   CO2 28 07/08/2020 0925   CO2 25 05/30/2014 0127   GLUCOSE 145 (H) 07/08/2020 0925   GLUCOSE 136 (H) 05/30/2014 0127   BUN 22 07/08/2020 0925   BUN 19 (H) 05/30/2014 0127   CREATININE 1.13 (H) 07/08/2020 0925   CREATININE 0.98 05/30/2014 0127   CALCIUM 8.9 07/08/2020  0925   CALCIUM 9.0 05/30/2014 0127   PROT 7.8 07/08/2020 0925   PROT 7.8 05/08/2014 1131   ALBUMIN 3.9 07/08/2020 0925   ALBUMIN 3.7 05/08/2014 1131   AST 22 07/08/2020 0925   AST 20 05/08/2014 1131   ALT 14 07/08/2020 0925   ALT 18 05/08/2014 1131   ALKPHOS 56 07/08/2020 0925   ALKPHOS 58 05/08/2014 1131   BILITOT 0.7 07/08/2020 0925   BILITOT 0.4 05/08/2014 1131   GFRNONAA 47 (L) 07/08/2020 0925   GFRNONAA 58 (L) 05/30/2014 0127   GFRAA 54 (L) 07/08/2020 0925   GFRAA >60 05/30/2014 0127    No results found for: SPEP, UPEP  Lab Results  Component Value Date   WBC 5.1 07/08/2020   NEUTROABS 2.4 07/08/2020   HGB 11.3 (L) 07/08/2020   HCT 35.0 (L) 07/08/2020   MCV 94.9 07/08/2020   PLT 218 07/08/2020      Chemistry      Component Value Date/Time   NA 139 07/08/2020 0925   NA 139 05/30/2014 0127   K 4.5 07/08/2020 0925   K 3.7 05/30/2014 0127   CL 103 07/08/2020 0925   CL 106 05/30/2014 0127   CO2 28 07/08/2020 0925   CO2 25 05/30/2014 0127   BUN 22 07/08/2020 0925   BUN 19 (H) 05/30/2014 0127   CREATININE 1.13 (H) 07/08/2020 0925   CREATININE 0.98 05/30/2014 0127      Component Value Date/Time   CALCIUM 8.9 07/08/2020 0925   CALCIUM 9.0 05/30/2014 0127   ALKPHOS 56 07/08/2020 0925   ALKPHOS 58 05/08/2014 1131   AST 22 07/08/2020 0925   AST 20 05/08/2014 1131   ALT 14 07/08/2020 0925   ALT 18 05/08/2014 1131   BILITOT 0.7 07/08/2020 0925   BILITOT 0.4 05/08/2014 1131        RADIOGRAPHIC STUDIES: I have personally reviewed the radiological images as listed and agreed with the findings in the report. No results found.   ASSESSMENT & PLAN:   Malignant neoplasm of overlapping sites of left breast in female, estrogen receptor positive ([place]) #Left breast  ipsilateral chest wall recurrence stage IV [2015]; currently on Faslodex plus abema. June 14th PET scan-improved mediastinal adenopathy/right pleural disease; right iliac lesion.  Tumor markers stable.   Clinically -STABLE    #I would recommend proceeding with Faslodex today; continue Verzenio at 50 mg twice daily.   #Bilateral hip pain- arthritis- STABLE[s/p ortho evaluation]; anti-inflammatory/methotrexate.  # Mild anemia hemoglobin 10-11; stable; Stool test 1/3 positive. Would recommend GI evaluation.   # HTN- at home 140-150ss.  Continue Norvasc to 10 mg a day Continue lisinopril.  Stable  # CKD- stage III- STABLE.   # DISPOSITION:   # shot today # follow up in 4 weeks- with MD/labs- cbc/cmp ca- 27-29; Faslodex;-Dr.B     [name], MD 07/15/2020 12:45 PM

## 2020-07-08 NOTE — Assessment & Plan Note (Addendum)
#  Left breast ipsilateral chest wall recurrence stage IV [2015]; currently on Faslodex plus abema. June 14th PET scan-improved mediastinal adenopathy/right pleural disease; right iliac lesion.  Tumor markers stable.  Clinically -STABLE    #I would recommend proceeding with Faslodex today; continue Verzenio at 50 mg twice daily.   #Bilateral hip pain- arthritis- STABLE[s/p ortho evaluation]; anti-inflammatory/methotrexate.  # Mild anemia hemoglobin 10-11; stable; Stool test 1/3 positive. Would recommend GI evaluation.   # HTN- at home 140-150ss.  Continue Norvasc to 10 mg a day Continue lisinopril.  Stable  # CKD- stage III- STABLE.   # DISPOSITION:   # shot today # follow up in 4 weeks- with MD/labs- cbc/cmp ca- 27-29; Faslodex;-Dr.B

## 2020-07-09 LAB — CANCER ANTIGEN 27.29: CA 27.29: 24.9 U/mL (ref 0.0–38.6)

## 2020-07-29 MED FILL — VERZENIO 50 MG TABS: 50 | 28 days supply | Qty: 56 | Fill #2

## 2020-08-05 ENCOUNTER — Inpatient Hospital Stay: Payer: Medicare HMO

## 2020-08-05 ENCOUNTER — Inpatient Hospital Stay (HOSPITAL_BASED_OUTPATIENT_CLINIC_OR_DEPARTMENT_OTHER): Payer: Medicare HMO | Admitting: Internal Medicine

## 2020-08-05 ENCOUNTER — Other Ambulatory Visit: Payer: Self-pay

## 2020-08-05 ENCOUNTER — Inpatient Hospital Stay: Payer: Medicare HMO | Attending: Internal Medicine

## 2020-08-05 ENCOUNTER — Encounter: Payer: Self-pay | Admitting: Internal Medicine

## 2020-08-05 DIAGNOSIS — Z5111 Encounter for antineoplastic chemotherapy: Secondary | ICD-10-CM | POA: Insufficient documentation

## 2020-08-05 DIAGNOSIS — C50812 Malignant neoplasm of overlapping sites of left female breast: Secondary | ICD-10-CM | POA: Insufficient documentation

## 2020-08-05 DIAGNOSIS — Z170 Estrogen receptor positive status [ER+]: Secondary | ICD-10-CM

## 2020-08-05 LAB — CBC WITH DIFFERENTIAL/PLATELET
Abs Immature Granulocytes: 0.01 10*3/uL (ref 0.00–0.07)
Basophils Absolute: 0 10*3/uL (ref 0.0–0.1)
Basophils Relative: 1 %
Eosinophils Absolute: 0.3 10*3/uL (ref 0.0–0.5)
Eosinophils Relative: 5 %
HCT: 34.4 % — ABNORMAL LOW (ref 36.0–46.0)
Hemoglobin: 11.1 g/dL — ABNORMAL LOW (ref 12.0–15.0)
Immature Granulocytes: 0 %
Lymphocytes Relative: 40 %
Lymphs Abs: 2.4 10*3/uL (ref 0.7–4.0)
MCH: 30.2 pg (ref 26.0–34.0)
MCHC: 32.3 g/dL (ref 30.0–36.0)
MCV: 93.5 fL (ref 80.0–100.0)
Monocytes Absolute: 0.5 10*3/uL (ref 0.1–1.0)
Monocytes Relative: 8 %
Neutro Abs: 2.9 10*3/uL (ref 1.7–7.7)
Neutrophils Relative %: 46 %
Platelets: 191 10*3/uL (ref 150–400)
RBC: 3.68 MIL/uL — ABNORMAL LOW (ref 3.87–5.11)
RDW: 13.2 % (ref 11.5–15.5)
WBC: 6.1 10*3/uL (ref 4.0–10.5)
nRBC: 0 % (ref 0.0–0.2)

## 2020-08-05 LAB — COMPREHENSIVE METABOLIC PANEL
ALT: 14 U/L (ref 0–44)
AST: 19 U/L (ref 15–41)
Albumin: 3.7 g/dL (ref 3.5–5.0)
Alkaline Phosphatase: 51 U/L (ref 38–126)
Anion gap: 8 (ref 5–15)
BUN: 21 mg/dL (ref 8–23)
CO2: 28 mmol/L (ref 22–32)
Calcium: 9.1 mg/dL (ref 8.9–10.3)
Chloride: 105 mmol/L (ref 98–111)
Creatinine, Ser: 1.3 mg/dL — ABNORMAL HIGH (ref 0.44–1.00)
GFR calc Af Amer: 46 mL/min — ABNORMAL LOW (ref >60)
GFR calc non Af Amer: 40 mL/min — ABNORMAL LOW (ref >60)
Glucose, Bld: 113 mg/dL — ABNORMAL HIGH (ref 70–99)
Potassium: 4.3 mmol/L (ref 3.5–5.1)
Sodium: 141 mmol/L (ref 135–145)
Total Bilirubin: 0.7 mg/dL (ref 0.3–1.2)
Total Protein: 7.5 g/dL (ref 6.5–8.1)

## 2020-08-05 MED ORDER — FULVESTRANT 250 MG/5ML IM SOLN
500.0000 mg | Freq: Once | INTRAMUSCULAR | Status: AC
  Administered 2020-08-05: 500 mg via INTRAMUSCULAR
  Filled 2020-08-05: qty 10

## 2020-08-05 NOTE — Assessment & Plan Note (Addendum)
#  Left breast ipsilateral chest wall recurrence stage IV [2015]; currently on Faslodex plus abema. June 14th PET scan-improved mediastinal adenopathy/right pleural disease; right iliac lesion.  Tumor markers stable.  Clinically -STABLE.    #I would recommend proceeding with Faslodex today; continue Verzenio at 50 mg twice daily.   #Bilateral hip pain- arthritis- STABLE; /p ortho evaluation]; anti-inflammatory/methotrexate.  # Mild anemia hemoglobin 10-11/ CKD-; stable; Stool test 1/3 positive. Refer for  GI evaluation. Recommend PO iron every day.   # HTN- at home 140-150ss.  Continue Norvasc to 10 mg a day Continue lisinopril.  Stable  # CKD- stage III- GFR 42- STABLE.   # DISPOSITION: # Refer to Dr.Anna/[facility] re: anemia.    # shot today # follow up in 4 weeks- with MD/labs- cbc/cmp ca- 27-29; Faslodex;-Dr.B

## 2020-08-05 NOTE — Patient Instructions (Signed)
#   recommend iron pill [65 mg] once a day. [over the counter]

## 2020-08-05 NOTE — Progress Notes (Signed)
[place] OFFICE PROGRESS NOTE  Patient Care Team: [name], MD as PCP - General (Internal Medicine) Bary Castilla, Forest Gleason, MD (General Surgery) Marden Noble, MD (Internal Medicine) [name], MD as Consulting Physician (Internal Medicine)  Dr.Charles Mayer Masker; [place]  SUMMARY OF ONCOLOGIC HISTORY:  Oncology History Overview Note  # 2007- LEFT BREAST CA STAGE II [T2N27m; s/p mastec; Dr.Byrnett] ER-Pos/PR Neg; Her- 2 Neu- NEG; ACx4-Taxol x12;Femara; stopped May 2009-stopped follow up.  #JAN 2015- Post mastectomy Chest wall recurrence [chest wall mass bx-]; ER- 90%; PR- NEG; Her2 Neu- NEG; s/p excision [involving skeletal muscle/adipose tissue; clear margins]; CT July 2017- NED;   # Jan 2020- progression/ hilar- START abema [jan 14th]+ faslodex[jan 13th]  # OSTEOPENIA [BMD- June 2016]  # Kidney cysts  DIAGNOSIS: LEFT BREAST CA  STAGE: IV- NED  ;GOALS: pallaitive  CURRENT/MOST RECENT THERAPY: faslodex+ Abema    Breast cancer metastasized to skin ([place]  02/05/2014 Initial Diagnosis   Breast cancer [place]asized to skin ([facility]   Malignant neoplasm of overlapping sites of left breast in female, estrogen receptor positive ([facility]  07/24/2016 Initial Diagnosis   Cancer of overlapping sites of left female breast ([facility]   01/06/2019 -  Chemotherapy   The patient had fulvestrant (FASLODEX) injection 500 mg, 500 mg, Intramuscular,  Once, 1 of 4 cycles Dose modification: 500 mg (original dose 500 mg, Cycle 1) Administration: 500 mg (08/05/2020)  for chemotherapy treatment.     INTERVAL HISTORY: Accompanied by family  [name] patient with above history of ER/PR positive breast cancer stage IV currently on Faslodex + Verzenio is here for follow-up.  Patient continues to have mild hip pain for which she is on meloxicam.  Otherwise no nausea. No vomiting. No diarrhea.   Review of Systems   Constitutional: Positive for malaise/fatigue. Negative for chills, diaphoresis, fever and weight loss.  HENT: Negative for nosebleeds and sore throat.   Eyes: Negative for double vision.  Respiratory: Negative for cough, hemoptysis, sputum production, shortness of breath and wheezing.   Cardiovascular: Negative for chest pain, palpitations, orthopnea and leg swelling.  Gastrointestinal: Negative for abdominal pain, blood in stool, constipation, diarrhea, heartburn, melena, nausea and vomiting.  Musculoskeletal: Positive for back pain and joint pain.  Skin: Negative.  Negative for itching and rash.  Neurological: Negative for tingling, focal weakness, weakness and headaches.  Endo/Heme/Allergies: Does not bruise/bleed easily.  Psychiatric/Behavioral: Negative for depression. The patient is not nervous/anxious and does not have insomnia.    PAST MEDICAL HISTORY :  Past Medical History:  Diagnosis Date  . Aneurysm ([facility] 2007  . Arthritis   . Cancer ([facility] 2007   diagnosed 01/07 left breast with simple mastectomu & sn bx. The pt had a 3.5cm tumor. Frozen section report on the 2 sn were negative. On permanent sections a 0.559mmicrometastasis was identified. She was not felt to require a complete axillary dissection. She has completed her Tamoxifen therapy and has been released from routine f/u with medical oncology service.  . Diabetes mellitus without complication ([facility]  . Hyperlipidemia   . Hypertension    since age 79. [facility]   Mastectomy site recurrence resected 01/26/2014, ER 90%, PR 0%, HER-2/neu nonamplified.  . Other benign neoplasm of connective and other soft tissue of trunk, unspecified 2014  . Personal history of colonic polyps   . Personal history of tobacco use, presenting  hazards to health   . Special screening for malignant neoplasms, colon   . Unspecified disorder of skin and subcutaneous tissue  02/19/2013   biopsy of skin over the sternum on the right breast showed hypertrophic scar,keloid formation.    PAST SURGICAL HISTORY :   Past Surgical History:  Procedure Laterality Date  . ABDOMINAL HYSTERECTOMY  2004   total  . BREAST BIOPSY Right January 26, 2014   Core biopsy for mild increase breast uptake on PET scan, usual ductal hyperplasia and stromal fibrosis  . BREAST SURGERY Left 2007   mastectomy  . chest wall mass  01/26/14  . CHOLECYSTECTOMY  2007  . COLONOSCOPY W/ POLYPECTOMY  2011   Dr. Brynda Greathouse  . EYE SURGERY Right    cataract surgery  . head surgery  1991  . MASTECTOMY Left 2007  . PORT-A-CATH REMOVAL  2008  . PORTACATH PLACEMENT  2007    FAMILY HISTORY :   Family History  Problem Relation Age of Onset  . Cancer Other        ovarian,breast,colon cancers;relations not listed  . Breast cancer Neg Hx     SOCIAL HISTORY:   Social History   Tobacco Use  . Smoking status: Current Some Day Smoker    Packs/day: 1.00    Years: 50.00    Pack years: 50.00    Types: Cigarettes  . Smokeless tobacco: Never Used  Substance Use Topics  . Alcohol use: No    Alcohol/week: 0.0 standard drinks  . Drug use: No    ALLERGIES:  is allergic to metoprolol.  MEDICATIONS:  Current Outpatient Medications  Medication Sig Dispense Refill  . abemaciclib (VERZENIO) 50 MG tablet Take 1 tablet (50 mg total) by mouth 2 (two) times daily. 60 tablet 3  . amLODipine (NORVASC) 10 MG tablet TAKE 1 TABLET BY MOUTH EVERY DAY 90 tablet 1  . atorvastatin (LIPITOR) 20 MG tablet Take 20 mg by mouth daily at 6 PM.     . Calcium Carbonate-Vitamin D 600-200 MG-UNIT CAPS Take 200 capsules by mouth once.    . citalopram (CELEXA) 40 MG tablet Take 1 tablet (40 mg total) by mouth daily. 90 tablet 1  . DUREZOL 0.05 % EMUL     . lisinopril (ZESTRIL) 2.5 MG tablet Take 1 tablet (2.5 mg total) by mouth daily. 90 tablet 1  . meloxicam (MOBIC) 15 MG tablet Take 1 tablet by mouth daily.    .  methocarbamol (ROBAXIN) 500 MG tablet Take 1 tablet by mouth in the morning and at bedtime.    Marland Kitchen PROLENSA 0.07 % SOLN Place 1 drop into the left eye at bedtime.      No current facility-administered medications for this visit.    PHYSICAL EXAMINATION: ECOG PERFORMANCE STATUS: 0 - Asymptomatic  BP (!) 162/66 (BP Location: Left Arm, Patient Position: Sitting, Cuff Size: Large)   Pulse 66   Temp 98.4 F (36.9 C) (Tympanic)   Resp 16   Ht 5' 6"  (1.676 m)   Wt 155 lb 12.8 oz (70.7 kg)   SpO2 100%   BMI 25.15 kg/m   Filed Weights   08/05/20 1044  Weight: 155 lb 12.8 oz (70.7 kg)    Physical Exam Constitutional:      Comments:   She is walking by self.  HENT:     Head: Normocephalic and atraumatic.     Mouth/Throat:     Pharynx: No oropharyngeal exudate.  Eyes:     Pupils: Pupils are  equal, round, and reactive to light.  Cardiovascular:     Rate and Rhythm: Normal rate and regular rhythm.  Pulmonary:     Effort: No respiratory distress.     Breath sounds: No wheezing.  Abdominal:     General: Bowel sounds are normal. There is no distension.     Palpations: Abdomen is soft. There is no mass.     Tenderness: There is no abdominal tenderness. There is no guarding or rebound.  Musculoskeletal:        General: No tenderness. Normal range of motion.     Cervical back: Normal range of motion and neck supple.  Skin:    General: Skin is warm.  Neurological:     Mental Status: She is alert and oriented to person, place, and time.  Psychiatric:        Mood and Affect: Affect normal.    LABORATORY DATA:  I have reviewed the data as listed    Component Value Date/Time   NA 141 08/05/2020 1026   NA 139 05/30/2014 0127   K 4.3 08/05/2020 1026   K 3.7 05/30/2014 0127   CL 105 08/05/2020 1026   CL 106 05/30/2014 0127   CO2 28 08/05/2020 1026   CO2 25 05/30/2014 0127   GLUCOSE 113 (H) 08/05/2020 1026   GLUCOSE 136 (H) 05/30/2014 0127   BUN 21 08/05/2020 1026   BUN 19 (H)  05/30/2014 0127   CREATININE 1.30 (H) 08/05/2020 1026   CREATININE 0.98 05/30/2014 0127   CALCIUM 9.1 08/05/2020 1026   CALCIUM 9.0 05/30/2014 0127   PROT 7.5 08/05/2020 1026   PROT 7.8 05/08/2014 1131   ALBUMIN 3.7 08/05/2020 1026   ALBUMIN 3.7 05/08/2014 1131   AST 19 08/05/2020 1026   AST 20 05/08/2014 1131   ALT 14 08/05/2020 1026   ALT 18 05/08/2014 1131   ALKPHOS 51 08/05/2020 1026   ALKPHOS 58 05/08/2014 1131   BILITOT 0.7 08/05/2020 1026   BILITOT 0.4 05/08/2014 1131   GFRNONAA 40 (L) 08/05/2020 1026   GFRNONAA 58 (L) 05/30/2014 0127   GFRAA 46 (L) 08/05/2020 1026   GFRAA >60 05/30/2014 0127    No results found for: SPEP, UPEP  Lab Results  Component Value Date   WBC 6.1 08/05/2020   NEUTROABS 2.9 08/05/2020   HGB 11.1 (L) 08/05/2020   HCT 34.4 (L) 08/05/2020   MCV 93.5 08/05/2020   PLT 191 08/05/2020      Chemistry      Component Value Date/Time   NA 141 08/05/2020 1026   NA 139 05/30/2014 0127   K 4.3 08/05/2020 1026   K 3.7 05/30/2014 0127   CL 105 08/05/2020 1026   CL 106 05/30/2014 0127   CO2 28 08/05/2020 1026   CO2 25 05/30/2014 0127   BUN 21 08/05/2020 1026   BUN 19 (H) 05/30/2014 0127   CREATININE 1.30 (H) 08/05/2020 1026   CREATININE 0.98 05/30/2014 0127      Component Value Date/Time   CALCIUM 9.1 08/05/2020 1026   CALCIUM 9.0 05/30/2014 0127   ALKPHOS 51 08/05/2020 1026   ALKPHOS 58 05/08/2014 1131   AST 19 08/05/2020 1026   AST 20 05/08/2014 1131   ALT 14 08/05/2020 1026   ALT 18 05/08/2014 1131   BILITOT 0.7 08/05/2020 1026   BILITOT 0.4 05/08/2014 1131        RADIOGRAPHIC STUDIES: I have personally reviewed the radiological images as listed and agreed with the findings in the report.  No results found.   ASSESSMENT & PLAN:   Malignant neoplasm of overlapping sites of left breast in female, estrogen receptor positive ([place]) #Left breast ipsilateral chest wall recurrence stage IV [2015]; currently on Faslodex plus abema.  June 14th PET scan-improved mediastinal adenopathy/right pleural disease; right iliac lesion.  Tumor markers stable.  Clinically -STABLE.    #I would recommend proceeding with Faslodex today; continue Verzenio at 50 mg twice daily.   #Bilateral hip pain- arthritis- STABLE; /p ortho evaluation]; anti-inflammatory/methotrexate.  # Mild anemia hemoglobin 10-11/ CKD-; stable; Stool test 1/3 positive. Refer for  GI evaluation. Recommend PO iron every day.   # HTN- at home 140-150ss.  Continue Norvasc to 10 mg a day Continue lisinopril.  Stable  # CKD- stage III- GFR 42- STABLE.   # DISPOSITION: # Refer to Dr.Anna/[facility] re: anemia.    # shot today # follow up in 4 weeks- with MD/labs- cbc/cmp ca- 27-29; Faslodex;-Dr.B     [name], MD 08/05/2020 12:11 PM

## 2020-08-06 LAB — CANCER ANTIGEN 27.29: CA 27.29: 29 U/mL (ref 0.0–38.6)

## 2020-08-23 MED FILL — VERZENIO 50 MG TABS: 50 | 28 days supply | Qty: 56 | Fill #3

## 2020-09-01 ENCOUNTER — Other Ambulatory Visit: Payer: Self-pay

## 2020-09-01 DIAGNOSIS — D649 Anemia, unspecified: Secondary | ICD-10-CM

## 2020-09-02 ENCOUNTER — Inpatient Hospital Stay: Payer: Medicare HMO

## 2020-09-02 ENCOUNTER — Other Ambulatory Visit: Payer: Self-pay

## 2020-09-02 ENCOUNTER — Inpatient Hospital Stay (HOSPITAL_BASED_OUTPATIENT_CLINIC_OR_DEPARTMENT_OTHER): Payer: Medicare HMO | Admitting: Internal Medicine

## 2020-09-02 ENCOUNTER — Inpatient Hospital Stay: Payer: Medicare HMO | Attending: Internal Medicine

## 2020-09-02 VITALS — BP 153/72 | HR 78 | Temp 98.4°F | Wt 156.2 lb

## 2020-09-02 DIAGNOSIS — C792 Secondary malignant neoplasm of skin: Secondary | ICD-10-CM | POA: Insufficient documentation

## 2020-09-02 DIAGNOSIS — Z170 Estrogen receptor positive status [ER+]: Secondary | ICD-10-CM

## 2020-09-02 DIAGNOSIS — D649 Anemia, unspecified: Secondary | ICD-10-CM | POA: Diagnosis not present

## 2020-09-02 DIAGNOSIS — C50812 Malignant neoplasm of overlapping sites of left female breast: Secondary | ICD-10-CM | POA: Insufficient documentation

## 2020-09-02 DIAGNOSIS — Z5111 Encounter for antineoplastic chemotherapy: Secondary | ICD-10-CM | POA: Insufficient documentation

## 2020-09-02 DIAGNOSIS — C7989 Secondary malignant neoplasm of other specified sites: Secondary | ICD-10-CM | POA: Diagnosis not present

## 2020-09-02 LAB — CBC WITH DIFFERENTIAL/PLATELET
Abs Immature Granulocytes: 0.01 10*3/uL (ref 0.00–0.07)
Basophils Absolute: 0 10*3/uL (ref 0.0–0.1)
Basophils Relative: 1 %
Eosinophils Absolute: 0.2 10*3/uL (ref 0.0–0.5)
Eosinophils Relative: 4 %
HCT: 36.8 % (ref 36.0–46.0)
Hemoglobin: 11.9 g/dL — ABNORMAL LOW (ref 12.0–15.0)
Immature Granulocytes: 0 %
Lymphocytes Relative: 41 %
Lymphs Abs: 2.5 10*3/uL (ref 0.7–4.0)
MCH: 30.1 pg (ref 26.0–34.0)
MCHC: 32.3 g/dL (ref 30.0–36.0)
MCV: 93.2 fL (ref 80.0–100.0)
Monocytes Absolute: 0.4 10*3/uL (ref 0.1–1.0)
Monocytes Relative: 7 %
Neutro Abs: 2.9 10*3/uL (ref 1.7–7.7)
Neutrophils Relative %: 47 %
Platelets: 207 10*3/uL (ref 150–400)
RBC: 3.95 MIL/uL (ref 3.87–5.11)
RDW: 13.3 % (ref 11.5–15.5)
WBC: 6.1 10*3/uL (ref 4.0–10.5)
nRBC: 0 % (ref 0.0–0.2)

## 2020-09-02 LAB — COMPREHENSIVE METABOLIC PANEL
ALT: 13 U/L (ref 0–44)
AST: 21 U/L (ref 15–41)
Albumin: 4.1 g/dL (ref 3.5–5.0)
Alkaline Phosphatase: 54 U/L (ref 38–126)
Anion gap: 12 (ref 5–15)
BUN: 18 mg/dL (ref 8–23)
CO2: 27 mmol/L (ref 22–32)
Calcium: 9.1 mg/dL (ref 8.9–10.3)
Chloride: 101 mmol/L (ref 98–111)
Creatinine, Ser: 1.28 mg/dL — ABNORMAL HIGH (ref 0.44–1.00)
GFR calc Af Amer: 47 mL/min — ABNORMAL LOW (ref >60)
GFR calc non Af Amer: 40 mL/min — ABNORMAL LOW (ref >60)
Glucose, Bld: 134 mg/dL — ABNORMAL HIGH (ref 70–99)
Potassium: 4.1 mmol/L (ref 3.5–5.1)
Sodium: 140 mmol/L (ref 135–145)
Total Bilirubin: 0.7 mg/dL (ref 0.3–1.2)
Total Protein: 8.1 g/dL (ref 6.5–8.1)

## 2020-09-02 MED ORDER — FULVESTRANT 250 MG/5ML IM SOLN
500.0000 mg | Freq: Once | INTRAMUSCULAR | Status: AC
  Administered 2020-09-02: 500 mg via INTRAMUSCULAR
  Filled 2020-09-02: qty 10

## 2020-09-02 NOTE — Assessment & Plan Note (Addendum)
#  Left breast ipsilateral chest wall recurrence stage IV [2015]; currently on Faslodex plus abema. June 14th PET scan-improved mediastinal adenopathy/right pleural disease; right iliac lesion.  Tumor markers stable.  Clinically -STABLE.   #Continue Faslodex today; continue Verzenio at 50 mg twice daily.  Tolerating well.  #Bilateral hip pain- arthritis- STABLE /p ortho evaluation]; anti-inflammatory/methotrexate.  # Mild anemia hemoglobin- 11.9/ CKD-; stable; Stool test 1/3 positive. Refer for  GI evaluation. Recommend PO iron every day.   # HTN- at home 140-150ss.  Continue Norvasc to 10 mg a day Continue lisinopril.  STABLE;   # CKD- stage III- GFR 47- STABLE  # DISPOSITION: # Refer to Dr.Anna/[facility] re: anemia.    # shot today # follow up in 4 weeks- with MD/labs- cbc/cmp ca- 27-29; Faslodex;-Dr.B

## 2020-09-02 NOTE — Progress Notes (Signed)
[place] OFFICE PROGRESS NOTE  Patient Care Team: [name], MD as PCP - General (Internal Medicine) Bary Castilla, Forest Gleason, MD (General Surgery) Marden Noble, MD (Internal Medicine) [name], MD as Consulting Physician (Internal Medicine)  Dr.Charles Mayer Masker; [place]  SUMMARY OF ONCOLOGIC HISTORY:  Oncology History Overview Note  # 2007- LEFT BREAST CA STAGE II [T2N13m; s/p mastec; Dr.Byrnett] ER-Pos/PR Neg; Her- 2 Neu- NEG; ACx4-Taxol x12;Femara; stopped May 2009-stopped follow up.  #JAN 2015- Post mastectomy Chest wall recurrence [chest wall mass bx-]; ER- 90%; PR- NEG; Her2 Neu- NEG; s/p excision [involving skeletal muscle/adipose tissue; clear margins]; CT July 2017- NED;   # Jan 2020- progression/ hilar- START abema [jan 14th]+ faslodex[jan 13th]  # OSTEOPENIA [BMD- June 2016]  # Kidney cysts  DIAGNOSIS: LEFT BREAST CA  STAGE: IV- NED  ;GOALS: pallaitive  CURRENT/MOST RECENT THERAPY: faslodex+ Abema    Breast cancer metastasized to skin ([facility]  02/05/2014 Initial Diagnosis   Breast cancer metastasized to skin ([facility]   Malignant neoplasm of overlapping sites of left breast in female, estrogen receptor positive ([facility]  07/24/2016 Initial Diagnosis   Cancer of overlapping sites of left female breast ([facility]   01/06/2019 -  Chemotherapy   The patient had fulvestrant (FASLODEX) injection 500 mg, 500 mg, Intramuscular,  Once, 1 of 4 cycles Dose modification: 500 mg (original dose 500 mg, Cycle 1) Administration: 500 mg (08/05/2020)  for chemotherapy treatment.     INTERVAL HISTORY: Accompanied by family  [name] patient with above history of ER/PR positive breast cancer stage IV currently on Faslodex + Verzenio is here for follow-up.  Patient denies any worsening diarrhea.  She continues to have mild hip pain for which she is on meloxicam improved.  No nausea no vomiting no abdominal  pain.   Review of Systems  Constitutional: Positive for malaise/fatigue. Negative for chills, diaphoresis, fever and weight loss.  HENT: Negative for nosebleeds and sore throat.   Eyes: Negative for double vision.  Respiratory: Negative for cough, hemoptysis, sputum production, shortness of breath and wheezing.   Cardiovascular: Negative for chest pain, palpitations, orthopnea and leg swelling.  Gastrointestinal: Negative for abdominal pain, blood in stool, constipation, diarrhea, heartburn, melena, nausea and vomiting.  Musculoskeletal: Positive for back pain and joint pain.  Skin: Negative.  Negative for itching and rash.  Neurological: Negative for tingling, focal weakness, weakness and headaches.  Endo/Heme/Allergies: Does not bruise/bleed easily.  Psychiatric/Behavioral: Negative for depression. The patient is not nervous/anxious and does not have insomnia.    PAST MEDICAL HISTORY :  Past Medical History:  Diagnosis Date  . Aneurysm ([facility] 2007  . Arthritis   . Cancer ([facility] 2007   diagnosed 01/07 left breast with simple mastectomu & sn bx. The pt had a 3.5cm tumor. Frozen section report on the 2 sn were negative. On permanent sections a 0.528mmicrometastasis was identified. She was not felt to require a complete axillary dissection. She has completed her Tamoxifen therapy and has been released from routine f/u with medical oncology service.  . Diabetes mellitus without complication ([facility]  . Hyperlipidemia   . Hypertension    since age 79. [facility]   Mastectomy site recurrence resected 01/26/2014, ER 90%, PR 0%, HER-2/neu nonamplified.  . Other benign neoplasm of connective and other soft tissue of trunk, unspecified 2014  . Personal history of colonic polyps   .  Personal history of tobacco use, presenting hazards to health   . Special screening for malignant neoplasms, colon   . Unspecified disorder of skin  and subcutaneous tissue 02/19/2013   biopsy of skin over the sternum on the right breast showed hypertrophic scar,keloid formation.    PAST SURGICAL HISTORY :   Past Surgical History:  Procedure Laterality Date  . ABDOMINAL HYSTERECTOMY  2004   total  . BREAST BIOPSY Right January 26, 2014   Core biopsy for mild increase breast uptake on PET scan, usual ductal hyperplasia and stromal fibrosis  . BREAST SURGERY Left 2007   mastectomy  . chest wall mass  01/26/14  . CHOLECYSTECTOMY  2007  . COLONOSCOPY W/ POLYPECTOMY  2011   Dr. Brynda Greathouse  . EYE SURGERY Right    cataract surgery  . head surgery  1991  . MASTECTOMY Left 2007  . PORT-A-CATH REMOVAL  2008  . PORTACATH PLACEMENT  2007    FAMILY HISTORY :   Family History  Problem Relation Age of Onset  . Cancer Other        ovarian,breast,colon cancers;relations not listed  . Breast cancer Neg Hx     SOCIAL HISTORY:   Social History   Tobacco Use  . Smoking status: Current Some Day Smoker    Packs/day: 1.00    Years: 50.00    Pack years: 50.00    Types: Cigarettes  . Smokeless tobacco: Never Used  Substance Use Topics  . Alcohol use: No    Alcohol/week: 0.0 standard drinks  . Drug use: No    ALLERGIES:  is allergic to metoprolol.  MEDICATIONS:  Current Outpatient Medications  Medication Sig Dispense Refill  . abemaciclib (VERZENIO) 50 MG tablet Take 1 tablet (50 mg total) by mouth 2 (two) times daily. 60 tablet 3  . amLODipine (NORVASC) 10 MG tablet TAKE 1 TABLET BY MOUTH EVERY DAY 90 tablet 1  . atorvastatin (LIPITOR) 20 MG tablet Take 20 mg by mouth daily at 6 PM.     . Calcium Carbonate-Vitamin D 600-200 MG-UNIT CAPS Take 200 capsules by mouth once.    . citalopram (CELEXA) 40 MG tablet Take 1 tablet (40 mg total) by mouth daily. 90 tablet 1  . DUREZOL 0.05 % EMUL     . lisinopril (ZESTRIL) 2.5 MG tablet Take 1 tablet (2.5 mg total) by mouth daily. 90 tablet 1  . meloxicam (MOBIC) 15 MG tablet Take 1 tablet by  mouth daily.    . methocarbamol (ROBAXIN) 500 MG tablet Take 1 tablet by mouth in the morning and at bedtime.    Marland Kitchen PROLENSA 0.07 % SOLN Place 1 drop into the left eye at bedtime.      No current facility-administered medications for this visit.    PHYSICAL EXAMINATION: ECOG PERFORMANCE STATUS: 0 - Asymptomatic  BP (!) 153/72 (BP Location: Right Arm, Patient Position: Sitting)   Pulse 78   Temp 98.4 F (36.9 C) (Oral)   Wt 156 lb 3.2 oz (70.9 kg)   SpO2 100%   BMI 25.21 kg/m   Filed Weights   09/02/20 0938  Weight: 156 lb 3.2 oz (70.9 kg)    Physical Exam Constitutional:      Comments:   She is walking by self.  HENT:     Head: Normocephalic and atraumatic.     Mouth/Throat:     Pharynx: No oropharyngeal exudate.  Eyes:     Pupils: Pupils are equal, round, and reactive to light.  Cardiovascular:  Rate and Rhythm: Normal rate and regular rhythm.  Pulmonary:     Effort: No respiratory distress.     Breath sounds: No wheezing.  Abdominal:     General: Bowel sounds are normal. There is no distension.     Palpations: Abdomen is soft. There is no mass.     Tenderness: There is no abdominal tenderness. There is no guarding or rebound.  Musculoskeletal:        General: No tenderness. Normal range of motion.     Cervical back: Normal range of motion and neck supple.  Skin:    General: Skin is warm.  Neurological:     Mental Status: She is alert and oriented to person, place, and time.  Psychiatric:        Mood and Affect: Affect normal.    LABORATORY DATA:  I have reviewed the data as listed    Component Value Date/Time   NA 140 09/02/2020 0920   NA 139 05/30/2014 0127   K 4.1 09/02/2020 0920   K 3.7 05/30/2014 0127   CL 101 09/02/2020 0920   CL 106 05/30/2014 0127   CO2 27 09/02/2020 0920   CO2 25 05/30/2014 0127   GLUCOSE 134 (H) 09/02/2020 0920   GLUCOSE 136 (H) 05/30/2014 0127   BUN 18 09/02/2020 0920   BUN 19 (H) 05/30/2014 0127   CREATININE 1.28 (H)  09/02/2020 0920   CREATININE 0.98 05/30/2014 0127   CALCIUM 9.1 09/02/2020 0920   CALCIUM 9.0 05/30/2014 0127   PROT 8.1 09/02/2020 0920   PROT 7.8 05/08/2014 1131   ALBUMIN 4.1 09/02/2020 0920   ALBUMIN 3.7 05/08/2014 1131   AST 21 09/02/2020 0920   AST 20 05/08/2014 1131   ALT 13 09/02/2020 0920   ALT 18 05/08/2014 1131   ALKPHOS 54 09/02/2020 0920   ALKPHOS 58 05/08/2014 1131   BILITOT 0.7 09/02/2020 0920   BILITOT 0.4 05/08/2014 1131   GFRNONAA 40 (L) 09/02/2020 0920   GFRNONAA 58 (L) 05/30/2014 0127   GFRAA 47 (L) 09/02/2020 0920   GFRAA >60 05/30/2014 0127    No results found for: SPEP, UPEP  Lab Results  Component Value Date   WBC 6.1 09/02/2020   NEUTROABS 2.9 09/02/2020   HGB 11.9 (L) 09/02/2020   HCT 36.8 09/02/2020   MCV 93.2 09/02/2020   PLT 207 09/02/2020      Chemistry      Component Value Date/Time   NA 140 09/02/2020 0920   NA 139 05/30/2014 0127   K 4.1 09/02/2020 0920   K 3.7 05/30/2014 0127   CL 101 09/02/2020 0920   CL 106 05/30/2014 0127   CO2 27 09/02/2020 0920   CO2 25 05/30/2014 0127   BUN 18 09/02/2020 0920   BUN 19 (H) 05/30/2014 0127   CREATININE 1.28 (H) 09/02/2020 0920   CREATININE 0.98 05/30/2014 0127      Component Value Date/Time   CALCIUM 9.1 09/02/2020 0920   CALCIUM 9.0 05/30/2014 0127   ALKPHOS 54 09/02/2020 0920   ALKPHOS 58 05/08/2014 1131   AST 21 09/02/2020 0920   AST 20 05/08/2014 1131   ALT 13 09/02/2020 0920   ALT 18 05/08/2014 1131   BILITOT 0.7 09/02/2020 0920   BILITOT 0.4 05/08/2014 1131        RADIOGRAPHIC STUDIES: I have personally reviewed the radiological images as listed and agreed with the findings in the report. No results found.   ASSESSMENT & PLAN:   Malignant neoplasm of  overlapping sites of left breast in female, estrogen receptor positive ([place]) #Left breast ipsilateral chest wall recurrence stage IV [2015]; currently on Faslodex plus abema. June 14th PET scan-improved mediastinal  adenopathy/right pleural disease; right iliac lesion.  Tumor markers stable.  Clinically -STABLE.   #Continue Faslodex today; continue Verzenio at 50 mg twice daily.  Tolerating well.  #Bilateral hip pain- arthritis- STABLE /p ortho evaluation]; anti-inflammatory/methotrexate.  # Mild anemia hemoglobin- 11.9/ CKD-; stable; Stool test 1/3 positive. Refer for  GI evaluation. Recommend PO iron every day.   # HTN- at home 140-150ss.  Continue Norvasc to 10 mg a day Continue lisinopril.  STABLE;   # CKD- stage III- GFR 47- STABLE  # DISPOSITION: # Refer to Dr.Anna/[facility] re: anemia.    # shot today # follow up in 4 weeks- with MD/labs- cbc/cmp ca- 27-29; Faslodex;-Dr.B     [name], MD 09/06/2020 8:57 AM

## 2020-09-03 LAB — CANCER ANTIGEN 27.29: CA 27.29: 28.9 U/mL (ref 0.0–38.6)

## 2020-09-10 ENCOUNTER — Encounter: Payer: Self-pay | Admitting: *Deleted

## 2020-09-22 ENCOUNTER — Other Ambulatory Visit: Payer: Self-pay | Admitting: Internal Medicine

## 2020-09-23 MED FILL — VERZENIO 50 MG TABS: 50 | 28 days supply | Qty: 56 | Fill #1

## 2020-09-30 ENCOUNTER — Other Ambulatory Visit: Payer: Self-pay

## 2020-09-30 ENCOUNTER — Encounter: Payer: Self-pay | Admitting: Internal Medicine

## 2020-09-30 ENCOUNTER — Inpatient Hospital Stay: Payer: Medicare HMO | Attending: Internal Medicine

## 2020-09-30 ENCOUNTER — Inpatient Hospital Stay (HOSPITAL_BASED_OUTPATIENT_CLINIC_OR_DEPARTMENT_OTHER): Payer: Medicare HMO | Admitting: Internal Medicine

## 2020-09-30 ENCOUNTER — Inpatient Hospital Stay: Payer: Medicare HMO

## 2020-09-30 VITALS — BP 183/76 | HR 73 | Temp 98.6°F | Resp 16 | Ht 66.0 in | Wt 158.0 lb

## 2020-09-30 DIAGNOSIS — Z170 Estrogen receptor positive status [ER+]: Secondary | ICD-10-CM | POA: Diagnosis not present

## 2020-09-30 DIAGNOSIS — C50812 Malignant neoplasm of overlapping sites of left female breast: Secondary | ICD-10-CM

## 2020-09-30 DIAGNOSIS — D649 Anemia, unspecified: Secondary | ICD-10-CM

## 2020-09-30 DIAGNOSIS — Z5111 Encounter for antineoplastic chemotherapy: Secondary | ICD-10-CM | POA: Diagnosis not present

## 2020-09-30 LAB — COMPREHENSIVE METABOLIC PANEL
ALT: 13 U/L (ref 0–44)
AST: 23 U/L (ref 15–41)
Albumin: 4 g/dL (ref 3.5–5.0)
Alkaline Phosphatase: 56 U/L (ref 38–126)
Anion gap: 10 (ref 5–15)
BUN: 13 mg/dL (ref 8–23)
CO2: 27 mmol/L (ref 22–32)
Calcium: 9.2 mg/dL (ref 8.9–10.3)
Chloride: 105 mmol/L (ref 98–111)
Creatinine, Ser: 1.07 mg/dL — ABNORMAL HIGH (ref 0.44–1.00)
GFR calc non Af Amer: 50 mL/min — ABNORMAL LOW (ref >60)
Glucose, Bld: 146 mg/dL — ABNORMAL HIGH (ref 70–99)
Potassium: 4.2 mmol/L (ref 3.5–5.1)
Sodium: 142 mmol/L (ref 135–145)
Total Bilirubin: 0.7 mg/dL (ref 0.3–1.2)
Total Protein: 7.7 g/dL (ref 6.5–8.1)

## 2020-09-30 LAB — CBC WITH DIFFERENTIAL/PLATELET
Abs Immature Granulocytes: 0.01 10*3/uL (ref 0.00–0.07)
Basophils Absolute: 0 10*3/uL (ref 0.0–0.1)
Basophils Relative: 1 %
Eosinophils Absolute: 0.2 10*3/uL (ref 0.0–0.5)
Eosinophils Relative: 4 %
HCT: 34.2 % — ABNORMAL LOW (ref 36.0–46.0)
Hemoglobin: 10.9 g/dL — ABNORMAL LOW (ref 12.0–15.0)
Immature Granulocytes: 0 %
Lymphocytes Relative: 47 %
Lymphs Abs: 2.5 10*3/uL (ref 0.7–4.0)
MCH: 30.1 pg (ref 26.0–34.0)
MCHC: 31.9 g/dL (ref 30.0–36.0)
MCV: 94.5 fL (ref 80.0–100.0)
Monocytes Absolute: 0.3 10*3/uL (ref 0.1–1.0)
Monocytes Relative: 7 %
Neutro Abs: 2.2 10*3/uL (ref 1.7–7.7)
Neutrophils Relative %: 41 %
Platelets: 212 10*3/uL (ref 150–400)
RBC: 3.62 MIL/uL — ABNORMAL LOW (ref 3.87–5.11)
RDW: 13.6 % (ref 11.5–15.5)
WBC: 5.2 10*3/uL (ref 4.0–10.5)
nRBC: 0 % (ref 0.0–0.2)

## 2020-09-30 MED ORDER — FULVESTRANT 250 MG/5ML IM SOLN
500.0000 mg | Freq: Once | INTRAMUSCULAR | Status: AC
  Administered 2020-09-30: 500 mg via INTRAMUSCULAR
  Filled 2020-09-30: qty 10

## 2020-09-30 MED ORDER — METHOCARBAMOL 500 MG PO TABS
500.0000 mg | ORAL_TABLET | Freq: Two times a day (BID) | ORAL | 3 refills | Status: DC
Start: 2020-09-30 — End: 2021-02-21

## 2020-09-30 NOTE — Assessment & Plan Note (Signed)
#  Left breast ipsilateral chest wall recurrence stage IV [2015]; currently on Faslodex plus abema. June 14th PET scan-improved mediastinal adenopathy/right pleural disease; right iliac lesion.  Tumor markers stable.  Clinically -STABLE.   #Continue Faslodex today; continue Verzenio at 50 mg twice daily.  Tolerating well.  #Bilateral hip pain- arthritis- STABLE /p ortho evaluation]; anti-inflammatory/methotrexate; Robaxin-refilled.  # Mild anemia hemoglobin- 10-11/ CKD-;STABLE; Stool test 1/3 positive. awaiting GI evaluation on 11/30. Recommend PO iron every day.   # HTN- 180s [sister passed away]; at home 140-150ss.  Continue Norvasc to 10 mg a day Continue lisinopril. Recommend close monitoring at home  # CKD- stage III- GFR 47- STABLE  # DISPOSITION: # shot today # follow up in 4 weeks- with MD/labs- cbc/cmp ca- 27-29; Faslodex;-Dr.B

## 2020-09-30 NOTE — Progress Notes (Signed)
[place] OFFICE PROGRESS NOTE  Patient Care Team: [name], MD as PCP - General (Internal Medicine) Bary Castilla, Forest Gleason, MD (General Surgery) Marden Noble, MD (Internal Medicine) [name], MD as Consulting Physician (Internal Medicine)  Dr.Charles Mayer Masker; [place]  SUMMARY OF ONCOLOGIC HISTORY:  Oncology History Overview Note  # 2007- LEFT BREAST CA STAGE II [T2N4m; s/p mastec; Dr.Byrnett] ER-Pos/PR Neg; Her- 2 Neu- NEG; ACx4-Taxol x12;Femara; stopped May 2009-stopped follow up.  #JAN 2015- Post mastectomy Chest wall recurrence [chest wall mass bx-]; ER- 90%; PR- NEG; Her2 Neu- NEG; s/p excision [involving skeletal muscle/adipose tissue; clear margins]; CT July 2017- NED;   # Jan 2020- progression/ hilar- START abema [jan 14th]+ faslodex[jan 13th]  # OSTEOPENIA [BMD- June 2016]  # Kidney cysts  DIAGNOSIS: LEFT BREAST CA  STAGE: IV- NED  ;GOALS: pallaitive  CURRENT/MOST RECENT THERAPY: faslodex+ Abema    Breast cancer metastasized to skin ([place]  02/05/2014 Initial Diagnosis   Breast cancer metastasized to skin ([facility]   Malignant neoplasm of overlapping sites of left breast in female, estrogen receptor positive ([place]  07/24/2016 Initial Diagnosis   Cancer of overlapping sites of left female breast ([place]   01/06/2019 -  Chemotherapy   The patient had fulvestrant (FASLODEX) injection 500 mg, 500 mg, Intramuscular,  Once, 1 of 4 cycles Dose modification: 500 mg (original dose 500 mg, Cycle 1) Administration: 500 mg (08/05/2020)  for chemotherapy treatment.     INTERVAL HISTORY: Accompanied by family  [name] patient with above history of ER/PR positive breast cancer stage IV currently on Faslodex + Verzenio is here for follow-up.  Patient states that her sister died of the complication of diabetes yesterday.  She is quite upset; and states her blood pressure is elevated for the same  reason.  Otherwise no worsening diarrhea.  No worsening joint pains back pain.   Review of Systems  Constitutional: Positive for malaise/fatigue. Negative for chills, diaphoresis, fever and weight loss.  HENT: Negative for nosebleeds and sore throat.   Eyes: Negative for double vision.  Respiratory: Negative for cough, hemoptysis, sputum production, shortness of breath and wheezing.   Cardiovascular: Negative for chest pain, palpitations, orthopnea and leg swelling.  Gastrointestinal: Negative for abdominal pain, blood in stool, constipation, diarrhea, heartburn, melena, nausea and vomiting.  Musculoskeletal: Positive for back pain and joint pain.  Skin: Negative.  Negative for itching and rash.  Neurological: Negative for tingling, focal weakness, weakness and headaches.  Endo/Heme/Allergies: Does not bruise/bleed easily.  Psychiatric/Behavioral: Negative for depression. The patient is not nervous/anxious and does not have insomnia.    PAST MEDICAL HISTORY :  Past Medical History:  Diagnosis Date  . [place]ysm ([place]  . Arthritis   . Cancer ([facility] 2007   diagnosed 01/07 left breast with simple mastectomu & sn bx. The pt had a 3.5cm tumor. Frozen section report on the 2 sn were negative. On permanent sections a 0.59mmicrometastasis was identified. She was not felt to require a complete axillary dissection. She has completed her Tamoxifen therapy and has been released from routine f/u with medical oncology service.  . Diabetes [place] without complication ([place]  . Hyperlipidemia   . Hypertension    since age 79. [facility]   Mastectomy site recurrence resected 01/26/2014, ER 90%, PR 0%, HER-2/neu nonamplified.  . Other benign neoplasm of connective and other soft tissue of  trunk, unspecified 2014  . Personal history of colonic polyps   . Personal history of tobacco use, presenting hazards to health   . Special  screening for malignant neoplasms, colon   . Unspecified disorder of skin and subcutaneous tissue 02/19/2013   biopsy of skin over the sternum on the right breast showed hypertrophic scar,keloid formation.    PAST SURGICAL HISTORY :   Past Surgical History:  Procedure Laterality Date  . ABDOMINAL HYSTERECTOMY  2004   total  . BREAST BIOPSY Right January 26, 2014   Core biopsy for mild increase breast uptake on PET scan, usual ductal hyperplasia and stromal fibrosis  . BREAST SURGERY Left 2007   mastectomy  . chest wall mass  01/26/14  . CHOLECYSTECTOMY  2007  . COLONOSCOPY W/ POLYPECTOMY  2011   Dr. Brynda Greathouse  . EYE SURGERY Right    cataract surgery  . head surgery  1991  . MASTECTOMY Left 2007  . PORT-A-CATH REMOVAL  2008  . PORTACATH PLACEMENT  2007    FAMILY HISTORY :   Family History  Problem Relation Age of Onset  . Cancer Other        ovarian,breast,colon cancers;relations not listed  . Breast cancer Neg Hx     SOCIAL HISTORY:   Social History   Tobacco Use  . Smoking status: Current Some Day Smoker    Packs/day: 1.00    Years: 50.00    Pack years: 50.00    Types: Cigarettes  . Smokeless tobacco: Never Used  Substance Use Topics  . Alcohol use: No    Alcohol/week: 0.0 standard drinks  . Drug use: No    ALLERGIES:  is allergic to metoprolol.  MEDICATIONS:  Current Outpatient Medications  Medication Sig Dispense Refill  . amLODipine (NORVASC) 10 MG tablet TAKE 1 TABLET BY MOUTH EVERY DAY 90 tablet 1  . atorvastatin (LIPITOR) 20 MG tablet Take 20 mg by mouth daily at 6 PM.     . Calcium Carbonate-Vitamin D 600-200 MG-UNIT CAPS Take 200 capsules by mouth once.    . citalopram (CELEXA) 40 MG tablet Take 1 tablet (40 mg total) by mouth daily. 90 tablet 1  . DUREZOL 0.05 % EMUL     . lisinopril (ZESTRIL) 2.5 MG tablet Take 1 tablet (2.5 mg total) by mouth daily. 90 tablet 1  . meloxicam (MOBIC) 15 MG tablet Take 1 tablet by mouth daily.    . methocarbamol  (ROBAXIN) 500 MG tablet Take 1 tablet (500 mg total) by mouth in the morning and at bedtime. 60 tablet 3  . PROLENSA 0.07 % SOLN Place 1 drop into the left eye at bedtime.     Marland Kitchen VERZENIO 50 MG tablet TAKE 1 TABLET BY MOUTH TWO TIMES DAILY 56 tablet 3   No current facility-administered medications for this visit.    PHYSICAL EXAMINATION: ECOG PERFORMANCE STATUS: 0 - Asymptomatic  BP (!) 183/76 (BP Location: Right Arm, Patient Position: Sitting, Cuff Size: Normal)   Pulse 73   Temp 98.6 F (37 C) (Tympanic)   Resp 16   Ht 5' 6"  (1.676 m)   Wt 158 lb (71.7 kg)   SpO2 97%   BMI 25.50 kg/m   Filed Weights   09/30/20 1122  Weight: 158 lb (71.7 kg)    Physical Exam Constitutional:      Comments:   She is walking by self.  HENT:     Head: Normocephalic and atraumatic.     Mouth/Throat:  Pharynx: No oropharyngeal exudate.  Eyes:     Pupils: Pupils are equal, round, and reactive to light.  Cardiovascular:     Rate and Rhythm: Normal rate and regular rhythm.  Pulmonary:     Effort: No respiratory distress.     Breath sounds: No wheezing.  Abdominal:     General: Bowel sounds are normal. There is no distension.     Palpations: Abdomen is soft. There is no mass.     Tenderness: There is no abdominal tenderness. There is no guarding or rebound.  Musculoskeletal:        General: No tenderness. Normal range of motion.     Cervical back: Normal range of motion and neck supple.  Skin:    General: Skin is warm.  Neurological:     Mental Status: She is alert and oriented to person, place, and time.  Psychiatric:        Mood and Affect: Affect normal.    LABORATORY DATA:  I have reviewed the data as listed    Component Value Date/Time   NA 142 09/30/2020 1103   NA 139 05/30/2014 0127   K 4.2 09/30/2020 1103   K 3.7 05/30/2014 0127   CL 105 09/30/2020 1103   CL 106 05/30/2014 0127   CO2 27 09/30/2020 1103   CO2 25 05/30/2014 0127   GLUCOSE 146 (H) 09/30/2020 1103    GLUCOSE 136 (H) 05/30/2014 0127   BUN 13 09/30/2020 1103   BUN 19 (H) 05/30/2014 0127   CREATININE 1.07 (H) 09/30/2020 1103   CREATININE 0.98 05/30/2014 0127   CALCIUM 9.2 09/30/2020 1103   CALCIUM 9.0 05/30/2014 0127   PROT 7.7 09/30/2020 1103   PROT 7.8 05/08/2014 1131   ALBUMIN 4.0 09/30/2020 1103   ALBUMIN 3.7 05/08/2014 1131   AST 23 09/30/2020 1103   AST 20 05/08/2014 1131   ALT 13 09/30/2020 1103   ALT 18 05/08/2014 1131   ALKPHOS 56 09/30/2020 1103   ALKPHOS 58 05/08/2014 1131   BILITOT 0.7 09/30/2020 1103   BILITOT 0.4 05/08/2014 1131   GFRNONAA 50 (L) 09/30/2020 1103   GFRNONAA 58 (L) 05/30/2014 0127   GFRAA 47 (L) 09/02/2020 0920   GFRAA >60 05/30/2014 0127    No results found for: SPEP, UPEP  Lab Results  Component Value Date   WBC 5.2 09/30/2020   NEUTROABS 2.2 09/30/2020   HGB 10.9 (L) 09/30/2020   HCT 34.2 (L) 09/30/2020   MCV 94.5 09/30/2020   PLT 212 09/30/2020      Chemistry      Component Value Date/Time   NA 142 09/30/2020 1103   NA 139 05/30/2014 0127   K 4.2 09/30/2020 1103   K 3.7 05/30/2014 0127   CL 105 09/30/2020 1103   CL 106 05/30/2014 0127   CO2 27 09/30/2020 1103   CO2 25 05/30/2014 0127   BUN 13 09/30/2020 1103   BUN 19 (H) 05/30/2014 0127   CREATININE 1.07 (H) 09/30/2020 1103   CREATININE 0.98 05/30/2014 0127      Component Value Date/Time   CALCIUM 9.2 09/30/2020 1103   CALCIUM 9.0 05/30/2014 0127   ALKPHOS 56 09/30/2020 1103   ALKPHOS 58 05/08/2014 1131   AST 23 09/30/2020 1103   AST 20 05/08/2014 1131   ALT 13 09/30/2020 1103   ALT 18 05/08/2014 1131   BILITOT 0.7 09/30/2020 1103   BILITOT 0.4 05/08/2014 1131        RADIOGRAPHIC STUDIES: I have personally reviewed  the radiological images as listed and agreed with the findings in the report. No results found.   ASSESSMENT & PLAN:   Malignant neoplasm of overlapping sites of left breast in female, estrogen receptor positive ([place]) #Left breast ipsilateral  chest wall recurrence stage IV [2015]; currently on Faslodex plus abema. June 14th PET scan-improved mediastinal adenopathy/right pleural disease; right iliac lesion.  Tumor markers stable.  Clinically -STABLE.   #Continue Faslodex today; continue Verzenio at 50 mg twice daily.  Tolerating well.  #Bilateral hip pain- arthritis- STABLE /p ortho evaluation]; anti-inflammatory/methotrexate; Robaxin-refilled.  # Mild anemia hemoglobin- 10-11/ CKD-;STABLE; Stool test 1/3 positive. awaiting GI evaluation on 11/30. Recommend PO iron every day.   # HTN- 180s [sister passed away]; at home 140-150ss.  Continue Norvasc to 10 mg a day Continue lisinopril. Recommend close monitoring at home  # CKD- stage III- GFR 47- STABLE  # DISPOSITION: # shot today # follow up in 4 weeks- with MD/labs- cbc/cmp ca- 27-29; Faslodex;-Dr.B     [name], MD 10/01/2020 12:34 PM

## 2020-10-01 LAB — CANCER ANTIGEN 27.29: CA 27.29: 30.4 U/mL (ref 0.0–38.6)

## 2020-10-28 ENCOUNTER — Inpatient Hospital Stay (HOSPITAL_BASED_OUTPATIENT_CLINIC_OR_DEPARTMENT_OTHER): Payer: Medicare HMO | Admitting: Internal Medicine

## 2020-10-28 ENCOUNTER — Other Ambulatory Visit: Payer: Self-pay | Admitting: *Deleted

## 2020-10-28 ENCOUNTER — Other Ambulatory Visit: Payer: Self-pay | Admitting: Internal Medicine

## 2020-10-28 ENCOUNTER — Inpatient Hospital Stay: Payer: Medicare HMO | Attending: Internal Medicine

## 2020-10-28 ENCOUNTER — Inpatient Hospital Stay: Payer: Medicare HMO

## 2020-10-28 ENCOUNTER — Encounter: Payer: Self-pay | Admitting: Internal Medicine

## 2020-10-28 VITALS — BP 189/78 | HR 69 | Temp 97.5°F | Resp 16 | Ht 66.0 in | Wt 159.8 lb

## 2020-10-28 DIAGNOSIS — M1990 Unspecified osteoarthritis, unspecified site: Secondary | ICD-10-CM

## 2020-10-28 DIAGNOSIS — C50812 Malignant neoplasm of overlapping sites of left female breast: Secondary | ICD-10-CM | POA: Diagnosis not present

## 2020-10-28 DIAGNOSIS — Z5111 Encounter for antineoplastic chemotherapy: Secondary | ICD-10-CM | POA: Diagnosis not present

## 2020-10-28 DIAGNOSIS — Z79899 Other long term (current) drug therapy: Secondary | ICD-10-CM | POA: Diagnosis not present

## 2020-10-28 DIAGNOSIS — Z170 Estrogen receptor positive status [ER+]: Secondary | ICD-10-CM | POA: Diagnosis not present

## 2020-10-28 DIAGNOSIS — C792 Secondary malignant neoplasm of skin: Secondary | ICD-10-CM | POA: Insufficient documentation

## 2020-10-28 DIAGNOSIS — I159 Secondary hypertension, unspecified: Secondary | ICD-10-CM

## 2020-10-28 LAB — CBC WITH DIFFERENTIAL/PLATELET
Abs Immature Granulocytes: 0.01 10*3/uL (ref 0.00–0.07)
Basophils Absolute: 0 10*3/uL (ref 0.0–0.1)
Basophils Relative: 1 %
Eosinophils Absolute: 0.2 10*3/uL (ref 0.0–0.5)
Eosinophils Relative: 2 %
HCT: 35 % — ABNORMAL LOW (ref 36.0–46.0)
Hemoglobin: 11.3 g/dL — ABNORMAL LOW (ref 12.0–15.0)
Immature Granulocytes: 0 %
Lymphocytes Relative: 40 %
Lymphs Abs: 2.5 10*3/uL (ref 0.7–4.0)
MCH: 30.5 pg (ref 26.0–34.0)
MCHC: 32.3 g/dL (ref 30.0–36.0)
MCV: 94.3 fL (ref 80.0–100.0)
Monocytes Absolute: 0.6 10*3/uL (ref 0.1–1.0)
Monocytes Relative: 9 %
Neutro Abs: 2.9 10*3/uL (ref 1.7–7.7)
Neutrophils Relative %: 48 %
Platelets: 209 10*3/uL (ref 150–400)
RBC: 3.71 MIL/uL — ABNORMAL LOW (ref 3.87–5.11)
RDW: 14.2 % (ref 11.5–15.5)
WBC: 6.1 10*3/uL (ref 4.0–10.5)
nRBC: 0 % (ref 0.0–0.2)

## 2020-10-28 LAB — COMPREHENSIVE METABOLIC PANEL
ALT: 14 U/L (ref 0–44)
AST: 22 U/L (ref 15–41)
Albumin: 3.9 g/dL (ref 3.5–5.0)
Alkaline Phosphatase: 52 U/L (ref 38–126)
Anion gap: 8 (ref 5–15)
BUN: 27 mg/dL — ABNORMAL HIGH (ref 8–23)
CO2: 27 mmol/L (ref 22–32)
Calcium: 9.2 mg/dL (ref 8.9–10.3)
Chloride: 104 mmol/L (ref 98–111)
Creatinine, Ser: 1.13 mg/dL — ABNORMAL HIGH (ref 0.44–1.00)
GFR, Estimated: 50 mL/min — ABNORMAL LOW (ref >60)
Glucose, Bld: 120 mg/dL — ABNORMAL HIGH (ref 70–99)
Potassium: 4.3 mmol/L (ref 3.5–5.1)
Sodium: 139 mmol/L (ref 135–145)
Total Bilirubin: 0.7 mg/dL (ref 0.3–1.2)
Total Protein: 7.8 g/dL (ref 6.5–8.1)

## 2020-10-28 MED ORDER — FULVESTRANT 250 MG/5ML IM SOLN
500.0000 mg | Freq: Once | INTRAMUSCULAR | Status: AC
  Administered 2020-10-28: 500 mg via INTRAMUSCULAR
  Filled 2020-10-28: qty 10

## 2020-10-28 MED ORDER — LISINOPRIL 2.5 MG PO TABS: 2.5000 mg | ORAL_TABLET | Freq: Every day | ORAL | 1 refills | Status: DC

## 2020-10-28 NOTE — Progress Notes (Signed)
[place] OFFICE PROGRESS NOTE  Patient Care Team: [name], MD as PCP - General (Internal Medicine) Bary Castilla, Forest Gleason, MD (General Surgery) Marden Noble, MD (Internal Medicine) [name], MD as Consulting Physician (Internal Medicine)  Dr.Charles Mayer Masker; [place]  SUMMARY OF ONCOLOGIC HISTORY:  Oncology History Overview Note  # 2007- LEFT BREAST CA STAGE II [T2N76m; s/p mastec; Dr.Byrnett] ER-Pos/PR Neg; Her- 2 Neu- NEG; ACx4-Taxol x12;Femara; stopped May 2009-stopped follow up.  #JAN 2015- Post mastectomy Chest wall recurrence [chest wall mass bx-]; ER- 90%; PR- NEG; Her2 Neu- NEG; s/p excision [involving skeletal muscle/adipose tissue; clear margins]; CT July 2017- NED;   # Jan 2020- progression/ hilar- START abema [jan 14th]+ faslodex[jan 13th]  # OSTEOPENIA [BMD- June 2016]  # Kidney cysts  DIAGNOSIS: LEFT BREAST CA  STAGE: IV- NED  ;GOALS: pallaitive  CURRENT/MOST RECENT THERAPY: faslodex+ Abema    Breast cancer metastasized to skin ([place]  02/05/2014 Initial Diagnosis   Breast cancer [place]asized to skin ([place]   Malignant neoplasm of overlapping sites of left breast in female, estrogen receptor positive ([facility]  07/24/2016 Initial Diagnosis   Cancer of overlapping sites of left female breast ([facility]   01/06/2019 -  Chemotherapy   The patient had fulvestrant (FASLODEX) injection 500 mg, 500 mg, Intramuscular,  Once, 1 of 4 cycles Dose modification: 500 mg (original dose 500 mg, Cycle 1) Administration: 500 mg (08/05/2020)  for chemotherapy treatment.     INTERVAL HISTORY: Accompanied by family  [name] patient with above history of ER/PR positive breast cancer stage IV currently on Faslodex + Verzenio is here for follow-up.  Patient denies any nausea vomiting.  No headaches.  No worsening diarrhea no worsening joint pain or back pain.   Review of Systems  Constitutional:  Positive for malaise/fatigue. Negative for chills, diaphoresis, fever and weight loss.  HENT: Negative for nosebleeds and sore throat.   Eyes: Negative for double vision.  Respiratory: Negative for cough, hemoptysis, sputum production, shortness of breath and wheezing.   Cardiovascular: Negative for chest pain, palpitations, orthopnea and leg swelling.  Gastrointestinal: Negative for abdominal pain, blood in stool, constipation, diarrhea, heartburn, melena, nausea and vomiting.  Musculoskeletal: Positive for back pain and joint pain.  Skin: Negative.  Negative for itching and rash.  Neurological: Negative for tingling, focal weakness, weakness and headaches.  Endo/Heme/Allergies: Does not bruise/bleed easily.  Psychiatric/Behavioral: Negative for depression. The patient is not nervous/anxious and does not have insomnia.    PAST MEDICAL HISTORY :  Past Medical History:  Diagnosis Date  . Aneurysm ([place] 2007  . Arthritis   . Cancer ([facility]   diagnosed 01/07 left breast with simple mastectomu & sn bx. The pt had a 3.5cm tumor. Frozen section report on the 2 sn were negative. On permanent sections a 0.556mmicrometastasis was identified. She was not felt to require a complete axillary dissection. She has completed her Tamoxifen therapy and has been released from routine f/u with medical oncology service.  . Diabetes mellitus without complication ([facility] [place]  . Hyperlipidemia   . Hypertension    since age 79. [facility]   Mastectomy site recurrence resected 01/26/2014, ER 90%, PR 0%, HER-2/neu nonamplified.  . Other benign neoplasm of connective and other soft tissue of trunk, unspecified 2014  . Personal history of colonic polyps   . Personal history of tobacco use, presenting hazards to  health   . Special screening for malignant neoplasms, colon   . Unspecified disorder of skin and subcutaneous tissue 02/19/2013   biopsy of  skin over the sternum on the right breast showed hypertrophic scar,keloid formation.    PAST SURGICAL HISTORY :   Past Surgical History:  Procedure Laterality Date  . ABDOMINAL HYSTERECTOMY  2004   total  . BREAST BIOPSY Right January 26, 2014   Core biopsy for mild increase breast uptake on PET scan, usual ductal hyperplasia and stromal fibrosis  . BREAST SURGERY Left 2007   mastectomy  . chest wall mass  01/26/14  . CHOLECYSTECTOMY  2007  . COLONOSCOPY W/ POLYPECTOMY  2011   Dr. Brynda Greathouse  . EYE SURGERY Right    cataract surgery  . head surgery  1991  . MASTECTOMY Left 2007  . PORT-A-CATH REMOVAL  2008  . PORTACATH PLACEMENT  2007    FAMILY HISTORY :   Family History  Problem Relation Age of Onset  . Cancer Other        ovarian,breast,colon cancers;relations not listed  . Breast cancer Neg Hx     SOCIAL HISTORY:   Social History   Tobacco Use  . Smoking status: Current Some Day Smoker    Packs/day: 1.00    Years: 50.00    Pack years: 50.00    Types: Cigarettes  . Smokeless tobacco: Never Used  Substance Use Topics  . Alcohol use: No    Alcohol/week: 0.0 standard drinks  . Drug use: No    ALLERGIES:  is allergic to metoprolol.  MEDICATIONS:  Current Outpatient Medications  Medication Sig Dispense Refill  . amLODipine (NORVASC) 10 MG tablet TAKE 1 TABLET BY MOUTH EVERY DAY 90 tablet 1  . atorvastatin (LIPITOR) 20 MG tablet Take 20 mg by mouth daily at 6 PM.     . Calcium Carbonate-Vitamin D 600-200 MG-UNIT CAPS Take 200 capsules by mouth once.    . citalopram (CELEXA) 40 MG tablet Take 1 tablet (40 mg total) by mouth daily. 90 tablet 1  . DUREZOL 0.05 % EMUL     . lisinopril (ZESTRIL) 2.5 MG tablet Take 1 tablet (2.5 mg total) by mouth daily. 90 tablet 1  . meloxicam (MOBIC) 15 MG tablet Take 1 tablet by mouth daily.    . methocarbamol (ROBAXIN) 500 MG tablet Take 1 tablet (500 mg total) by mouth in the morning and at bedtime. 60 tablet 3  . VERZENIO 50 MG  tablet TAKE 1 TABLET BY MOUTH TWO TIMES DAILY 56 tablet 3   No current facility-administered medications for this visit.   Facility-Administered Medications Ordered in Other Visits  Medication Dose Route Frequency Provider Last Rate Last Admin  . fulvestrant (FASLODEX) injection 500 mg  500 mg Intramuscular Once [name], MD        PHYSICAL EXAMINATION: ECOG PERFORMANCE STATUS: 0 - Asymptomatic  BP (!) 189/78 (BP Location: Right Arm, Patient Position: Sitting, Cuff Size: Normal)   Pulse 69   Temp (!) 97.5 F (36.4 C) (Tympanic)   Resp 16   Ht _0  (1.676 m)   Wt 159 lb 12.8 oz (72.5 kg)   SpO2 100%   BMI 25.79 kg/m   Filed Weights   10/28/20 1033  Weight: 159 lb 12.8 oz (72.5 kg)    Physical Exam Constitutional:      Comments:   She is walking by self.  HENT:     Head: Normocephalic and atraumatic.  Mouth/Throat:     Pharynx: No oropharyngeal exudate.  Eyes:     Pupils: Pupils are equal, round, and reactive to light.  Cardiovascular:     Rate and Rhythm: Normal rate and regular rhythm.  Pulmonary:     Effort: No respiratory distress.     Breath sounds: No wheezing.  Abdominal:     General: Bowel sounds are normal. There is no distension.     Palpations: Abdomen is soft. There is no mass.     Tenderness: There is no abdominal tenderness. There is no guarding or rebound.  Musculoskeletal:        General: No tenderness. Normal range of motion.     Cervical back: Normal range of motion and neck supple.  Skin:    General: Skin is warm.  Neurological:     Mental Status: She is alert and oriented to person, place, and time.  Psychiatric:        Mood and Affect: Affect normal.    LABORATORY DATA:  I have reviewed the data as listed    Component Value Date/Time   NA 139 10/28/2020 1016   NA 139 05/30/2014 0127   K 4.3 10/28/2020 1016   K 3.7 05/30/2014 0127   CL 104 10/28/2020 1016   CL 106 05/30/2014 0127   CO2 27 10/28/2020 1016   CO2 25  05/30/2014 0127   GLUCOSE 120 (H) 10/28/2020 1016   GLUCOSE 136 (H) 05/30/2014 0127   BUN 27 (H) 10/28/2020 1016   BUN 19 (H) 05/30/2014 0127   CREATININE 1.13 (H) 10/28/2020 1016   CREATININE 0.98 05/30/2014 0127   CALCIUM 9.2 10/28/2020 1016   CALCIUM 9.0 05/30/2014 0127   PROT 7.8 10/28/2020 1016   PROT 7.8 05/08/2014 1131   ALBUMIN 3.9 10/28/2020 1016   ALBUMIN 3.7 05/08/2014 1131   AST 22 10/28/2020 1016   AST 20 05/08/2014 1131   ALT 14 10/28/2020 1016   ALT 18 05/08/2014 1131   ALKPHOS 52 10/28/2020 1016   ALKPHOS 58 05/08/2014 1131   BILITOT 0.7 10/28/2020 1016   BILITOT 0.4 05/08/2014 1131   GFRNONAA 50 (L) 10/28/2020 1016   GFRNONAA 58 (L) 05/30/2014 0127   GFRAA 47 (L) 09/02/2020 0920   GFRAA >60 05/30/2014 0127    No results found for: SPEP, UPEP  Lab Results  Component Value Date   WBC 6.1 10/28/2020   NEUTROABS 2.9 10/28/2020   HGB 11.3 (L) 10/28/2020   HCT 35.0 (L) 10/28/2020   MCV 94.3 10/28/2020   PLT 209 10/28/2020      Chemistry      Component Value Date/Time   NA 139 10/28/2020 1016   NA 139 05/30/2014 0127   K 4.3 10/28/2020 1016   K 3.7 05/30/2014 0127   CL 104 10/28/2020 1016   CL 106 05/30/2014 0127   CO2 27 10/28/2020 1016   CO2 25 05/30/2014 0127   BUN 27 (H) 10/28/2020 1016   BUN 19 (H) 05/30/2014 0127   CREATININE 1.13 (H) 10/28/2020 1016   CREATININE 0.98 05/30/2014 0127      Component Value Date/Time   CALCIUM 9.2 10/28/2020 1016   CALCIUM 9.0 05/30/2014 0127   ALKPHOS 52 10/28/2020 1016   ALKPHOS 58 05/08/2014 1131   AST 22 10/28/2020 1016   AST 20 05/08/2014 1131   ALT 14 10/28/2020 1016   ALT 18 05/08/2014 1131   BILITOT 0.7 10/28/2020 1016   BILITOT 0.4 05/08/2014 1131  RADIOGRAPHIC STUDIES: I have personally reviewed the radiological images as listed and agreed with the findings in the report. No results found.   ASSESSMENT & PLAN:   Malignant neoplasm of overlapping sites of left breast in female,  estrogen receptor positive ([place]) #Left breast ipsilateral chest wall recurrence stage IV [2015]; currently on Faslodex plus abema. June 14th PET scan-improved mediastinal adenopathy/right pleural disease; right iliac lesion.  Tumor markers stable.  Clinically- STABLE.    #Continue Faslodex today; continue Verzenio at 50 mg twice daily.  Tolerating well.  #Bilateral hip pain- arthritis- STABLE;  /p ortho evaluation]; on meloxicam.  # Mild anemia hemoglobin- 10-11/ CKD-;STABLE; Stool test 1/3 positive. awaiting colonoscopy evaluation on 11/30. Recommend PO iron every day.   # HTN- 180s [sister passed away]; at home 140-150ss.  Stable; poorly controlled.  Continue Norvasc to 10 mg a day Continue lisinopril. Recommend close monitoring at home; discussed with daughter.   # CKD- stage III- GFR 47- STABLE  # DISPOSITION: # shot today # follow up in 4 weeks- with MD/labs- cbc/cmp ca- 27-29; Faslodex;PET scan-Dr.B     [name], MD 10/28/2020 10:59 AM

## 2020-10-28 NOTE — Progress Notes (Signed)
Spoke with Dr. Rogue Bussing. V/o to renew patient's Lisinopril.

## 2020-10-28 NOTE — Assessment & Plan Note (Addendum)
#  Left breast ipsilateral chest wall recurrence stage IV [2015]; currently on Faslodex plus abema. June 14th PET scan-improved mediastinal adenopathy/right pleural disease; right iliac lesion.  Tumor markers stable.  Clinically- STABLE.    #Continue Faslodex today; continue Verzenio at 50 mg twice daily.  Tolerating well.  We will get PET scan prior to next visit.[if not CT]  #Bilateral hip pain- arthritis- STABLE;  /p ortho evaluation]; on meloxicam.  # Mild anemia hemoglobin- 10-11/ CKD-;STABLE; Stool test 1/3 positive. awaiting colonoscopy evaluation on 11/30. Recommend PO iron every day.   # HTN- 180s [sister passed away]; at home 140-150ss.  Stable; poorly controlled.  Continue Norvasc to 10 mg a day Continue lisinopril. Recommend close monitoring at home; discussed with daughter.   # CKD- stage III- GFR 47- STABLE  # DISPOSITION: # shot today # follow up in 4 weeks- with MD/labs- cbc/cmp ca- 27-29; Faslodex;PET scan-Dr.B

## 2020-10-29 LAB — CANCER ANTIGEN 27.29: CA 27.29: 26.9 U/mL (ref 0.0–38.6)

## 2020-11-16 ENCOUNTER — Encounter
Admission: RE | Admit: 2020-11-16 | Discharge: 2020-11-16 | Disposition: A | Discharge status: Home / Self Care | Payer: Medicare HMO | Source: Ambulatory Visit | Attending: Internal Medicine | Admitting: Internal Medicine

## 2020-11-16 ENCOUNTER — Other Ambulatory Visit: Payer: Self-pay

## 2020-11-16 DIAGNOSIS — C50912 Malignant neoplasm of unspecified site of left female breast: Secondary | ICD-10-CM | POA: Diagnosis not present

## 2020-11-16 DIAGNOSIS — C50812 Malignant neoplasm of overlapping sites of left female breast: Secondary | ICD-10-CM | POA: Insufficient documentation

## 2020-11-16 DIAGNOSIS — N200 Calculus of kidney: Secondary | ICD-10-CM | POA: Diagnosis not present

## 2020-11-16 DIAGNOSIS — J432 Centrilobular emphysema: Secondary | ICD-10-CM | POA: Diagnosis not present

## 2020-11-16 DIAGNOSIS — I2510 Atherosclerotic heart disease of native coronary artery without angina pectoris: Secondary | ICD-10-CM | POA: Diagnosis not present

## 2020-11-16 DIAGNOSIS — Z170 Estrogen receptor positive status [ER+]: Secondary | ICD-10-CM | POA: Diagnosis not present

## 2020-11-16 LAB — GLUCOSE, CAPILLARY: Glucose-Capillary: 75 mg/dL (ref 70–99)

## 2020-11-16 MED ORDER — FLUDEOXYGLUCOSE F - 18 (FDG) INJECTION
8.7700 | Freq: Once | INTRAVENOUS | Status: AC | PRN
  Administered 2020-11-16: 8.77 via INTRAVENOUS

## 2020-11-16 MED FILL — VERZENIO 50 MG TABS: 50 | 28 days supply | Qty: 56 | Fill #2

## 2020-11-22 ENCOUNTER — Telehealth: Payer: Self-pay

## 2020-11-22 NOTE — Telephone Encounter (Signed)
Returned patients call. Patient requested to cancel appt for 11/30. Did not provide a reason as to why.

## 2020-11-23 ENCOUNTER — Ambulatory Visit: Payer: Medicare HMO | Admitting: Gastroenterology

## 2020-11-25 ENCOUNTER — Telehealth: Payer: Self-pay | Admitting: *Deleted

## 2020-11-25 NOTE — Telephone Encounter (Signed)
Reviewed chart - patient cnl her GI apts on 11/30 and has not r/s. Dr. Rogue Bussing - FYI.

## 2020-11-26 ENCOUNTER — Inpatient Hospital Stay (HOSPITAL_BASED_OUTPATIENT_CLINIC_OR_DEPARTMENT_OTHER): Payer: Medicare HMO | Admitting: Internal Medicine

## 2020-11-26 ENCOUNTER — Encounter: Payer: Self-pay | Admitting: Internal Medicine

## 2020-11-26 ENCOUNTER — Other Ambulatory Visit: Payer: Self-pay

## 2020-11-26 ENCOUNTER — Inpatient Hospital Stay: Payer: Medicare HMO | Attending: Internal Medicine

## 2020-11-26 ENCOUNTER — Inpatient Hospital Stay: Payer: Medicare HMO

## 2020-11-26 DIAGNOSIS — N183 Chronic kidney disease, stage 3 unspecified: Secondary | ICD-10-CM | POA: Diagnosis not present

## 2020-11-26 DIAGNOSIS — C792 Secondary malignant neoplasm of skin: Secondary | ICD-10-CM | POA: Insufficient documentation

## 2020-11-26 DIAGNOSIS — Z5111 Encounter for antineoplastic chemotherapy: Secondary | ICD-10-CM | POA: Insufficient documentation

## 2020-11-26 DIAGNOSIS — Z170 Estrogen receptor positive status [ER+]: Secondary | ICD-10-CM

## 2020-11-26 DIAGNOSIS — C50812 Malignant neoplasm of overlapping sites of left female breast: Secondary | ICD-10-CM | POA: Diagnosis not present

## 2020-11-26 DIAGNOSIS — F17210 Nicotine dependence, cigarettes, uncomplicated: Secondary | ICD-10-CM | POA: Insufficient documentation

## 2020-11-26 DIAGNOSIS — Z79899 Other long term (current) drug therapy: Secondary | ICD-10-CM | POA: Insufficient documentation

## 2020-11-26 DIAGNOSIS — Z9012 Acquired absence of left breast and nipple: Secondary | ICD-10-CM | POA: Diagnosis not present

## 2020-11-26 LAB — COMPREHENSIVE METABOLIC PANEL
ALT: 13 U/L (ref 0–44)
AST: 19 U/L (ref 15–41)
Albumin: 3.9 g/dL (ref 3.5–5.0)
Alkaline Phosphatase: 44 U/L (ref 38–126)
Anion gap: 11 (ref 5–15)
BUN: 20 mg/dL (ref 8–23)
CO2: 25 mmol/L (ref 22–32)
Calcium: 9.1 mg/dL (ref 8.9–10.3)
Chloride: 101 mmol/L (ref 98–111)
Creatinine, Ser: 1.34 mg/dL — ABNORMAL HIGH (ref 0.44–1.00)
GFR, Estimated: 41 mL/min — ABNORMAL LOW (ref >60)
Glucose, Bld: 94 mg/dL (ref 70–99)
Potassium: 4.1 mmol/L (ref 3.5–5.1)
Sodium: 137 mmol/L (ref 135–145)
Total Bilirubin: 0.8 mg/dL (ref 0.3–1.2)
Total Protein: 7.6 g/dL (ref 6.5–8.1)

## 2020-11-26 LAB — CBC WITH DIFFERENTIAL/PLATELET
Abs Immature Granulocytes: 0.02 10*3/uL (ref 0.00–0.07)
Basophils Absolute: 0 10*3/uL (ref 0.0–0.1)
Basophils Relative: 1 %
Eosinophils Absolute: 0.2 10*3/uL (ref 0.0–0.5)
Eosinophils Relative: 2 %
HCT: 33.6 % — ABNORMAL LOW (ref 36.0–46.0)
Hemoglobin: 10.7 g/dL — ABNORMAL LOW (ref 12.0–15.0)
Immature Granulocytes: 0 %
Lymphocytes Relative: 39 %
Lymphs Abs: 2.5 10*3/uL (ref 0.7–4.0)
MCH: 30.2 pg (ref 26.0–34.0)
MCHC: 31.8 g/dL (ref 30.0–36.0)
MCV: 94.9 fL (ref 80.0–100.0)
Monocytes Absolute: 0.6 10*3/uL (ref 0.1–1.0)
Monocytes Relative: 9 %
Neutro Abs: 3.2 10*3/uL (ref 1.7–7.7)
Neutrophils Relative %: 49 %
Platelets: 197 10*3/uL (ref 150–400)
RBC: 3.54 MIL/uL — ABNORMAL LOW (ref 3.87–5.11)
RDW: 13.7 % (ref 11.5–15.5)
WBC: 6.4 10*3/uL (ref 4.0–10.5)
nRBC: 0 % (ref 0.0–0.2)

## 2020-11-26 MED ORDER — FULVESTRANT 250 MG/5ML IM SOLN
500.0000 mg | Freq: Once | INTRAMUSCULAR | Status: AC
  Administered 2020-11-26: 500 mg via INTRAMUSCULAR
  Filled 2020-11-26: qty 10

## 2020-11-26 NOTE — Assessment & Plan Note (Addendum)
#  Left breast ipsilateral chest wall recurrence stage IV [2015]; currently on Faslodex plus abema. NOV 24th, 2021 PET [place]- Status post left mastectomy with left axillary lymph node dissection; no distant or recurrent disease. STABLE.   # Continue Faslodex today; continue Verzenio at 50 mg twice daily.  Tolerating well.   # Bilateral hip pain- arthritis- STABLE.  [s/p ortho evaluation]; on meloxicam.  # Mild anemia hemoglobin- 10-11/ CKD-;STABLE; Stool test 1/3 positive. Declines GI evaluation.   # HTN- 140s today- STABLE.  # CKD- stage III- GFR 47- STABLE.   # DISPOSITION: # shot today # follow up in 4 weeks- with X-MD/labs- cbc/cmp ca- 27-29; Faslodex; Dr.B  # I reviewed the blood work- with the patient in detail; also reviewed the imaging independently [as summarized above]; and with the patient in detail.

## 2020-11-26 NOTE — Progress Notes (Signed)
[place] OFFICE PROGRESS NOTE  Patient Care Team: [name], MD as PCP - General (Internal Medicine) Bary Castilla, Forest Gleason, MD (General Surgery) Marden Noble, MD (Internal Medicine) [name], MD as Consulting Physician (Internal Medicine)  Dr.Charles Mayer Masker; [place]  SUMMARY OF ONCOLOGIC HISTORY:  Oncology History Overview Note  # 2007- LEFT BREAST CA STAGE II [T2N57m; s/p mastec; Dr.Byrnett] ER-Pos/PR Neg; Her- 2 Neu- NEG; ACx4-Taxol x12;Femara; stopped May 2009-stopped follow up.  #JAN 2015- Post mastectomy Chest wall recurrence [chest wall mass bx-]; ER- 90%; PR- NEG; Her2 Neu- NEG; s/p excision [involving skeletal muscle/adipose tissue; clear margins]; CT July 2017- NED;   # Jan 2020- progression/ hilar- START abema [jan 14th]+ faslodex[jan 13th]  # DEC 2021- anemia/? CKD [stool ]  # OSTEOPENIA [BMD- June 2016]  # Kidney cysts  DIAGNOSIS: LEFT BREAST CA  STAGE: IV- NED  ;GOALS: pallaitive  CURRENT/MOST RECENT THERAPY: faslodex+ Abema    Breast cancer [place]asized to skin ([facility]  02/05/2014 Initial Diagnosis   Bre[place] cancer [place]asized to skin ([facility]   Malignant neoplasm of overlapping sites of left breast in female, estrogen receptor positive ([facility]  07/24/2016 Initial Diagnosis   Can[facility] of overlapping sites of left female breast ([facility]   01/06/2019 -  Chemotherapy   The patient had fulvestrant (FASLODEX) injection 500 mg, 500 mg, Intramuscular,  Once, 1 of 4 cycles Dose modification: 500 mg (original dose 500 mg, Cycle 1) Administration: 500 mg (08/05/2020)  for chemotherapy treatment.     INTERVAL HISTORY: Accompanied by family  [name] patient with above history of ER/PR positive breast cancer stage IV currently on Faslodex + Verzenio is here for follow-up/ review the results of the PET scan.  Patient denies any nausea vomiting or diarrhea.  Denies any headaches.  Denies any  bone pain.  Review of Systems  Constitutional: Positive for malaise/fatigue. Negative for chills, diaphoresis, fever and weight loss.  HENT: Negative for nosebleeds and sore throat.   Eyes: Negative for double vision.  Respiratory: Negative for cough, hemoptysis, sputum production, shortness of breath and wheezing.   Cardiovascular: Negative for chest pain, palpitations, orthopnea and leg swelling.  Gastrointestinal: Negative for abdominal pain, blood in stool, constipation, diarrhea, heartburn, melena, nausea and vomiting.  Musculoskeletal: Positive for back pain and joint pain.  Skin: Negative.  Negative for itching and rash.  Neurological: Negative for tingling, focal weakness, weakness and headaches.  Endo/Heme/Allergies: Does not bruise/bleed easily.  Psychiatric/Behavioral: Negative for depression. The patient is not nervous/anxious and does not have insomnia.    PAST MEDICAL HISTORY :  Past Medical History:  Diagnosis Date  . Aneurysm ([facility] 2007  . Arthritis   . Cancer ([facility] 2007   diagnosed 01/07 left breast with simple mastectomu & sn bx. The pt had a 3.5cm tumor. Frozen section report on the 2 sn were negative. On permanent sections a 0.543mmicrometastasis was identified. She was not felt to require a complete axillary dissection. She has completed her Tamoxifen therapy and has been released from routine f/u with medical oncology service.  . Diabetes mellitus without complication ([place]  . Hyperlipidemia   . Hypertension    since age 79. [facility]   Mastectomy site recurrence resected 01/26/2014, ER 90%, PR 0%, HER-2/neu nonamplified.  . Other benign neoplasm of connective and other soft tissue of trunk, unspecified 2014  . Personal history of colonic polyps   .  Personal history of tobacco use, presenting hazards to health   . Special screening for malignant neoplasms, colon   . Unspecified disorder of  skin and subcutaneous tissue 02/19/2013   biopsy of skin over the sternum on the right breast showed hypertrophic scar,keloid formation.    PAST SURGICAL HISTORY :   Past Surgical History:  Procedure Laterality Date  . ABDOMINAL HYSTERECTOMY  2004   total  . BREAST BIOPSY Right January 26, 2014   Core biopsy for mild increase breast uptake on PET scan, usual ductal hyperplasia and stromal fibrosis  . BREAST SURGERY Left 2007   mastectomy  . chest wall mass  01/26/14  . CHOLECYSTECTOMY  2007  . COLONOSCOPY W/ POLYPECTOMY  2011   Dr. Brynda Greathouse  . EYE SURGERY Right    cataract surgery  . head surgery  1991  . MASTECTOMY Left 2007  . PORT-A-CATH REMOVAL  2008  . PORTACATH PLACEMENT  2007    FAMILY HISTORY :   Family History  Problem Relation Age of Onset  . Cancer Other        ovarian,breast,colon cancers;relations not listed  . Breast cancer Neg Hx     SOCIAL HISTORY:   Social History   Tobacco Use  . Smoking status: Current Some Day Smoker    Packs/day: 1.00    Years: 50.00    Pack years: 50.00    Types: Cigarettes  . Smokeless tobacco: Never Used  Substance Use Topics  . Alcohol use: No    Alcohol/week: 0.0 standard drinks  . Drug use: No    ALLERGIES:  is allergic to metoprolol.  MEDICATIONS:  Current Outpatient Medications  Medication Sig Dispense Refill  . amLODipine (NORVASC) 10 MG tablet TAKE 1 TABLET BY MOUTH EVERY DAY 90 tablet 1  . atorvastatin (LIPITOR) 20 MG tablet Take 20 mg by mouth daily at 6 PM.     . Calcium Carbonate-Vitamin D 600-200 MG-UNIT CAPS Take 200 capsules by mouth once.    . citalopram (CELEXA) 40 MG tablet Take 1 tablet (40 mg total) by mouth daily. 90 tablet 1  . lisinopril (ZESTRIL) 2.5 MG tablet Take 1 tablet (2.5 mg total) by mouth daily. 90 tablet 1  . meloxicam (MOBIC) 15 MG tablet Take 1 tablet by mouth daily.    . methocarbamol (ROBAXIN) 500 MG tablet Take 1 tablet (500 mg total) by mouth in the morning and at bedtime. 60 tablet  3  . VERZENIO 50 MG tablet TAKE 1 TABLET BY MOUTH TWO TIMES DAILY 56 tablet 3   No current facility-administered medications for this visit.    PHYSICAL EXAMINATION: ECOG PERFORMANCE STATUS: 0 - Asymptomatic  BP 140/67 (BP Location: Right Arm, Patient Position: Sitting, Cuff Size: Normal)   Pulse 76   Temp 99.3 F (37.4 C) (Tympanic)   Resp 16   Ht 5' 6" (1.676 m)   Wt 155 lb (70.3 kg)   SpO2 99%   BMI 25.02 kg/m   Filed Weights   11/26/20 1340  Weight: 155 lb (70.3 kg)    Physical Exam Constitutional:      Comments:   She is walking by self.  HENT:     Head: Normocephalic and atraumatic.     Mouth/Throat:     Pharynx: No oropharyngeal exudate.  Eyes:     Pupils: Pupils are equal, round, and reactive to light.  Cardiovascular:     Rate and Rhythm: Normal rate and regular rhythm.  Pulmonary:     Effort:  No respiratory distress.     Breath sounds: No wheezing.  Abdominal:     General: Bowel sounds are normal. There is no distension.     Palpations: Abdomen is soft. There is no mass.     Tenderness: There is no abdominal tenderness. There is no guarding or rebound.  Musculoskeletal:        General: No tenderness. Normal range of motion.     Cervical back: Normal range of motion and neck supple.  Skin:    General: Skin is warm.  Neurological:     Mental Status: She is alert and oriented to person, place, and time.  Psychiatric:        Mood and Affect: Affect normal.    LABORATORY DATA:  I have reviewed the data as listed    Component Value Date/Time   NA 137 11/26/2020 1327   NA 139 05/30/2014 0127   K 4.1 11/26/2020 1327   K 3.7 05/30/2014 0127   CL 101 11/26/2020 1327   CL 106 05/30/2014 0127   CO2 25 11/26/2020 1327   CO2 25 05/30/2014 0127   GLUCOSE 94 11/26/2020 1327   GLUCOSE 136 (H) 05/30/2014 0127   BUN 20 11/26/2020 1327   BUN 19 (H) 05/30/2014 0127   CREATININE 1.34 (H) 11/26/2020 1327   CREATININE 0.98 05/30/2014 0127   CALCIUM 9.1  11/26/2020 1327   CALCIUM 9.0 05/30/2014 0127   PROT 7.6 11/26/2020 1327   PROT 7.8 05/08/2014 1131   ALBUMIN 3.9 11/26/2020 1327   ALBUMIN 3.7 05/08/2014 1131   AST 19 11/26/2020 1327   AST 20 05/08/2014 1131   ALT 13 11/26/2020 1327   ALT 18 05/08/2014 1131   ALKPHOS 44 11/26/2020 1327   ALKPHOS 58 05/08/2014 1131   BILITOT 0.8 11/26/2020 1327   BILITOT 0.4 05/08/2014 1131   GFRNONAA 41 (L) 11/26/2020 1327   GFRNONAA 58 (L) 05/30/2014 0127   GFRAA 47 (L) 09/02/2020 0920   GFRAA >60 05/30/2014 0127    No results found for: SPEP, UPEP  Lab Results  Component Value Date   WBC 6.4 11/26/2020   NEUTROABS 3.2 11/26/2020   HGB 10.7 (L) 11/26/2020   HCT 33.6 (L) 11/26/2020   MCV 94.9 11/26/2020   PLT 197 11/26/2020      Chemistry      Component Value Date/Time   NA 137 11/26/2020 1327   NA 139 05/30/2014 0127   K 4.1 11/26/2020 1327   K 3.7 05/30/2014 0127   CL 101 11/26/2020 1327   CL 106 05/30/2014 0127   CO2 25 11/26/2020 1327   CO2 25 05/30/2014 0127   BUN 20 11/26/2020 1327   BUN 19 (H) 05/30/2014 0127   CREATININE 1.34 (H) 11/26/2020 1327   CREATININE 0.98 05/30/2014 0127      Component Value Date/Time   CALCIUM 9.1 11/26/2020 1327   CALCIUM 9.0 05/30/2014 0127   ALKPHOS 44 11/26/2020 1327   ALKPHOS 58 05/08/2014 1131   AST 19 11/26/2020 1327   AST 20 05/08/2014 1131   ALT 13 11/26/2020 1327   ALT 18 05/08/2014 1131   BILITOT 0.8 11/26/2020 1327   BILITOT 0.4 05/08/2014 1131        RADIOGRAPHIC STUDIES: I have personally reviewed the radiological images as listed and agreed with the findings in the report. No results found.   ASSESSMENT & PLAN:   Malignant neoplasm of overlapping sites of left breast in female, estrogen receptor positive ([place]) #Left breast ipsilateral chest  wall recurrence stage IV [2015]; currently on Faslodex plus abema. NOV 24th, 2021 PET san- Status post left mastectomy with left axillary lymph node dissection; no distant or  recurrent disease. STABLE.   # Continue Faslodex today; continue Verzenio at 50 mg twice daily.  Tolerating well.   # Bilateral hip pain- arthritis- STABLE.  [s/p ortho evaluation]; on meloxicam.  # Mild anemia hemoglobin- 10-11/ CKD-;STABLE; Stool test 1/3 positive. Declines GI evaluation.   # HTN- 140s today- STABLE.  # CKD- stage III- GFR 47- STABLE.   # DISPOSITION: # shot today # follow up in 4 weeks- with X-MD/labs- cbc/cmp ca- 27-29; Faslodex; Dr.B  # I reviewed the blood work- with the patient in detail; also reviewed the imaging independently [as summarized above]; and with the patient in detail.       [name], MD 11/27/2020 7:46 AM

## 2020-11-27 LAB — CANCER ANTIGEN 27.29: CA 27.29: 28.3 U/mL (ref 0.0–38.6)

## 2020-12-06 ENCOUNTER — Telehealth: Payer: Self-pay | Admitting: *Deleted

## 2020-12-06 DIAGNOSIS — R350 Frequency of micturition: Secondary | ICD-10-CM

## 2020-12-06 NOTE — Telephone Encounter (Signed)
She said everyone is working

## 2020-12-06 NOTE — Telephone Encounter (Signed)
I'd prefer for her to come give [place] a urine sample if she can and I can treat based on results? Thanks!

## 2020-12-06 NOTE — Telephone Encounter (Signed)
Patient states she has no transportation this week to come in. Please advise

## 2020-12-06 NOTE — Telephone Encounter (Signed)
Daughter called and will bring patient in tomorrow at 1 PM, She said she would take off work to bring her in Orders entered

## 2020-12-06 NOTE — Telephone Encounter (Signed)
Patient called requesting prescription for Urinary infection. When questioned regarding her symptoms, she reports that she is having frequency without burning and only voiding small amounts and that she had this in the past and was given antibiotics for it. Please advise

## 2020-12-06 NOTE — Telephone Encounter (Signed)
Is the patient's daughter able to come get a clean/catch container for patient to collect at home?

## 2020-12-07 ENCOUNTER — Inpatient Hospital Stay: Payer: Medicare HMO

## 2020-12-07 ENCOUNTER — Other Ambulatory Visit: Payer: Self-pay | Admitting: Nurse Practitioner

## 2020-12-07 ENCOUNTER — Telehealth: Payer: Self-pay | Admitting: *Deleted

## 2020-12-07 DIAGNOSIS — N183 Chronic kidney disease, stage 3 unspecified: Secondary | ICD-10-CM | POA: Diagnosis not present

## 2020-12-07 DIAGNOSIS — Z79899 Other long term (current) drug therapy: Secondary | ICD-10-CM | POA: Diagnosis not present

## 2020-12-07 DIAGNOSIS — Z170 Estrogen receptor positive status [ER+]: Secondary | ICD-10-CM | POA: Diagnosis not present

## 2020-12-07 DIAGNOSIS — C50812 Malignant neoplasm of overlapping sites of left female breast: Secondary | ICD-10-CM | POA: Diagnosis not present

## 2020-12-07 DIAGNOSIS — F17210 Nicotine dependence, cigarettes, uncomplicated: Secondary | ICD-10-CM | POA: Diagnosis not present

## 2020-12-07 DIAGNOSIS — Z9012 Acquired absence of left breast and nipple: Secondary | ICD-10-CM | POA: Diagnosis not present

## 2020-12-07 DIAGNOSIS — Z5111 Encounter for antineoplastic chemotherapy: Secondary | ICD-10-CM | POA: Diagnosis not present

## 2020-12-07 DIAGNOSIS — R350 Frequency of micturition: Secondary | ICD-10-CM

## 2020-12-07 DIAGNOSIS — C792 Secondary malignant neoplasm of skin: Secondary | ICD-10-CM | POA: Diagnosis not present

## 2020-12-07 LAB — URINALYSIS, COMPLETE (UACMP) WITH MICROSCOPIC
Bilirubin Urine: NEGATIVE
Glucose, UA: NEGATIVE mg/dL
Ketones, ur: NEGATIVE mg/dL
Nitrite: NEGATIVE
Protein, ur: 30 mg/dL — AB
Specific Gravity, Urine: 1.018 (ref 1.005–1.030)
WBC, UA: >50 WBC/hpf — ABNORMAL HIGH (ref 0–5)
pH: 5 (ref 5.0–8.0)

## 2020-12-07 MED ORDER — NITROFURANTOIN MONOHYD MACRO 100 MG PO CAPS: 100.0000 mg | ORAL_CAPSULE | Freq: Two times a day (BID) | ORAL | 0 refills | Status: DC

## 2020-12-07 NOTE — Telephone Encounter (Signed)
Daughter called asking for results of UA and if we are going to call in antibiotics for patient. Please advise  Contains abnormal dataUrinalysis, Complete w Microscopic Order: [id number]  Status: Final result   Visible to patient: Yes (not seen)   Next appt: 12/23/2020 at 01:00 PM in Oncology ([facility])   Dx: Urinary frequency   0 Result Notes  Component Ref Range & Units 13:02  (12/07/20) 1 yr ago  (06/20/19) 1 yr ago  (04/17/19) 1 yr ago  (03/05/19) 1 yr ago  (02/19/19)  Color, Urine YELLOW YELLOWAbnormal  YELLOW  YELLOWAbnormal  YELLOW  YELLOWAbnormal   APPearance CLEAR CLOUDYAbnormal  CLOUDYAbnormal  CLEARAbnormal  CLEAR  CLEARAbnormal   Specific Gravity, Urine 1.005 - 1.030 1.018  1.017  1.017  1.015  1.018   pH 5.0 - 8.0 5.0  5.0  5.0  6.0  5.0   Glucose, UA NEGATIVE mg/dL NEGATIVE  NEGATIVE  NEGATIVE  NEGATIVE  NEGATIVE   Hgb urine dipstick NEGATIVE SMALLAbnormal  SMALLAbnormal  NEGATIVE  NEGATIVE  NEGATIVE   Bilirubin Urine NEGATIVE NEGATIVE  NEGATIVE  NEGATIVE  NEGATIVE  NEGATIVE   Ketones, ur NEGATIVE mg/dL NEGATIVE  NEGATIVE  NEGATIVE  NEGATIVE  NEGATIVE   Protein, ur NEGATIVE mg/dL 30Abnormal  30Abnormal  NEGATIVE  NEGATIVE  NEGATIVE   Nitrite NEGATIVE NEGATIVE  NEGATIVE  NEGATIVE  NEGATIVE  NEGATIVE   Leukocytes,Ua NEGATIVE LARGEAbnormal  LARGEAbnormal  TRACEAbnormal  MODERATEAbnormal  MODERATEAbnormal   RBC / HPF 0 - 5 RBC/hpf 11-20  0-5  0-5  0-5  0-5   WBC, UA 0 - 5 WBC/hpf >50High  21-50  NONE SEEN  6-10  >50High   Bacteria, UA NONE SEEN RAREAbnormal  MANYAbnormal  RAREAbnormal  FEWAbnormal  NONE SEEN   Squamous Epithelial / LPF 0 - 5 0-5  0-5  0-5  0-5  0-5   Mucus  PRESENT  PRESENT  PRESENT CM  PRESENT CM  PRESENT CM   Comment: Performed at [facility], [address] [place], [place] [address]  Resulting Agency  [facility] CLIN LAB [facility] CL[facility] LAB [facility] CLIN LAB [facility] CL[facility] LAB [facility] LAB         Specimen Collected:  12/07/20 13:02 Last Resulted: 12/07/20 13:39

## 2020-12-07 NOTE — Progress Notes (Addendum)
Spoke to patient regarding symptoms & lab results. UA positive for infection. C&S pending. Hold verzenio. Start macrobid. Follow up with Dr. Rogue Bussing as scheduled.

## 2020-12-09 ENCOUNTER — Telehealth: Payer: Self-pay | Admitting: Nurse Practitioner

## 2020-12-09 LAB — URINE CULTURE: Culture: >=100000 — AB

## 2020-12-09 MED ORDER — NITROFURANTOIN MONOHYD MACRO 100 MG PO CAPS: 100.0000 mg | ORAL_CAPSULE | Freq: Two times a day (BID) | ORAL | 0 refills | Status: AC

## 2020-12-09 NOTE — Telephone Encounter (Signed)
Spoke to patient. Per C&S, E.coli sensitive to macrobid. Patient feels symptoms are slightly improved. Will send 2 additional days of antibiotics for total of 7 days of treatment. Patient thanked for call.

## 2020-12-21 MED FILL — VERZENIO 50 MG TABS: 50 | 28 days supply | Qty: 56 | Fill #3

## 2020-12-23 ENCOUNTER — Telehealth: Payer: Self-pay | Admitting: *Deleted

## 2020-12-23 ENCOUNTER — Encounter: Payer: Self-pay | Admitting: Nurse Practitioner

## 2020-12-23 ENCOUNTER — Inpatient Hospital Stay: Payer: Medicare HMO

## 2020-12-23 ENCOUNTER — Other Ambulatory Visit: Payer: Self-pay

## 2020-12-23 ENCOUNTER — Inpatient Hospital Stay (HOSPITAL_BASED_OUTPATIENT_CLINIC_OR_DEPARTMENT_OTHER): Payer: Medicare HMO | Admitting: Nurse Practitioner

## 2020-12-23 VITALS — BP 143/81 | HR 88 | Temp 97.9°F | Resp 18 | Wt 151.0 lb

## 2020-12-23 DIAGNOSIS — Z79899 Other long term (current) drug therapy: Secondary | ICD-10-CM | POA: Diagnosis not present

## 2020-12-23 DIAGNOSIS — C792 Secondary malignant neoplasm of skin: Secondary | ICD-10-CM | POA: Diagnosis not present

## 2020-12-23 DIAGNOSIS — Z170 Estrogen receptor positive status [ER+]: Secondary | ICD-10-CM

## 2020-12-23 DIAGNOSIS — Z9012 Acquired absence of left breast and nipple: Secondary | ICD-10-CM | POA: Diagnosis not present

## 2020-12-23 DIAGNOSIS — N183 Chronic kidney disease, stage 3 unspecified: Secondary | ICD-10-CM | POA: Diagnosis not present

## 2020-12-23 DIAGNOSIS — C50812 Malignant neoplasm of overlapping sites of left female breast: Secondary | ICD-10-CM

## 2020-12-23 DIAGNOSIS — Z5111 Encounter for antineoplastic chemotherapy: Secondary | ICD-10-CM | POA: Diagnosis not present

## 2020-12-23 DIAGNOSIS — F17210 Nicotine dependence, cigarettes, uncomplicated: Secondary | ICD-10-CM | POA: Diagnosis not present

## 2020-12-23 DIAGNOSIS — R300 Dysuria: Secondary | ICD-10-CM

## 2020-12-23 LAB — CBC WITH DIFFERENTIAL/PLATELET
Abs Immature Granulocytes: 0.01 10*3/uL (ref 0.00–0.07)
Basophils Absolute: 0 10*3/uL (ref 0.0–0.1)
Basophils Relative: 1 %
Eosinophils Absolute: 0.2 10*3/uL (ref 0.0–0.5)
Eosinophils Relative: 3 %
HCT: 36.8 % (ref 36.0–46.0)
Hemoglobin: 12 g/dL (ref 12.0–15.0)
Immature Granulocytes: 0 %
Lymphocytes Relative: 35 %
Lymphs Abs: 2.7 10*3/uL (ref 0.7–4.0)
MCH: 30.5 pg (ref 26.0–34.0)
MCHC: 32.6 g/dL (ref 30.0–36.0)
MCV: 93.4 fL (ref 80.0–100.0)
Monocytes Absolute: 0.9 10*3/uL (ref 0.1–1.0)
Monocytes Relative: 12 %
Neutro Abs: 3.7 10*3/uL (ref 1.7–7.7)
Neutrophils Relative %: 49 %
Platelets: 274 10*3/uL (ref 150–400)
RBC: 3.94 MIL/uL (ref 3.87–5.11)
RDW: 13.2 % (ref 11.5–15.5)
WBC: 7.5 10*3/uL (ref 4.0–10.5)
nRBC: 0 % (ref 0.0–0.2)

## 2020-12-23 LAB — COMPREHENSIVE METABOLIC PANEL
ALT: 12 U/L (ref 0–44)
AST: 23 U/L (ref 15–41)
Albumin: 4.2 g/dL (ref 3.5–5.0)
Alkaline Phosphatase: 51 U/L (ref 38–126)
Anion gap: 14 (ref 5–15)
BUN: 14 mg/dL (ref 8–23)
CO2: 26 mmol/L (ref 22–32)
Calcium: 9.4 mg/dL (ref 8.9–10.3)
Chloride: 100 mmol/L (ref 98–111)
Creatinine, Ser: 1.03 mg/dL — ABNORMAL HIGH (ref 0.44–1.00)
GFR, Estimated: 56 mL/min — ABNORMAL LOW (ref >60)
Glucose, Bld: 135 mg/dL — ABNORMAL HIGH (ref 70–99)
Potassium: 3.5 mmol/L (ref 3.5–5.1)
Sodium: 140 mmol/L (ref 135–145)
Total Bilirubin: 0.7 mg/dL (ref 0.3–1.2)
Total Protein: 8.5 g/dL — ABNORMAL HIGH (ref 6.5–8.1)

## 2020-12-23 MED ORDER — FULVESTRANT 250 MG/5ML IM SOLN
500.0000 mg | Freq: Once | INTRAMUSCULAR | Status: AC
  Administered 2020-12-23: 14:00:00 500 mg via INTRAMUSCULAR
  Filled 2020-12-23: qty 10

## 2020-12-23 NOTE — Telephone Encounter (Signed)
Patient unable to void for u/a prior to leaving cancer center. New apts given for 1/3 for lab only - u/a collection

## 2020-12-23 NOTE — Progress Notes (Signed)
[facility] OFFICE PROGRESS NOTE  Patient Care Team: [name], MD as PCP - General (Internal Medicine) Bary Castilla, Forest Gleason, MD (General Surgery) Marden Noble, MD (Internal Medicine) [name], MD as Consulting Physician (Internal Medicine)  Dr.Charles Mayer Masker; [place]  SUMMARY OF ONCOLOGIC HISTORY:  Oncology History Overview Note  # 2007- LEFT BREAST CA STAGE II [T2N30m; s/p mastec; Dr.Byrnett] ER-Pos/PR Neg; Her- 2 Neu- NEG; ACx4-Taxol x12;Femara; stopped May 2009-stopped follow up.  #JAN 2015- Post mastectomy Chest wall recurrence [chest wall mass bx-]; ER- 90%; PR- NEG; Her2 Neu- NEG; s/p excision [involving skeletal muscle/adipose tissue; clear margins]; CT July 2017- NED;   # Jan 2020- progression/ hilar- START abema [jan 14th]+ faslodex[jan 13th]  # DEC 2021- anemia/? CKD [stool ]  # OSTEOPENIA [BMD- June 2016]  # Kidney cysts  DIAGNOSIS: LEFT BREAST CA  STAGE: IV- NED  ;GOALS: pallaitive  CURRENT/MOST RECENT THERAPY: faslodex+ Abema    Breast cancer metastasized to skin ([facility]  02/05/2014 Initial Diagnosis   [place] skin ([facility]    Malignant neoplasm of overlapping sites of left breast in female, estrogen receptor positive ([place]  07/24/2016 Initial Diagnosis   Cancer of overlapping sites of left female breast ([facility] [place]    01/06/2019 -  Chemotherapy   The patient had fulvestrant (FASLODEX) injection 500 mg, 500 mg, Intramuscular,  Once, 1 of 4 cycles Dose modification: 500 mg (original dose 500 mg, Cycle 1) Administration: 500 mg (08/05/2020)   for chemotherapy treatment.      INTERVAL HISTORY: Patient is very pleasant 79year old African American female with above history of stage IV ER/PR positive breast cancer, currently on Faslodex and Verzenio, currently held due to recent uti, who returns to clinic for re-evaluation and consideration of restarting treatment and Faslodex. She continues to have some  UTI pressure but burning has resolved. Generally feels better. No nausea, vomiting, diarrhea. No headaches, weakness. No bone pain. She is accompanied by family who contributes to history.   Review of Systems  Constitutional: Positive for malaise/fatigue. Negative for chills, diaphoresis, fever and weight loss.  HENT: Negative for nosebleeds and sore throat.   Eyes: Negative for double vision.  Respiratory: Negative for cough, hemoptysis, sputum production, shortness of breath and wheezing.   Cardiovascular: Negative for chest pain, palpitations, orthopnea and leg swelling.  Gastrointestinal: Negative for abdominal pain, blood in stool, constipation, diarrhea, heartburn, melena, nausea and vomiting.  Musculoskeletal: Positive for back pain and joint pain.  Skin: Negative.  Negative for itching and rash.  Neurological: Negative for tingling, focal weakness, weakness and headaches.  Endo/Heme/Allergies: Does not bruise/bleed easily.  Psychiatric/Behavioral: Negative for depression. The patient is not nervous/anxious and does not have insomnia.    PAST MEDICAL HISTORY :  Past Medical History:  Diagnosis Date  . Aneurysm ([facility] 2007  . Arthritis   . Cancer ([facility]   diagnosed 01/07 left breast with simple mastectomu & sn bx. The pt had a 3.5cm tumor. Frozen section report on the 2 sn were negative. On permanent sections a 0.522mmicrometastasis was identified. She was not felt to require a complete axillary dissection. She has completed her Tamoxifen therapy and has been released from routine f/u with medical oncology service.  . Diabetes mellitus without complication ([facility]  . Hyperlipidemia   . Hypertension    since age 79. [facility]   Mastectomy site recurrence resected 01/26/2014,  ER 90%, PR 0%, HER-2/neu nonamplified.  . Other benign neoplasm of connective and other soft tissue of trunk, unspecified 2014  . Personal  history of colonic polyps   . Personal history of tobacco use, presenting hazards to health   . Special screening for malignant neoplasms, colon   . Unspecified disorder of skin and subcutaneous tissue 02/19/2013   biopsy of skin over the sternum on the right breast showed hypertrophic scar,keloid formation.    PAST SURGICAL HISTORY :   Past Surgical History:  Procedure Laterality Date  . ABDOMINAL HYSTERECTOMY  2004   total  . BREAST BIOPSY Right January 26, 2014   Core biopsy for mild increase breast uptake on PET scan, usual ductal hyperplasia and stromal fibrosis  . BREAST SURGERY Left 2007   mastectomy  . chest wall mass  01/26/14  . CHOLECYSTECTOMY  2007  . COLONOSCOPY W/ POLYPECTOMY  2011   Dr. Brynda Greathouse  . EYE SURGERY Right    cataract surgery  . head surgery  1991  . MASTECTOMY Left 2007  . PORT-A-CATH REMOVAL  2008  . PORTACATH PLACEMENT  2007    FAMILY HISTORY :   Family History  Problem Relation Age of Onset  . Cancer Other        ovarian,breast,colon cancers;relations not listed  . Breast cancer Neg Hx     SOCIAL HISTORY:   Social History   Tobacco Use  . Smoking status: Current Some Day Smoker    Packs/day: 1.00    Years: 50.00    Pack years: 50.00    Types: Cigarettes  . Smokeless tobacco: Never Used  Substance Use Topics  . Alcohol use: No    Alcohol/week: 0.0 standard drinks  . Drug use: No   Immunization History  Administered Date(s) Administered  . Fluad Quad(high Dose 65+) 11/14/2019  . Influenza Split 10/17/2013  . Influenza,inj,Quad PF,6+ Mos 09/07/2014, 11/24/2015, 11/29/2016, 09/21/2017  . Influenza-Unspecified 11/24/2015  . Pneumococcal Conjugate-13 05/04/2015  . Pneumococcal Polysaccharide-23 10/17/2013     ALLERGIES:  is allergic to metoprolol.  MEDICATIONS:  Current Outpatient Medications  Medication Sig Dispense Refill  . amLODipine (NORVASC) 10 MG tablet TAKE 1 TABLET BY MOUTH EVERY DAY 90 tablet 1  . atorvastatin (LIPITOR)  20 MG tablet Take 20 mg by mouth daily at 6 PM.     . Calcium Carbonate-Vitamin D 600-200 MG-UNIT CAPS Take 200 capsules by mouth once.    . citalopram (CELEXA) 40 MG tablet Take 1 tablet (40 mg total) by mouth daily. 90 tablet 1  . lisinopril (ZESTRIL) 2.5 MG tablet Take 1 tablet (2.5 mg total) by mouth daily. 90 tablet 1  . meloxicam (MOBIC) 15 MG tablet Take 1 tablet by mouth daily.    . methocarbamol (ROBAXIN) 500 MG tablet Take 1 tablet (500 mg total) by mouth in the morning and at bedtime. 60 tablet 3  . VERZENIO 50 MG tablet TAKE 1 TABLET BY MOUTH TWO TIMES DAILY 56 tablet 3   No current facility-administered medications for this visit.    PHYSICAL EXAMINATION: ECOG PERFORMANCE STATUS: 0 - Asymptomatic  BP (!) 143/81   Pulse 88   Temp 97.9 F (36.6 C) (Tympanic)   Resp 18   Wt 151 lb (68.5 kg)   SpO2 97%   BMI 24.37 kg/m   Filed Weights   12/23/20 1308  Weight: 151 lb (68.5 kg)    Physical Exam Constitutional:      Comments:   She is walking by self.  HENT:     Head: Normocephalic and atraumatic.     Mouth/Throat:     Pharynx: No oropharyngeal exudate.  Eyes:     Pupils: Pupils are equal, round, and reactive to light.  Cardiovascular:     Rate and Rhythm: Normal rate and regular rhythm.  Pulmonary:     Effort: No respiratory distress.     Breath sounds: No wheezing.  Abdominal:     General: Bowel sounds are normal. There is no distension.     Palpations: Abdomen is soft. There is no mass.     Tenderness: There is no abdominal tenderness. There is no guarding or rebound.  Musculoskeletal:        General: No tenderness. Normal range of motion.     Cervical back: Normal range of motion and neck supple.  Skin:    General: Skin is warm.  Neurological:     Mental Status: She is alert and oriented to person, place, and time.  Psychiatric:        Mood and Affect: Affect normal.     LABORATORY DATA:  I have reviewed the data as listed    Component Value  Date/Time   NA 140 12/23/2020 1255   NA 139 05/30/2014 0127   K 3.5 12/23/2020 1255   K 3.7 05/30/2014 0127   CL 100 12/23/2020 1255   CL 106 05/30/2014 0127   CO2 26 12/23/2020 1255   CO2 25 05/30/2014 0127   GLUCOSE 135 (H) 12/23/2020 1255   GLUCOSE 136 (H) 05/30/2014 0127   BUN 14 12/23/2020 1255   BUN 19 (H) 05/30/2014 0127   CREATININE 1.03 (H) 12/23/2020 1255   CREATININE 0.98 05/30/2014 0127   CALCIUM 9.4 12/23/2020 1255   CALCIUM 9.0 05/30/2014 0127   PROT 8.5 (H) 12/23/2020 1255   PROT 7.8 05/08/2014 1131   ALBUMIN 4.2 12/23/2020 1255   ALBUMIN 3.7 05/08/2014 1131   AST 23 12/23/2020 1255   AST 20 05/08/2014 1131   ALT 12 12/23/2020 1255   ALT 18 05/08/2014 1131   ALKPHOS 51 12/23/2020 1255   ALKPHOS 58 05/08/2014 1131   BILITOT 0.7 12/23/2020 1255   BILITOT 0.4 05/08/2014 1131   GFRNONAA 56 (L) 12/23/2020 1255   GFRNONAA 58 (L) 05/30/2014 0127   GFRAA 47 (L) 09/02/2020 0920   GFRAA >60 05/30/2014 0127    No results found for: SPEP, UPEP  Lab Results  Component Value Date   WBC 7.5 12/23/2020   NEUTROABS 3.7 12/23/2020   HGB 12.0 12/23/2020   HCT 36.8 12/23/2020   MCV 93.4 12/23/2020   PLT 274 12/23/2020      Chemistry      Component Value Date/Time   NA 140 12/23/2020 1255   NA 139 05/30/2014 0127   K 3.5 12/23/2020 1255   K 3.7 05/30/2014 0127   CL 100 12/23/2020 1255   CL 106 05/30/2014 0127   CO2 26 12/23/2020 1255   CO2 25 05/30/2014 0127   BUN 14 12/23/2020 1255   BUN 19 (H) 05/30/2014 0127   CREATININE 1.03 (H) 12/23/2020 1255   CREATININE 0.98 05/30/2014 0127      Component Value Date/Time   CALCIUM 9.4 12/23/2020 1255   CALCIUM 9.0 05/30/2014 0127   ALKPHOS 51 12/23/2020 1255   ALKPHOS 58 05/08/2014 1131   AST 23 12/23/2020 1255   AST 20 05/08/2014 1131   ALT 12 12/23/2020 1255   ALT 18 05/08/2014 1131   BILITOT 0.7 12/23/2020 1255  BILITOT 0.4 05/08/2014 1131         RADIOGRAPHIC STUDIES: No imaging today.  No  results found.   ASSESSMENT & PLAN:  1. Left breast ipsilateral chest wall recurrence, stage IV (2015); s/p left mastectomy with left axillary lymph node dissection. Currently on Faslodex plus abema/Verzenio. PET 11/17/20 no distant or recurrent disease. Tolerating treatment well without significant side effects (uti see below). Continue Faslodex today. Restart Verzenio 50 mg twice daily.   2. UTI- abema held d/t recent infection. S/p macrobid. Improved sx, but ongoing urinary pressure. Question infection vs OAB vs others. Unable to give urine sample in clinic. If symptoms persist, will recheck UA.   3. Anemia- mild. Baseline 10-11. Likely secondary to CKD and chronic disease, but hx of positive stool test 1/3 & previously declined GI evaluation. Labs today reviewed and within baseline. Continue to monitor.   4. CKD- stage III. GFR 56. Stable. Continue to monitor.   5. Hip pain- bilateral. S/p ortho evaluation. Arthritis. On meloxicam.   Disposition:  Faslodex today. Restart verzenio. UA if able. RTC in 4 weeks for labs (cbc, cmp, [id number]), MD, Faslodex  No problem-specific Assessment & Plan notes found for this encounter.  Beckey Rutter, DNP, AGNP-C [place] at [facility] [phone] (clinic)

## 2020-12-24 LAB — CANCER ANTIGEN 27.29: CA 27.29: 32.2 U/mL (ref 0.0–38.6)

## 2020-12-27 ENCOUNTER — Other Ambulatory Visit: Payer: Self-pay | Admitting: Nurse Practitioner

## 2020-12-27 ENCOUNTER — Telehealth: Payer: Self-pay | Admitting: Nurse Practitioner

## 2020-12-27 ENCOUNTER — Inpatient Hospital Stay: Payer: Medicare HMO | Attending: Internal Medicine

## 2020-12-27 DIAGNOSIS — Z170 Estrogen receptor positive status [ER+]: Secondary | ICD-10-CM | POA: Diagnosis not present

## 2020-12-27 DIAGNOSIS — Z5111 Encounter for antineoplastic chemotherapy: Secondary | ICD-10-CM | POA: Insufficient documentation

## 2020-12-27 DIAGNOSIS — N390 Urinary tract infection, site not specified: Secondary | ICD-10-CM | POA: Insufficient documentation

## 2020-12-27 DIAGNOSIS — C50812 Malignant neoplasm of overlapping sites of left female breast: Secondary | ICD-10-CM | POA: Diagnosis not present

## 2020-12-27 DIAGNOSIS — Z79899 Other long term (current) drug therapy: Secondary | ICD-10-CM | POA: Diagnosis not present

## 2020-12-27 DIAGNOSIS — N3000 Acute cystitis without hematuria: Secondary | ICD-10-CM

## 2020-12-27 DIAGNOSIS — R300 Dysuria: Secondary | ICD-10-CM

## 2020-12-27 LAB — URINALYSIS, COMPLETE (UACMP) WITH MICROSCOPIC
Bilirubin Urine: NEGATIVE
Glucose, UA: NEGATIVE mg/dL
Ketones, ur: NEGATIVE mg/dL
Nitrite: POSITIVE — AB
Protein, ur: 30 mg/dL — AB
Specific Gravity, Urine: 1.011 (ref 1.005–1.030)
WBC, UA: >50 WBC/hpf — ABNORMAL HIGH (ref 0–5)
pH: 5 (ref 5.0–8.0)

## 2020-12-27 MED ORDER — SULFAMETHOXAZOLE-TRIMETHOPRIM 800-160 MG PO TABS: 1.0000 | ORAL_TABLET | Freq: Two times a day (BID) | ORAL | 0 refills | Status: AC

## 2020-12-27 NOTE — Telephone Encounter (Signed)
Patient unavailable. Spoke to [employer], patient's son. Advised UA consistent with UTI. Recommend holding Verzenio. Start bactrim ds bid x 10 days. Asked that she follow up with me by phone in 10 days to see if symptoms have resolved. Can consider restarting verzenio at that time if resolved. If unresolved, consider extended treatment to 14 days. Consider rechecking UA prior to restarting verzenio.

## 2021-01-20 ENCOUNTER — Inpatient Hospital Stay: Payer: Medicare HMO

## 2021-01-20 ENCOUNTER — Inpatient Hospital Stay (HOSPITAL_BASED_OUTPATIENT_CLINIC_OR_DEPARTMENT_OTHER): Payer: Medicare HMO | Admitting: Internal Medicine

## 2021-01-20 DIAGNOSIS — C50812 Malignant neoplasm of overlapping sites of left female breast: Secondary | ICD-10-CM | POA: Diagnosis not present

## 2021-01-20 DIAGNOSIS — Z170 Estrogen receptor positive status [ER+]: Secondary | ICD-10-CM

## 2021-01-20 DIAGNOSIS — N390 Urinary tract infection, site not specified: Secondary | ICD-10-CM | POA: Diagnosis not present

## 2021-01-20 DIAGNOSIS — E119 Type 2 diabetes mellitus without complications: Secondary | ICD-10-CM | POA: Diagnosis not present

## 2021-01-20 DIAGNOSIS — Z79899 Other long term (current) drug therapy: Secondary | ICD-10-CM | POA: Diagnosis not present

## 2021-01-20 DIAGNOSIS — Z5111 Encounter for antineoplastic chemotherapy: Secondary | ICD-10-CM | POA: Diagnosis not present

## 2021-01-20 LAB — CBC WITH DIFFERENTIAL/PLATELET
Abs Immature Granulocytes: 0.02 10*3/uL (ref 0.00–0.07)
Basophils Absolute: 0 10*3/uL (ref 0.0–0.1)
Basophils Relative: 1 %
Eosinophils Absolute: 0.2 10*3/uL (ref 0.0–0.5)
Eosinophils Relative: 3 %
HCT: 37.5 % (ref 36.0–46.0)
Hemoglobin: 11.9 g/dL — ABNORMAL LOW (ref 12.0–15.0)
Immature Granulocytes: 0 %
Lymphocytes Relative: 36 %
Lymphs Abs: 2.9 10*3/uL (ref 0.7–4.0)
MCH: 29.7 pg (ref 26.0–34.0)
MCHC: 31.7 g/dL (ref 30.0–36.0)
MCV: 93.5 fL (ref 80.0–100.0)
Monocytes Absolute: 0.6 10*3/uL (ref 0.1–1.0)
Monocytes Relative: 8 %
Neutro Abs: 4.2 10*3/uL (ref 1.7–7.7)
Neutrophils Relative %: 52 %
Platelets: 191 10*3/uL (ref 150–400)
RBC: 4.01 MIL/uL (ref 3.87–5.11)
RDW: 13.2 % (ref 11.5–15.5)
WBC: 7.9 10*3/uL (ref 4.0–10.5)
nRBC: 0 % (ref 0.0–0.2)

## 2021-01-20 LAB — COMPREHENSIVE METABOLIC PANEL
ALT: 11 U/L (ref 0–44)
AST: 22 U/L (ref 15–41)
Albumin: 3.9 g/dL (ref 3.5–5.0)
Alkaline Phosphatase: 49 U/L (ref 38–126)
Anion gap: 8 (ref 5–15)
BUN: 18 mg/dL (ref 8–23)
CO2: 27 mmol/L (ref 22–32)
Calcium: 9.2 mg/dL (ref 8.9–10.3)
Chloride: 102 mmol/L (ref 98–111)
Creatinine, Ser: 1.11 mg/dL — ABNORMAL HIGH (ref 0.44–1.00)
GFR, Estimated: 51 mL/min — ABNORMAL LOW (ref >60)
Glucose, Bld: 152 mg/dL — ABNORMAL HIGH (ref 70–99)
Potassium: 4.3 mmol/L (ref 3.5–5.1)
Sodium: 137 mmol/L (ref 135–145)
Total Bilirubin: 0.7 mg/dL (ref 0.3–1.2)
Total Protein: 7.9 g/dL (ref 6.5–8.1)

## 2021-01-20 MED ORDER — FULVESTRANT 250 MG/5ML IM SOLN
500.0000 mg | Freq: Once | INTRAMUSCULAR | Status: AC
  Administered 2021-01-20: 500 mg via INTRAMUSCULAR
  Filled 2021-01-20: qty 10

## 2021-01-20 NOTE — Progress Notes (Signed)
[facility] OFFICE PROGRESS NOTE  Patient Care Team: [name], MD as PCP - General (Internal Medicine) Bary Castilla, Forest Gleason, MD (General Surgery) Marden Noble, MD (Internal Medicine) [name], MD as Consulting Physician (Internal Medicine)  Dr.Charles Mayer Masker; [place]  SUMMARY OF ONCOLOGIC HISTORY:  Oncology History Overview Note  # 2007- LEFT BREAST CA STAGE II [T2N21m; s/p mastec; Dr.Byrnett] ER-Pos/PR Neg; Her- 2 Neu- NEG; ACx4-Taxol x12;Femara; stopped May 2009-stopped follow up.  #JAN 2015- Post mastectomy Chest wall recurrence [chest wall mass bx-]; ER- 90%; PR- NEG; Her2 Neu- NEG; s/p excision [involving skeletal muscle/adipose tissue; clear margins]; CT July 2017- NED;   # Jan 2020- progression/ hilar- START abema [jan 14th]+ faslodex[jan 13th]  # DEC 2021- anemia/? CKD [stool ]  # OSTEOPENIA [BMD- June 2016]  # Kidney cysts  DIAGNOSIS: LEFT BREAST CA  STAGE: IV- NED  ;GOALS: pallaitive  CURRENT/MOST RECENT THERAPY: faslodex+ Abema    Breast cancer metastasized to skin ([facility]  02/05/2014 Initial Diagnosis   Bre[place]ized to skin ([facility]   Malignant neoplasm of overlapping sites of left breast in female, estrogen receptor positive ([facility]  07/24/2016 Initial Diagnosis   Cancer of overlapping sites of left female breast ([place]   01/06/2019 -  Chemotherapy   The patient had fulvestrant (FASLODEX) injection 500 mg, 500 mg, Intramuscular,  Once, 1 of 4 cycles Dose modification: 500 mg (original dose 500 mg, Cycle 1) Administration: 500 mg (08/05/2020)  for chemotherapy treatment.     INTERVAL HISTORY: Accompanied by family  A very pleasant 80year old African-American female patient with above history of ER/PR positive breast cancer stage IV currently on Faslodex + Verzenio is here for follow-up.  Patient denies any nausea vomiting diarrhea.  No tingling or numbness.  No bone pain.  Review of Systems   Constitutional: Positive for malaise/fatigue. Negative for chills, diaphoresis, fever and weight loss.  HENT: Negative for nosebleeds and sore throat.   Eyes: Negative for double vision.  Respiratory: Negative for cough, hemoptysis, sputum production, shortness of breath and wheezing.   Cardiovascular: Negative for chest pain, palpitations, orthopnea and leg swelling.  Gastrointestinal: Negative for abdominal pain, blood in stool, constipation, diarrhea, heartburn, melena, nausea and vomiting.  Musculoskeletal: Positive for back pain and joint pain.  Skin: Negative.  Negative for itching and rash.  Neurological: Negative for tingling, focal weakness, weakness and headaches.  Endo/Heme/Allergies: Does not bruise/bleed easily.  Psychiatric/Behavioral: Negative for depression. The patient is not nervous/anxious and does not have insomnia.    PAST MEDICAL HISTORY :  Past Medical History:  Diagnosis Date  . Aneurysm ([facility] 2007  . Arthritis   . Cancer ([facility]   diagnosed 01/07 left breast with simple mastectomu & sn bx. The pt had a 3.5cm tumor. Frozen section report on the 2 sn were negative. On permanent sections a 0.522mmicrometastasis was identified. She was not felt to require a complete axillary dissection. She has completed her Tamoxifen therapy and has been released from routine f/u with medical oncology service.  . Diabetes mellitus without complication ([facility]  . Hyperlipidemia   . Hypertension    since age 80. [facility]   Mastectomy site recurrence resected 01/26/2014, ER 90%, PR 0%, HER-2/neu nonamplified.  . Other benign neoplasm of connective and other soft tissue of trunk, unspecified 2014  . Personal history of colonic polyps   . Personal history of tobacco use,  presenting hazards to health   . Special screening for malignant neoplasms, colon   . Unspecified disorder of skin and subcutaneous tissue  02/19/2013   biopsy of skin over the sternum on the right breast showed hypertrophic scar,keloid formation.    PAST SURGICAL HISTORY :   Past Surgical History:  Procedure Laterality Date  . ABDOMINAL HYSTERECTOMY  2004   total  . BREAST BIOPSY Right January 26, 2014   Core biopsy for mild increase breast uptake on PET scan, usual ductal hyperplasia and stromal fibrosis  . BREAST SURGERY Left 2007   mastectomy  . chest wall mass  01/26/14  . CHOLECYSTECTOMY  2007  . COLONOSCOPY W/ POLYPECTOMY  2011   Dr. Brynda Greathouse  . EYE SURGERY Right    cataract surgery  . head surgery  1991  . MASTECTOMY Left 2007  . PORT-A-CATH REMOVAL  2008  . PORTACATH PLACEMENT  2007    FAMILY HISTORY :   Family History  Problem Relation Age of Onset  . Cancer Other        ovarian,breast,colon cancers;relations not listed  . Breast cancer Neg Hx     SOCIAL HISTORY:   Social History   Tobacco Use  . Smoking status: Current Some Day Smoker    Packs/day: 1.00    Years: 50.00    Pack years: 50.00    Types: Cigarettes  . Smokeless tobacco: Never Used  Substance Use Topics  . Alcohol use: No    Alcohol/week: 0.0 standard drinks  . Drug use: No    ALLERGIES:  is allergic to metoprolol.  MEDICATIONS:  Current Outpatient Medications  Medication Sig Dispense Refill  . amLODipine (NORVASC) 10 MG tablet TAKE 1 TABLET BY MOUTH EVERY DAY 90 tablet 1  . atorvastatin (LIPITOR) 20 MG tablet Take 20 mg by mouth daily at 6 PM.     . Calcium Carbonate-Vitamin D 600-200 MG-UNIT CAPS Take 200 capsules by mouth once.    . citalopram (CELEXA) 40 MG tablet Take 1 tablet (40 mg total) by mouth daily. 90 tablet 1  . lisinopril (ZESTRIL) 2.5 MG tablet Take 1 tablet (2.5 mg total) by mouth daily. 90 tablet 1  . meloxicam (MOBIC) 15 MG tablet Take 1 tablet by mouth daily.    . methocarbamol (ROBAXIN) 500 MG tablet Take 1 tablet (500 mg total) by mouth in the morning and at bedtime. 60 tablet 3  . VERZENIO 50 MG tablet  TAKE 1 TABLET BY MOUTH TWO TIMES DAILY 56 tablet 3   No current facility-administered medications for this visit.    PHYSICAL EXAMINATION: ECOG PERFORMANCE STATUS: 0 - Asymptomatic  BP (!) 161/71 (BP Location: Right Arm, Patient Position: Sitting)   Pulse 81   Temp 98 F (36.7 C) (Tympanic)   Resp 16   Wt 149 lb 9.6 oz (67.9 kg)   SpO2 98%   BMI 24.15 kg/m   Filed Weights   01/20/21 1017  Weight: 149 lb 9.6 oz (67.9 kg)    Physical Exam Constitutional:      Comments:   She is walking by self.  HENT:     Head: Normocephalic and atraumatic.     Mouth/Throat:     Pharynx: No oropharyngeal exudate.  Eyes:     Pupils: Pupils are equal, round, and reactive to light.  Cardiovascular:     Rate and Rhythm: Normal rate and regular rhythm.  Pulmonary:     Effort: No respiratory distress.     Breath sounds: No  wheezing.  Abdominal:     General: Bowel sounds are normal. There is no distension.     Palpations: Abdomen is soft. There is no mass.     Tenderness: There is no abdominal tenderness. There is no guarding or rebound.  Musculoskeletal:        General: No tenderness. Normal range of motion.     Cervical back: Normal range of motion and neck supple.  Skin:    General: Skin is warm.  Neurological:     Mental Status: She is alert and oriented to person, place, and time.  Psychiatric:        Mood and Affect: Affect normal.    LABORATORY DATA:  I have reviewed the data as listed    Component Value Date/Time   NA 137 01/20/2021 1003   NA 139 05/30/2014 0127   K 4.3 01/20/2021 1003   K 3.7 05/30/2014 0127   CL 102 01/20/2021 1003   CL 106 05/30/2014 0127   CO2 27 01/20/2021 1003   CO2 25 05/30/2014 0127   GLUCOSE 152 (H) 01/20/2021 1003   GLUCOSE 136 (H) 05/30/2014 0127   BUN 18 01/20/2021 1003   BUN 19 (H) 05/30/2014 0127   CREATININE 1.11 (H) 01/20/2021 1003   CREATININE 0.98 05/30/2014 0127   CALCIUM 9.2 01/20/2021 1003   CALCIUM 9.0 05/30/2014 0127   PROT  7.9 01/20/2021 1003   PROT 7.8 05/08/2014 1131   ALBUMIN 3.9 01/20/2021 1003   ALBUMIN 3.7 05/08/2014 1131   AST 22 01/20/2021 1003   AST 20 05/08/2014 1131   ALT 11 01/20/2021 1003   ALT 18 05/08/2014 1131   ALKPHOS 49 01/20/2021 1003   ALKPHOS 58 05/08/2014 1131   BILITOT 0.7 01/20/2021 1003   BILITOT 0.4 05/08/2014 1131   GFRNONAA 51 (L) 01/20/2021 1003   GFRNONAA 58 (L) 05/30/2014 0127   GFRAA 47 (L) 09/02/2020 0920   GFRAA >60 05/30/2014 0127    No results found for: SPEP, UPEP  Lab Results  Component Value Date   WBC 7.9 01/20/2021   NEUTROABS 4.2 01/20/2021   HGB 11.9 (L) 01/20/2021   HCT 37.5 01/20/2021   MCV 93.5 01/20/2021   PLT 191 01/20/2021      Chemistry      Component Value Date/Time   NA 137 01/20/2021 1003   NA 139 05/30/2014 0127   K 4.3 01/20/2021 1003   K 3.7 05/30/2014 0127   CL 102 01/20/2021 1003   CL 106 05/30/2014 0127   CO2 27 01/20/2021 1003   CO2 25 05/30/2014 0127   BUN 18 01/20/2021 1003   BUN 19 (H) 05/30/2014 0127   CREATININE 1.11 (H) 01/20/2021 1003   CREATININE 0.98 05/30/2014 0127      Component Value Date/Time   CALCIUM 9.2 01/20/2021 1003   CALCIUM 9.0 05/30/2014 0127   ALKPHOS 49 01/20/2021 1003   ALKPHOS 58 05/08/2014 1131   AST 22 01/20/2021 1003   AST 20 05/08/2014 1131   ALT 11 01/20/2021 1003   ALT 18 05/08/2014 1131   BILITOT 0.7 01/20/2021 1003   BILITOT 0.4 05/08/2014 1131        RADIOGRAPHIC STUDIES: I have personally reviewed the radiological images as listed and agreed with the findings in the report. No results found.   ASSESSMENT & PLAN:   Malignant neoplasm of overlapping sites of left breast in female, estrogen receptor positive ([place]) #Left breast ipsilateral chest wall recurrence stage IV [2015]; currently on Faslodex plus abema.  NOV 24th, 2021 PET san- Status post left mastectomy with left axillary lymph node dissection; no distant or recurrent disease. STABLE.   # Continue Faslodex today;  continue Verzenio at 50 mg twice daily.  Tolerating well.   # Bilateral hip pain- arthritis- STABLE;   [s/p ortho evaluation]; on meloxicam.  # Mild anemia hemoglobin- 10-11/ CKD-;STABLE; Stool test 1/3 positive. Declines GI evaluation.   # HTN- 140s today- STABLE.  # CKD- stage III- GFR 47- STABLE.   # DISPOSITION: # shot today # follow up in 4 weeks- with MD/labs- cbc/cmp ca- 27-29; Faslodex; Dr.B      [name], MD 01/20/2021 12:53 PM

## 2021-01-20 NOTE — Assessment & Plan Note (Addendum)
#  Left breast ipsilateral chest wall recurrence stage IV [2015]; currently on Faslodex plus abema. NOV 24th, 2021 PET san- Status post left mastectomy with left axillary lymph node dissection; no distant or recurrent disease. STABLE.   # Continue Faslodex today; continue Verzenio at 50 mg twice daily.  Tolerating well.   # Bilateral hip pain- arthritis- STABLE;   [s/p ortho evaluation]; on meloxicam.  # Mild anemia hemoglobin- 10-11/ CKD-;STABLE; Stool test 1/3 positive. Declines GI evaluation.   # HTN- 140s today- STABLE.  # CKD- stage III- GFR 47- STABLE.   # DISPOSITION: # shot today # follow up in 4 weeks- with MD/labs- cbc/cmp ca- 27-29; Faslodex; Dr.B

## 2021-01-21 LAB — CANCER ANTIGEN 27.29: CA 27.29: 50.7 U/mL — ABNORMAL HIGH (ref 0.0–38.6)

## 2021-01-26 MED FILL — VERZENIO 50 MG TABS: 50 | 28 days supply | Qty: 56 | Fill #4

## 2021-02-17 ENCOUNTER — Inpatient Hospital Stay: Payer: Medicare HMO

## 2021-02-17 ENCOUNTER — Inpatient Hospital Stay: Payer: Medicare HMO | Attending: Internal Medicine

## 2021-02-17 ENCOUNTER — Inpatient Hospital Stay (HOSPITAL_BASED_OUTPATIENT_CLINIC_OR_DEPARTMENT_OTHER): Payer: Medicare HMO | Admitting: Internal Medicine

## 2021-02-17 ENCOUNTER — Other Ambulatory Visit: Payer: Self-pay

## 2021-02-17 DIAGNOSIS — C50812 Malignant neoplasm of overlapping sites of left female breast: Secondary | ICD-10-CM | POA: Diagnosis not present

## 2021-02-17 DIAGNOSIS — Z79899 Other long term (current) drug therapy: Secondary | ICD-10-CM | POA: Diagnosis not present

## 2021-02-17 DIAGNOSIS — Z5111 Encounter for antineoplastic chemotherapy: Secondary | ICD-10-CM | POA: Diagnosis not present

## 2021-02-17 DIAGNOSIS — Z170 Estrogen receptor positive status [ER+]: Secondary | ICD-10-CM

## 2021-02-17 DIAGNOSIS — C792 Secondary malignant neoplasm of skin: Secondary | ICD-10-CM | POA: Diagnosis not present

## 2021-02-17 LAB — COMPREHENSIVE METABOLIC PANEL
ALT: 11 U/L (ref 0–44)
AST: 22 U/L (ref 15–41)
Albumin: 4 g/dL (ref 3.5–5.0)
Alkaline Phosphatase: 46 U/L (ref 38–126)
Anion gap: 11 (ref 5–15)
BUN: 20 mg/dL (ref 8–23)
CO2: 26 mmol/L (ref 22–32)
Calcium: 9 mg/dL (ref 8.9–10.3)
Chloride: 101 mmol/L (ref 98–111)
Creatinine, Ser: 1.27 mg/dL — ABNORMAL HIGH (ref 0.44–1.00)
GFR, Estimated: 43 mL/min — ABNORMAL LOW (ref >60)
Glucose, Bld: 165 mg/dL — ABNORMAL HIGH (ref 70–99)
Potassium: 4.2 mmol/L (ref 3.5–5.1)
Sodium: 138 mmol/L (ref 135–145)
Total Bilirubin: 0.7 mg/dL (ref 0.3–1.2)
Total Protein: 7.7 g/dL (ref 6.5–8.1)

## 2021-02-17 LAB — CBC WITH DIFFERENTIAL/PLATELET
Abs Immature Granulocytes: 0.02 10*3/uL (ref 0.00–0.07)
Basophils Absolute: 0 10*3/uL (ref 0.0–0.1)
Basophils Relative: 1 %
Eosinophils Absolute: 0.1 10*3/uL (ref 0.0–0.5)
Eosinophils Relative: 1 %
HCT: 36.6 % (ref 36.0–46.0)
Hemoglobin: 11.4 g/dL — ABNORMAL LOW (ref 12.0–15.0)
Immature Granulocytes: 0 %
Lymphocytes Relative: 34 %
Lymphs Abs: 2 10*3/uL (ref 0.7–4.0)
MCH: 29.2 pg (ref 26.0–34.0)
MCHC: 31.1 g/dL (ref 30.0–36.0)
MCV: 93.8 fL (ref 80.0–100.0)
Monocytes Absolute: 0.5 10*3/uL (ref 0.1–1.0)
Monocytes Relative: 9 %
Neutro Abs: 3.2 10*3/uL (ref 1.7–7.7)
Neutrophils Relative %: 55 %
Platelets: 188 10*3/uL (ref 150–400)
RBC: 3.9 MIL/uL (ref 3.87–5.11)
RDW: 14.3 % (ref 11.5–15.5)
WBC: 5.9 10*3/uL (ref 4.0–10.5)
nRBC: 0 % (ref 0.0–0.2)

## 2021-02-17 MED ORDER — FULVESTRANT 250 MG/5ML IM SOLN
500.0000 mg | Freq: Once | INTRAMUSCULAR | Status: AC
  Administered 2021-02-17: 500 mg via INTRAMUSCULAR
  Filled 2021-02-17: qty 10

## 2021-02-17 NOTE — Progress Notes (Signed)
[place] OFFICE PROGRESS NOTE  Patient Care Team: [name], MD as PCP - General (Internal Medicine) Bary Castilla, Forest Gleason, MD (General Surgery) Marden Noble, MD (Internal Medicine) [name], MD as Consulting Physician (Internal Medicine)  Dr.Charles Mayer Masker; [place]  SUMMARY OF ONCOLOGIC HISTORY:  Oncology History Overview Note  # 2007- LEFT BREAST CA STAGE II [T2N80m; s/p mastec; Dr.Byrnett] ER-Pos/PR Neg; Her- 2 Neu- NEG; ACx4-Taxol x12;Femara; stopped May 2009-stopped follow up.  #JAN 2015- Post mastectomy Chest wall recurrence [chest wall mass bx-]; ER- 90%; PR- NEG; Her2 Neu- NEG; s/p excision [involving skeletal muscle/adipose tissue; clear margins]; CT July 2017- NED;   # Jan 2020- progression/ hilar- START abema [jan 14th]+ faslodex[jan 13th]  # DEC 2021- anemia/? CKD [stool ]  # OSTEOPENIA [BMD- June 2016]  # Kidney cysts  DIAGNOSIS: LEFT BREAST CA  STAGE: IV- NED  ;GOALS: pallaitive  CURRENT/MOST RECENT THERAPY: faslodex+ Abema    Breast cancer metastasized to skin ([facility]  02/05/2014 Initial Diagnosis   Breast cancer metastasized to skin ([facility]   Malignant neoplasm of overlapping sites of left breast in female, estrogen receptor positive ([facility]  07/24/2016 Initial Diagnosis   Cancer of overlapping sites of left female breast ([facility]   01/06/2019 -  Chemotherapy   The patient had fulvestrant (FASLODEX) injection 500 mg, 500 mg, Intramuscular,  Once, 1 of 4 cycles Dose modification: 500 mg (original dose 500 mg, Cycle 1) Administration: 500 mg (08/05/2020)  for chemotherapy treatment.     INTERVAL HISTORY: Accompanied by family  A very pleasant 80year old African-American female patient with above history of ER/PR positive breast cancer stage IV currently on Faslodex + Verzenio is here for follow-up.  Patient denies any diarrhea. Denies any nausea vomiting. No tingling numbness. Pain improved. She continues meloxicam.  No swelling of the legs.  Review of Systems  Constitutional: Positive for malaise/fatigue. Negative for chills, diaphoresis, fever and weight loss.  HENT: Negative for nosebleeds and sore throat.   Eyes: Negative for double vision.  Respiratory: Negative for cough, hemoptysis, sputum production, shortness of breath and wheezing.   Cardiovascular: Negative for chest pain, palpitations, orthopnea and leg swelling.  Gastrointestinal: Negative for abdominal pain, blood in stool, constipation, diarrhea, heartburn, melena, nausea and vomiting.  Musculoskeletal: Positive for back pain and joint pain.  Skin: Negative.  Negative for itching and rash.  Neurological: Negative for tingling, focal weakness, weakness and headaches.  Endo/Heme/Allergies: Does not bruise/bleed easily.  Psychiatric/Behavioral: Negative for depression. The patient is not nervous/anxious and does not have insomnia.    PAST MEDICAL HISTORY :  Past Medical History:  Diagnosis Date  . Aneurysm ([facility] 2007  . Arthritis   . Cancer ([facility] 2007   diagnosed 01/07 left breast with simple mastectomu & sn bx. The pt had a 3.5cm tumor. Frozen section report on the 2 sn were negative. On permanent sections a 0.559mmicrometastasis was identified. She was not felt to require a complete axillary dissection. She has completed her Tamoxifen therapy and has been released from routine f/u with medical oncology service.  . Diabetes mellitus without complication ([facility]  . Hyperlipidemia   . Hypertension    since age 80. [facility]outer [facility]   Mastectomy site recurrence resected 01/26/2014, ER 90%, PR 0%, HER-2/neu nonamplified.  . Other benign neoplasm of connective and other soft tissue of trunk, unspecified 2014  . Personal history of colonic polyps   .  Personal history of tobacco use, presenting hazards to health   . Special screening for malignant neoplasms, colon   . Unspecified  disorder of skin and subcutaneous tissue 02/19/2013   biopsy of skin over the sternum on the right breast showed hypertrophic scar,keloid formation.    PAST SURGICAL HISTORY :   Past Surgical History:  Procedure Laterality Date  . ABDOMINAL HYSTERECTOMY  2004   total  . BREAST BIOPSY Right January 26, 2014   Core biopsy for mild increase breast uptake on PET scan, usual ductal hyperplasia and stromal fibrosis  . BREAST SURGERY Left 2007   mastectomy  . chest wall mass  01/26/14  . CHOLECYSTECTOMY  2007  . COLONOSCOPY W/ POLYPECTOMY  2011   Dr. Brynda Greathouse  . EYE SURGERY Right    cataract surgery  . head surgery  1991  . MASTECTOMY Left 2007  . PORT-A-CATH REMOVAL  2008  . PORTACATH PLACEMENT  2007    FAMILY HISTORY :   Family History  Problem Relation Age of Onset  . Cancer Other        ovarian,breast,colon cancers;relations not listed  . Breast cancer Neg Hx     SOCIAL HISTORY:   Social History   Tobacco Use  . Smoking status: Current Some Day Smoker    Packs/day: 1.00    Years: 50.00    Pack years: 50.00    Types: Cigarettes  . Smokeless tobacco: Never Used  Substance Use Topics  . Alcohol use: No    Alcohol/week: 0.0 standard drinks  . Drug use: No    ALLERGIES:  is allergic to metoprolol.  MEDICATIONS:  Current Outpatient Medications  Medication Sig Dispense Refill  . amLODipine (NORVASC) 10 MG tablet TAKE 1 TABLET BY MOUTH EVERY DAY 90 tablet 1  . atorvastatin (LIPITOR) 20 MG tablet Take 20 mg by mouth daily at 6 PM.     . Calcium Carbonate-Vitamin D 600-200 MG-UNIT CAPS Take 200 capsules by mouth once.    . citalopram (CELEXA) 40 MG tablet Take 1 tablet (40 mg total) by mouth daily. 90 tablet 1  . lisinopril (ZESTRIL) 2.5 MG tablet Take 1 tablet (2.5 mg total) by mouth daily. 90 tablet 1  . meloxicam (MOBIC) 15 MG tablet Take 1 tablet by mouth daily.    . methocarbamol (ROBAXIN) 500 MG tablet Take 1 tablet (500 mg total) by mouth in the morning and at  bedtime. 60 tablet 3  . VERZENIO 50 MG tablet TAKE 1 TABLET BY MOUTH TWO TIMES DAILY 56 tablet 3   No current facility-administered medications for this visit.    PHYSICAL EXAMINATION: ECOG PERFORMANCE STATUS: 0 - Asymptomatic  BP (!) 142/65   Pulse 78   Temp 97.8 F (36.6 C) (Tympanic)   Resp 18   Ht 5' 6"  (1.676 m)   Wt 156 lb (70.8 kg)   BMI 25.18 kg/m   Filed Weights   02/17/21 1003  Weight: 156 lb (70.8 kg)    Physical Exam Constitutional:      Comments:   She is walking by self.  HENT:     Head: Normocephalic and atraumatic.     Mouth/Throat:     Pharynx: No oropharyngeal exudate.  Eyes:     Pupils: Pupils are equal, round, and reactive to light.  Cardiovascular:     Rate and Rhythm: Normal rate and regular rhythm.  Pulmonary:     Effort: No respiratory distress.     Breath sounds: No wheezing.  Abdominal:  General: Bowel sounds are normal. There is no distension.     Palpations: Abdomen is soft. There is no mass.     Tenderness: There is no abdominal tenderness. There is no guarding or rebound.  Musculoskeletal:        General: No tenderness. Normal range of motion.     Cervical back: Normal range of motion and neck supple.  Skin:    General: Skin is warm.  Neurological:     Mental Status: She is alert and oriented to person, place, and time.  Psychiatric:        Mood and Affect: Affect normal.    LABORATORY DATA:  I have reviewed the data as listed    Component Value Date/Time   NA 138 02/17/2021 0945   NA 139 05/30/2014 0127   K 4.2 02/17/2021 0945   K 3.7 05/30/2014 0127   CL 101 02/17/2021 0945   CL 106 05/30/2014 0127   CO2 26 02/17/2021 0945   CO2 25 05/30/2014 0127   GLUCOSE 165 (H) 02/17/2021 0945   GLUCOSE 136 (H) 05/30/2014 0127   BUN 20 02/17/2021 0945   BUN 19 (H) 05/30/2014 0127   CREATININE 1.27 (H) 02/17/2021 0945   CREATININE 0.98 05/30/2014 0127   CALCIUM 9.0 02/17/2021 0945   CALCIUM 9.0 05/30/2014 0127   PROT 7.7  02/17/2021 0945   PROT 7.8 05/08/2014 1131   ALBUMIN 4.0 02/17/2021 0945   ALBUMIN 3.7 05/08/2014 1131   AST 22 02/17/2021 0945   AST 20 05/08/2014 1131   ALT 11 02/17/2021 0945   ALT 18 05/08/2014 1131   ALKPHOS 46 02/17/2021 0945   ALKPHOS 58 05/08/2014 1131   BILITOT 0.7 02/17/2021 0945   BILITOT 0.4 05/08/2014 1131   GFRNONAA 43 (L) 02/17/2021 0945   GFRNONAA 58 (L) 05/30/2014 0127   GFRAA 47 (L) 09/02/2020 0920   GFRAA >60 05/30/2014 0127    No results found for: SPEP, UPEP  Lab Results  Component Value Date   WBC 5.9 02/17/2021   NEUTROABS 3.2 02/17/2021   HGB 11.4 (L) 02/17/2021   HCT 36.6 02/17/2021   MCV 93.8 02/17/2021   PLT 188 02/17/2021      Chemistry      Component Value Date/Time   NA 138 02/17/2021 0945   NA 139 05/30/2014 0127   K 4.2 02/17/2021 0945   K 3.7 05/30/2014 0127   CL 101 02/17/2021 0945   CL 106 05/30/2014 0127   CO2 26 02/17/2021 0945   CO2 25 05/30/2014 0127   BUN 20 02/17/2021 0945   BUN 19 (H) 05/30/2014 0127   CREATININE 1.27 (H) 02/17/2021 0945   CREATININE 0.98 05/30/2014 0127      Component Value Date/Time   CALCIUM 9.0 02/17/2021 0945   CALCIUM 9.0 05/30/2014 0127   ALKPHOS 46 02/17/2021 0945   ALKPHOS 58 05/08/2014 1131   AST 22 02/17/2021 0945   AST 20 05/08/2014 1131   ALT 11 02/17/2021 0945   ALT 18 05/08/2014 1131   BILITOT 0.7 02/17/2021 0945   BILITOT 0.4 05/08/2014 1131        RADIOGRAPHIC STUDIES: I have personally reviewed the radiological images as listed and agreed with the findings in the report. No results found.   ASSESSMENT & PLAN:   Malignant neoplasm of overlapping sites of left breast in female, estrogen receptor positive ([place]) #Left breast ipsilateral chest wall recurrence stage IV [2015]; currently on Faslodex plus abema. NOV 24th, 2021 PET san- Status post  left mastectomy with left axillary lymph node dissection; no distant or recurrent disease. STABLE.   # Continue Faslodex today;  continue Verzenio at 50 mg twice daily.  Tolerating well. TM slightly elevated; monitor for now  # Bilateral hip pain- arthritis- STABLE.   [s/p ortho evaluation]; on meloxicam.  # Mild anemia hemoglobin- 10-11/ CKD-;STABLE. Stool test 1/3 positive. Declines GI evaluation.   # HTN- 140s today- STABLE.   # CKD- stage III- GFR 43- STABLE.   # DISPOSITION: # shot today # follow up in 4 weeks- with MD/labs- cbc/cmp ca- 27-29; Faslodex; Dr.B      [name], MD 02/17/2021 10:51 AM

## 2021-02-17 NOTE — Assessment & Plan Note (Addendum)
#  Left breast ipsilateral chest wall recurrence stage IV [2015]; currently on Faslodex plus abema. NOV 24th, 2021 PET san- Status post left mastectomy with left axillary lymph node dissection; no distant or recurrent disease. STABLE.   # Continue Faslodex today; continue Verzenio at 50 mg twice daily.  Tolerating well. TM slightly elevated; monitor for now  # Bilateral hip pain- arthritis- STABLE.   [s/p ortho evaluation]; on meloxicam.  # Mild anemia hemoglobin- 10-11/ CKD-;STABLE. Stool test 1/3 positive. Declines GI evaluation.   # HTN- 140s today- STABLE.   # CKD- stage III- GFR 43- STABLE.   # DISPOSITION: # shot today # follow up in 4 weeks- with MD/labs- cbc/cmp ca- 27-29; Faslodex; Dr.B

## 2021-02-18 LAB — CANCER ANTIGEN 27.29: CA 27.29: 40.9 U/mL — ABNORMAL HIGH (ref 0.0–38.6)

## 2021-02-21 ENCOUNTER — Other Ambulatory Visit: Payer: Self-pay | Admitting: Internal Medicine

## 2021-03-10 ENCOUNTER — Other Ambulatory Visit: Payer: Self-pay | Admitting: *Deleted

## 2021-03-10 MED ORDER — AMLODIPINE BESYLATE 10 MG PO TABS: 10.0000 mg | ORAL_TABLET | Freq: Every day | ORAL | 1 refills | Status: DC

## 2021-03-17 ENCOUNTER — Inpatient Hospital Stay (HOSPITAL_BASED_OUTPATIENT_CLINIC_OR_DEPARTMENT_OTHER): Payer: Medicare HMO | Admitting: Internal Medicine

## 2021-03-17 ENCOUNTER — Inpatient Hospital Stay: Payer: Medicare HMO | Attending: Internal Medicine

## 2021-03-17 ENCOUNTER — Inpatient Hospital Stay: Payer: Medicare HMO

## 2021-03-17 DIAGNOSIS — C50812 Malignant neoplasm of overlapping sites of left female breast: Secondary | ICD-10-CM | POA: Diagnosis not present

## 2021-03-17 DIAGNOSIS — C792 Secondary malignant neoplasm of skin: Secondary | ICD-10-CM | POA: Insufficient documentation

## 2021-03-17 DIAGNOSIS — Z5111 Encounter for antineoplastic chemotherapy: Secondary | ICD-10-CM | POA: Insufficient documentation

## 2021-03-17 DIAGNOSIS — Z170 Estrogen receptor positive status [ER+]: Secondary | ICD-10-CM

## 2021-03-17 DIAGNOSIS — F17210 Nicotine dependence, cigarettes, uncomplicated: Secondary | ICD-10-CM | POA: Diagnosis not present

## 2021-03-17 LAB — CBC WITH DIFFERENTIAL/PLATELET
Abs Immature Granulocytes: 0.02 10*3/uL (ref 0.00–0.07)
Basophils Absolute: 0 10*3/uL (ref 0.0–0.1)
Basophils Relative: 0 %
Eosinophils Absolute: 0 10*3/uL (ref 0.0–0.5)
Eosinophils Relative: 1 %
HCT: 37.8 % (ref 36.0–46.0)
Hemoglobin: 12 g/dL (ref 12.0–15.0)
Immature Granulocytes: 0 %
Lymphocytes Relative: 25 %
Lymphs Abs: 1.5 10*3/uL (ref 0.7–4.0)
MCH: 29.5 pg (ref 26.0–34.0)
MCHC: 31.7 g/dL (ref 30.0–36.0)
MCV: 92.9 fL (ref 80.0–100.0)
Monocytes Absolute: 0.6 10*3/uL (ref 0.1–1.0)
Monocytes Relative: 9 %
Neutro Abs: 4 10*3/uL (ref 1.7–7.7)
Neutrophils Relative %: 65 %
Platelets: 230 10*3/uL (ref 150–400)
RBC: 4.07 MIL/uL (ref 3.87–5.11)
RDW: 14.7 % (ref 11.5–15.5)
WBC: 6.2 10*3/uL (ref 4.0–10.5)
nRBC: 0 % (ref 0.0–0.2)

## 2021-03-17 LAB — COMPREHENSIVE METABOLIC PANEL
ALT: 12 U/L (ref 0–44)
AST: 24 U/L (ref 15–41)
Albumin: 4.3 g/dL (ref 3.5–5.0)
Alkaline Phosphatase: 42 U/L (ref 38–126)
Anion gap: 11 (ref 5–15)
BUN: 15 mg/dL (ref 8–23)
CO2: 28 mmol/L (ref 22–32)
Calcium: 9.6 mg/dL (ref 8.9–10.3)
Chloride: 99 mmol/L (ref 98–111)
Creatinine, Ser: 1.28 mg/dL — ABNORMAL HIGH (ref 0.44–1.00)
GFR, Estimated: 43 mL/min — ABNORMAL LOW (ref >60)
Glucose, Bld: 136 mg/dL — ABNORMAL HIGH (ref 70–99)
Potassium: 3.9 mmol/L (ref 3.5–5.1)
Sodium: 138 mmol/L (ref 135–145)
Total Bilirubin: 0.9 mg/dL (ref 0.3–1.2)
Total Protein: 8.1 g/dL (ref 6.5–8.1)

## 2021-03-17 MED ORDER — FULVESTRANT 250 MG/5ML IM SOLN
500.0000 mg | Freq: Once | INTRAMUSCULAR | Status: AC
  Administered 2021-03-17: 500 mg via INTRAMUSCULAR
  Filled 2021-03-17: qty 10

## 2021-03-17 NOTE — Assessment & Plan Note (Addendum)
#  Left breast ipsilateral chest wall recurrence stage IV [2015]; currently on Faslodex plus abema. NOV 24th, 2021 PET san- Status post left mastectomy with left axillary lymph node dissection; no distant or recurrent disease. STABLE.   # Continue Faslodex today; continue Verzenio at 50 mg twice daily.  Tolerating well. TM slightly elevated; monitor for now.  Monitor tumor marker closely.  If significant elevated would recommend repeat imaging.  # Bilateral hip pain- arthritis- STABLE;  [s/p ortho evaluation]; on meloxicam.  # Mild anemia hemoglobin- 11-12/ CKD-;STABLE. Stool test 1/3 positive. Declines GI evaluation.   # HTN- 140s today- STABLE  # CKD- stage III- GFR 43- STABLE   # DISPOSITION: # shot today # follow up in 4 weeks- with MD/labs- cbc/cmp ca- 27-29; Faslodex; Dr.B

## 2021-03-17 NOTE — Progress Notes (Signed)
[place] OFFICE PROGRESS NOTE  Patient Care Team: [name], MD as PCP - General (Internal Medicine) Bary Castilla, Forest Gleason, MD (General Surgery) Marden Noble, MD (Internal Medicine) [name], MD as Consulting Physician (Internal Medicine)  Dr.Charles Mayer Masker; [place]  SUMMARY OF ONCOLOGIC HISTORY:  Oncology History Overview Note  # 2007- LEFT BREAST CA STAGE II [T2N80m; s/p mastec; Dr.Byrnett] ER-Pos/PR Neg; Her- 2 Neu- NEG; ACx4-Taxol x12;Femara; stopped May 2009-stopped follow up.  #JAN 2015- Post mastectomy Chest wall recurrence [chest wall mass bx-]; ER- 90%; PR- NEG; Her2 Neu- NEG; s/p excision [involving skeletal muscle/adipose tissue; clear margins]; CT July 2017- NED;   # Jan 2020- progression/ hilar- START abema [jan 14th]+ faslodex[jan 13th]  # DEC 2021- anemia/? CKD [stool ]  # OSTEOPENIA [BMD- June 2016]  # Kidney cysts  DIAGNOSIS: LEFT BREAST CA  STAGE: IV- NED  ;GOALS: pallaitive  CURRENT/MOST RECENT THERAPY: faslodex+ Abema    Breast cancer metastasized to skin ([facility] [place]  02/05/2014 Initial Diagnosis   Breast cancer metastasized to skin ([facility]   Malignant neoplasm of overlapping sites of left breast in female, estrogen receptor positive ([facility]  07/24/2016 Initial Diagnosis   Cancer of overlapping sites of left female breast ([facility]   01/06/2019 -  Chemotherapy   The patient had fulvestrant (FASLODEX) injection 500 mg, 500 mg, Intramuscular,  Once, 1 of 4 cycles Dose modification: 500 mg (original dose 500 mg, Cycle 1) Administration: 500 mg (08/05/2020)  for chemotherapy treatment.     INTERVAL HISTORY: Accompanied by family  A very pleasant 80year old African-American female patient with above history of ER/PR positive breast cancer stage IV currently on Faslodex + Verzenio is here for follow-up.  Patient denies any nausea vomiting. She had abdominal upset-few days ago likely due to diet.  Currently  resolved.  Denies any ongoing abdominal pain.  No worsening pain continues with meloxicam.  Review of Systems  Constitutional: Positive for malaise/fatigue. Negative for chills, diaphoresis, fever and weight loss.  HENT: Negative for nosebleeds and sore throat.   Eyes: Negative for double vision.  Respiratory: Negative for cough, hemoptysis, sputum production, shortness of breath and wheezing.   Cardiovascular: Negative for chest pain, palpitations, orthopnea and leg swelling.  Gastrointestinal: Negative for abdominal pain, blood in stool, constipation, diarrhea, heartburn, melena, nausea and vomiting.  Musculoskeletal: Positive for back pain and joint pain.  Skin: Negative.  Negative for itching and rash.  Neurological: Negative for tingling, focal weakness, weakness and headaches.  Endo/Heme/Allergies: Does not bruise/bleed easily.  Psychiatric/Behavioral: Negative for depression. The patient is not nervous/anxious and does not have insomnia.    PAST MEDICAL HISTORY :  Past Medical History:  Diagnosis Date   Aneurysm ([facility] 2007   Arthritis    Cancer ([facility] 2007   diagnosed 01/07 left breast with simple mastectomu & sn bx. The pt had a 3.5cm tumor. Frozen section report on the 2 sn were negative. On permanent sections a 0.574mmicrometastasis was identified. She was not felt to require a complete axillary dissection. She has completed her Tamoxifen therapy and has been released from routine f/u with medical oncology service.   Diabetes mellitus without complication ([facility]   Hyperlipidemia    Hypertension    since age 89 [facility]   Mastectomy site recurrence resected 01/26/2014, ER 90%, PR 0%, HER-2/neu nonamplified.   Other benign neoplasm of connective and other soft tissue of trunk, unspecified  2014   Personal history of colonic polyps    Personal history of tobacco use, presenting hazards to health     Special screening for malignant neoplasms, colon    Unspecified disorder of skin and subcutaneous tissue 02/19/2013   biopsy of skin over the sternum on the right breast showed hypertrophic scar,keloid formation.    PAST SURGICAL HISTORY :   Past Surgical History:  Procedure Laterality Date   ABDOMINAL HYSTERECTOMY  2004   total   BREAST BIOPSY Right January 26, 2014   Core biopsy for mild increase breast uptake on PET scan, usual ductal hyperplasia and stromal fibrosis   BREAST SURGERY Left 2007   mastectomy   chest wall mass  01/26/14   CHOLECYSTECTOMY  2007   COLONOSCOPY W/ POLYPECTOMY  2011   Dr. Brynda Greathouse   EYE SURGERY Right    cataract surgery   head surgery  1991   MASTECTOMY Left 2007   PORT-A-CATH REMOVAL  2008   PORTACATH PLACEMENT  2007    FAMILY HISTORY :   Family History  Problem Relation Age of Onset   Cancer Other        ovarian,breast,colon cancers;relations not listed   Breast cancer Neg Hx     SOCIAL HISTORY:   Social History   Tobacco Use   Smoking status: Current Some Day Smoker    Packs/day: 1.00    Years: 50.00    Pack years: 50.00    Types: Cigarettes   Smokeless tobacco: Never Used  Substance Use Topics   Alcohol use: No    Alcohol/week: 0.0 standard drinks   Drug use: No    ALLERGIES:  is allergic to metoprolol.  MEDICATIONS:  Current Outpatient Medications  Medication Sig Dispense Refill   amLODipine (NORVASC) 10 MG tablet Take 1 tablet (10 mg total) by mouth daily. 90 tablet 1   atorvastatin (LIPITOR) 20 MG tablet Take 20 mg by mouth daily at 6 PM.      Calcium Carbonate-Vitamin D 600-200 MG-UNIT CAPS Take 200 capsules by mouth once.     citalopram (CELEXA) 40 MG tablet Take 1 tablet (40 mg total) by mouth daily. 90 tablet 1   lisinopril (ZESTRIL) 2.5 MG tablet Take 1 tablet (2.5 mg total) by mouth daily. 90 tablet 1   VERZENIO 50 MG tablet TAKE 1 TABLET BY MOUTH TWO TIMES DAILY 56 tablet 3   meloxicam  (MOBIC) 15 MG tablet Take 1 tablet by mouth daily. (Patient not taking: Reported on 03/17/2021)     methocarbamol (ROBAXIN) 500 MG tablet TAKE 1 TABLET(500 MG) BY MOUTH IN THE MORNING AND AT BEDTIME (Patient not taking: Reported on 03/17/2021) 60 tablet 3   No current facility-administered medications for this visit.    PHYSICAL EXAMINATION: ECOG PERFORMANCE STATUS: 0 - Asymptomatic  BP (!) 142/68 (BP Location: Right Arm, Patient Position: Sitting)    Pulse 79    Temp 98.5 F (36.9 C) (Tympanic)    Resp 18    Ht _0  (1.676 m)    Wt 148 lb 6.4 oz (67.3 kg)    SpO2 98%    BMI 23.95 kg/m   Filed Weights   03/17/21 1007  Weight: 148 lb 6.4 oz (67.3 kg)    Physical Exam Constitutional:      Comments:   She is walking by self.  HENT:     Head: Normocephalic and atraumatic.     Mouth/Throat:     Pharynx: No oropharyngeal exudate.  Eyes:  Pupils: Pupils are equal, round, and reactive to light.  Cardiovascular:     Rate and Rhythm: Normal rate and regular rhythm.  Pulmonary:     Effort: No respiratory distress.     Breath sounds: No wheezing.  Abdominal:     General: Bowel sounds are normal. There is no distension.     Palpations: Abdomen is soft. There is no mass.     Tenderness: There is no abdominal tenderness. There is no guarding or rebound.  Musculoskeletal:        General: No tenderness. Normal range of motion.     Cervical back: Normal range of motion and neck supple.  Skin:    General: Skin is warm.  Neurological:     Mental Status: She is alert and oriented to person, place, and time.  Psychiatric:        Mood and Affect: Affect normal.    LABORATORY DATA:  I have reviewed the data as listed    Component Value Date/Time   NA 138 03/17/2021 0950   NA 139 05/30/2014 0127   K 3.9 03/17/2021 0950   K 3.7 05/30/2014 0127   CL 99 03/17/2021 0950   CL 106 05/30/2014 0127   CO2 28 03/17/2021 0950   CO2 25 05/30/2014 0127   GLUCOSE 136 (H) 03/17/2021 0950    GLUCOSE 136 (H) 05/30/2014 0127   BUN 15 03/17/2021 0950   BUN 19 (H) 05/30/2014 0127   CREATININE 1.28 (H) 03/17/2021 0950   CREATININE 0.98 05/30/2014 0127   CALCIUM 9.6 03/17/2021 0950   CALCIUM 9.0 05/30/2014 0127   PROT 8.1 03/17/2021 0950   PROT 7.8 05/08/2014 1131   ALBUMIN 4.3 03/17/2021 0950   ALBUMIN 3.7 05/08/2014 1131   AST 24 03/17/2021 0950   AST 20 05/08/2014 1131   ALT 12 03/17/2021 0950   ALT 18 05/08/2014 1131   ALKPHOS 42 03/17/2021 0950   ALKPHOS 58 05/08/2014 1131   BILITOT 0.9 03/17/2021 0950   BILITOT 0.4 05/08/2014 1131   GFRNONAA 43 (L) 03/17/2021 0950   GFRNONAA 58 (L) 05/30/2014 0127   GFRAA 47 (L) 09/02/2020 0920   GFRAA >60 05/30/2014 0127    No results found for: SPEP, UPEP  Lab Results  Component Value Date   WBC 6.2 03/17/2021   NEUTROABS 4.0 03/17/2021   HGB 12.0 03/17/2021   HCT 37.8 03/17/2021   MCV 92.9 03/17/2021   PLT 230 03/17/2021      Chemistry      Component Value Date/Time   NA 138 03/17/2021 0950   NA 139 05/30/2014 0127   K 3.9 03/17/2021 0950   K 3.7 05/30/2014 0127   CL 99 03/17/2021 0950   CL 106 05/30/2014 0127   CO2 28 03/17/2021 0950   CO2 25 05/30/2014 0127   BUN 15 03/17/2021 0950   BUN 19 (H) 05/30/2014 0127   CREATININE 1.28 (H) 03/17/2021 0950   CREATININE 0.98 05/30/2014 0127      Component Value Date/Time   CALCIUM 9.6 03/17/2021 0950   CALCIUM 9.0 05/30/2014 0127   ALKPHOS 42 03/17/2021 0950   ALKPHOS 58 05/08/2014 1131   AST 24 03/17/2021 0950   AST 20 05/08/2014 1131   ALT 12 03/17/2021 0950   ALT 18 05/08/2014 1131   BILITOT 0.9 03/17/2021 0950   BILITOT 0.4 05/08/2014 1131        RADIOGRAPHIC STUDIES: I have personally reviewed the radiological images as listed and agreed with the findings in the  report. No results found.   ASSESSMENT & PLAN:   Malignant neoplasm of overlapping sites of left breast in female, estrogen receptor positive ([place]) #Left breast ipsilateral chest wall  recurrence stage IV [2015]; currently on Faslodex plus abema. NOV 24th, 2021 PET san- Status post left mastectomy with left axillary lymph node dissection; no distant or recurrent disease. STABLE.   # Continue Faslodex today; continue Verzenio at 50 mg twice daily.  Tolerating well. TM slightly elevated; monitor for now.  Monitor tumor marker closely.  If significant elevated would recommend repeat imaging.  # Bilateral hip pain- arthritis- STABLE;  [s/p ortho evaluation]; on meloxicam.  # Mild anemia hemoglobin- 11-12/ CKD-;STABLE. Stool test 1/3 positive. Declines GI evaluation.   # HTN- 140s today- STABLE  # CKD- stage III- GFR 43- STABLE   # DISPOSITION: # shot today # follow up in 4 weeks- with MD/labs- cbc/cmp ca- 27-29; Faslodex; Dr.B      [name], MD 03/17/2021 10:50 AM

## 2021-03-18 LAB — CANCER ANTIGEN 27.29: CA 27.29: 48.4 U/mL — ABNORMAL HIGH (ref 0.0–38.6)

## 2021-03-22 ENCOUNTER — Other Ambulatory Visit (HOSPITAL_COMMUNITY): Payer: Self-pay

## 2021-03-26 ENCOUNTER — Other Ambulatory Visit (HOSPITAL_COMMUNITY): Payer: Self-pay

## 2021-03-27 ENCOUNTER — Other Ambulatory Visit (HOSPITAL_COMMUNITY): Payer: Self-pay

## 2021-03-27 MED FILL — Abemaciclib Tab 50 MG: ORAL | 28 days supply | Qty: 56 | Fill #0 | Status: AC

## 2021-03-28 ENCOUNTER — Other Ambulatory Visit (HOSPITAL_COMMUNITY): Payer: Self-pay

## 2021-03-29 ENCOUNTER — Other Ambulatory Visit (HOSPITAL_COMMUNITY): Payer: Self-pay

## 2021-03-31 ENCOUNTER — Other Ambulatory Visit (HOSPITAL_COMMUNITY): Payer: Self-pay

## 2021-04-14 ENCOUNTER — Inpatient Hospital Stay: Payer: Medicare HMO | Attending: Internal Medicine

## 2021-04-14 ENCOUNTER — Encounter: Payer: Self-pay | Admitting: Internal Medicine

## 2021-04-14 ENCOUNTER — Inpatient Hospital Stay (HOSPITAL_BASED_OUTPATIENT_CLINIC_OR_DEPARTMENT_OTHER): Payer: Medicare HMO | Admitting: Internal Medicine

## 2021-04-14 ENCOUNTER — Inpatient Hospital Stay: Payer: Medicare HMO

## 2021-04-14 DIAGNOSIS — C7989 Secondary malignant neoplasm of other specified sites: Secondary | ICD-10-CM | POA: Diagnosis not present

## 2021-04-14 DIAGNOSIS — C50812 Malignant neoplasm of overlapping sites of left female breast: Secondary | ICD-10-CM

## 2021-04-14 DIAGNOSIS — Z5111 Encounter for antineoplastic chemotherapy: Secondary | ICD-10-CM | POA: Diagnosis not present

## 2021-04-14 DIAGNOSIS — Z170 Estrogen receptor positive status [ER+]: Secondary | ICD-10-CM | POA: Insufficient documentation

## 2021-04-14 LAB — COMPREHENSIVE METABOLIC PANEL
ALT: 14 U/L (ref 0–44)
AST: 23 U/L (ref 15–41)
Albumin: 4.1 g/dL (ref 3.5–5.0)
Alkaline Phosphatase: 46 U/L (ref 38–126)
Anion gap: 10 (ref 5–15)
BUN: 23 mg/dL (ref 8–23)
CO2: 25 mmol/L (ref 22–32)
Calcium: 9.3 mg/dL (ref 8.9–10.3)
Chloride: 102 mmol/L (ref 98–111)
Creatinine, Ser: 1.22 mg/dL — ABNORMAL HIGH (ref 0.44–1.00)
GFR, Estimated: 45 mL/min — ABNORMAL LOW (ref >60)
Glucose, Bld: 130 mg/dL — ABNORMAL HIGH (ref 70–99)
Potassium: 4.3 mmol/L (ref 3.5–5.1)
Sodium: 137 mmol/L (ref 135–145)
Total Bilirubin: 0.7 mg/dL (ref 0.3–1.2)
Total Protein: 7.8 g/dL (ref 6.5–8.1)

## 2021-04-14 LAB — CBC WITH DIFFERENTIAL/PLATELET
Abs Immature Granulocytes: 0.01 10*3/uL (ref 0.00–0.07)
Basophils Absolute: 0.1 10*3/uL (ref 0.0–0.1)
Basophils Relative: 1 %
Eosinophils Absolute: 0.2 10*3/uL (ref 0.0–0.5)
Eosinophils Relative: 3 %
HCT: 35.8 % — ABNORMAL LOW (ref 36.0–46.0)
Hemoglobin: 11.5 g/dL — ABNORMAL LOW (ref 12.0–15.0)
Immature Granulocytes: 0 %
Lymphocytes Relative: 44 %
Lymphs Abs: 2.6 10*3/uL (ref 0.7–4.0)
MCH: 29.9 pg (ref 26.0–34.0)
MCHC: 32.1 g/dL (ref 30.0–36.0)
MCV: 93 fL (ref 80.0–100.0)
Monocytes Absolute: 0.4 10*3/uL (ref 0.1–1.0)
Monocytes Relative: 8 %
Neutro Abs: 2.5 10*3/uL (ref 1.7–7.7)
Neutrophils Relative %: 44 %
Platelets: 210 10*3/uL (ref 150–400)
RBC: 3.85 MIL/uL — ABNORMAL LOW (ref 3.87–5.11)
RDW: 14.6 % (ref 11.5–15.5)
WBC: 5.8 10*3/uL (ref 4.0–10.5)
nRBC: 0 % (ref 0.0–0.2)

## 2021-04-14 MED ORDER — FULVESTRANT 250 MG/5ML IM SOLN
500.0000 mg | Freq: Once | INTRAMUSCULAR | Status: AC
  Administered 2021-04-14: 500 mg via INTRAMUSCULAR
  Filled 2021-04-14: qty 10

## 2021-04-14 NOTE — Assessment & Plan Note (Addendum)
#  Left breast ipsilateral chest wall recurrence stage IV [2015]; currently on Faslodex plus abema. NOV 24th, 2021 PET san- Status post left mastectomy with left axillary lymph node dissection; no distant or recurrent disease. STABLE.   # Continue Faslodex today; continue Verzenio at 50 mg twice daily.  Tolerating well. TM slightly elevated; monitor for now.  Monitor tumor marker closely.  Will order imaging at next visit.   # Bilateral hip pain- arthritis- STABLE  [s/p ortho evaluation]; on meloxicam.  # Mild anemia hemoglobin- 11-12/ CKD-;STABLE. Stool test 1/3 positive. Declines GI evaluation.   # HTN- 150s today- STABLE  # CKD- stage III- GFR 43- STABLE   # DISPOSITION: # shot today # follow up in 5/26- with MD/labs- cbc/cmp ca- 27-29; Faslodex; Dr.B

## 2021-04-14 NOTE — Progress Notes (Signed)
[place] OFFICE PROGRESS NOTE  Patient Care Team: [name], MD as PCP - General (Internal Medicine) Bary Castilla, Forest Gleason, MD (General Surgery) Marden Noble, MD (Internal Medicine) [name], MD as Consulting Physician (Internal Medicine)  Dr.Charles Mayer Masker; [place]  SUMMARY OF ONCOLOGIC HISTORY:  Oncology History Overview Note  # 2007- LEFT BREAST CA STAGE II [T2N13m; s/p mastec; Dr.Byrnett] ER-Pos/PR Neg; Her- 2 Neu- NEG; ACx4-Taxol x12;Femara; stopped May 2009-stopped follow up.  #JAN 2015- Post mastectomy Chest wall recurrence [chest wall mass bx-]; ER- 90%; PR- NEG; Her2 Neu- NEG; s/p excision [involving skeletal muscle/adipose tissue; clear margins]; CT July 2017- NED;   # Jan 2020- progression/ hilar- START abema [jan 14th]+ faslodex[jan 13th]  # DEC 2021- anemia/? CKD [stool ]  # OSTEOPENIA [BMD- June 2016]  # Kidney cysts  DIAGNOSIS: LEFT BREAST CA  STAGE: IV- NED  ;GOALS: pallaitive  CURRENT/MOST RECENT THERAPY: faslodex+ Abema    Breast cancer metastasized to skin ([facility]  02/05/2014 Initial Diagnosis   Breast cancer metastasized to skin ([facility]   Malignant neoplasm of overlapping sites of left breast in female, estrogen receptor positive ([facility]  07/24/2016 Initial Diagnosis   Cancer of overlapping sites of left female breast ([facility]   01/06/2019 -  Chemotherapy   The patient had fulvestrant (FASLODEX) injection 500 mg, 500 mg, Intramuscular,  Once, 1 of 4 cycles Dose modification: 500 mg (original dose 500 mg, Cycle 1) Administration: 500 mg (08/05/2020)  for chemotherapy treatment.     INTERVAL HISTORY: Accompanied by family  A very pleasant 80 year old African-American female patient with above history of ER/PR positive breast cancer stage IV currently on Faslodex + Verzenio is here for follow-up.  Patient denies any nausea vomiting.  Denies any diarrhea.  Denies any abdominal pain.  Chronic mild joint pain  currently stable on meloxicam.   Review of Systems  Constitutional: Positive for malaise/fatigue. Negative for chills, diaphoresis, fever and weight loss.  HENT: Negative for nosebleeds and sore throat.   Eyes: Negative for double vision.  Respiratory: Negative for cough, hemoptysis, sputum production, shortness of breath and wheezing.   Cardiovascular: Negative for chest pain, palpitations, orthopnea and leg swelling.  Gastrointestinal: Negative for abdominal pain, blood in stool, constipation, diarrhea, heartburn, melena, nausea and vomiting.  Musculoskeletal: Positive for back pain and joint pain.  Skin: Negative.  Negative for itching and rash.  Neurological: Negative for tingling, focal weakness, weakness and headaches.  Endo/Heme/Allergies: Does not bruise/bleed easily.  Psychiatric/Behavioral: Negative for depression. The patient is not nervous/anxious and does not have insomnia.    PAST MEDICAL HISTORY :  Past Medical History:  Diagnosis Date  . Aneurysm ([facility] 2007  . Arthritis   . Cancer ([facility] 2007   diagnosed 01/07 left breast with simple mastectomu & sn bx. The pt had a 3.5cm tumor. Frozen section report on the 2 sn were negative. On permanent sections a 0.521mmicrometastasis was identified. She was not felt to require a complete axillary dissection. She has completed her Tamoxifen therapy and has been released from routine f/u with medical oncology service.  . Diabetes mellitus without complication ([facility]  . Hyperlipidemia   . Hypertension    since age 89. [facility]   Mastectomy site recurrence resected 01/26/2014, ER 90%, PR 0%, HER-2/neu nonamplified.  . Other benign neoplasm of connective and other soft tissue of trunk, unspecified 2014  . Personal history of colonic  polyps   . Personal history of tobacco use, presenting hazards to health   . Special screening for malignant neoplasms, colon   .  Unspecified disorder of skin and subcutaneous tissue 02/19/2013   biopsy of skin over the sternum on the right breast showed hypertrophic scar,keloid formation.    PAST SURGICAL HISTORY :   Past Surgical History:  Procedure Laterality Date  . ABDOMINAL HYSTERECTOMY  2004   total  . BREAST BIOPSY Right January 26, 2014   Core biopsy for mild increase breast uptake on PET scan, usual ductal hyperplasia and stromal fibrosis  . BREAST SURGERY Left 2007   mastectomy  . chest wall mass  01/26/14  . CHOLECYSTECTOMY  2007  . COLONOSCOPY W/ POLYPECTOMY  2011   Dr. Brynda Greathouse  . EYE SURGERY Right    cataract surgery  . head surgery  1991  . MASTECTOMY Left 2007  . PORT-A-CATH REMOVAL  2008  . PORTACATH PLACEMENT  2007    FAMILY HISTORY :   Family History  Problem Relation Age of Onset  . Cancer Other        ovarian,breast,colon cancers;relations not listed  . Breast cancer Neg Hx     SOCIAL HISTORY:   Social History   Tobacco Use  . Smoking status: Current Some Day Smoker    Packs/day: 1.00    Years: 50.00    Pack years: 50.00    Types: Cigarettes  . Smokeless tobacco: Never Used  Substance Use Topics  . Alcohol use: No    Alcohol/week: 0.0 standard drinks  . Drug use: No    ALLERGIES:  is allergic to metoprolol.  MEDICATIONS:  Current Outpatient Medications  Medication Sig Dispense Refill  . abemaciclib (VERZENIO) 50 MG tablet TAKE 1 TABLET BY MOUTH TWO TIMES DAILY 56 tablet 3  . amLODipine (NORVASC) 10 MG tablet Take 1 tablet (10 mg total) by mouth daily. 90 tablet 1  . atorvastatin (LIPITOR) 20 MG tablet Take 20 mg by mouth daily at 6 PM.     . Calcium Carbonate-Vitamin D 600-200 MG-UNIT CAPS Take 200 capsules by mouth once.    . citalopram (CELEXA) 40 MG tablet Take 1 tablet (40 mg total) by mouth daily. 90 tablet 1  . lisinopril (ZESTRIL) 2.5 MG tablet Take 1 tablet (2.5 mg total) by mouth daily. 90 tablet 1   No current facility-administered medications for this  visit.    PHYSICAL EXAMINATION: ECOG PERFORMANCE STATUS: 0 - Asymptomatic  BP (!) 158/71 (BP Location: Right Arm, Patient Position: Sitting, Cuff Size: Normal)   Pulse 70   Temp 98.1 F (36.7 C) (Tympanic)   Resp 16   Ht 5' 6"  (1.676 m)   Wt 140 lb (63.5 kg)   SpO2 99%   BMI 22.60 kg/m   Filed Weights   04/14/21 1006  Weight: 140 lb (63.5 kg)    Physical Exam Constitutional:      Comments:   She is walking by self.  HENT:     Head: Normocephalic and atraumatic.     Mouth/Throat:     Pharynx: No oropharyngeal exudate.  Eyes:     Pupils: Pupils are equal, round, and reactive to light.  Cardiovascular:     Rate and Rhythm: Normal rate and regular rhythm.  Pulmonary:     Effort: No respiratory distress.     Breath sounds: No wheezing.  Abdominal:     General: Bowel sounds are normal. There is no distension.     Palpations:  Abdomen is soft. There is no mass.     Tenderness: There is no abdominal tenderness. There is no guarding or rebound.  Musculoskeletal:        General: No tenderness. Normal range of motion.     Cervical back: Normal range of motion and neck supple.  Skin:    General: Skin is warm.  Neurological:     Mental Status: She is alert and oriented to person, place, and time.  Psychiatric:        Mood and Affect: Affect normal.    LABORATORY DATA:  I have reviewed the data as listed    Component Value Date/Time   NA 137 04/14/2021 0943   NA 139 05/30/2014 0127   K 4.3 04/14/2021 0943   K 3.7 05/30/2014 0127   CL 102 04/14/2021 0943   CL 106 05/30/2014 0127   CO2 25 04/14/2021 0943   CO2 25 05/30/2014 0127   GLUCOSE 130 (H) 04/14/2021 0943   GLUCOSE 136 (H) 05/30/2014 0127   BUN 23 04/14/2021 0943   BUN 19 (H) 05/30/2014 0127   CREATININE 1.22 (H) 04/14/2021 0943   CREATININE 0.98 05/30/2014 0127   CALCIUM 9.3 04/14/2021 0943   CALCIUM 9.0 05/30/2014 0127   PROT 7.8 04/14/2021 0943   PROT 7.8 05/08/2014 1131   ALBUMIN 4.1 04/14/2021 0943    ALBUMIN 3.7 05/08/2014 1131   AST 23 04/14/2021 0943   AST 20 05/08/2014 1131   ALT 14 04/14/2021 0943   ALT 18 05/08/2014 1131   ALKPHOS 46 04/14/2021 0943   ALKPHOS 58 05/08/2014 1131   BILITOT 0.7 04/14/2021 0943   BILITOT 0.4 05/08/2014 1131   GFRNONAA 45 (L) 04/14/2021 0943   GFRNONAA 58 (L) 05/30/2014 0127   GFRAA 47 (L) 09/02/2020 0920   GFRAA >60 05/30/2014 0127    No results found for: SPEP, UPEP  Lab Results  Component Value Date   WBC 5.8 04/14/2021   NEUTROABS 2.5 04/14/2021   HGB 11.5 (L) 04/14/2021   HCT 35.8 (L) 04/14/2021   MCV 93.0 04/14/2021   PLT 210 04/14/2021      Chemistry      Component Value Date/Time   NA 137 04/14/2021 0943   NA 139 05/30/2014 0127   K 4.3 04/14/2021 0943   K 3.7 05/30/2014 0127   CL 102 04/14/2021 0943   CL 106 05/30/2014 0127   CO2 25 04/14/2021 0943   CO2 25 05/30/2014 0127   BUN 23 04/14/2021 0943   BUN 19 (H) 05/30/2014 0127   CREATININE 1.22 (H) 04/14/2021 0943   CREATININE 0.98 05/30/2014 0127      Component Value Date/Time   CALCIUM 9.3 04/14/2021 0943   CALCIUM 9.0 05/30/2014 0127   ALKPHOS 46 04/14/2021 0943   ALKPHOS 58 05/08/2014 1131   AST 23 04/14/2021 0943   AST 20 05/08/2014 1131   ALT 14 04/14/2021 0943   ALT 18 05/08/2014 1131   BILITOT 0.7 04/14/2021 0943   BILITOT 0.4 05/08/2014 1131        RADIOGRAPHIC STUDIES: I have personally reviewed the radiological images as listed and agreed with the findings in the report. No results found.   ASSESSMENT & PLAN:   Malignant neoplasm of overlapping sites of left breast in female, estrogen receptor positive ([place]) #Left breast ipsilateral chest wall recurrence stage IV [2015]; currently on Faslodex plus abema. NOV 24th, 2021 PET san- Status post left mastectomy with left axillary lymph node dissection; no distant or recurrent disease.  STABLE.   # Continue Faslodex today; continue Verzenio at 50 mg twice daily.  Tolerating well. TM slightly  elevated; monitor for now.  Monitor tumor marker closely.  Will order imaging at next visit.   # Bilateral hip pain- arthritis- STABLE  [s/p ortho evaluation]; on meloxicam.  # Mild anemia hemoglobin- 11-12/ CKD-;STABLE. Stool test 1/3 positive. Declines GI evaluation.   # HTN- 150s today- STABLE  # CKD- stage III- GFR 43- STABLE   # DISPOSITION: # shot today # follow up in 5/26- with MD/labs- cbc/cmp ca- 27-29; Faslodex; Dr.B      [name], MD 04/14/2021 11:30 AM

## 2021-04-15 LAB — CANCER ANTIGEN 27.29: CA 27.29: 56 U/mL — ABNORMAL HIGH (ref 0.0–38.6)

## 2021-04-18 DIAGNOSIS — R6889 Other general symptoms and signs: Secondary | ICD-10-CM | POA: Diagnosis not present

## 2021-04-19 ENCOUNTER — Other Ambulatory Visit (HOSPITAL_COMMUNITY): Payer: Self-pay

## 2021-04-19 MED FILL — Abemaciclib Tab 50 MG: ORAL | 28 days supply | Qty: 56 | Fill #1 | Status: AC

## 2021-04-22 ENCOUNTER — Other Ambulatory Visit (HOSPITAL_COMMUNITY): Payer: Self-pay

## 2021-05-08 IMAGING — CT NUCLEAR MEDICINE PET IMAGE RESTAGING (PS) SKULL BASE TO THIGH
1 of 2 series · 13 of 30 positions shown, 17 images · non-contrast
Comparison: 12/27/2018 PET.  Chest CT 04/15/2019.

CLINICAL DATA: Subsequent treatment strategy for left breast
cancer. Restaging. Last chemotherapy [REDACTED]. History of skin
metastasis. Remote history of left breast cancer. Recurrence in
4603.

EXAM:
NUCLEAR MEDICINE PET SKULL BASE TO THIGH
TECHNIQUE: 8.3 mCi F-18 FDG was injected intravenously. Full-ring PET imaging
was performed from the skull base to thigh after the radiotracer. CT
data was obtained and used for attenuation correction and anatomic
localization.
Fasting blood glucose: 126 mg/dl

[Series 3: ct wb 5.0 b30f · axial · 0.98mm/px · z∈[-846,-201]mm · 13 of 251 slices shown, 17 images]
[im 18/251  brain]
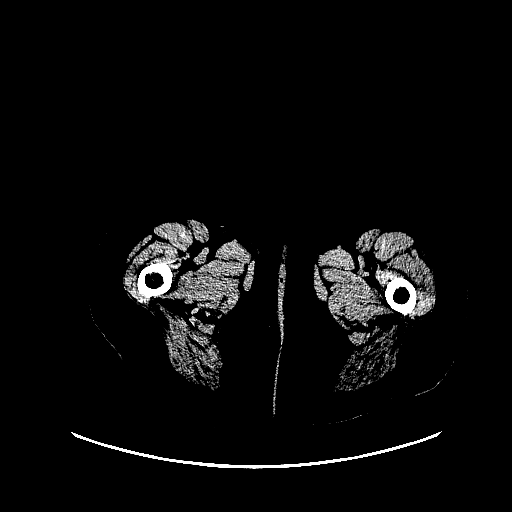
[im 18/251  bone]
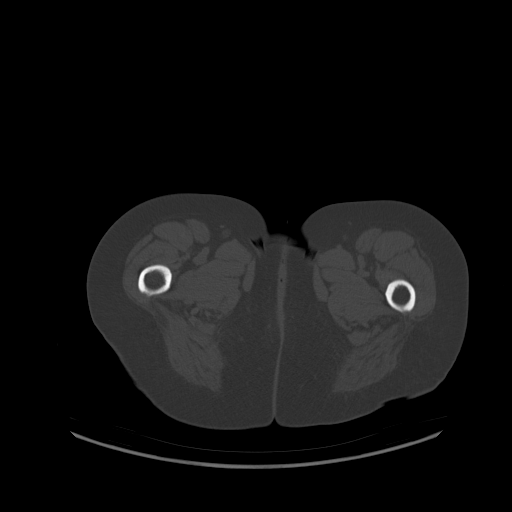
[im 36/251  brain]
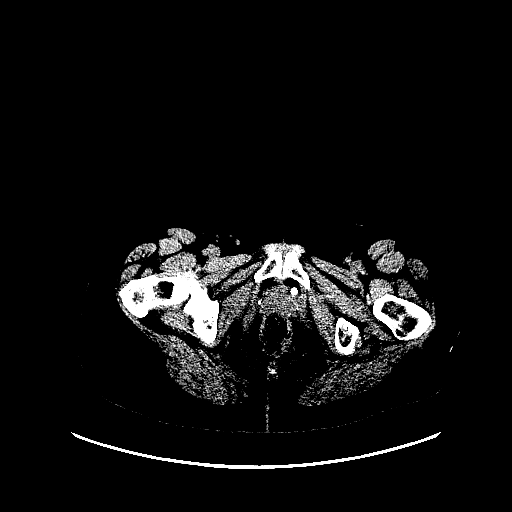
[im 54/251  brain]
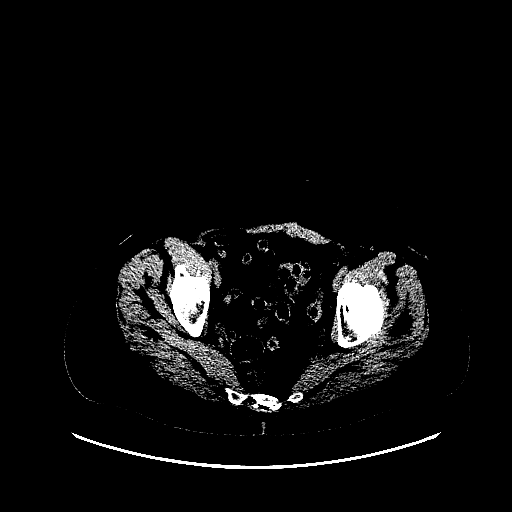
[im 72/251  brain]
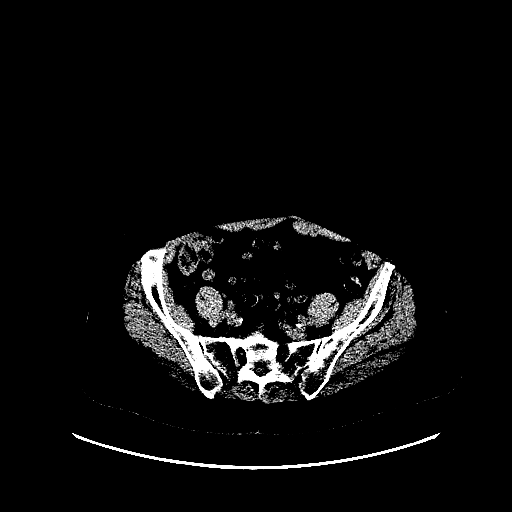
[im 90/251  brain]
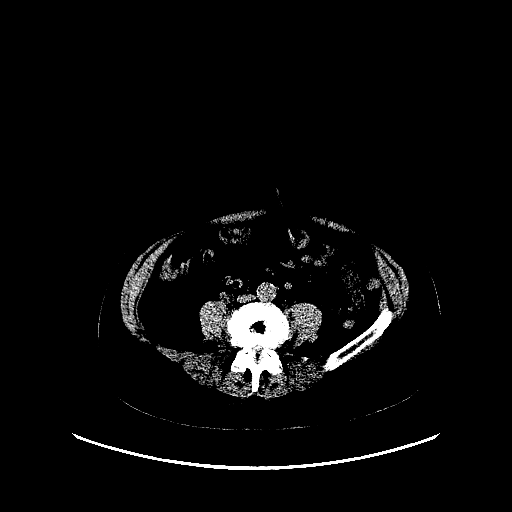
[im 90/251  bone]
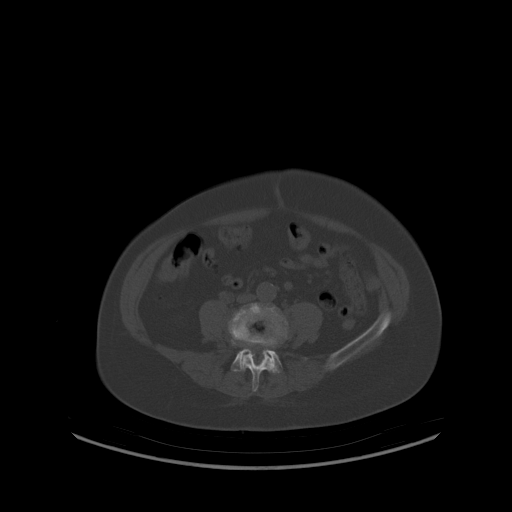
[im 108/251  brain]
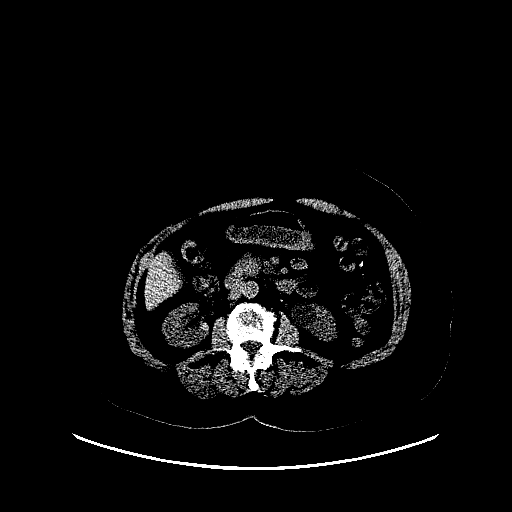
[im 126/251  brain]
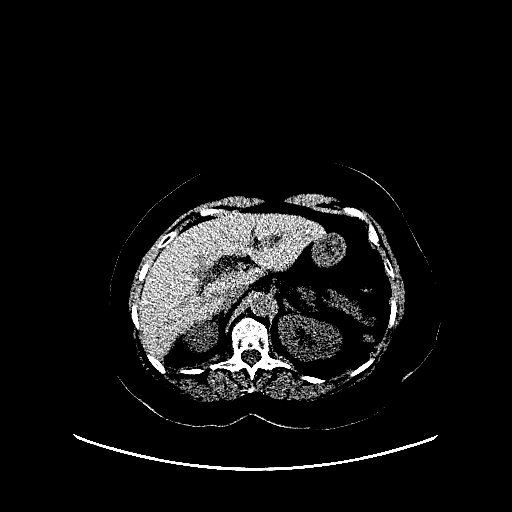
[im 143/251  brain]
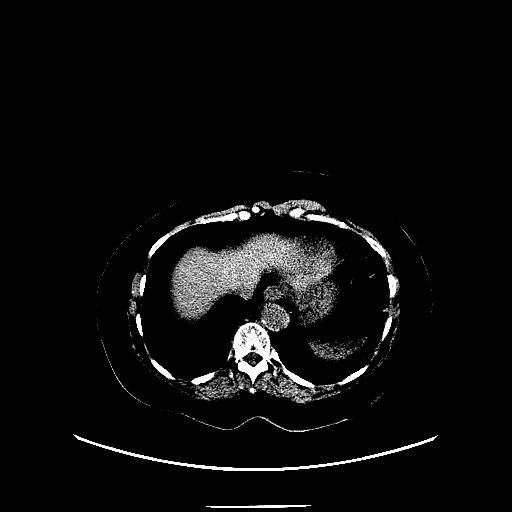
[im 161/251  brain]
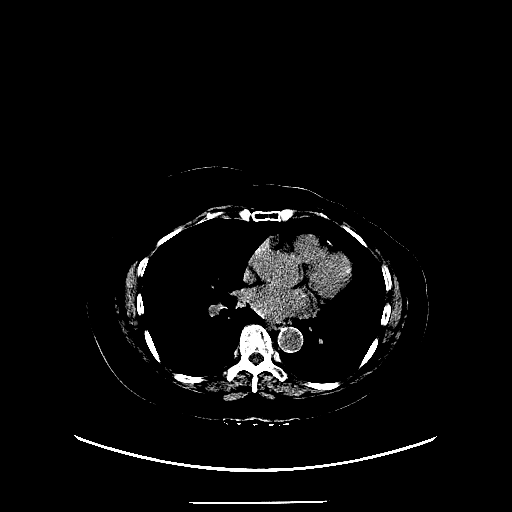
[im 161/251  bone]
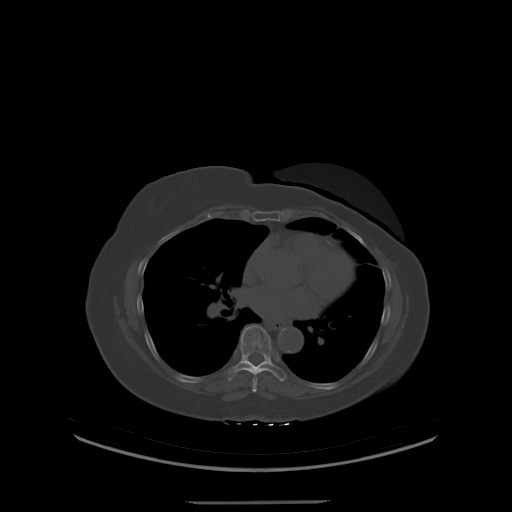
[im 179/251  brain]
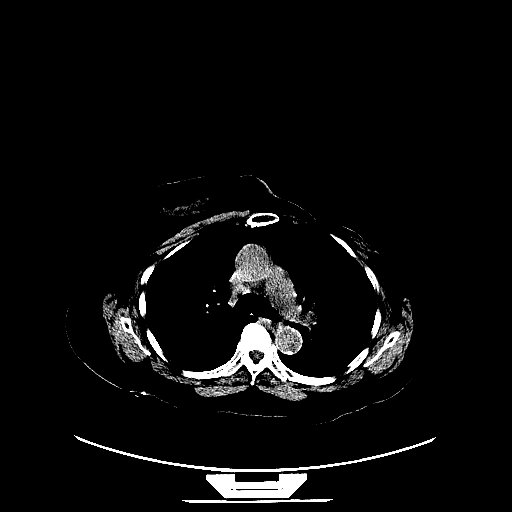
[im 197/251  brain]
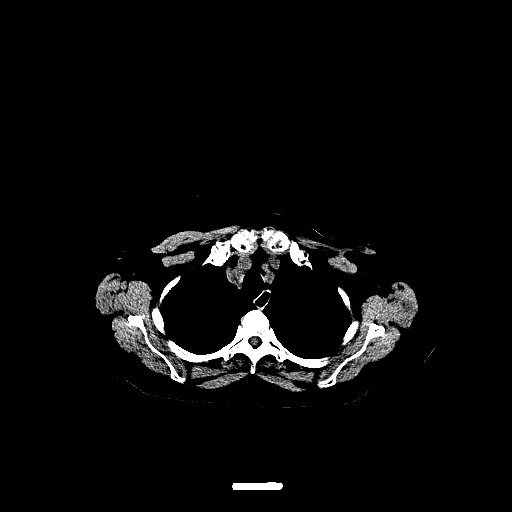
[im 215/251  brain]
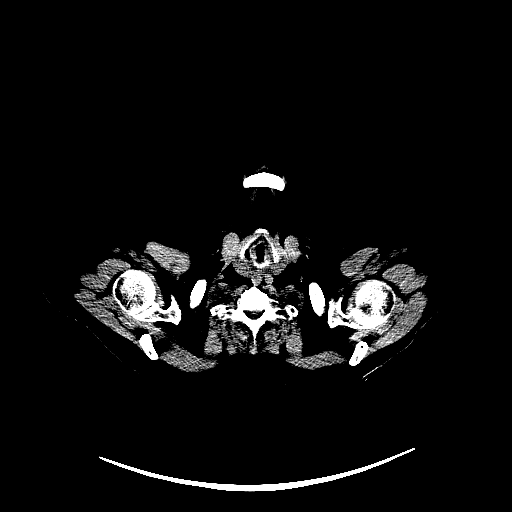
[im 233/251  brain]
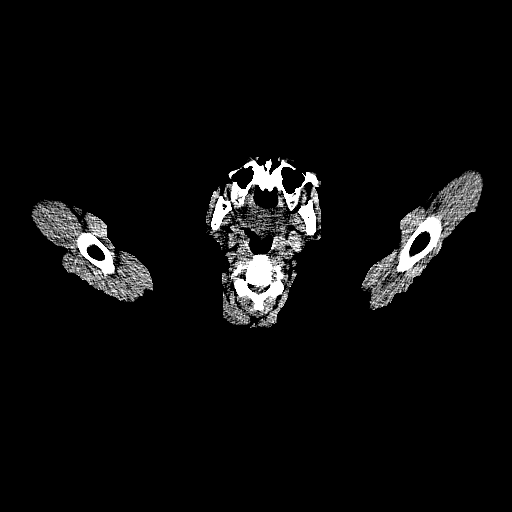
[im 233/251  bone]
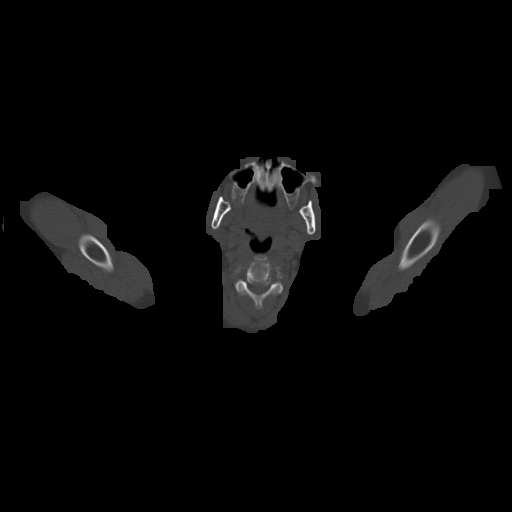

[13 of 30 positions shown; findings below may reference images not displayed]

FINDINGS: Mediastinal blood pool activity: SUV max

Liver activity: SUV max NA

NECK: Mild misregistration between PET and CT images. Given this
limitation, no cervical nodal hypermetabolism.

Incidental CT findings: No cervical adenopathy.

CHEST: subcarinal nodal tissue measures 5 mm and a S.U.V. max of
today versus a S.U.V. max of 6.8 on the prior.

Right infrahilar nodal activity measures a S.U.V. max of 2.6 on
image 93/3 versus a S.U.V. max of 5.4 on the prior.

Nodularity along the central portion of the confluence of the right
middle and right lower lobes measures 1.3 x 1.3 cm and a S.U.V. max
of 2.8 today. Compare 1.5 x 1.2 cm and a S.U.V. max of 6.1 on the
prior.

Incidental CT findings: Left mastectomy. Aortic and coronary artery
atherosclerosis. Emphysema. Scattered pulmonary nodules which are
below PET resolution.

ABDOMEN/PELVIS: No abdominopelvic parenchymal or nodal
hypermetabolism.

Incidental CT findings: Normal adrenal glands. Cholecystectomy with
chronic biliary duct dilatation. Hysterectomy. Pelvic floor laxity.
At least partially duplicated right renal collecting system.

SKELETON: Left iliac wing lytic lesion measures 1.4 cm and a S.U.V.
max of 1.9 today. This was less distinct on prior CT images at a
S.U.V. max of 4.1.

A right iliac lesion measures a S.U.V. max of 2.3 today versus a
S.U.V. max of 4.1 on the prior exam.

Incidental CT findings: Lumbosacral spondylosis. Left greater than
right hip osteoarthritis.
IMPRESSION: 1. Response to therapy of nodal and probable pleuroparenchymal
disease in the right hemithorax. No new soft tissue metastasis.
2. Metabolic response to therapy of bilateral iliac osseous
metastasis.
3. Aortic atherosclerosis (KUA0N-[id number].F), coronary artery
atherosclerosis and emphysema ([id number].O).
4. Left mastectomy.

## 2021-05-12 ENCOUNTER — Other Ambulatory Visit: Payer: Self-pay | Admitting: Internal Medicine

## 2021-05-12 DIAGNOSIS — C50812 Malignant neoplasm of overlapping sites of left female breast: Secondary | ICD-10-CM

## 2021-05-12 DIAGNOSIS — Z170 Estrogen receptor positive status [ER+]: Secondary | ICD-10-CM

## 2021-05-12 DIAGNOSIS — I159 Secondary hypertension, unspecified: Secondary | ICD-10-CM

## 2021-05-19 ENCOUNTER — Inpatient Hospital Stay: Payer: Medicare HMO

## 2021-05-19 ENCOUNTER — Encounter: Payer: Self-pay | Admitting: Internal Medicine

## 2021-05-19 ENCOUNTER — Inpatient Hospital Stay: Payer: Medicare HMO | Attending: Internal Medicine | Admitting: Internal Medicine

## 2021-05-19 ENCOUNTER — Other Ambulatory Visit (HOSPITAL_COMMUNITY): Payer: Self-pay

## 2021-05-19 DIAGNOSIS — C50812 Malignant neoplasm of overlapping sites of left female breast: Secondary | ICD-10-CM

## 2021-05-19 DIAGNOSIS — C792 Secondary malignant neoplasm of skin: Secondary | ICD-10-CM | POA: Insufficient documentation

## 2021-05-19 DIAGNOSIS — F17210 Nicotine dependence, cigarettes, uncomplicated: Secondary | ICD-10-CM | POA: Insufficient documentation

## 2021-05-19 DIAGNOSIS — Z170 Estrogen receptor positive status [ER+]: Secondary | ICD-10-CM | POA: Insufficient documentation

## 2021-05-19 DIAGNOSIS — Z5111 Encounter for antineoplastic chemotherapy: Secondary | ICD-10-CM | POA: Insufficient documentation

## 2021-05-19 LAB — COMPREHENSIVE METABOLIC PANEL
ALT: 12 U/L (ref 0–44)
AST: 22 U/L (ref 15–41)
Albumin: 3.9 g/dL (ref 3.5–5.0)
Alkaline Phosphatase: 59 U/L (ref 38–126)
Anion gap: 11 (ref 5–15)
BUN: 22 mg/dL (ref 8–23)
CO2: 25 mmol/L (ref 22–32)
Calcium: 9 mg/dL (ref 8.9–10.3)
Chloride: 101 mmol/L (ref 98–111)
Creatinine, Ser: 1.18 mg/dL — ABNORMAL HIGH (ref 0.44–1.00)
GFR, Estimated: 47 mL/min — ABNORMAL LOW (ref >60)
Glucose, Bld: 136 mg/dL — ABNORMAL HIGH (ref 70–99)
Potassium: 4.6 mmol/L (ref 3.5–5.1)
Sodium: 137 mmol/L (ref 135–145)
Total Bilirubin: 0.8 mg/dL (ref 0.3–1.2)
Total Protein: 7.4 g/dL (ref 6.5–8.1)

## 2021-05-19 LAB — CBC WITH DIFFERENTIAL/PLATELET
Abs Immature Granulocytes: 0.02 10*3/uL (ref 0.00–0.07)
Basophils Absolute: 0 10*3/uL (ref 0.0–0.1)
Basophils Relative: 1 %
Eosinophils Absolute: 0.2 10*3/uL (ref 0.0–0.5)
Eosinophils Relative: 3 %
HCT: 35.5 % — ABNORMAL LOW (ref 36.0–46.0)
Hemoglobin: 11.3 g/dL — ABNORMAL LOW (ref 12.0–15.0)
Immature Granulocytes: 0 %
Lymphocytes Relative: 31 %
Lymphs Abs: 1.8 10*3/uL (ref 0.7–4.0)
MCH: 30.2 pg (ref 26.0–34.0)
MCHC: 31.8 g/dL (ref 30.0–36.0)
MCV: 94.9 fL (ref 80.0–100.0)
Monocytes Absolute: 0.5 10*3/uL (ref 0.1–1.0)
Monocytes Relative: 8 %
Neutro Abs: 3.3 10*3/uL (ref 1.7–7.7)
Neutrophils Relative %: 57 %
Platelets: 230 10*3/uL (ref 150–400)
RBC: 3.74 MIL/uL — ABNORMAL LOW (ref 3.87–5.11)
RDW: 13.8 % (ref 11.5–15.5)
WBC: 5.7 10*3/uL (ref 4.0–10.5)
nRBC: 0 % (ref 0.0–0.2)

## 2021-05-19 MED ORDER — FULVESTRANT 250 MG/5ML IM SOLN
500.0000 mg | Freq: Once | INTRAMUSCULAR | Status: AC
  Administered 2021-05-19: 500 mg via INTRAMUSCULAR
  Filled 2021-05-19: qty 10

## 2021-05-19 MED FILL — Abemaciclib Tab 50 MG: ORAL | 28 days supply | Qty: 56 | Fill #2 | Status: AC

## 2021-05-19 NOTE — Assessment & Plan Note (Addendum)
#  Left breast ipsilateral chest wall recurrence stage IV [2015]; currently on Faslodex plus abema. NOV 24th, 2021 PET san- Status post left mastectomy with left axillary lymph node dissection; no distant or recurrent disease.STABLE.   # Continue Faslodex today; continue Verzenio at 50 mg twice daily.  Tolerating well. TM slightly elevated; monitor for now.  Monitor tumor marker closely.  Will order imaging today.   # Bilateral hip pain- arthritis- STABLE [s/p ortho evaluation]; on meloxicam.  # Mild anemia hemoglobin- 11-12/ CKD-;STABLE Stool test 1/3 positive. Declines GI evaluation.   # HTN- 150s today- STABLE  # CKD- stage III- GFR 47-STABLE  # DISPOSITION: # shot today # follow up in  4weeks- with MD/labs- cbc/cmp ca- 27-29; Faslodex;CT scan C/A/P-Dr.B

## 2021-05-19 NOTE — Progress Notes (Signed)
[place] OFFICE PROGRESS NOTE  Patient Care Team: [name], MD as PCP - General (Internal Medicine) Bary Castilla, Forest Gleason, MD (General Surgery) Marden Noble, MD (Internal Medicine) [name], MD as Consulting Physician (Internal Medicine)  Dr.Charles Mayer Masker; [place]  SUMMARY OF ONCOLOGIC HISTORY:  Oncology History Overview Note  # 2007- LEFT BREAST CA STAGE II [T2N19m; s/p mastec; Dr.Byrnett] ER-Pos/PR Neg; Her- 2 Neu- NEG; ACx4-Taxol x12;Femara; stopped May 2009-stopped follow up.  #JAN 2015- Post mastectomy Chest wall recurrence [chest wall mass bx-]; ER- 90%; PR- NEG; Her2 Neu- NEG; s/p excision [involving skeletal muscle/adipose tissue; clear margins]; CT July 2017- NED;   # Jan 2020- progression/ hilar- START abema [jan 14th]+ faslodex[jan 13th]  # DEC 2021- anemia/? CKD [stool ]  # OSTEOPENIA [BMD- June 2016]  # Kidney cysts  DIAGNOSIS: LEFT BREAST CA  STAGE: IV- NED  ;GOALS: pallaitive  CURRENT/MOST RECENT THERAPY: faslodex+ Abema    Breast cancer metastasized to skin ([facility]  02/05/2014 Initial Diagnosis   Breast cancer [facility]asized to skin ([facility]   Malignant neoplasm of overlapping sites of left breast in female, estrogen receptor positive ([facility]  07/24/2016 Initial Diagnosis   Cancer of overlapping sites of left female breast ([facility]   01/06/2019 -  Chemotherapy   The patient had fulvestrant (FASLODEX) injection 500 mg, 500 mg, Intramuscular,  Once, 1 of 4 cycles Dose modification: 500 mg (original dose 500 mg, Cycle 1) Administration: 500 mg (08/05/2020)  for chemotherapy treatment.     INTERVAL HISTORY: Accompanied by family  A very pleasant 80year old African-American female patient with above history of ER/PR positive breast cancer stage IV currently on Faslodex + Verzenio is here for follow-up.  Denies any nausea or vomiting.  Denies any worsening diarrhea. denies any abdominal pain.  Joint pains stable on  meloxicam.   Review of Systems  Constitutional: Positive for malaise/fatigue. Negative for chills, diaphoresis, fever and weight loss.  HENT: Negative for nosebleeds and sore throat.   Eyes: Negative for double vision.  Respiratory: Negative for cough, hemoptysis, sputum production, shortness of breath and wheezing.   Cardiovascular: Negative for chest pain, palpitations, orthopnea and leg swelling.  Gastrointestinal: Negative for abdominal pain, blood in stool, constipation, diarrhea, heartburn, melena, nausea and vomiting.  Musculoskeletal: Positive for back pain and joint pain.  Skin: Negative.  Negative for itching and rash.  Neurological: Negative for tingling, focal weakness, weakness and headaches.  Endo/Heme/Allergies: Does not bruise/bleed easily.  Psychiatric/Behavioral: Negative for depression. The patient is not nervous/anxious and does not have insomnia.    PAST MEDICAL HISTORY :  Past Medical History:  Diagnosis Date  . Aneurysm ([facility] 2007  . Arthritis   . Cancer ([facility] 2007   diagnosed 01/07 left breast with simple mastectomu & sn bx. The pt had a 3.5cm tumor. Frozen section report on the 2 sn were negative. On permanent sections a 0.578mmicrometastasis was identified. She was not felt to require a complete axillary dissection. She has completed her Tamoxifen therapy and has been released from routine f/u with medical oncology service.  . Diabetes mellitus without complication ([facility]  . Hyperlipidemia   . Hypertension    since age 80. [facility]   Mastectomy site recurrence resected 01/26/2014, ER 90%, PR 0%, HER-2/neu nonamplified.  . Other benign neoplasm of connective and other soft tissue of trunk, unspecified 2014  . Personal history of colonic polyps   .  Personal history of tobacco use, presenting hazards to health   . Special screening for malignant neoplasms, colon   . Unspecified disorder of  skin and subcutaneous tissue 02/19/2013   biopsy of skin over the sternum on the right breast showed hypertrophic scar,keloid formation.    PAST SURGICAL HISTORY :   Past Surgical History:  Procedure Laterality Date  . ABDOMINAL HYSTERECTOMY  2004   total  . BREAST BIOPSY Right January 26, 2014   Core biopsy for mild increase breast uptake on PET scan, usual ductal hyperplasia and stromal fibrosis  . BREAST SURGERY Left 2007   mastectomy  . chest wall mass  01/26/14  . CHOLECYSTECTOMY  2007  . COLONOSCOPY W/ POLYPECTOMY  2011   Dr. Brynda Greathouse  . EYE SURGERY Right    cataract surgery  . head surgery  1991  . MASTECTOMY Left 2007  . PORT-A-CATH REMOVAL  2008  . PORTACATH PLACEMENT  2007    FAMILY HISTORY :   Family History  Problem Relation Age of Onset  . Cancer Other        ovarian,breast,colon cancers;relations not listed  . Breast cancer Neg Hx     SOCIAL HISTORY:   Social History   Tobacco Use  . Smoking status: Current Some Day Smoker    Packs/day: 1.00    Years: 50.00    Pack years: 50.00    Types: Cigarettes  . Smokeless tobacco: Never Used  Substance Use Topics  . Alcohol use: No    Alcohol/week: 0.0 standard drinks  . Drug use: No    ALLERGIES:  is allergic to metoprolol.  MEDICATIONS:  Current Outpatient Medications  Medication Sig Dispense Refill  . abemaciclib (VERZENIO) 50 MG tablet TAKE 1 TABLET BY MOUTH TWO TIMES DAILY 56 tablet 3  . amLODipine (NORVASC) 10 MG tablet Take 1 tablet (10 mg total) by mouth daily. 90 tablet 1  . atorvastatin (LIPITOR) 20 MG tablet Take 20 mg by mouth daily at 6 PM.     . Calcium Carbonate-Vitamin D 600-200 MG-UNIT CAPS Take 200 capsules by mouth once.    . citalopram (CELEXA) 40 MG tablet Take 1 tablet (40 mg total) by mouth daily. 90 tablet 1  . lisinopril (ZESTRIL) 2.5 MG tablet Take 1 tablet (2.5 mg total) by mouth daily. 90 tablet 1   No current facility-administered medications for this visit.    PHYSICAL  EXAMINATION: ECOG PERFORMANCE STATUS: 0 - Asymptomatic  BP 138/62 (BP Location: Right Arm, Patient Position: Sitting, Cuff Size: Normal)   Pulse 74   Temp 98.2 F (36.8 C) (Tympanic)   Resp 16   Ht 5' 6"  (1.676 m)   Wt 150 lb (68 kg)   SpO2 100%   BMI 24.21 kg/m   Filed Weights   05/19/21 0956  Weight: 150 lb (68 kg)    Physical Exam Constitutional:      Comments:   She is walking by self.  HENT:     Head: Normocephalic and atraumatic.     Mouth/Throat:     Pharynx: No oropharyngeal exudate.  Eyes:     Pupils: Pupils are equal, round, and reactive to light.  Cardiovascular:     Rate and Rhythm: Normal rate and regular rhythm.  Pulmonary:     Effort: No respiratory distress.     Breath sounds: No wheezing.  Abdominal:     General: Bowel sounds are normal. There is no distension.     Palpations: Abdomen is soft. There is  no mass.     Tenderness: There is no abdominal tenderness. There is no guarding or rebound.  Musculoskeletal:        General: No tenderness. Normal range of motion.     Cervical back: Normal range of motion and neck supple.  Skin:    General: Skin is warm.  Neurological:     Mental Status: She is alert and oriented to person, place, and time.  Psychiatric:        Mood and Affect: Affect normal.    LABORATORY DATA:  I have reviewed the data as listed    Component Value Date/Time   NA 137 05/19/2021 0924   NA 139 05/30/2014 0127   K 4.6 05/19/2021 0924   K 3.7 05/30/2014 0127   CL 101 05/19/2021 0924   CL 106 05/30/2014 0127   CO2 25 05/19/2021 0924   CO2 25 05/30/2014 0127   GLUCOSE 136 (H) 05/19/2021 0924   GLUCOSE 136 (H) 05/30/2014 0127   BUN 22 05/19/2021 0924   BUN 19 (H) 05/30/2014 0127   CREATININE 1.18 (H) 05/19/2021 0924   CREATININE 0.98 05/30/2014 0127   CALCIUM 9.0 05/19/2021 0924   CALCIUM 9.0 05/30/2014 0127   PROT 7.4 05/19/2021 0924   PROT 7.8 05/08/2014 1131   ALBUMIN 3.9 05/19/2021 0924   ALBUMIN 3.7 05/08/2014  1131   AST 22 05/19/2021 0924   AST 20 05/08/2014 1131   ALT 12 05/19/2021 0924   ALT 18 05/08/2014 1131   ALKPHOS 59 05/19/2021 0924   ALKPHOS 58 05/08/2014 1131   BILITOT 0.8 05/19/2021 0924   BILITOT 0.4 05/08/2014 1131   GFRNONAA 47 (L) 05/19/2021 0924   GFRNONAA 58 (L) 05/30/2014 0127   GFRAA 47 (L) 09/02/2020 0920   GFRAA >60 05/30/2014 0127    No results found for: SPEP, UPEP  Lab Results  Component Value Date   WBC 5.7 05/19/2021   NEUTROABS 3.3 05/19/2021   HGB 11.3 (L) 05/19/2021   HCT 35.5 (L) 05/19/2021   MCV 94.9 05/19/2021   PLT 230 05/19/2021      Chemistry      Component Value Date/Time   NA 137 05/19/2021 0924   NA 139 05/30/2014 0127   K 4.6 05/19/2021 0924   K 3.7 05/30/2014 0127   CL 101 05/19/2021 0924   CL 106 05/30/2014 0127   CO2 25 05/19/2021 0924   CO2 25 05/30/2014 0127   BUN 22 05/19/2021 0924   BUN 19 (H) 05/30/2014 0127   CREATININE 1.18 (H) 05/19/2021 0924   CREATININE 0.98 05/30/2014 0127      Component Value Date/Time   CALCIUM 9.0 05/19/2021 0924   CALCIUM 9.0 05/30/2014 0127   ALKPHOS 59 05/19/2021 0924   ALKPHOS 58 05/08/2014 1131   AST 22 05/19/2021 0924   AST 20 05/08/2014 1131   ALT 12 05/19/2021 0924   ALT 18 05/08/2014 1131   BILITOT 0.8 05/19/2021 0924   BILITOT 0.4 05/08/2014 1131        RADIOGRAPHIC STUDIES: I have personally reviewed the radiological images as listed and agreed with the findings in the report. No results found.   ASSESSMENT & PLAN:   Malignant neoplasm of overlapping sites of left breast in female, estrogen receptor positive ([place]) #Left breast ipsilateral chest wall recurrence stage IV [2015]; currently on Faslodex plus abema. NOV 24th, 2021 PET san- Status post left mastectomy with left axillary lymph node dissection; no distant or recurrent disease.STABLE.   # Continue Faslodex  today; continue Verzenio at 50 mg twice daily.  Tolerating well. TM slightly elevated; monitor for now.   Monitor tumor marker closely.  Will order imaging today.   # Bilateral hip pain- arthritis- STABLE [s/p ortho evaluation]; on meloxicam.  # Mild anemia hemoglobin- 11-12/ CKD-;STABLE Stool test 1/3 positive. Declines GI evaluation.   # HTN- 150s today- STABLE  # CKD- stage III- GFR 47-STABLE  # DISPOSITION: # shot today # follow up in  4weeks- with MD/labs- cbc/cmp ca- 27-29; Faslodex;CT scan C/A/P-Dr.B      [name], MD 05/19/2021 12:49 PM

## 2021-05-20 LAB — CANCER ANTIGEN 27.29: CA 27.29: 39.2 U/mL — ABNORMAL HIGH (ref 0.0–38.6)

## 2021-05-24 ENCOUNTER — Other Ambulatory Visit (HOSPITAL_COMMUNITY): Payer: Self-pay

## 2021-06-07 ENCOUNTER — Other Ambulatory Visit: Payer: Self-pay

## 2021-06-07 ENCOUNTER — Ambulatory Visit
Admission: RE | Admit: 2021-06-07 | Discharge: 2021-06-07 | Disposition: A | Discharge status: Home / Self Care | Payer: Medicare HMO | Source: Ambulatory Visit | Attending: Internal Medicine | Admitting: Internal Medicine

## 2021-06-07 DIAGNOSIS — I2510 Atherosclerotic heart disease of native coronary artery without angina pectoris: Secondary | ICD-10-CM | POA: Diagnosis not present

## 2021-06-07 DIAGNOSIS — I700 Atherosclerosis of aorta: Secondary | ICD-10-CM | POA: Diagnosis not present

## 2021-06-07 DIAGNOSIS — K5730 Diverticulosis of large intestine without perforation or abscess without bleeding: Secondary | ICD-10-CM | POA: Diagnosis not present

## 2021-06-07 DIAGNOSIS — N281 Cyst of kidney, acquired: Secondary | ICD-10-CM | POA: Diagnosis not present

## 2021-06-07 DIAGNOSIS — J432 Centrilobular emphysema: Secondary | ICD-10-CM | POA: Diagnosis not present

## 2021-06-07 DIAGNOSIS — C50919 Malignant neoplasm of unspecified site of unspecified female breast: Secondary | ICD-10-CM | POA: Diagnosis not present

## 2021-06-07 DIAGNOSIS — C50812 Malignant neoplasm of overlapping sites of left female breast: Secondary | ICD-10-CM

## 2021-06-07 DIAGNOSIS — K6389 Other specified diseases of intestine: Secondary | ICD-10-CM | POA: Diagnosis not present

## 2021-06-07 DIAGNOSIS — Z170 Estrogen receptor positive status [ER+]: Secondary | ICD-10-CM | POA: Diagnosis not present

## 2021-06-07 DIAGNOSIS — N200 Calculus of kidney: Secondary | ICD-10-CM | POA: Diagnosis not present

## 2021-06-14 ENCOUNTER — Other Ambulatory Visit (HOSPITAL_COMMUNITY): Payer: Self-pay

## 2021-06-16 ENCOUNTER — Inpatient Hospital Stay: Payer: Medicare HMO

## 2021-06-16 ENCOUNTER — Inpatient Hospital Stay: Payer: Medicare HMO | Attending: Internal Medicine

## 2021-06-16 ENCOUNTER — Inpatient Hospital Stay (HOSPITAL_BASED_OUTPATIENT_CLINIC_OR_DEPARTMENT_OTHER): Payer: Medicare HMO | Admitting: Internal Medicine

## 2021-06-16 DIAGNOSIS — C50812 Malignant neoplasm of overlapping sites of left female breast: Secondary | ICD-10-CM

## 2021-06-16 DIAGNOSIS — Z170 Estrogen receptor positive status [ER+]: Secondary | ICD-10-CM | POA: Diagnosis not present

## 2021-06-16 DIAGNOSIS — Z5111 Encounter for antineoplastic chemotherapy: Secondary | ICD-10-CM | POA: Insufficient documentation

## 2021-06-16 LAB — CBC WITH DIFFERENTIAL/PLATELET
Abs Immature Granulocytes: 0.01 10*3/uL (ref 0.00–0.07)
Basophils Absolute: 0 10*3/uL (ref 0.0–0.1)
Basophils Relative: 1 %
Eosinophils Absolute: 0.2 10*3/uL (ref 0.0–0.5)
Eosinophils Relative: 4 %
HCT: 34.6 % — ABNORMAL LOW (ref 36.0–46.0)
Hemoglobin: 11.2 g/dL — ABNORMAL LOW (ref 12.0–15.0)
Immature Granulocytes: 0 %
Lymphocytes Relative: 43 %
Lymphs Abs: 2.5 10*3/uL (ref 0.7–4.0)
MCH: 30.5 pg (ref 26.0–34.0)
MCHC: 32.4 g/dL (ref 30.0–36.0)
MCV: 94.3 fL (ref 80.0–100.0)
Monocytes Absolute: 0.6 10*3/uL (ref 0.1–1.0)
Monocytes Relative: 10 %
Neutro Abs: 2.4 10*3/uL (ref 1.7–7.7)
Neutrophils Relative %: 42 %
Platelets: 224 10*3/uL (ref 150–400)
RBC: 3.67 MIL/uL — ABNORMAL LOW (ref 3.87–5.11)
RDW: 13.5 % (ref 11.5–15.5)
WBC: 5.8 10*3/uL (ref 4.0–10.5)
nRBC: 0 % (ref 0.0–0.2)

## 2021-06-16 LAB — COMPREHENSIVE METABOLIC PANEL
ALT: 12 U/L (ref 0–44)
AST: 22 U/L (ref 15–41)
Albumin: 3.9 g/dL (ref 3.5–5.0)
Alkaline Phosphatase: 46 U/L (ref 38–126)
Anion gap: 8 (ref 5–15)
BUN: 18 mg/dL (ref 8–23)
CO2: 27 mmol/L (ref 22–32)
Calcium: 9 mg/dL (ref 8.9–10.3)
Chloride: 101 mmol/L (ref 98–111)
Creatinine, Ser: 1 mg/dL (ref 0.44–1.00)
GFR, Estimated: 58 mL/min — ABNORMAL LOW (ref >60)
Glucose, Bld: 101 mg/dL — ABNORMAL HIGH (ref 70–99)
Potassium: 4.2 mmol/L (ref 3.5–5.1)
Sodium: 136 mmol/L (ref 135–145)
Total Bilirubin: 0.6 mg/dL (ref 0.3–1.2)
Total Protein: 7.8 g/dL (ref 6.5–8.1)

## 2021-06-16 MED ORDER — FULVESTRANT 250 MG/5ML IM SOLN
500.0000 mg | Freq: Once | INTRAMUSCULAR | Status: AC
  Administered 2021-06-16: 500 mg via INTRAMUSCULAR
  Filled 2021-06-16: qty 10

## 2021-06-16 NOTE — Progress Notes (Signed)
[place] OFFICE PROGRESS NOTE  Patient Care Team: [name], MD as PCP - General (Internal Medicine) Bary Castilla, Forest Gleason, MD (General Surgery) Marden Noble, MD (Internal Medicine) [name], MD as Consulting Physician (Internal Medicine)  Dr.Charles Mayer Masker; [place]  SUMMARY OF ONCOLOGIC HISTORY:  Oncology History Overview Note  # 2007- LEFT BREAST CA STAGE II [T2N62m; s/p mastec; Dr.Byrnett] ER-Pos/PR Neg; Her- 2 Neu- NEG; ACx4-Taxol x12;Femara; stopped May 2009-stopped follow up.  #JAN 2015- Post mastectomy Chest wall recurrence [chest wall mass bx-]; ER- 90%; PR- NEG; Her2 Neu- NEG; s/p excision [involving skeletal muscle/adipose tissue; clear margins]; CT July 2017- NED;   # Jan 2020- progression/ hilar- START abema [jan 14th]+ faslodex[jan 13th]  # DEC 2021- anemia/? CKD [stool ]  # OSTEOPENIA [BMD- June 2016]  # Kidney cysts  DIAGNOSIS: LEFT BREAST CA  STAGE: IV- NED  ;GOALS: pallaitive  CURRENT/MOST RECENT THERAPY: faslodex+ Abema    Breast cancer metastasized to skin ([facility]  02/05/2014 Initial Diagnosis   Breast cancer metastasized to skin ([facility]    Malignant neoplasm of overlapping sites of left breast in female, estrogen receptor positive ([place]  07/24/2016 Initial Diagnosis   [place] overlapping [place] of left female breast ([place]    01/06/2019 -  Chemotherapy   The patient had fulvestrant (FASLODEX) injection 500 mg, 500 mg, Intramuscular,  Once, 1 of 4 cycles Dose modification: 500 mg (original dose 500 mg, Cycle 1) Administration: 500 mg (08/05/2020)   for chemotherapy treatment.      INTERVAL HISTORY: Accompanied by family  A very pleasant 80year old African-American female patient with above history of ER/PR positive breast cancer stage IV currently on Faslodex + Verzenio is here for follow-up/review results of the CT scan.  Patient denies any diarrhea.  Denies abdominal pain.  Joint pain stable on  meloxicam.  No headaches.  No chest pain.  No shortness of breath  Review of Systems  Constitutional:  Positive for malaise/fatigue. Negative for chills, diaphoresis, fever and weight loss.  HENT:  Negative for nosebleeds and sore throat.   Eyes:  Negative for double vision.  Respiratory:  Negative for cough, hemoptysis, sputum production, shortness of breath and wheezing.   Cardiovascular:  Negative for chest pain, palpitations, orthopnea and leg swelling.  Gastrointestinal:  Negative for abdominal pain, blood in stool, constipation, diarrhea, heartburn, melena, nausea and vomiting.  Musculoskeletal:  Positive for back pain and joint pain.  Skin: Negative.  Negative for itching and rash.  Neurological:  Negative for tingling, focal weakness, weakness and headaches.  Endo/Heme/Allergies:  Does not bruise/bleed easily.  Psychiatric/Behavioral:  Negative for depression. The patient is not nervous/anxious and does not have insomnia.   PAST MEDICAL HISTORY :  Past Medical History:  Diagnosis Date  . [place]neurysm ([facility] [place]  . Arthritis   . Cancer ([facility]   diagnosed 01/07 left breast with simple mastectomu & sn bx. The pt had a 3.5cm tumor. Frozen section report on the 2 sn were negative. On permanent sections a 0.548mmicrometastasis was identified. She was not felt to require a complete axillary dissection. She has completed her Tamoxifen therapy and has been released from routine f/u with medical oncology service.  . Diabetes mellitus without complication ([facility]  . Hyperlipidemia   . Hypertension    since age 80. [facility]   Mastectomy site recurrence resected 01/26/2014, ER 90%, PR 0%, HER-2/neu nonamplified.  .[place]  Other benign neoplasm of connective and other soft tissue of trunk, unspecified 2014  . Personal history of colonic polyps   . Personal history of tobacco use, presenting hazards to health   . Special screening  for malignant neoplasms, colon   . Unspecified disorder of skin and subcutaneous tissue 02/19/2013   biopsy of skin over the sternum on the right breast showed hypertrophic scar,keloid formation.    PAST SURGICAL HISTORY :   Past Surgical History:  Procedure Laterality Date  . ABDOMINAL HYSTERECTOMY  2004   total  . BREAST BIOPSY Right January 26, 2014   Core biopsy for mild increase breast uptake on PET scan, usual ductal hyperplasia and stromal fibrosis  . BREAST SURGERY Left 2007   mastectomy  . chest wall mass  01/26/14  . CHOLECYSTECTOMY  2007  . COLONOSCOPY W/ POLYPECTOMY  2011   Dr. Brynda Greathouse  . EYE SURGERY Right    cataract surgery  . head surgery  1991  . MASTECTOMY Left 2007  . PORT-A-CATH REMOVAL  2008  . PORTACATH PLACEMENT  2007    FAMILY HISTORY :   Family History  Problem Relation Age of Onset  . Cancer Other        ovarian,breast,colon cancers;relations not listed  . Breast cancer Neg Hx     SOCIAL HISTORY:   Social History   Tobacco Use  . Smoking status: Some Days    Packs/day: 1.00    Years: 50.00    Pack years: 50.00    Types: Cigarettes  . Smokeless tobacco: Never  Substance Use Topics  . Alcohol use: No    Alcohol/week: 0.0 standard drinks  . Drug use: No    ALLERGIES:  is allergic to metoprolol.  MEDICATIONS:  Current Outpatient Medications  Medication Sig Dispense Refill  . abemaciclib (VERZENIO) 50 MG tablet TAKE 1 TABLET BY MOUTH TWO TIMES DAILY 56 tablet 3  . amLODipine (NORVASC) 10 MG tablet Take 1 tablet (10 mg total) by mouth daily. 90 tablet 1  . atorvastatin (LIPITOR) 20 MG tablet Take 20 mg by mouth daily at 6 PM.     . Calcium Carbonate-Vitamin D 600-200 MG-UNIT CAPS Take 200 capsules by mouth once.    . citalopram (CELEXA) 40 MG tablet Take 1 tablet (40 mg total) by mouth daily. 90 tablet 1  . lisinopril (ZESTRIL) 2.5 MG tablet Take 1 tablet (2.5 mg total) by mouth daily. 90 tablet 1  . meloxicam (MOBIC) 15 MG tablet      No  current facility-administered medications for this visit.   Facility-Administered Medications Ordered in Other Visits  Medication Dose Route Frequency Provider Last Rate Last Admin  . fulvestrant (FASLODEX) injection 500 mg  500 mg Intramuscular Once [name], MD        PHYSICAL EXAMINATION: ECOG PERFORMANCE STATUS: 0 - Asymptomatic  BP 129/69   Pulse 66   Temp 98.4 F (36.9 C) (Oral)   Resp 17   Wt 151 lb 4 oz (68.6 kg)   SpO2 100%   BMI 24.41 kg/m   Filed Weights   06/16/21 1054  Weight: 151 lb 4 oz (68.6 kg)    Physical Exam Constitutional:      Comments:   She is walking by self.  HENT:     Head: Normocephalic and atraumatic.     Mouth/Throat:     Pharynx: No oropharyngeal exudate.  Eyes:     Pupils: Pupils are equal, round, and reactive to light.  Cardiovascular:  Rate and Rhythm: Normal rate and regular rhythm.  Pulmonary:     Effort: No respiratory distress.     Breath sounds: No wheezing.  Abdominal:     General: Bowel sounds are normal. There is no distension.     Palpations: Abdomen is soft. There is no mass.     Tenderness: There is no abdominal tenderness. There is no guarding or rebound.  Musculoskeletal:        General: No tenderness. Normal range of motion.     Cervical back: Normal range of motion and neck supple.  Skin:    General: Skin is warm.  Neurological:     Mental Status: She is alert and oriented to person, place, and time.  Psychiatric:        Mood and Affect: Affect normal.   LABORATORY DATA:  I have reviewed the data as listed    Component Value Date/Time   NA 136 06/16/2021 0950   NA 139 05/30/2014 0127   K 4.2 06/16/2021 0950   K 3.7 05/30/2014 0127   CL 101 06/16/2021 0950   CL 106 05/30/2014 0127   CO2 27 06/16/2021 0950   CO2 25 05/30/2014 0127   GLUCOSE 101 (H) 06/16/2021 0950   GLUCOSE 136 (H) 05/30/2014 0127   BUN 18 06/16/2021 0950   BUN 19 (H) 05/30/2014 0127   CREATININE 1.00 06/16/2021 0950    CREATININE 0.98 05/30/2014 0127   CALCIUM 9.0 06/16/2021 0950   CALCIUM 9.0 05/30/2014 0127   PROT 7.8 06/16/2021 0950   PROT 7.8 05/08/2014 1131   ALBUMIN 3.9 06/16/2021 0950   ALBUMIN 3.7 05/08/2014 1131   AST 22 06/16/2021 0950   AST 20 05/08/2014 1131   ALT 12 06/16/2021 0950   ALT 18 05/08/2014 1131   ALKPHOS 46 06/16/2021 0950   ALKPHOS 58 05/08/2014 1131   BILITOT 0.6 06/16/2021 0950   BILITOT 0.4 05/08/2014 1131   GFRNONAA 58 (L) 06/16/2021 0950   GFRNONAA 58 (L) 05/30/2014 0127   GFRAA 47 (L) 09/02/2020 0920   GFRAA >60 05/30/2014 0127    No results found for: SPEP, UPEP  Lab Results  Component Value Date   WBC 5.8 06/16/2021   NEUTROABS 2.4 06/16/2021   HGB 11.2 (L) 06/16/2021   HCT 34.6 (L) 06/16/2021   MCV 94.3 06/16/2021   PLT 224 06/16/2021      Chemistry      Component Value Date/Time   NA 136 06/16/2021 0950   NA 139 05/30/2014 0127   K 4.2 06/16/2021 0950   K 3.7 05/30/2014 0127   CL 101 06/16/2021 0950   CL 106 05/30/2014 0127   CO2 27 06/16/2021 0950   CO2 25 05/30/2014 0127   BUN 18 06/16/2021 0950   BUN 19 (H) 05/30/2014 0127   CREATININE 1.00 06/16/2021 0950   CREATININE 0.98 05/30/2014 0127      Component Value Date/Time   CALCIUM 9.0 06/16/2021 0950   CALCIUM 9.0 05/30/2014 0127   ALKPHOS 46 06/16/2021 0950   ALKPHOS 58 05/08/2014 1131   AST 22 06/16/2021 0950   AST 20 05/08/2014 1131   ALT 12 06/16/2021 0950   ALT 18 05/08/2014 1131   BILITOT 0.6 06/16/2021 0950   BILITOT 0.4 05/08/2014 1131         RADIOGRAPHIC STUDIES: I have personally reviewed the radiological images as listed and agreed with the findings in the report. No results found.   ASSESSMENT & PLAN:   Malignant neoplasm of overlapping  sites of left breast in female, estrogen receptor positive ([place]) #Left breast ipsilateral chest wall recurrence stage IV [2015]; currently on Faslodex plus abema.CT June 14th, 2022-no evidence of metastatic disease noted;   #  Continue Faslodex today; continue Verzenio at 50 mg twice daily.  Tolerating well. TM slightly elevated but trending down. Monitor tumor marker closely.    # Bilateral hip pain- arthritis-stable [s/p ortho evaluation]; on meloxicam.  # Mild anemia hemoglobin- 11-12/ CKD-;STABLE Stool test 1/3 positive. Declines GI evaluation.   # CKD- stage III- GFR 58-stable  # DISPOSITION: # shot today # follow up in  4weeks- with MD/labs- cbc/cmp ca- 27-29; Faslodex;-Dr.B  # I reviewed the blood work- with the patient in detail; also reviewed the imaging independently [as summarized above]; and with the patient in detail.       [name], MD 06/16/2021 11:17 AM

## 2021-06-16 NOTE — Assessment & Plan Note (Addendum)
#  Left breast ipsilateral chest wall recurrence stage IV [2015]; currently on Faslodex plus abema.CT June 14th, 2022-no evidence of metastatic disease noted;   # Continue Faslodex today; continue Verzenio at 50 mg twice daily.  Tolerating well. TM slightly elevated but trending down. Monitor tumor marker closely.    # Bilateral hip pain- arthritis-stable [s/p ortho evaluation]; on meloxicam.  # Mild anemia hemoglobin- 11-12/ CKD-;STABLE Stool test 1/3 positive. Declines GI evaluation.   # CKD- stage III- GFR 58-stable  # DISPOSITION: # shot today # follow up in  4weeks- with MD/labs- cbc/cmp ca- 27-29; Faslodex;-Dr.B  # I reviewed the blood work- with the patient in detail; also reviewed the imaging independently [as summarized above]; and with the patient in detail.

## 2021-06-17 LAB — CANCER ANTIGEN 27.29: CA 27.29: 47.1 U/mL — ABNORMAL HIGH (ref 0.0–38.6)

## 2021-06-20 ENCOUNTER — Other Ambulatory Visit (HOSPITAL_COMMUNITY): Payer: Self-pay

## 2021-07-06 ENCOUNTER — Other Ambulatory Visit (HOSPITAL_COMMUNITY): Payer: Self-pay

## 2021-07-13 ENCOUNTER — Other Ambulatory Visit: Payer: Self-pay | Admitting: *Deleted

## 2021-07-13 DIAGNOSIS — C50812 Malignant neoplasm of overlapping sites of left female breast: Secondary | ICD-10-CM

## 2021-07-13 DIAGNOSIS — Z170 Estrogen receptor positive status [ER+]: Secondary | ICD-10-CM

## 2021-07-15 ENCOUNTER — Inpatient Hospital Stay: Payer: Medicare HMO | Attending: Internal Medicine

## 2021-07-15 ENCOUNTER — Encounter: Payer: Self-pay | Admitting: Internal Medicine

## 2021-07-15 ENCOUNTER — Inpatient Hospital Stay (HOSPITAL_BASED_OUTPATIENT_CLINIC_OR_DEPARTMENT_OTHER): Payer: Medicare HMO | Admitting: Internal Medicine

## 2021-07-15 ENCOUNTER — Inpatient Hospital Stay: Payer: Medicare HMO

## 2021-07-15 DIAGNOSIS — Z170 Estrogen receptor positive status [ER+]: Secondary | ICD-10-CM | POA: Diagnosis not present

## 2021-07-15 DIAGNOSIS — C50812 Malignant neoplasm of overlapping sites of left female breast: Secondary | ICD-10-CM | POA: Diagnosis not present

## 2021-07-15 DIAGNOSIS — Z79899 Other long term (current) drug therapy: Secondary | ICD-10-CM | POA: Diagnosis not present

## 2021-07-15 DIAGNOSIS — C7989 Secondary malignant neoplasm of other specified sites: Secondary | ICD-10-CM | POA: Diagnosis not present

## 2021-07-15 DIAGNOSIS — Z5111 Encounter for antineoplastic chemotherapy: Secondary | ICD-10-CM | POA: Insufficient documentation

## 2021-07-15 DIAGNOSIS — C792 Secondary malignant neoplasm of skin: Secondary | ICD-10-CM | POA: Diagnosis not present

## 2021-07-15 LAB — COMPREHENSIVE METABOLIC PANEL
ALT: 13 U/L (ref 0–44)
AST: 24 U/L (ref 15–41)
Albumin: 3.9 g/dL (ref 3.5–5.0)
Alkaline Phosphatase: 49 U/L (ref 38–126)
Anion gap: 11 (ref 5–15)
BUN: 14 mg/dL (ref 8–23)
CO2: 24 mmol/L (ref 22–32)
Calcium: 9 mg/dL (ref 8.9–10.3)
Chloride: 103 mmol/L (ref 98–111)
Creatinine, Ser: 1.2 mg/dL — ABNORMAL HIGH (ref 0.44–1.00)
GFR, Estimated: 46 mL/min — ABNORMAL LOW (ref >60)
Glucose, Bld: 123 mg/dL — ABNORMAL HIGH (ref 70–99)
Potassium: 4.2 mmol/L (ref 3.5–5.1)
Sodium: 138 mmol/L (ref 135–145)
Total Bilirubin: 0.5 mg/dL (ref 0.3–1.2)
Total Protein: 7.6 g/dL (ref 6.5–8.1)

## 2021-07-15 LAB — CBC WITH DIFFERENTIAL/PLATELET
Abs Immature Granulocytes: 0.02 10*3/uL (ref 0.00–0.07)
Basophils Absolute: 0 10*3/uL (ref 0.0–0.1)
Basophils Relative: 1 %
Eosinophils Absolute: 0.2 10*3/uL (ref 0.0–0.5)
Eosinophils Relative: 4 %
HCT: 36.2 % (ref 36.0–46.0)
Hemoglobin: 11.3 g/dL — ABNORMAL LOW (ref 12.0–15.0)
Immature Granulocytes: 0 %
Lymphocytes Relative: 39 %
Lymphs Abs: 2.3 10*3/uL (ref 0.7–4.0)
MCH: 30.5 pg (ref 26.0–34.0)
MCHC: 31.2 g/dL (ref 30.0–36.0)
MCV: 97.8 fL (ref 80.0–100.0)
Monocytes Absolute: 0.5 10*3/uL (ref 0.1–1.0)
Monocytes Relative: 8 %
Neutro Abs: 2.9 10*3/uL (ref 1.7–7.7)
Neutrophils Relative %: 48 %
Platelets: 226 10*3/uL (ref 150–400)
RBC: 3.7 MIL/uL — ABNORMAL LOW (ref 3.87–5.11)
RDW: 13.2 % (ref 11.5–15.5)
WBC: 6 10*3/uL (ref 4.0–10.5)
nRBC: 0 % (ref 0.0–0.2)

## 2021-07-15 MED ORDER — FULVESTRANT 250 MG/5ML IM SOLN
500.0000 mg | Freq: Once | INTRAMUSCULAR | Status: AC
Start: 2021-07-15 — End: 2021-07-15
  Administered 2021-07-15: 500 mg via INTRAMUSCULAR
  Filled 2021-07-15: qty 10

## 2021-07-15 NOTE — Assessment & Plan Note (Signed)
#  Left breast ipsilateral chest wall recurrence stage IV [2015]; currently on Faslodex plus abema.CT June 14th, 2022-no evidence of metastatic disease noted; -stable  # Continue Faslodex today; continue Verzenio at 50 mg twice daily.  Tolerating well. TM slightly elevated but trending down. Monitor tumor marker closely.    # Bilateral hip pain- arthritis--stable [s/p ortho evaluation]; on meloxicam.  # Mild anemia hemoglobin- 11-12/ CKD-;-stable Stool test 1/3 positive. Declines GI evaluation.   # CKD- stage III- GFR 48- -stable  # DISPOSITION: # shot today # follow up in  4weeks- with MD/labs- cbc/cmp ca- 27-29; Faslodex;-Dr.B

## 2021-07-15 NOTE — Progress Notes (Signed)
[place] OFFICE PROGRESS NOTE  Patient Care Team: [name], MD as PCP - General (Internal Medicine) Bary Castilla, Forest Gleason, MD (General Surgery) Marden Noble, MD (Internal Medicine) [name], MD as Consulting Physician (Internal Medicine)  Dr.Charles Mayer Masker; [place]  SUMMARY OF ONCOLOGIC HISTORY:  Oncology History Overview Note  # 2007- LEFT BREAST CA STAGE II [T2N6m; s/p mastec; Dr.Byrnett] ER-Pos/PR Neg; Her- 2 Neu- NEG; ACx4-Taxol x12;Femara; stopped May 2009-stopped follow up.  #JAN 2015- Post mastectomy Chest wall recurrence [chest wall mass bx-]; ER- 90%; PR- NEG; Her2 Neu- NEG; s/p excision [involving skeletal muscle/adipose tissue; clear margins]; CT July 2017- NED;   # Jan 2020- progression/ hilar- START abema [jan 14th]+ faslodex[jan 13th]  # DEC 2021- anemia/? CKD [stool ]  # OSTEOPENIA [BMD- June 2016]  # Kidney cysts  DIAGNOSIS: LEFT BREAST CA  STAGE: IV- NED  ;GOALS: pallaitive  CURRENT/MOST RECENT THERAPY: faslodex+ Abema    Breast cancer metastasized to skin ([place]  02/05/2014 Initial Diagnosis   Breast cancer metastasized to skin ([facility]    Malignant neoplasm of overlapping sites of left breast in female, estrogen receptor positive ([facility]  07/24/2016 Initial Diagnosis   Cancer of overlapping sites of left female breast ([facility]    01/06/2019 -  Chemotherapy   The patient had fulvestrant (FASLODEX) injection 500 mg, 500 mg, Intramuscular,  Once, 1 of 4 cycles Dose modification: 500 mg (original dose 500 mg, Cycle 1) Administration: 500 mg (08/05/2020)   for chemotherapy treatment.      INTERVAL HISTORY: Accompanied by family  A very pleasant 80year old African-American female patient with above history of ER/PR positive breast cancer stage IV currently on Faslodex + Verzenio is here for follow-up.  Denies having any chest pain but denies any worsening cough.  No worsening bone pain.  No  diarrhea.  Review of Systems  Constitutional:  Positive for malaise/fatigue. Negative for chills, diaphoresis, fever and weight loss.  HENT:  Negative for nosebleeds and sore throat.   Eyes:  Negative for double vision.  Respiratory:  Negative for cough, hemoptysis, sputum production, shortness of breath and wheezing.   Cardiovascular:  Negative for chest pain, palpitations, orthopnea and leg swelling.  Gastrointestinal:  Negative for abdominal pain, blood in stool, constipation, diarrhea, heartburn, melena, nausea and vomiting.  Musculoskeletal:  Positive for back pain and joint pain.  Skin: Negative.  Negative for itching and rash.  Neurological:  Negative for tingling, focal weakness, weakness and headaches.  Endo/Heme/Allergies:  Does not bruise/bleed easily.  Psychiatric/Behavioral:  Negative for depression. The patient is not nervous/anxious and does not have insomnia.   PAST MEDICAL HISTORY :  Past Medical History:  Diagnosis Date   [place]neurysm ([facility] 2007   Arthritis    Cancer ([facility] 2007   diagnosed 01/07 left breast with simple mastectomu & sn bx. The pt had a 3.5cm tumor. Frozen section report on the 2 sn were negative. On permanent sections a 0.550mmicrometastasis was identified. She was not felt to require a complete axillary dissection. She has completed her Tamoxifen therapy and has been released from routine f/u with medical oncology service.   Diabetes mellitus without complication ([facility]   Hyperlipidemia    Hypertension    since age 80 [facility]   Mastectomy site recurrence resected 01/26/2014, ER 90%, PR 0%, HER-2/neu nonamplified.   Other benign neoplasm of connective and other soft tissue of trunk, unspecified  2014   Personal history of colonic polyps    Personal history of tobacco use, presenting hazards to health    Special screening for malignant neoplasms, colon    Unspecified disorder of skin  and subcutaneous tissue 02/19/2013   biopsy of skin over the sternum on the right breast showed hypertrophic scar,keloid formation.    PAST SURGICAL HISTORY :   Past Surgical History:  Procedure Laterality Date   ABDOMINAL HYSTERECTOMY  2004   total   BREAST BIOPSY Right January 26, 2014   Core biopsy for mild increase breast uptake on PET scan, usual ductal hyperplasia and stromal fibrosis   BREAST SURGERY Left 2007   mastectomy   chest wall mass  01/26/14   CHOLECYSTECTOMY  2007   COLONOSCOPY W/ POLYPECTOMY  2011   Dr. Brynda Greathouse   EYE SURGERY Right    cataract surgery   head surgery  1991   MASTECTOMY Left 2007   PORT-A-CATH REMOVAL  2008   PORTACATH PLACEMENT  2007    FAMILY HISTORY :   Family History  Problem Relation Age of Onset   Cancer Other        ovarian,breast,colon cancers;relations not listed   Breast cancer Neg Hx     SOCIAL HISTORY:   Social History   Tobacco Use   Smoking status: Some Days    Packs/day: 1.00    Years: 50.00    Pack years: 50.00    Types: Cigarettes   Smokeless tobacco: Never  Substance Use Topics   Alcohol use: No    Alcohol/week: 0.0 standard drinks   Drug use: No    ALLERGIES:  is allergic to metoprolol.  MEDICATIONS:  Current Outpatient Medications  Medication Sig Dispense Refill   abemaciclib (VERZENIO) 50 MG tablet TAKE 1 TABLET BY MOUTH TWO TIMES DAILY 56 tablet 3   amLODipine (NORVASC) 10 MG tablet Take 1 tablet (10 mg total) by mouth daily. 90 tablet 1   atorvastatin (LIPITOR) 20 MG tablet Take 20 mg by mouth daily at 6 PM.      Calcium Carbonate-Vitamin D 600-200 MG-UNIT CAPS Take 200 capsules by mouth once.     citalopram (CELEXA) 40 MG tablet Take 1 tablet (40 mg total) by mouth daily. 90 tablet 1   lisinopril (ZESTRIL) 2.5 MG tablet Take 1 tablet (2.5 mg total) by mouth daily. 90 tablet 1   meloxicam (MOBIC) 15 MG tablet      No current facility-administered medications for this visit.    PHYSICAL  EXAMINATION: ECOG PERFORMANCE STATUS: 0 - Asymptomatic  BP (!) 149/76 (BP Location: Right Arm, Patient Position: Sitting, Cuff Size: Normal)   Pulse 69   Temp 98.9 F (37.2 C) (Oral)   Resp 16   Ht 5' 6"  (1.676 m)   Wt 152 lb (68.9 kg)   SpO2 99%   BMI 24.53 kg/m   Filed Weights   07/15/21 1018  Weight: 152 lb (68.9 kg)    Physical Exam Constitutional:      Comments:   She is walking by self.  HENT:     Head: Normocephalic and atraumatic.     Mouth/Throat:     Pharynx: No oropharyngeal exudate.  Eyes:     Pupils: Pupils are equal, round, and reactive to light.  Cardiovascular:     Rate and Rhythm: Normal rate and regular rhythm.  Pulmonary:     Effort: No respiratory distress.     Breath sounds: No wheezing.  Abdominal:  General: Bowel sounds are normal. There is no distension.     Palpations: Abdomen is soft. There is no mass.     Tenderness: no abdominal tenderness There is no guarding or rebound.  Musculoskeletal:        General: No tenderness. Normal range of motion.     Cervical back: Normal range of motion and neck supple.  Skin:    General: Skin is warm.  Neurological:     Mental Status: She is alert and oriented to person, place, and time.  Psychiatric:        Mood and Affect: Affect normal.   LABORATORY DATA:  I have reviewed the data as listed    Component Value Date/Time   NA 138 07/15/2021 1008   NA 139 05/30/2014 0127   K 4.2 07/15/2021 1008   K 3.7 05/30/2014 0127   CL 103 07/15/2021 1008   CL 106 05/30/2014 0127   CO2 24 07/15/2021 1008   CO2 25 05/30/2014 0127   GLUCOSE 123 (H) 07/15/2021 1008   GLUCOSE 136 (H) 05/30/2014 0127   BUN 14 07/15/2021 1008   BUN 19 (H) 05/30/2014 0127   CREATININE 1.20 (H) 07/15/2021 1008   CREATININE 0.98 05/30/2014 0127   CALCIUM 9.0 07/15/2021 1008   CALCIUM 9.0 05/30/2014 0127   PROT 7.6 07/15/2021 1008   PROT 7.8 05/08/2014 1131   ALBUMIN 3.9 07/15/2021 1008   ALBUMIN 3.7 05/08/2014 1131   AST  24 07/15/2021 1008   AST 20 05/08/2014 1131   ALT 13 07/15/2021 1008   ALT 18 05/08/2014 1131   ALKPHOS 49 07/15/2021 1008   ALKPHOS 58 05/08/2014 1131   BILITOT 0.5 07/15/2021 1008   BILITOT 0.4 05/08/2014 1131   GFRNONAA 46 (L) 07/15/2021 1008   GFRNONAA 58 (L) 05/30/2014 0127   GFRAA 47 (L) 09/02/2020 0920   GFRAA >60 05/30/2014 0127    No results found for: SPEP, UPEP  Lab Results  Component Value Date   WBC 6.0 07/15/2021   NEUTROABS 2.9 07/15/2021   HGB 11.3 (L) 07/15/2021   HCT 36.2 07/15/2021   MCV 97.8 07/15/2021   PLT 226 07/15/2021      Chemistry      Component Value Date/Time   NA 138 07/15/2021 1008   NA 139 05/30/2014 0127   K 4.2 07/15/2021 1008   K 3.7 05/30/2014 0127   CL 103 07/15/2021 1008   CL 106 05/30/2014 0127   CO2 24 07/15/2021 1008   CO2 25 05/30/2014 0127   BUN 14 07/15/2021 1008   BUN 19 (H) 05/30/2014 0127   CREATININE 1.20 (H) 07/15/2021 1008   CREATININE 0.98 05/30/2014 0127      Component Value Date/Time   CALCIUM 9.0 07/15/2021 1008   CALCIUM 9.0 05/30/2014 0127   ALKPHOS 49 07/15/2021 1008   ALKPHOS 58 05/08/2014 1131   AST 24 07/15/2021 1008   AST 20 05/08/2014 1131   ALT 13 07/15/2021 1008   ALT 18 05/08/2014 1131   BILITOT 0.5 07/15/2021 1008   BILITOT 0.4 05/08/2014 1131         RADIOGRAPHIC STUDIES: I have personally reviewed the radiological images as listed and agreed with the findings in the report. No results found.   ASSESSMENT & PLAN:   Malignant neoplasm of overlapping sites of left breast in female, estrogen receptor positive ([place]) #Left breast ipsilateral chest wall recurrence stage IV [2015]; currently on Faslodex plus abema.CT June 14th, 2022-no evidence of metastatic disease noted; -stable  #  Continue Faslodex today; continue Verzenio at 50 mg twice daily.  Tolerating well. TM slightly elevated but trending down. Monitor tumor marker closely.    # Bilateral hip pain- arthritis--stable [s/p ortho  evaluation]; on meloxicam.  # Mild anemia hemoglobin- 11-12/ CKD-;-stable Stool test 1/3 positive. Declines GI evaluation.   # CKD- stage III- GFR 48- -stable  # DISPOSITION: # shot today # follow up in  4weeks- with MD/labs- cbc/cmp ca- 27-29; Faslodex;-Dr.B      [name], MD 07/15/2021 2:07 PM

## 2021-07-16 LAB — CANCER ANTIGEN 27.29: CA 27.29: 50.3 U/mL — ABNORMAL HIGH (ref 0.0–38.6)

## 2021-07-29 ENCOUNTER — Other Ambulatory Visit (HOSPITAL_COMMUNITY): Payer: Self-pay

## 2021-07-29 MED FILL — Abemaciclib Tab 50 MG: ORAL | 28 days supply | Qty: 56 | Fill #3 | Status: AC

## 2021-08-01 ENCOUNTER — Other Ambulatory Visit (HOSPITAL_COMMUNITY): Payer: Self-pay

## 2021-08-12 ENCOUNTER — Inpatient Hospital Stay: Payer: Medicare HMO

## 2021-08-12 ENCOUNTER — Inpatient Hospital Stay: Payer: Medicare HMO | Admitting: Internal Medicine

## 2021-08-23 ENCOUNTER — Other Ambulatory Visit (HOSPITAL_COMMUNITY): Payer: Self-pay

## 2021-08-25 ENCOUNTER — Other Ambulatory Visit: Payer: Self-pay | Admitting: Internal Medicine

## 2021-08-25 ENCOUNTER — Other Ambulatory Visit (HOSPITAL_COMMUNITY): Payer: Self-pay

## 2021-08-25 MED ORDER — ABEMACICLIB 50 MG PO TABS
ORAL_TABLET | ORAL | 3 refills | Status: DC
  Filled 2021-08-25: qty 56, 28d supply, fill #0
  Filled 2021-10-05: qty 56, 28d supply, fill #1

## 2021-08-26 ENCOUNTER — Inpatient Hospital Stay: Payer: Medicare HMO

## 2021-08-26 ENCOUNTER — Inpatient Hospital Stay: Payer: Medicare HMO | Attending: Internal Medicine | Admitting: Internal Medicine

## 2021-08-26 ENCOUNTER — Encounter: Payer: Self-pay | Admitting: Internal Medicine

## 2021-08-26 DIAGNOSIS — Z170 Estrogen receptor positive status [ER+]: Secondary | ICD-10-CM | POA: Diagnosis not present

## 2021-08-26 DIAGNOSIS — C792 Secondary malignant neoplasm of skin: Secondary | ICD-10-CM | POA: Diagnosis not present

## 2021-08-26 DIAGNOSIS — C50812 Malignant neoplasm of overlapping sites of left female breast: Secondary | ICD-10-CM

## 2021-08-26 DIAGNOSIS — Z79899 Other long term (current) drug therapy: Secondary | ICD-10-CM | POA: Diagnosis not present

## 2021-08-26 DIAGNOSIS — Z5111 Encounter for antineoplastic chemotherapy: Secondary | ICD-10-CM | POA: Diagnosis not present

## 2021-08-26 DIAGNOSIS — C7989 Secondary malignant neoplasm of other specified sites: Secondary | ICD-10-CM | POA: Insufficient documentation

## 2021-08-26 LAB — CBC WITH DIFFERENTIAL/PLATELET
Abs Immature Granulocytes: 0.01 10*3/uL (ref 0.00–0.07)
Basophils Absolute: 0 10*3/uL (ref 0.0–0.1)
Basophils Relative: 1 %
Eosinophils Absolute: 0.2 10*3/uL (ref 0.0–0.5)
Eosinophils Relative: 4 %
HCT: 36.8 % (ref 36.0–46.0)
Hemoglobin: 11.7 g/dL — ABNORMAL LOW (ref 12.0–15.0)
Immature Granulocytes: 0 %
Lymphocytes Relative: 33 %
Lymphs Abs: 2 10*3/uL (ref 0.7–4.0)
MCH: 30.5 pg (ref 26.0–34.0)
MCHC: 31.8 g/dL (ref 30.0–36.0)
MCV: 95.8 fL (ref 80.0–100.0)
Monocytes Absolute: 0.6 10*3/uL (ref 0.1–1.0)
Monocytes Relative: 10 %
Neutro Abs: 3.2 10*3/uL (ref 1.7–7.7)
Neutrophils Relative %: 52 %
Platelets: 221 10*3/uL (ref 150–400)
RBC: 3.84 MIL/uL — ABNORMAL LOW (ref 3.87–5.11)
RDW: 12.7 % (ref 11.5–15.5)
WBC: 6 10*3/uL (ref 4.0–10.5)
nRBC: 0 % (ref 0.0–0.2)

## 2021-08-26 LAB — COMPREHENSIVE METABOLIC PANEL
ALT: 16 U/L (ref 0–44)
AST: 25 U/L (ref 15–41)
Albumin: 4 g/dL (ref 3.5–5.0)
Alkaline Phosphatase: 59 U/L (ref 38–126)
Anion gap: 10 (ref 5–15)
BUN: 15 mg/dL (ref 8–23)
CO2: 27 mmol/L (ref 22–32)
Calcium: 9.1 mg/dL (ref 8.9–10.3)
Chloride: 99 mmol/L (ref 98–111)
Creatinine, Ser: 0.89 mg/dL (ref 0.44–1.00)
GFR, Estimated: >60 mL/min (ref >60)
Glucose, Bld: 152 mg/dL — ABNORMAL HIGH (ref 70–99)
Potassium: 4.2 mmol/L (ref 3.5–5.1)
Sodium: 136 mmol/L (ref 135–145)
Total Bilirubin: 0.6 mg/dL (ref 0.3–1.2)
Total Protein: 8.2 g/dL — ABNORMAL HIGH (ref 6.5–8.1)

## 2021-08-26 MED ORDER — CITALOPRAM HYDROBROMIDE 40 MG PO TABS: 40.0000 mg | ORAL_TABLET | Freq: Every day | ORAL | 1 refills | Status: DC

## 2021-08-26 MED ORDER — FULVESTRANT 250 MG/5ML IM SOLN
500.0000 mg | Freq: Once | INTRAMUSCULAR | Status: AC
Start: 2021-08-26 — End: 2021-08-26
  Administered 2021-08-26: 500 mg via INTRAMUSCULAR
  Filled 2021-08-26: qty 10

## 2021-08-26 NOTE — Progress Notes (Signed)
[place] OFFICE PROGRESS NOTE  Patient Care Team: [name], MD as PCP - General (Internal Medicine) Bary Castilla, Forest Gleason, MD (General Surgery) Marden Noble, MD (Internal Medicine) [name], MD as Consulting Physician (Internal Medicine)  Dr.Charles Mayer Masker; [place]  SUMMARY OF ONCOLOGIC HISTORY:  Oncology History Overview Note  # 2007- LEFT BREAST CA STAGE II [T2N67m; s/p mastec; Dr.Byrnett] ER-Pos/PR Neg; Her- 2 Neu- NEG; ACx4-Taxol x12;Femara; stopped May 2009-stopped follow up.  #JAN 2015- Post mastectomy Chest wall recurrence [chest wall mass bx-]; ER- 90%; PR- NEG; Her2 Neu- NEG; s/p excision [involving skeletal muscle/adipose tissue; clear margins]; CT July 2017- NED;   # Jan 2020- progression/ hilar- START abema [jan 14th]+ faslodex[jan 13th]  # DEC 2021- anemia/? CKD [stool ]  # OSTEOPENIA [BMD- June 2016]  # Kidney cysts  DIAGNOSIS: LEFT BREAST CA  STAGE: IV- NED  ;GOALS: pallaitive  CURRENT/MOST RECENT THERAPY: faslodex+ Abema    Breast cancer metastasized to skin ([place]  02/05/2014 Initial Diagnosis   Breast cancer [place]asized to skin ([place]   Malignant neoplasm of overlapping sites of left breast in female, estrogen receptor positive ([place]  07/24/2016 Initial Diagnosis   Cancer of overlapping sites of left female breast ([facility]   01/06/2019 -  Chemotherapy   The patient had fulvestrant (FASLODEX) injection 500 mg, 500 mg, Intramuscular,  Once, 1 of 4 cycles Dose modification: 500 mg (original dose 500 mg, Cycle 1) Administration: 500 mg (08/05/2020)   for chemotherapy treatment.      INTERVAL HISTORY: Accompanied by family  A very pleasant 80year old African-American female patient with above history of ER/PR positive breast cancer stage IV currently on Faslodex + Verzenio is here for follow-up.  No new shortness of breath or cough.  No fevers or chills.  Chronic mild joint pain not any worse.  No  diarrhea.  Review of Systems  Constitutional:  Positive for malaise/fatigue. Negative for chills, diaphoresis, fever and weight loss.  HENT:  Negative for nosebleeds and sore throat.   Eyes:  Negative for double vision.  Respiratory:  Negative for cough, hemoptysis, sputum production, shortness of breath and wheezing.   Cardiovascular:  Negative for chest pain, palpitations, orthopnea and leg swelling.  Gastrointestinal:  Negative for abdominal pain, blood in stool, constipation, diarrhea, heartburn, melena, nausea and vomiting.  Musculoskeletal:  Positive for back pain and joint pain.  Skin: Negative.  Negative for itching and rash.  Neurological:  Negative for tingling, focal weakness, weakness and headaches.  Endo/Heme/Allergies:  Does not bruise/bleed easily.  Psychiatric/Behavioral:  Negative for depression. The patient is not nervous/anxious and does not have insomnia.   PAST MEDICAL HISTORY :  Past Medical History:  Diagnosis Date   [place]neurysm ([place]   Arthritis    Cancer ([place] 2007   diagnosed 01/07 left breast with simple mastectomu & sn bx. The pt had a 3.5cm tumor. Frozen section report on the 2 sn were negative. On permanent sections a 0.552mmicrometastasis was identified. She was not felt to require a complete axillary dissection. She has completed her Tamoxifen therapy and has been released from routine f/u with medical oncology service.   Diabetes mellitus without complication ([facility]   Hyperlipidemia    Hypertension    since age 80 Malignant [facility] of [facility]outer quadrant [facility]   Mastectomy site recurrence resected 01/26/2014, ER 90%, PR 0%, HER-2/neu nonamplified.   Other benign neoplasm of connective and other soft tissue  of trunk, unspecified 2014   Personal history of colonic polyps    Personal history of tobacco use, presenting hazards to health    Special screening for malignant neoplasms, colon    Unspecified disorder of skin  and subcutaneous tissue 02/19/2013   biopsy of skin over the sternum on the right breast showed hypertrophic scar,keloid formation.    PAST SURGICAL HISTORY :   Past Surgical History:  Procedure Laterality Date   ABDOMINAL HYSTERECTOMY  2004   total   BREAST BIOPSY Right January 26, 2014   Core biopsy for mild increase breast uptake on PET scan, usual ductal hyperplasia and stromal fibrosis   BREAST SURGERY Left 2007   mastectomy   chest wall mass  01/26/14   CHOLECYSTECTOMY  2007   COLONOSCOPY W/ POLYPECTOMY  2011   Dr. Brynda Greathouse   EYE SURGERY Right    cataract surgery   head surgery  1991   MASTECTOMY Left 2007   PORT-A-CATH REMOVAL  2008   PORTACATH PLACEMENT  2007    FAMILY HISTORY :   Family History  Problem Relation Age of Onset   Cancer Other        ovarian,breast,colon cancers;relations not listed   Breast cancer Neg Hx     SOCIAL HISTORY:   Social History   Tobacco Use   Smoking status: Some Days    Packs/day: 1.00    Years: 50.00    Pack years: 50.00    Types: Cigarettes   Smokeless tobacco: Never  Substance Use Topics   Alcohol use: No    Alcohol/week: 0.0 standard drinks   Drug use: No    ALLERGIES:  is allergic to metoprolol.  MEDICATIONS:  Current Outpatient Medications  Medication Sig Dispense Refill   abemaciclib (VERZENIO) 50 MG tablet TAKE 1 TABLET BY MOUTH TWO TIMES DAILY 56 tablet 3   amLODipine (NORVASC) 10 MG tablet Take 1 tablet (10 mg total) by mouth daily. 90 tablet 1   atorvastatin (LIPITOR) 20 MG tablet Take 20 mg by mouth daily at 6 PM.      Calcium Carbonate-Vitamin D 600-200 MG-UNIT CAPS Take 200 capsules by mouth once.     lisinopril (ZESTRIL) 2.5 MG tablet Take 1 tablet (2.5 mg total) by mouth daily. 90 tablet 1   meloxicam (MOBIC) 15 MG tablet      citalopram (CELEXA) 40 MG tablet Take 1 tablet (40 mg total) by mouth daily. 90 tablet 1   No current facility-administered medications for this visit.    PHYSICAL  EXAMINATION: ECOG PERFORMANCE STATUS: 0 - Asymptomatic  BP (!) 154/71 (BP Location: Right Arm, Patient Position: Sitting, Cuff Size: Normal)   Pulse 77   Temp 98.3 F (36.8 C) (Tympanic)   Resp 16   Ht 5' 6"  (1.676 m)   Wt 152 lb 9.6 oz (69.2 kg)   SpO2 100%   BMI 24.63 kg/m   Filed Weights   08/26/21 1039  Weight: 152 lb 9.6 oz (69.2 kg)    Physical Exam Constitutional:      Comments:   She is walking by self.  HENT:     Head: Normocephalic and atraumatic.     Mouth/Throat:     Pharynx: No oropharyngeal exudate.  Eyes:     Pupils: Pupils are equal, round, and reactive to light.  Cardiovascular:     Rate and Rhythm: Normal rate and regular rhythm.  Pulmonary:     Effort: No respiratory distress.     Breath sounds: No  wheezing.  Abdominal:     General: Bowel sounds are normal. There is no distension.     Palpations: Abdomen is soft. There is no mass.     Tenderness: no abdominal tenderness There is no guarding or rebound.  Musculoskeletal:        General: No tenderness. Normal range of motion.     Cervical back: Normal range of motion and neck supple.  Skin:    General: Skin is warm.  Neurological:     Mental Status: She is alert and oriented to person, place, and time.  Psychiatric:        Mood and Affect: Affect normal.   LABORATORY DATA:  I have reviewed the data as listed    Component Value Date/Time   NA 136 08/26/2021 0954   NA 139 05/30/2014 0127   K 4.2 08/26/2021 0954   K 3.7 05/30/2014 0127   CL 99 08/26/2021 0954   CL 106 05/30/2014 0127   CO2 27 08/26/2021 0954   CO2 25 05/30/2014 0127   GLUCOSE 152 (H) 08/26/2021 0954   GLUCOSE 136 (H) 05/30/2014 0127   BUN 15 08/26/2021 0954   BUN 19 (H) 05/30/2014 0127   CREATININE 0.89 08/26/2021 0954   CREATININE 0.98 05/30/2014 0127   CALCIUM 9.1 08/26/2021 0954   CALCIUM 9.0 05/30/2014 0127   PROT 8.2 (H) 08/26/2021 0954   PROT 7.8 05/08/2014 1131   ALBUMIN 4.0 08/26/2021 0954   ALBUMIN 3.7  05/08/2014 1131   AST 25 08/26/2021 0954   AST 20 05/08/2014 1131   ALT 16 08/26/2021 0954   ALT 18 05/08/2014 1131   ALKPHOS 59 08/26/2021 0954   ALKPHOS 58 05/08/2014 1131   BILITOT 0.6 08/26/2021 0954   BILITOT 0.4 05/08/2014 1131   GFRNONAA >60 08/26/2021 0954   GFRNONAA 58 (L) 05/30/2014 0127   GFRAA 47 (L) 09/02/2020 0920   GFRAA >60 05/30/2014 0127    No results found for: SPEP, UPEP  Lab Results  Component Value Date   WBC 6.0 08/26/2021   NEUTROABS 3.2 08/26/2021   HGB 11.7 (L) 08/26/2021   HCT 36.8 08/26/2021   MCV 95.8 08/26/2021   PLT 221 08/26/2021      Chemistry      Component Value Date/Time   NA 136 08/26/2021 0954   NA 139 05/30/2014 0127   K 4.2 08/26/2021 0954   K 3.7 05/30/2014 0127   CL 99 08/26/2021 0954   CL 106 05/30/2014 0127   CO2 27 08/26/2021 0954   CO2 25 05/30/2014 0127   BUN 15 08/26/2021 0954   BUN 19 (H) 05/30/2014 0127   CREATININE 0.89 08/26/2021 0954   CREATININE 0.98 05/30/2014 0127      Component Value Date/Time   CALCIUM 9.1 08/26/2021 0954   CALCIUM 9.0 05/30/2014 0127   ALKPHOS 59 08/26/2021 0954   ALKPHOS 58 05/08/2014 1131   AST 25 08/26/2021 0954   AST 20 05/08/2014 1131   ALT 16 08/26/2021 0954   ALT 18 05/08/2014 1131   BILITOT 0.6 08/26/2021 0954   BILITOT 0.4 05/08/2014 1131         RADIOGRAPHIC STUDIES: I have personally reviewed the radiological images as listed and agreed with the findings in the report. No results found.   ASSESSMENT & PLAN:   Malignant neoplasm of overlapping sites of left breast in female, estrogen receptor positive ([place]) #Left breast ipsilateral chest wall recurrence stage IV [2015]; currently on Faslodex plus abema.CT June 14th, 2022-no evidence  of metastatic disease noted;-stable  # Continue Faslodex today; continue Verzenio at 50 mg twice daily.  Tolerating well. TM slightly elevated but trending down. Monitor tumor marker closely.    # Bilateral hip pain- arthritis-stable  [s/p ortho evaluation]; on meloxicam.  # Mild anemia hemoglobin- 11-12/ CKD-;-stable Stool test 1/3 positive. Declines GI evaluation.   # CKD- stage III- GFR 48- -stable  # Anxiety/depression- celexa- refilled.   # DISPOSITION: # shot today # follow up in  4weeks- with MD/labs- cbc/cmp ca- 27-29; Faslodex;-Dr.B      [name], MD 08/26/2021 1:05 PM

## 2021-08-26 NOTE — Assessment & Plan Note (Signed)
#  Left breast ipsilateral chest wall recurrence stage IV [2015]; currently on Faslodex plus abema.CT June 14th, 2022-no evidence of metastatic disease noted;-stable  # Continue Faslodex today; continue Verzenio at 50 mg twice daily.  Tolerating well. TM slightly elevated but trending down. Monitor tumor marker closely.    # Bilateral hip pain- arthritis-stable [s/p ortho evaluation]; on meloxicam.  # Mild anemia hemoglobin- 11-12/ CKD-;-stable Stool test 1/3 positive. Declines GI evaluation.   # CKD- stage III- GFR 48- -stable  # Anxiety/depression- celexa- refilled.   # DISPOSITION: # shot today # follow up in  4weeks- with MD/labs- cbc/cmp ca- 27-29; Faslodex;-Dr.B

## 2021-08-27 LAB — CANCER ANTIGEN 27.29: CA 27.29: 62.8 U/mL — ABNORMAL HIGH (ref 0.0–38.6)

## 2021-09-05 DIAGNOSIS — S71111A Laceration without foreign body, right thigh, initial encounter: Secondary | ICD-10-CM | POA: Diagnosis not present

## 2021-09-05 DIAGNOSIS — S81811A Laceration without foreign body, right lower leg, initial encounter: Secondary | ICD-10-CM | POA: Diagnosis not present

## 2021-09-05 DIAGNOSIS — Z23 Encounter for immunization: Secondary | ICD-10-CM | POA: Diagnosis not present

## 2021-09-11 ENCOUNTER — Other Ambulatory Visit: Payer: Self-pay | Admitting: Internal Medicine

## 2021-09-12 ENCOUNTER — Encounter: Payer: Self-pay | Admitting: Internal Medicine

## 2021-09-19 DIAGNOSIS — Z4802 Encounter for removal of sutures: Secondary | ICD-10-CM | POA: Diagnosis not present

## 2021-09-20 ENCOUNTER — Other Ambulatory Visit (HOSPITAL_COMMUNITY): Payer: Self-pay

## 2021-09-22 ENCOUNTER — Telehealth: Payer: Self-pay | Admitting: *Deleted

## 2021-09-22 ENCOUNTER — Other Ambulatory Visit (HOSPITAL_COMMUNITY): Payer: Self-pay
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
## 2021-09-23 ENCOUNTER — Inpatient Hospital Stay: Payer: Medicare HMO

## 2021-09-23 ENCOUNTER — Inpatient Hospital Stay: Payer: Medicare HMO | Admitting: Internal Medicine

## 2021-09-26 ENCOUNTER — Other Ambulatory Visit (HOSPITAL_COMMUNITY): Payer: Self-pay

## 2021-09-27 ENCOUNTER — Inpatient Hospital Stay: Payer: Medicare HMO

## 2021-09-27 ENCOUNTER — Inpatient Hospital Stay: Payer: Medicare HMO | Admitting: Internal Medicine

## 2021-09-27 ENCOUNTER — Encounter: Payer: Self-pay | Admitting: Internal Medicine

## 2021-09-28 ENCOUNTER — Other Ambulatory Visit: Payer: Self-pay

## 2021-09-28 ENCOUNTER — Inpatient Hospital Stay: Payer: Medicare HMO

## 2021-09-28 ENCOUNTER — Other Ambulatory Visit (HOSPITAL_COMMUNITY): Payer: Self-pay

## 2021-09-28 ENCOUNTER — Inpatient Hospital Stay: Payer: Medicare HMO | Attending: Internal Medicine

## 2021-09-28 ENCOUNTER — Inpatient Hospital Stay (HOSPITAL_BASED_OUTPATIENT_CLINIC_OR_DEPARTMENT_OTHER): Payer: Medicare HMO | Admitting: Internal Medicine

## 2021-09-28 VITALS — BP 144/75 | HR 79 | Temp 97.6°F | Resp 18 | Ht 66.0 in | Wt 156.0 lb

## 2021-09-28 DIAGNOSIS — Z170 Estrogen receptor positive status [ER+]: Secondary | ICD-10-CM

## 2021-09-28 DIAGNOSIS — C792 Secondary malignant neoplasm of skin: Secondary | ICD-10-CM | POA: Diagnosis not present

## 2021-09-28 DIAGNOSIS — C50812 Malignant neoplasm of overlapping sites of left female breast: Secondary | ICD-10-CM

## 2021-09-28 DIAGNOSIS — Z5111 Encounter for antineoplastic chemotherapy: Secondary | ICD-10-CM | POA: Insufficient documentation

## 2021-09-28 DIAGNOSIS — D649 Anemia, unspecified: Secondary | ICD-10-CM | POA: Diagnosis not present

## 2021-09-28 LAB — CBC WITH DIFFERENTIAL/PLATELET
Abs Immature Granulocytes: 0.01 10*3/uL (ref 0.00–0.07)
Basophils Absolute: 0 10*3/uL (ref 0.0–0.1)
Basophils Relative: 1 %
Eosinophils Absolute: 0.2 10*3/uL (ref 0.0–0.5)
Eosinophils Relative: 4 %
HCT: 37 % (ref 36.0–46.0)
Hemoglobin: 11.8 g/dL — ABNORMAL LOW (ref 12.0–15.0)
Immature Granulocytes: 0 %
Lymphocytes Relative: 45 %
Lymphs Abs: 2.4 10*3/uL (ref 0.7–4.0)
MCH: 29.9 pg (ref 26.0–34.0)
MCHC: 31.9 g/dL (ref 30.0–36.0)
MCV: 93.9 fL (ref 80.0–100.0)
Monocytes Absolute: 0.5 10*3/uL (ref 0.1–1.0)
Monocytes Relative: 10 %
Neutro Abs: 2.1 10*3/uL (ref 1.7–7.7)
Neutrophils Relative %: 40 %
Platelets: 231 10*3/uL (ref 150–400)
RBC: 3.94 MIL/uL (ref 3.87–5.11)
RDW: 12.9 % (ref 11.5–15.5)
WBC: 5.3 10*3/uL (ref 4.0–10.5)
nRBC: 0 % (ref 0.0–0.2)

## 2021-09-28 LAB — COMPREHENSIVE METABOLIC PANEL
ALT: 14 U/L (ref 0–44)
AST: 26 U/L (ref 15–41)
Albumin: 4 g/dL (ref 3.5–5.0)
Alkaline Phosphatase: 67 U/L (ref 38–126)
Anion gap: 10 (ref 5–15)
BUN: 19 mg/dL (ref 8–23)
CO2: 26 mmol/L (ref 22–32)
Calcium: 9.2 mg/dL (ref 8.9–10.3)
Chloride: 98 mmol/L (ref 98–111)
Creatinine, Ser: 1.4 mg/dL — ABNORMAL HIGH (ref 0.44–1.00)
GFR, Estimated: 38 mL/min — ABNORMAL LOW (ref >60)
Glucose, Bld: 131 mg/dL — ABNORMAL HIGH (ref 70–99)
Potassium: 4.1 mmol/L (ref 3.5–5.1)
Sodium: 134 mmol/L — ABNORMAL LOW (ref 135–145)
Total Bilirubin: 0.8 mg/dL (ref 0.3–1.2)
Total Protein: 8.3 g/dL — ABNORMAL HIGH (ref 6.5–8.1)

## 2021-09-28 MED ORDER — FULVESTRANT 250 MG/5ML IM SOLN
500.0000 mg | Freq: Once | INTRAMUSCULAR | Status: AC
  Administered 2021-09-28: 500 mg via INTRAMUSCULAR
  Filled 2021-09-28: qty 10

## 2021-09-28 NOTE — Assessment & Plan Note (Addendum)
#  Left breast ipsilateral chest wall recurrence stage IV [2015]; currently on Faslodex plus abema.CT June 14th, 2022-no evidence of metastatic disease noted;-STABLE; however tumor marker slightly rising.  Would recommend repeating imaging prior to next visit.  # Continue Faslodex today; continue Verzenio at 50 mg twice daily.  Tolerating well.   # Bilateral hip pain- arthritis-[s/p ortho evaluation]; on meloxicam.stable   # Mild anemia hemoglobin- 11-12/ CKD-;-STABLE;  Stool test 1/3 positive. Declines GI evaluation.   # CKD- stage III- GFR 48- -stable  # Anxiety/depression- celexa- STABLE/   # DISPOSITION: # shot today # follow up in  4weeks- with MD/labs- cbc/cmp ca- 27-29; Faslodex;CT CAP--Dr.B

## 2021-09-28 NOTE — Progress Notes (Signed)
[place] OFFICE PROGRESS NOTE  Patient Care Team: [name], MD as PCP - General (Internal Medicine) Bary Castilla, Forest Gleason, MD (General Surgery) Marden Noble, MD (Internal Medicine) [name], MD as Consulting Physician (Internal Medicine)   SUMMARY OF ONCOLOGIC HISTORY:  Oncology History Overview Note  # 2007- LEFT BREAST CA STAGE II [T2N27m; s/p mastec; Dr.Byrnett] ER-Pos/PR Neg; Her- 2 Neu- NEG; ACx4-Taxol x12;Femara; stopped May 2009-stopped follow up.  #JAN 2015- Post mastectomy Chest wall recurrence [chest wall mass bx-]; ER- 90%; PR- NEG; Her2 Neu- NEG; s/p excision [involving skeletal muscle/adipose tissue; clear margins]; CT July 2017- NED;   # Jan 2020- progression/ hilar- START abema [jan 14th]+ faslodex[jan 13th]  # DEC 2021- anemia/? CKD [stool ]  # OSTEOPENIA [BMD- June 2016]  # Kidney cysts  DIAGNOSIS: LEFT BREAST CA  STAGE: IV- NED  ;GOALS: pallaitive  CURRENT/MOST RECENT THERAPY: faslodex+ Abema    Breast cancer metastasized to skin ([facility]  02/05/2014 Initial Diagnosis   [facility]   Malignant neoplasm of overlapping sites of left breast in female, estrogen receptor positive ([facility]  07/24/2016 Initial Diagnosis   Cancer of overlapping sites of left female breast ([facility]   01/06/2019 -  Chemotherapy   The patient had fulvestrant (FASLODEX) injection 500 mg, 500 mg, Intramuscular,  Once, 1 of 4 cycles Dose modification: 500 mg (original dose 500 mg, Cycle 1) Administration: 500 mg (08/05/2020)   for chemotherapy treatment.      INTERVAL HISTORY: Accompanied by daughter.  Ambulating independently.  A very pleasant 80year old African-American female patient with above history of ER/PR positive breast cancer stage IV currently on Faslodex + Verzenio is here for follow-up.  Denies any worsening shortness of breath or cough.  No fever no chills.  Chronic mild joint pains not any worse.  No  diarrhea.  She continues to be compliant with her oral medicatio  Review of Systems  Constitutional:  Positive for malaise/fatigue. Negative for chills, diaphoresis, fever and weight loss.  HENT:  Negative for nosebleeds and sore throat.   Eyes:  Negative for double vision.  Respiratory:  Negative for cough, hemoptysis, sputum production, shortness of breath and wheezing.   Cardiovascular:  Negative for chest pain, palpitations, orthopnea and leg swelling.  Gastrointestinal:  Negative for abdominal pain, blood in stool, constipation, diarrhea, heartburn, melena, nausea and vomiting.  Musculoskeletal:  Positive for back pain and joint pain.  Skin: Negative.  Negative for itching and rash.  Neurological:  Negative for tingling, focal weakness, weakness and headaches.  Endo/Heme/Allergies:  Does not bruise/bleed easily.  Psychiatric/Behavioral:  Negative for depression. The patient is not nervous/anxious and does not have insomnia.   PAST MEDICAL HISTORY :  Past Medical History:  Diagnosis Date   [place]   Arthritis    Cancer ([place]   diagnosed 01/07 left breast with simple mastectomu & sn bx. The pt had a 3.5cm tumor. Frozen section report on the 2 sn were negative. On permanent sections a 0.561mmicrometastasis was identified. She was not felt to require a complete axillary dissection. She has completed her Tamoxifen therapy and has been released from routine f/u with medical oncology service.   Diabetes mellitus without complication ([facility]   Hyperlipidemia    Hypertension    since age 80 [facility]   Mastectomy site recurrence resected 01/26/2014, ER 90%, PR 0%, HER-2/neu nonamplified.  Other benign neoplasm of connective and other soft tissue of trunk, unspecified 2014   Personal history of colonic polyps    Personal history of tobacco use, presenting hazards to health    Special screening for  malignant neoplasms, colon    Unspecified disorder of skin and subcutaneous tissue 02/19/2013   biopsy of skin over the sternum on the right breast showed hypertrophic scar,keloid formation.    PAST SURGICAL HISTORY :   Past Surgical History:  Procedure Laterality Date   ABDOMINAL HYSTERECTOMY  2004   total   BREAST BIOPSY Right January 26, 2014   Core biopsy for mild increase breast uptake on PET scan, usual ductal hyperplasia and stromal fibrosis   BREAST SURGERY Left 2007   mastectomy   chest wall mass  01/26/14   CHOLECYSTECTOMY  2007   COLONOSCOPY W/ POLYPECTOMY  2011   Dr. Brynda Greathouse   EYE SURGERY Right    cataract surgery   head surgery  1991   MASTECTOMY Left 2007   PORT-A-CATH REMOVAL  2008   PORTACATH PLACEMENT  2007    FAMILY HISTORY :   Family History  Problem Relation Age of Onset   Cancer Other        ovarian,breast,colon cancers;relations not listed   Breast cancer Neg Hx     SOCIAL HISTORY:   Social History   Tobacco Use   Smoking status: Some Days    Packs/day: 1.00    Years: 50.00    Pack years: 50.00    Types: Cigarettes   Smokeless tobacco: Never  Substance Use Topics   Alcohol use: No    Alcohol/week: 0.0 standard drinks   Drug use: No    ALLERGIES:  is allergic to metoprolol.  MEDICATIONS:  Current Outpatient Medications  Medication Sig Dispense Refill   abemaciclib (VERZENIO) 50 MG tablet TAKE 1 TABLET BY MOUTH TWO TIMES DAILY 56 tablet 3   amLODipine (NORVASC) 10 MG tablet TAKE 1 TABLET(10 MG) BY MOUTH DAILY 90 tablet 1   atorvastatin (LIPITOR) 20 MG tablet Take 20 mg by mouth daily at 6 PM.      Calcium Carbonate-Vitamin D 600-200 MG-UNIT CAPS Take 200 capsules by mouth once.     citalopram (CELEXA) 40 MG tablet Take 1 tablet (40 mg total) by mouth daily. 90 tablet 1   lisinopril (ZESTRIL) 2.5 MG tablet Take 1 tablet (2.5 mg total) by mouth daily. 90 tablet 1   meloxicam (MOBIC) 15 MG tablet      No current facility-administered  medications for this visit.    PHYSICAL EXAMINATION: ECOG PERFORMANCE STATUS: 0 - Asymptomatic  BP (!) 144/75   Pulse 79   Temp 97.6 F (36.4 C) (Tympanic)   Resp 18   Ht 5' 6"  (1.676 m)   Wt 156 lb (70.8 kg)   BMI 25.18 kg/m   Filed Weights   09/28/21 1422  Weight: 156 lb (70.8 kg)    Physical Exam Constitutional:      Comments:   She is walking by self.  HENT:     Head: Normocephalic and atraumatic.     Mouth/Throat:     Pharynx: No oropharyngeal exudate.  Eyes:     Pupils: Pupils are equal, round, and reactive to light.  Cardiovascular:     Rate and Rhythm: Normal rate and regular rhythm.  Pulmonary:     Effort: No respiratory distress.     Breath sounds: No wheezing.  Abdominal:     General: Bowel sounds are  normal. There is no distension.     Palpations: Abdomen is soft. There is no mass.     Tenderness: There is no abdominal tenderness. There is no guarding or rebound.  Musculoskeletal:        General: No tenderness. Normal range of motion.     Cervical back: Normal range of motion and neck supple.  Skin:    General: Skin is warm.  Neurological:     Mental Status: She is alert and oriented to person, place, and time.  Psychiatric:        Mood and Affect: Affect normal.   LABORATORY DATA:  I have reviewed the data as listed    Component Value Date/Time   NA 134 (L) 09/28/2021 1410   NA 139 05/30/2014 0127   K 4.1 09/28/2021 1410   K 3.7 05/30/2014 0127   CL 98 09/28/2021 1410   CL 106 05/30/2014 0127   CO2 26 09/28/2021 1410   CO2 25 05/30/2014 0127   GLUCOSE 131 (H) 09/28/2021 1410   GLUCOSE 136 (H) 05/30/2014 0127   BUN 19 09/28/2021 1410   BUN 19 (H) 05/30/2014 0127   CREATININE 1.40 (H) 09/28/2021 1410   CREATININE 0.98 05/30/2014 0127   CALCIUM 9.2 09/28/2021 1410   CALCIUM 9.0 05/30/2014 0127   PROT 8.3 (H) 09/28/2021 1410   PROT 7.8 05/08/2014 1131   ALBUMIN 4.0 09/28/2021 1410   ALBUMIN 3.7 05/08/2014 1131   AST 26 09/28/2021 1410    AST 20 05/08/2014 1131   ALT 14 09/28/2021 1410   ALT 18 05/08/2014 1131   ALKPHOS 67 09/28/2021 1410   ALKPHOS 58 05/08/2014 1131   BILITOT 0.8 09/28/2021 1410   BILITOT 0.4 05/08/2014 1131   GFRNONAA 38 (L) 09/28/2021 1410   GFRNONAA 58 (L) 05/30/2014 0127   GFRAA 47 (L) 09/02/2020 0920   GFRAA >60 05/30/2014 0127    No results found for: SPEP, UPEP  Lab Results  Component Value Date   WBC 5.3 09/28/2021   NEUTROABS 2.1 09/28/2021   HGB 11.8 (L) 09/28/2021   HCT 37.0 09/28/2021   MCV 93.9 09/28/2021   PLT 231 09/28/2021      Chemistry      Component Value Date/Time   NA 134 (L) 09/28/2021 1410   NA 139 05/30/2014 0127   K 4.1 09/28/2021 1410   K 3.7 05/30/2014 0127   CL 98 09/28/2021 1410   CL 106 05/30/2014 0127   CO2 26 09/28/2021 1410   CO2 25 05/30/2014 0127   BUN 19 09/28/2021 1410   BUN 19 (H) 05/30/2014 0127   CREATININE 1.40 (H) 09/28/2021 1410   CREATININE 0.98 05/30/2014 0127      Component Value Date/Time   CALCIUM 9.2 09/28/2021 1410   CALCIUM 9.0 05/30/2014 0127   ALKPHOS 67 09/28/2021 1410   ALKPHOS 58 05/08/2014 1131   AST 26 09/28/2021 1410   AST 20 05/08/2014 1131   ALT 14 09/28/2021 1410   ALT 18 05/08/2014 1131   BILITOT 0.8 09/28/2021 1410   BILITOT 0.4 05/08/2014 1131         RADIOGRAPHIC STUDIES: I have personally reviewed the radiological images as listed and agreed with the findings in the report. No results found.   ASSESSMENT & PLAN:   Malignant neoplasm of overlapping sites of left breast in female, estrogen receptor positive ([place]) #Left breast ipsilateral chest wall recurrence stage IV [2015]; currently on Faslodex plus abema.CT June 14th, 2022-no evidence of metastatic disease noted;-STABLE;  however tumor marker slightly rising.  Would recommend repeating imaging prior to next visit.  # Continue Faslodex today; continue Verzenio at 50 mg twice daily.  Tolerating well.   # Bilateral hip pain- arthritis-[s/p ortho  evaluation]; on meloxicam.stable   # Mild anemia hemoglobin- 11-12/ CKD-;-STABLE;  Stool test 1/3 positive. Declines GI evaluation.   # CKD- stage III- GFR 48- -stable  # Anxiety/depression- celexa- STABLE/   # DISPOSITION: # shot today # follow up in  4weeks- with MD/labs- cbc/cmp ca- 27-29; Faslodex;CT CAP--Dr.B      [name], MD 09/28/2021 3:17 PM

## 2021-09-30 ENCOUNTER — Encounter: Payer: Self-pay | Admitting: Internal Medicine

## 2021-09-30 LAB — CANCER ANTIGEN 27.29: CA 27.29: 75.1 U/mL — ABNORMAL HIGH (ref 0.0–38.6)

## 2021-10-05 ENCOUNTER — Other Ambulatory Visit (HOSPITAL_COMMUNITY): Payer: Self-pay

## 2021-10-06 ENCOUNTER — Other Ambulatory Visit (HOSPITAL_COMMUNITY): Payer: Self-pay

## 2021-10-18 ENCOUNTER — Ambulatory Visit
Admission: RE | Admit: 2021-10-18 | Discharge: 2021-10-18 | Disposition: A | Discharge status: Home / Self Care | Payer: Medicare HMO | Source: Ambulatory Visit | Attending: Internal Medicine | Admitting: Internal Medicine

## 2021-10-18 ENCOUNTER — Other Ambulatory Visit: Payer: Self-pay

## 2021-10-18 DIAGNOSIS — C50812 Malignant neoplasm of overlapping sites of left female breast: Secondary | ICD-10-CM | POA: Diagnosis not present

## 2021-10-18 DIAGNOSIS — J439 Emphysema, unspecified: Secondary | ICD-10-CM | POA: Diagnosis not present

## 2021-10-18 DIAGNOSIS — Z170 Estrogen receptor positive status [ER+]: Secondary | ICD-10-CM | POA: Insufficient documentation

## 2021-10-18 DIAGNOSIS — N281 Cyst of kidney, acquired: Secondary | ICD-10-CM | POA: Diagnosis not present

## 2021-10-18 DIAGNOSIS — I2510 Atherosclerotic heart disease of native coronary artery without angina pectoris: Secondary | ICD-10-CM | POA: Diagnosis not present

## 2021-10-18 DIAGNOSIS — I700 Atherosclerosis of aorta: Secondary | ICD-10-CM | POA: Diagnosis not present

## 2021-10-18 DIAGNOSIS — C50919 Malignant neoplasm of unspecified site of unspecified female breast: Secondary | ICD-10-CM | POA: Diagnosis not present

## 2021-10-18 MED ORDER — IOHEXOL 300 MG/ML  SOLN
75.0000 mL | Freq: Once | INTRAMUSCULAR | Status: AC | PRN
  Administered 2021-10-18: 75 mL via INTRAVENOUS

## 2021-10-26 ENCOUNTER — Other Ambulatory Visit (HOSPITAL_COMMUNITY): Payer: Self-pay

## 2021-10-27 ENCOUNTER — Inpatient Hospital Stay: Payer: Medicare HMO

## 2021-10-27 ENCOUNTER — Inpatient Hospital Stay (HOSPITAL_BASED_OUTPATIENT_CLINIC_OR_DEPARTMENT_OTHER): Payer: Medicare HMO | Admitting: Internal Medicine

## 2021-10-27 ENCOUNTER — Telehealth: Payer: Self-pay | Admitting: Internal Medicine

## 2021-10-27 ENCOUNTER — Inpatient Hospital Stay: Payer: Medicare HMO | Attending: Internal Medicine

## 2021-10-27 ENCOUNTER — Other Ambulatory Visit: Payer: Self-pay

## 2021-10-27 ENCOUNTER — Encounter: Payer: Self-pay | Admitting: Internal Medicine

## 2021-10-27 DIAGNOSIS — Z170 Estrogen receptor positive status [ER+]: Secondary | ICD-10-CM | POA: Diagnosis not present

## 2021-10-27 DIAGNOSIS — Z5111 Encounter for antineoplastic chemotherapy: Secondary | ICD-10-CM | POA: Diagnosis not present

## 2021-10-27 DIAGNOSIS — C50812 Malignant neoplasm of overlapping sites of left female breast: Secondary | ICD-10-CM

## 2021-10-27 DIAGNOSIS — C792 Secondary malignant neoplasm of skin: Secondary | ICD-10-CM | POA: Insufficient documentation

## 2021-10-27 DIAGNOSIS — F17210 Nicotine dependence, cigarettes, uncomplicated: Secondary | ICD-10-CM | POA: Diagnosis not present

## 2021-10-27 DIAGNOSIS — C7989 Secondary malignant neoplasm of other specified sites: Secondary | ICD-10-CM | POA: Diagnosis not present

## 2021-10-27 LAB — CBC WITH DIFFERENTIAL/PLATELET
Abs Immature Granulocytes: 0.01 10*3/uL (ref 0.00–0.07)
Basophils Absolute: 0.1 10*3/uL (ref 0.0–0.1)
Basophils Relative: 1 %
Eosinophils Absolute: 0.1 10*3/uL (ref 0.0–0.5)
Eosinophils Relative: 2 %
HCT: 37.7 % (ref 36.0–46.0)
Hemoglobin: 11.8 g/dL — ABNORMAL LOW (ref 12.0–15.0)
Immature Granulocytes: 0 %
Lymphocytes Relative: 37 %
Lymphs Abs: 2.3 10*3/uL (ref 0.7–4.0)
MCH: 29.4 pg (ref 26.0–34.0)
MCHC: 31.3 g/dL (ref 30.0–36.0)
MCV: 94 fL (ref 80.0–100.0)
Monocytes Absolute: 0.5 10*3/uL (ref 0.1–1.0)
Monocytes Relative: 9 %
Neutro Abs: 3.1 10*3/uL (ref 1.7–7.7)
Neutrophils Relative %: 51 %
Platelets: 227 10*3/uL (ref 150–400)
RBC: 4.01 MIL/uL (ref 3.87–5.11)
RDW: 13.4 % (ref 11.5–15.5)
WBC: 6.1 10*3/uL (ref 4.0–10.5)
nRBC: 0 % (ref 0.0–0.2)

## 2021-10-27 LAB — COMPREHENSIVE METABOLIC PANEL
ALT: 14 U/L (ref 0–44)
AST: 29 U/L (ref 15–41)
Albumin: 4.2 g/dL (ref 3.5–5.0)
Alkaline Phosphatase: 68 U/L (ref 38–126)
Anion gap: 10 (ref 5–15)
BUN: 15 mg/dL (ref 8–23)
CO2: 28 mmol/L (ref 22–32)
Calcium: 9.4 mg/dL (ref 8.9–10.3)
Chloride: 101 mmol/L (ref 98–111)
Creatinine, Ser: 1.07 mg/dL — ABNORMAL HIGH (ref 0.44–1.00)
GFR, Estimated: 53 mL/min — ABNORMAL LOW (ref >60)
Glucose, Bld: 130 mg/dL — ABNORMAL HIGH (ref 70–99)
Potassium: 4.5 mmol/L (ref 3.5–5.1)
Sodium: 139 mmol/L (ref 135–145)
Total Bilirubin: 0.7 mg/dL (ref 0.3–1.2)
Total Protein: 8.2 g/dL — ABNORMAL HIGH (ref 6.5–8.1)

## 2021-10-27 MED ORDER — FULVESTRANT 250 MG/5ML IM SOSY
500.0000 mg | PREFILLED_SYRINGE | Freq: Once | INTRAMUSCULAR | Status: AC
  Administered 2021-10-27: 500 mg via INTRAMUSCULAR
  Filled 2021-10-27: qty 10

## 2021-10-27 NOTE — Progress Notes (Signed)
[facility] OFFICE PROGRESS NOTE  Patient Care Team: [name], MD as PCP - General (Internal Medicine) Bary Castilla, Forest Gleason, MD (General Surgery) Marden Noble, MD (Internal Medicine) [name], MD as Consulting Physician (Internal Medicine)   SUMMARY OF ONCOLOGIC HISTORY:  Oncology History Overview Note  # 2007- LEFT BREAST CA STAGE II [T2N28m; s/p mastec; Dr.Byrnett] ER-Pos/PR Neg; Her- 2 Neu- NEG; ACx4-Taxol x12;Femara; stopped May 2009-stopped follow up.  #JAN 2015- Post mastectomy Chest wall recurrence [chest wall mass bx-]; ER- 90%; PR- NEG; Her2 Neu- NEG; s/p excision [involving skeletal muscle/adipose tissue; clear margins]; CT July 2017- NED;   # Jan 2020- progression/ hilar- START abema [jan 14th]+ faslodex[jan 13th]  # DEC 2021- anemia/? CKD [stool ]  # OSTEOPENIA [BMD- June 2016]  # Kidney cysts  DIAGNOSIS: LEFT BREAST CA  STAGE: IV- NED  ;GOALS: pallaitive  CURRENT/MOST RECENT THERAPY: faslodex+ Abema    [place] to skin [place]  02/05/2014 Initial Diagnosis   Breast cancer metastasized to skin ([facility]   Malignant neoplasm of overlapping sites of left breast in female, estrogen receptor positive ([facility]  07/24/2016 Initial Diagnosis   [place] of left [place] breast [place]   01/06/2019 -  Chemotherapy   The patient had fulvestrant (FASLODEX) injection 500 mg, 500 mg, Intramuscular,  Once, 1 of 4 cycles Dose modification: 500 mg (original dose 500 mg, Cycle 1) Administration: 500 mg (08/05/2020)   for chemotherapy treatment.      INTERVAL HISTORY: Accompanied by sister, robin.  Ambulating independently.  A very pleasant 80 year old African-American female patient with above history of ER/PR positive breast cancer stage IV currently on Faslodex + Verzenio is here for follow-up/review results of the CT scan chest and pelvis.  Patient complains of worsening fatigue.  Possible weight loss.  Denies any worsening shortness of breath or cough.  No fever no chills.  Chronic mild joint pains not any worse.  No diarrhea.  She continues to be compliant with her oral medicatio  Review of Systems  Constitutional:  Positive for malaise/fatigue. Negative for chills, diaphoresis, fever and weight loss.  HENT:  Negative for nosebleeds and sore throat.   Eyes:  Negative for double vision.  Respiratory:  Negative for cough, hemoptysis, sputum production, shortness of breath and wheezing.   Cardiovascular:  Negative for chest pain, palpitations, orthopnea and leg swelling.  Gastrointestinal:  Negative for abdominal pain, blood in stool, constipation, diarrhea, heartburn, melena, nausea and vomiting.  Musculoskeletal:  Positive for back pain and joint pain.  Skin: Negative.  Negative for itching and rash.  Neurological:  Negative for tingling, focal weakness, weakness and headaches.  Endo/Heme/Allergies:  Does not bruise/bleed easily.  Psychiatric/Behavioral:  Negative for depression. The patient is not nervous/anxious and does not have insomnia.   PAST MEDICAL HISTORY :  Past Medical History:  Diagnosis Date   Aneurysm ([facility] 2007   Arthritis    Cancer ([facility] 2007   diagnosed 01/07 left breast with simple mastectomu & sn bx. The pt had a 3.5cm tumor. Frozen section report on the 2 sn were negative. On permanent sections a 0.59mmicrometastasis was identified. She was not felt to require a complete axillary dissection. She has completed her Tamoxifen therapy and has been released from routine f/u with medical oncology service.   Diabetes mellitus without complication ([facility]   Hyperlipidemia    Hypertension    since age 89 Malignant neoplasm of upper-outer quadrant of female breast (  [place]) January 2015   Mastectomy site recurrence resected 01/26/2014, ER 90%, PR 0%, HER-2/neu nonamplified.   Other benign neoplasm of connective and other soft tissue of trunk, unspecified 2014   Personal history  of colonic polyps    Personal history of tobacco use, presenting hazards to health    Special screening for malignant neoplasms, colon    Unspecified disorder of skin and subcutaneous tissue 02/19/2013   biopsy of skin over the sternum on the right breast showed hypertrophic scar,keloid formation.    PAST SURGICAL HISTORY :   Past Surgical History:  Procedure Laterality Date   ABDOMINAL HYSTERECTOMY  2004   total   BREAST BIOPSY Right January 26, 2014   Core biopsy for mild increase breast uptake on PET scan, usual ductal hyperplasia and stromal fibrosis   BREAST SURGERY Left 2007   mastectomy   chest wall mass  01/26/14   CHOLECYSTECTOMY  2007   COLONOSCOPY W/ POLYPECTOMY  2011   Dr. Brynda Greathouse   EYE SURGERY Right    cataract surgery   head surgery  1991   MASTECTOMY Left 2007   PORT-A-CATH REMOVAL  2008   PORTACATH PLACEMENT  2007    FAMILY HISTORY :   Family History  Problem Relation Age of Onset   Cancer Other        ovarian,breast,colon cancers;relations not listed   Breast cancer Neg Hx     SOCIAL HISTORY:   Social History   Tobacco Use   Smoking status: Some Days    Packs/day: 1.00    Years: 50.00    Pack years: 50.00    Types: Cigarettes   Smokeless tobacco: Never  Substance Use Topics   Alcohol use: No    Alcohol/week: 0.0 standard drinks   Drug use: No    ALLERGIES:  is allergic to metoprolol.  MEDICATIONS:  Current Outpatient Medications  Medication Sig Dispense Refill   abemaciclib (VERZENIO) 50 MG tablet TAKE 1 TABLET BY MOUTH TWO TIMES DAILY 56 tablet 3   amLODipine (NORVASC) 10 MG tablet TAKE 1 TABLET(10 MG) BY MOUTH DAILY 90 tablet 1   atorvastatin (LIPITOR) 20 MG tablet Take 20 mg by mouth daily at 6 PM.      Calcium Carbonate-Vitamin D 600-200 MG-UNIT CAPS Take 200 capsules by mouth once.     citalopram (CELEXA) 40 MG tablet Take 1 tablet (40 mg total) by mouth daily. 90 tablet 1   lisinopril (ZESTRIL) 2.5 MG tablet Take 1 tablet (2.5 mg total)  by mouth daily. 90 tablet 1   meloxicam (MOBIC) 15 MG tablet      No current facility-administered medications for this visit.   Facility-Administered Medications Ordered in Other Visits  Medication Dose Route Frequency Provider Last Rate Last Admin   fulvestrant (FASLODEX) injection 500 mg  500 mg Intramuscular Once [name], MD        PHYSICAL EXAMINATION: ECOG PERFORMANCE STATUS: 0 - Asymptomatic  BP (!) 158/85   Pulse 83   Temp 97.7 F (36.5 C)   Resp 16   Wt 149 lb (67.6 kg)   BMI 24.05 kg/m   Filed Weights   10/27/21 1002  Weight: 149 lb (67.6 kg)    Physical Exam HENT:     Head: Normocephalic and atraumatic.     Mouth/Throat:     Pharynx: No oropharyngeal exudate.  Eyes:     Pupils: Pupils are equal, round, and reactive to light.  Cardiovascular:     Rate and Rhythm:  Normal rate and regular rhythm.  Pulmonary:     Effort: No respiratory distress.     Breath sounds: No wheezing.  Abdominal:     General: Bowel sounds are normal. There is no distension.     Palpations: Abdomen is soft. There is no mass.     Tenderness: There is no abdominal tenderness. There is no guarding or rebound.  Musculoskeletal:        General: No tenderness. Normal range of motion.     Cervical back: Normal range of motion and neck supple.  Skin:    General: Skin is warm.  Neurological:     Mental Status: She is alert and oriented to person, place, and time.  Psychiatric:        Mood and Affect: Affect normal.   LABORATORY DATA:  I have reviewed the data as listed    Component Value Date/Time   NA 139 10/27/2021 0945   NA 139 05/30/2014 0127   K 4.5 10/27/2021 0945   K 3.7 05/30/2014 0127   CL 101 10/27/2021 0945   CL 106 05/30/2014 0127   CO2 28 10/27/2021 0945   CO2 25 05/30/2014 0127   GLUCOSE 130 (H) 10/27/2021 0945   GLUCOSE 136 (H) 05/30/2014 0127   BUN 15 10/27/2021 0945   BUN 19 (H) 05/30/2014 0127   CREATININE 1.07 (H) 10/27/2021 0945   CREATININE  0.98 05/30/2014 0127   CALCIUM 9.4 10/27/2021 0945   CALCIUM 9.0 05/30/2014 0127   PROT 8.2 (H) 10/27/2021 0945   PROT 7.8 05/08/2014 1131   ALBUMIN 4.2 10/27/2021 0945   ALBUMIN 3.7 05/08/2014 1131   AST 29 10/27/2021 0945   AST 20 05/08/2014 1131   ALT 14 10/27/2021 0945   ALT 18 05/08/2014 1131   ALKPHOS 68 10/27/2021 0945   ALKPHOS 58 05/08/2014 1131   BILITOT 0.7 10/27/2021 0945   BILITOT 0.4 05/08/2014 1131   GFRNONAA 53 (L) 10/27/2021 0945   GFRNONAA 58 (L) 05/30/2014 0127   GFRAA 47 (L) 09/02/2020 0920   GFRAA >60 05/30/2014 0127    No results found for: SPEP, UPEP  Lab Results  Component Value Date   WBC 6.1 10/27/2021   NEUTROABS 3.1 10/27/2021   HGB 11.8 (L) 10/27/2021   HCT 37.7 10/27/2021   MCV 94.0 10/27/2021   PLT 227 10/27/2021      Chemistry      Component Value Date/Time   NA 139 10/27/2021 0945   NA 139 05/30/2014 0127   K 4.5 10/27/2021 0945   K 3.7 05/30/2014 0127   CL 101 10/27/2021 0945   CL 106 05/30/2014 0127   CO2 28 10/27/2021 0945   CO2 25 05/30/2014 0127   BUN 15 10/27/2021 0945   BUN 19 (H) 05/30/2014 0127   CREATININE 1.07 (H) 10/27/2021 0945   CREATININE 0.98 05/30/2014 0127      Component Value Date/Time   CALCIUM 9.4 10/27/2021 0945   CALCIUM 9.0 05/30/2014 0127   ALKPHOS 68 10/27/2021 0945   ALKPHOS 58 05/08/2014 1131   AST 29 10/27/2021 0945   AST 20 05/08/2014 1131   ALT 14 10/27/2021 0945   ALT 18 05/08/2014 1131   BILITOT 0.7 10/27/2021 0945   BILITOT 0.4 05/08/2014 1131         RADIOGRAPHIC STUDIES: I have personally reviewed the radiological images as listed and agreed with the findings in the report. No results found.   ASSESSMENT & PLAN:   Malignant neoplasm of overlapping sites  of left breast in female, estrogen receptor positive ([place]) #Left breast ipsilateral chest wall recurrence stage IV [2015]; currently on Faslodex plus abema. OCT 26th, 2022- CT  New mixed lytic and sclerotic bone lesions  involving the thoracic and lumbar spine worrisome for new metastatic bone disease. Suspect bilateral fifth rib lesions also.  Enlarging right-sided pleural/subpleural nodules worrisome for metastatic disease; Possible new liver lesion and segment 4A;    #I reviewed my significant concerns with patient and her sister Shirlean Mylar regarding concerns for progressive disease. Recommend PET-CT for further evaluation.  Tried to reach Teresa-unable to reach.  We will try to call her later in the afternoon.  # Continue Faslodex today; continue Verzenio at 50 mg twice daily.  Tolerating well.   # Bilateral hip pain- arthritis-[s/p ortho evaluation]; on meloxicam. STABLE.   # Mild anemia hemoglobin- 11-12/ CKD-;-STABLE;    # CKD- stage III- GFR 48- -stable  # Anxiety/depression- celexa- STABLE/   # DISPOSITION: # shot today # PET scan ASAP # follow up 1-2 days  with MD after the PET scan; no labs; Dr.B      [name], MD 10/27/2021 10:51 AM

## 2021-10-27 NOTE — Telephone Encounter (Signed)
I spoke to patient's daughter, regarding the CT scan concerning for progression of disease.  Await PET scan for further evaluation.  Will discuss treatment options after the PET scan to evaluate.  Daughter appreciated the phone call.

## 2021-10-27 NOTE — Assessment & Plan Note (Addendum)
#  Left breast ipsilateral chest wall recurrence stage IV [2015]; currently on Faslodex plus abema. OCT 26th, 2022- CT  New mixed lytic and sclerotic bone lesions involving the thoracic and lumbar spine worrisome for new metastatic bone disease. Suspect bilateral fifth rib lesions also.  Enlarging right-sided pleural/subpleural nodules worrisome for metastatic disease; Possible new liver lesion and segment 4A;    #I reviewed my significant concerns with patient and her sister Jessica Compton regarding concerns for progressive disease. Recommend PET-CT for further evaluation.  Tried to reach Teresa-unable to reach.  We will try to call her later in the afternoon.  # Continue Faslodex today; continue Verzenio at 50 mg twice daily.  Tolerating well.   # Bilateral hip pain- arthritis-[s/p ortho evaluation]; on meloxicam. STABLE.   # Mild anemia hemoglobin- 11-12/ CKD-;-STABLE;    # CKD- stage III- GFR 48- -stable  # Anxiety/depression- celexa- STABLE/   # DISPOSITION: # shot today # PET scan ASAP # follow up 1-2 days  with MD after the PET scan; no labs; Dr.B

## 2021-10-27 NOTE — Progress Notes (Signed)
Patient denies new problems/concerns today.  Reports a good appetite, has a 7 lb weight loss since last documented weight.

## 2021-10-28 ENCOUNTER — Other Ambulatory Visit (HOSPITAL_COMMUNITY): Payer: Self-pay

## 2021-10-28 LAB — CANCER ANTIGEN 27.29: CA 27.29: 93.3 U/mL — ABNORMAL HIGH (ref 0.0–38.6)

## 2021-10-31 ENCOUNTER — Other Ambulatory Visit: Payer: Self-pay

## 2021-10-31 ENCOUNTER — Ambulatory Visit
Admission: RE | Admit: 2021-10-31 | Discharge: 2021-10-31 | Disposition: A | Discharge status: Home / Self Care | Payer: Medicare HMO | Source: Ambulatory Visit | Attending: Internal Medicine | Admitting: Internal Medicine

## 2021-10-31 DIAGNOSIS — C50912 Malignant neoplasm of unspecified site of left female breast: Secondary | ICD-10-CM | POA: Diagnosis not present

## 2021-10-31 DIAGNOSIS — K769 Liver disease, unspecified: Secondary | ICD-10-CM | POA: Diagnosis not present

## 2021-10-31 DIAGNOSIS — C50812 Malignant neoplasm of overlapping sites of left female breast: Secondary | ICD-10-CM | POA: Insufficient documentation

## 2021-10-31 DIAGNOSIS — C7951 Secondary malignant neoplasm of bone: Secondary | ICD-10-CM | POA: Diagnosis not present

## 2021-10-31 DIAGNOSIS — R918 Other nonspecific abnormal finding of lung field: Secondary | ICD-10-CM | POA: Diagnosis not present

## 2021-10-31 DIAGNOSIS — Z170 Estrogen receptor positive status [ER+]: Secondary | ICD-10-CM | POA: Diagnosis not present

## 2021-10-31 LAB — GLUCOSE, CAPILLARY: Glucose-Capillary: 125 mg/dL — ABNORMAL HIGH (ref 70–99)

## 2021-10-31 MED ORDER — FLUDEOXYGLUCOSE F - 18 (FDG) INJECTION
7.7000 | Freq: Once | INTRAVENOUS | Status: AC | PRN
  Administered 2021-10-31: 8.3 via INTRAVENOUS

## 2021-11-02 ENCOUNTER — Other Ambulatory Visit: Payer: Self-pay

## 2021-11-02 ENCOUNTER — Inpatient Hospital Stay (HOSPITAL_BASED_OUTPATIENT_CLINIC_OR_DEPARTMENT_OTHER): Payer: Medicare HMO | Admitting: Internal Medicine

## 2021-11-02 VITALS — BP 153/80 | HR 77 | Temp 99.5°F | Resp 18

## 2021-11-02 DIAGNOSIS — R948 Abnormal results of function studies of other organs and systems: Secondary | ICD-10-CM

## 2021-11-02 DIAGNOSIS — C7989 Secondary malignant neoplasm of other specified sites: Secondary | ICD-10-CM | POA: Diagnosis not present

## 2021-11-02 DIAGNOSIS — Z170 Estrogen receptor positive status [ER+]: Secondary | ICD-10-CM | POA: Diagnosis not present

## 2021-11-02 DIAGNOSIS — C50812 Malignant neoplasm of overlapping sites of left female breast: Secondary | ICD-10-CM | POA: Diagnosis not present

## 2021-11-02 DIAGNOSIS — Z5111 Encounter for antineoplastic chemotherapy: Secondary | ICD-10-CM | POA: Diagnosis not present

## 2021-11-02 DIAGNOSIS — C792 Secondary malignant neoplasm of skin: Secondary | ICD-10-CM | POA: Diagnosis not present

## 2021-11-02 DIAGNOSIS — F17210 Nicotine dependence, cigarettes, uncomplicated: Secondary | ICD-10-CM | POA: Diagnosis not present

## 2021-11-02 NOTE — Assessment & Plan Note (Addendum)
#  Left breast ipsilateral chest wall recurrence stage IV [2015]; NOV 7th PET scan- New hypermetabolic lesions within the spine and RIGHT iliac bone consistent with progression metastatic skeletal disease;  Hypermetabolic lesion the anterior margin of the LEFT hepatic lobe consistent with hepatic metastasis. Nodules in the RIGHT lower lung without clear hypermetabolic activity. Focal hypermetabolic activity in the ascending colon [see below].   #Had a long discussion with the patient and family regarding progressive disease noted on PET scan.  Recommend discontinuation of Faslodex+ abema.   #Recommend starting patient on Xeloda 1000 mg per metered square twice daily [2 weeks on 1 week off]. Discussed potential side effects including but not limited to- hand-foot syndrome and diarrhea oral ulcers.  Patient could get started on Xeloda the next 3 weeks or so. [week after [place]].  Understands treatments are palliative not curative.  Indefinite treatments.  #Ascending colon mass-incidentally noted on PET scan.  Discussed with Dr. Vicente Males; kindly agrees to arrange for colonoscopy in the next 2 weeks or so.  # Bilateral hip pain- arthritis-[s/p ortho evaluation]; on meloxicam. STABLE.   # Mild anemia hemoglobin- 11-12/ CKD-;-STABLE;    # CKD- stage III- GFR 48-50- STABLE  # Anxiety/depression- celexa- STABLE  # DISPOSITION:  # referral to [facility]- Dr.Anna re: colonoscopy # follow up 4 weeks; MD; cbc/cmp;CEA/ca27-29; Dr.B  # I reviewed the blood work- with the patient in detail; also reviewed the imaging independently [as summarized above]; and with the patient in detail.

## 2021-11-02 NOTE — Progress Notes (Signed)
[place] OFFICE PROGRESS NOTE  Patient Care Team: [name], MD as PCP - General (Internal Medicine) Bary Castilla, Forest Gleason, MD (General Surgery) Marden Noble, MD (Internal Medicine) [name], MD as Consulting Physician (Internal Medicine)   SUMMARY OF ONCOLOGIC HISTORY:  Oncology History Overview Note  # 2007- LEFT BREAST CA STAGE II [T2N59m; s/p mastec; Dr.Byrnett] ER-Pos/PR Neg; Her- 2 Neu- NEG; ACx4-Taxol x12; Femara; stopped May 2009-stopped follow up.  #JAN 2015- Post mastectomy Chest wall recurrence [chest wall mass bx-]; ER- 90%; PR- NEG; Her2 Neu- NEG; s/p excision [involving skeletal muscle/adipose tissue; clear margins]; CT July 2017- NED;   # Jan 2020- progression/ hilar- START abema [jan 14th]+ faslodex[jan 13th]; OCT-NOV 2022-PET scan shows progressive disease in the bones to liver.  Discontinue Abema+ Faslodex  # NOV 28th week- START Xeloda 2w/1w  # DEC 2021- anemia/? CKD [[email or web address]]  # OSTEOPENIA [BMD- June 2016]  # Kidney cysts  DIAGNOSIS: LEFT BREAST CA  STAGE: IV- NED  ;GOALS: pallaitive  CURRENT/MOST RECENT THERAPY:     [place]  02/05/2014 Initial Diagnosis   Breast cancer metastasized to skin ([facility]   Malignant neoplasm of overlapping sites of left breast in female, estrogen receptor positive ([facility]  07/24/2016 Initial Diagnosis   Cancer of overlapping sites of left female breast ([facility]   01/06/2019 -  Chemotherapy   The patient had fulvestrant (FASLODEX) injection 500 mg, 500 mg, Intramuscular,  Once, 1 of 4 cycles Dose modification: 500 mg (original dose 500 mg, Cycle 1) Administration: 500 mg (08/05/2020)   for chemotherapy treatment.      INTERVAL HISTORY: Accompanied by sister, robin.;and her daughter-Teresa.  Ambulating independently.  A very pleasant 80 year old African-American female patient with above history of ER/PR positive breast cancer stage IV currently on Faslodex  + Verzenio is here for follow-up/review results of the PET scan.  Continues to have worsening fatigue.  Positive for weight loss. Denies any worsening shortness of breath or cough.  No fever no chills.  Chronic mild joint pains not any worse.  No diarrhea.    Review of Systems  Constitutional:  Positive for malaise/fatigue. Negative for chills, diaphoresis, fever and weight loss.  HENT:  Negative for nosebleeds and sore throat.   Eyes:  Negative for double vision.  Respiratory:  Negative for cough, hemoptysis, sputum production, shortness of breath and wheezing.   Cardiovascular:  Negative for chest pain, palpitations, orthopnea and leg swelling.  Gastrointestinal:  Negative for abdominal pain, blood in stool, constipation, diarrhea, heartburn, melena, nausea and vomiting.  Musculoskeletal:  Positive for back pain and joint pain.  Skin: Negative.  Negative for itching and rash.  Neurological:  Negative for tingling, focal weakness, weakness and headaches.  Endo/Heme/Allergies:  Does not bruise/bleed easily.  Psychiatric/Behavioral:  Negative for depression. The patient is not nervous/anxious and does not have insomnia.   PAST MEDICAL HISTORY :  Past Medical History:  Diagnosis Date  . [place]  . Arthritis   . Cancer ([facility] 2007   diagnosed 01/07 left breast with simple mastectomu & sn bx. The pt had a 3.5cm tumor. Frozen section report on the 2 sn were negative. On permanent sections a 0.517mmicrometastasis was identified. She was not felt to require a complete axillary dissection. She has completed her Tamoxifen therapy and has been released from routine f/u with medical oncology service.  . Diabetes mellitus without complication ([facility]  . Hyperlipidemia   . Hypertension  since age 17  . Malignant neoplasm of upper-outer quadrant of female breast [facility]) January 2015   Mastectomy site recurrence resected 01/26/2014, ER 90%, PR 0%, HER-2/neu nonamplified.  . Other benign  neoplasm of connective and other soft tissue of trunk, unspecified 2014  . Personal history of colonic polyps   . Personal history of tobacco use, presenting hazards to health   . Special screening for malignant neoplasms, colon   . Unspecified disorder of skin and subcutaneous tissue 02/19/2013   biopsy of skin over the sternum on the right breast showed hypertrophic scar,keloid formation.    PAST SURGICAL HISTORY :   Past Surgical History:  Procedure Laterality Date  . ABDOMINAL HYSTERECTOMY  2004   total  . BREAST BIOPSY Right January 26, 2014   Core biopsy for mild increase breast uptake on PET scan, usual ductal hyperplasia and stromal fibrosis  . BREAST SURGERY Left 2007   mastectomy  . chest wall mass  01/26/14  . CHOLECYSTECTOMY  2007  . COLONOSCOPY W/ POLYPECTOMY  2011   Dr. Brynda Greathouse  . EYE SURGERY Right    cataract surgery  . head surgery  1991  . MASTECTOMY Left 2007  . PORT-A-CATH REMOVAL  2008  . PORTACATH PLACEMENT  2007    FAMILY HISTORY :   Family History  Problem Relation Age of Onset  . Cancer Other        ovarian,breast,colon cancers;relations not listed  . Breast cancer Neg Hx     SOCIAL HISTORY:   Social History   Tobacco Use  . Smoking status: Some Days    Packs/day: 1.00    Years: 50.00    Pack years: 50.00    Types: Cigarettes  . Smokeless tobacco: Never  Substance Use Topics  . Alcohol use: No    Alcohol/week: 0.0 standard drinks  . Drug use: No    ALLERGIES:  is allergic to metoprolol.  MEDICATIONS:  Current Outpatient Medications  Medication Sig Dispense Refill  . abemaciclib (VERZENIO) 50 MG tablet TAKE 1 TABLET BY MOUTH TWO TIMES DAILY 56 tablet 3  . amLODipine (NORVASC) 10 MG tablet TAKE 1 TABLET(10 MG) BY MOUTH DAILY 90 tablet 1  . Calcium Carbonate-Vitamin D 600-200 MG-UNIT CAPS Take 200 capsules by mouth once.    . citalopram (CELEXA) 40 MG tablet Take 1 tablet (40 mg total) by mouth daily. 90 tablet 1  . lisinopril (ZESTRIL)  2.5 MG tablet Take 1 tablet (2.5 mg total) by mouth daily. 90 tablet 1  . atorvastatin (LIPITOR) 20 MG tablet Take 20 mg by mouth daily at 6 PM.  (Patient not taking: Reported on 11/02/2021)    . meloxicam (MOBIC) 15 MG tablet  (Patient not taking: Reported on 11/02/2021)     No current facility-administered medications for this visit.    PHYSICAL EXAMINATION: ECOG PERFORMANCE STATUS: 0 - Asymptomatic  BP (!) 153/80   Pulse 77   Temp 99.5 F (37.5 C) (Tympanic)   Resp 18   SpO2 97%   There were no vitals filed for this visit.   Physical Exam HENT:     Head: Normocephalic and atraumatic.     Mouth/Throat:     Pharynx: No oropharyngeal exudate.  Eyes:     Pupils: Pupils are equal, round, and reactive to light.  Cardiovascular:     Rate and Rhythm: Normal rate and regular rhythm.  Pulmonary:     Effort: No respiratory distress.     Breath sounds: No wheezing.  Abdominal:  General: Bowel sounds are normal. There is no distension.     Palpations: Abdomen is soft. There is no mass.     Tenderness: There is no abdominal tenderness. There is no guarding or rebound.  Musculoskeletal:        General: No tenderness. Normal range of motion.     Cervical back: Normal range of motion and neck supple.  Skin:    General: Skin is warm.  Neurological:     Mental Status: She is alert and oriented to person, place, and time.  Psychiatric:        Mood and Affect: Affect normal.   LABORATORY DATA:  I have reviewed the data as listed    Component Value Date/Time   NA 139 10/27/2021 0945   NA 139 05/30/2014 0127   K 4.5 10/27/2021 0945   K 3.7 05/30/2014 0127   CL 101 10/27/2021 0945   CL 106 05/30/2014 0127   CO2 28 10/27/2021 0945   CO2 25 05/30/2014 0127   GLUCOSE 130 (H) 10/27/2021 0945   GLUCOSE 136 (H) 05/30/2014 0127   BUN 15 10/27/2021 0945   BUN 19 (H) 05/30/2014 0127   CREATININE 1.07 (H) 10/27/2021 0945   CREATININE 0.98 05/30/2014 0127   CALCIUM 9.4 10/27/2021  0945   CALCIUM 9.0 05/30/2014 0127   PROT 8.2 (H) 10/27/2021 0945   PROT 7.8 05/08/2014 1131   ALBUMIN 4.2 10/27/2021 0945   ALBUMIN 3.7 05/08/2014 1131   AST 29 10/27/2021 0945   AST 20 05/08/2014 1131   ALT 14 10/27/2021 0945   ALT 18 05/08/2014 1131   ALKPHOS 68 10/27/2021 0945   ALKPHOS 58 05/08/2014 1131   BILITOT 0.7 10/27/2021 0945   BILITOT 0.4 05/08/2014 1131   GFRNONAA 53 (L) 10/27/2021 0945   GFRNONAA 58 (L) 05/30/2014 0127   GFRAA 47 (L) 09/02/2020 0920   GFRAA >60 05/30/2014 0127    No results found for: SPEP, UPEP  Lab Results  Component Value Date   WBC 6.1 10/27/2021   NEUTROABS 3.1 10/27/2021   HGB 11.8 (L) 10/27/2021   HCT 37.7 10/27/2021   MCV 94.0 10/27/2021   PLT 227 10/27/2021      Chemistry      Component Value Date/Time   NA 139 10/27/2021 0945   NA 139 05/30/2014 0127   K 4.5 10/27/2021 0945   K 3.7 05/30/2014 0127   CL 101 10/27/2021 0945   CL 106 05/30/2014 0127   CO2 28 10/27/2021 0945   CO2 25 05/30/2014 0127   BUN 15 10/27/2021 0945   BUN 19 (H) 05/30/2014 0127   CREATININE 1.07 (H) 10/27/2021 0945   CREATININE 0.98 05/30/2014 0127      Component Value Date/Time   CALCIUM 9.4 10/27/2021 0945   CALCIUM 9.0 05/30/2014 0127   ALKPHOS 68 10/27/2021 0945   ALKPHOS 58 05/08/2014 1131   AST 29 10/27/2021 0945   AST 20 05/08/2014 1131   ALT 14 10/27/2021 0945   ALT 18 05/08/2014 1131   BILITOT 0.7 10/27/2021 0945   BILITOT 0.4 05/08/2014 1131         RADIOGRAPHIC STUDIES: I have personally reviewed the radiological images as listed and agreed with the findings in the report. No results found.   ASSESSMENT & PLAN:   Malignant neoplasm of overlapping sites of left breast in female, estrogen receptor positive ([place]) #Left breast ipsilateral chest wall recurrence stage IV [2015]; NOV 7th PET scan- New hypermetabolic lesions within the spine and  RIGHT iliac bone consistent with progression metastatic skeletal disease;   Hypermetabolic lesion the anterior margin of the LEFT hepatic lobe consistent with hepatic metastasis. Nodules in the RIGHT lower lung without clear hypermetabolic activity. Focal hypermetabolic activity in the ascending colon [see below].   #Had a long discussion with the patient and family regarding progressive disease noted on PET scan.  Recommend discontinuation of Faslodex+ abema.   #Recommend starting patient on Xeloda 1000 mg per metered square twice daily [2 weeks on 1 week off]. Discussed potential side effects including but not limited to- hand-foot syndrome and diarrhea oral ulcers.  Patient could get started on Xeloda the next 3 weeks or so. [week after [place]].  Understands treatments are palliative not curative.  Indefinite treatments.  #Ascending colon mass-incidentally noted on PET scan.  Discussed with Dr. Vicente Males; kindly agrees to arrange for colonoscopy in the next 2 weeks or so.  # Bilateral hip pain- arthritis-[s/p ortho evaluation]; on meloxicam. STABLE.   # Mild anemia hemoglobin- 11-12/ CKD-;-STABLE;    # CKD- stage III- GFR 48-50- STABLE  # Anxiety/depression- celexa- STABLE  # DISPOSITION:  # referral to [facility]- Dr.Anna re: colonoscopy # follow up 4 weeks; MD; cbc/cmp;CEA/ca27-29; Dr.B  # I reviewed the blood work- with the patient in detail; also reviewed the imaging independently [as summarized above]; and with the patient in detail.        [name], MD 11/02/2021 4:11 PM

## 2021-11-02 NOTE — Progress Notes (Signed)
Here for PET scan results, but no specific complains at this time. Pt's daughter is with her today.

## 2021-11-03 ENCOUNTER — Telehealth: Payer: Self-pay | Admitting: Pharmacist

## 2021-11-03 ENCOUNTER — Telehealth: Payer: Self-pay | Admitting: Pharmacy Technician

## 2021-11-03 ENCOUNTER — Other Ambulatory Visit (HOSPITAL_COMMUNITY): Payer: Self-pay

## 2021-11-03 DIAGNOSIS — Z170 Estrogen receptor positive status [ER+]: Secondary | ICD-10-CM

## 2021-11-03 MED ORDER — CAPECITABINE 500 MG PO TABS
1500.0000 mg | ORAL_TABLET | Freq: Two times a day (BID) | ORAL | 0 refills | Status: DC
Start: 2021-11-03 — End: 2021-12-05
  Filled 2021-11-03: qty 84, 14d supply, fill #0
  Filled 2021-11-08: qty 84, 21d supply, fill #0

## 2021-11-03 NOTE — Telephone Encounter (Signed)
Oral Oncology Pharmacist Encounter  Received new prescription for Xeloda (capecitabine) for the treatment of progressive metastatic breast cancer, planned duration until disease progression or unacceptable drug toxicity. Patient was previously on abemaciclib and fulvestrant, but due to disease progression will be transitioning to treatment with capecitabine.   CMP from 10/27/21 assessed, SCr only slightly elevated, CrC ~40 mL/min. Patient would qualify for a renal dose adjustment. Prescription dose and frequency assessed.   Current medication list in Epic reviewed, one DDIs with capecitabine identified: Citalopram: Fluorouracil Products may enhance the QTc-prolonging effect of QT-prolonging Antidepressants, such as citalopram. Consider checking ECG for QTc which patient is on combination therapy.  Evaluated chart and no patient barriers to medication adherence identified.   Prescription has been e-scribed to the [facility] for benefits analysis and approval.  Oral Oncology Clinic will continue to follow for insurance authorization, copayment issues, initial counseling and start date.   [name], PharmD, BCPS, BCOP, CPP Hematology/Oncology Clinical Pharmacist Practitioner [facility]/AP Oral [place] [facility] [phone]  11/03/2021 9:47 AM

## 2021-11-03 NOTE — Telephone Encounter (Signed)
Oral Oncology Patient Advocate Encounter   Received notification from [facility] that prior authorization for Capecitabine is required.   PA submitted on CoverMyMeds Key [id number] Status is pending   Oral Oncology Clinic will continue to follow.  [place] Patient [place] Phone [phone] Fax [phone] 11/03/2021 4:29 PM

## 2021-11-04 ENCOUNTER — Other Ambulatory Visit (HOSPITAL_COMMUNITY): Payer: Self-pay

## 2021-11-04 NOTE — Telephone Encounter (Signed)
Oral Oncology Patient Advocate Encounter  Prior Authorization for Capecitabine has been approved under patients Medicare Part B benefit.  PA# [id number] Effective dates: 11/03/21 through 12/24/22  Patients co-pay is $0.00.  Oral Oncology Clinic will continue to follow.   [place] Patient [place] Phone [phone] Fax [phone] 11/04/2021 9:11 AM

## 2021-11-07 ENCOUNTER — Ambulatory Visit: Payer: Medicare HMO | Admitting: Gastroenterology

## 2021-11-08 ENCOUNTER — Other Ambulatory Visit (HOSPITAL_COMMUNITY): Payer: Self-pay

## 2021-11-09 ENCOUNTER — Other Ambulatory Visit (HOSPITAL_COMMUNITY): Payer: Self-pay

## 2021-11-14 ENCOUNTER — Ambulatory Visit: Payer: Medicare HMO | Admitting: Gastroenterology

## 2021-11-22 ENCOUNTER — Inpatient Hospital Stay (HOSPITAL_BASED_OUTPATIENT_CLINIC_OR_DEPARTMENT_OTHER): Payer: Medicare HMO | Admitting: Pharmacist

## 2021-11-22 ENCOUNTER — Other Ambulatory Visit: Payer: Self-pay

## 2021-11-22 DIAGNOSIS — C50812 Malignant neoplasm of overlapping sites of left female breast: Secondary | ICD-10-CM

## 2021-11-22 DIAGNOSIS — Z170 Estrogen receptor positive status [ER+]: Secondary | ICD-10-CM

## 2021-11-22 NOTE — Progress Notes (Signed)
Oral [place]  Telephone:([phone] Fax:([phone]  Patient Care Team: [name], MD as PCP - General (Internal Medicine) Bary Castilla, Forest Gleason, MD (General Surgery) Marden Noble, MD (Internal Medicine) [name], MD as Consulting Physician (Internal Medicine)   Name of the patient: Jessica Compton  [id number]  08-28-1941   Date of visit: 11/22/21  Virtual visit: I connected with  Jessica Compton on 11/22/21 at  2:00 PM EST by a video enabled telemedicine application and verified that I am speaking with the correct person using two identifiers. I discussed the limitations of evaluation and management by telemedicine and the availability of in person appointments. The patient expressed understanding and agreed to proceed. Locations: Patient's daughter: home , Provider: in office  HPI: Patient is a 80 y.o. female with progressive metastatic breast cancer. Patient was previously on abemaciclib and fulvestrant, but due to disease progression will be transitioning to treatment with Xeloda (capecitabine).  Reason for Consult: Xeloda (capecitabine) oral chemotherapy education.   PAST MEDICAL HISTORY: Past Medical History:  Diagnosis Date   Aneurysm ([facility]) 2007   Arthritis    Cancer ([place]) 2007   diagnosed 01/07 left breast with simple mastectomu & sn bx. The pt had a 3.5cm tumor. Frozen section report on the 2 sn were negative. On permanent sections a 0.74m micrometastasis was identified. She was not felt to require a complete axillary dissection. She has completed her Tamoxifen therapy and has been released from routine f/u with medical oncology service.   Diabetes mellitus without complication ([place]    Hyperlipidemia    Hypertension    since age 80  Malignant neoplasm of upper-outer quadrant of female breast ([facility] January 2015   Mastectomy site recurrence resected 01/26/2014, ER 90%, PR 0%, HER-2/neu  nonamplified.   Other benign neoplasm of connective and other soft tissue of trunk, unspecified 2014   Personal history of colonic polyps    Personal history of tobacco use, presenting hazards to health    Special screening for malignant neoplasms, colon    Unspecified disorder of skin and subcutaneous tissue 02/19/2013   biopsy of skin over the sternum on the right breast showed hypertrophic scar,keloid formation.    HEMATOLOGY/ONCOLOGY HISTORY:  Oncology History Overview Note  # 2007- LEFT BREAST CA STAGE II [T2N163m s/p mastec; Dr.Byrnett] ER-Pos/PR Neg; Her- 2 Neu- NEG; ACx4-Taxol x12; Femara; stopped May 2009-stopped follow up.  #JAN 2015- Post mastectomy Chest wall recurrence [chest wall mass bx-]; ER- 90%; PR- NEG; Her2 Neu- NEG; s/p excision [involving skeletal muscle/adipose tissue; clear margins]; CT July 2017- NED;   # Jan 2020- progression/ hilar- START abema [jan 14th]+ faslodex[jan 13th]; OCT-NOV 2022-PET scan shows progressive disease in the bones to liver.  Discontinue Abema+ Faslodex  # NOV 28th week- START Xeloda 2w/1w  # DEC 2021- anemia/? CKD [[facility]  # OSTEOPENIA [BMD- June 2016]  # Kidney cysts  DIAGNOSIS: LEFT BREAST CA  STAGE: IV- NED  ;GOALS: pallaitive  CURRENT/MOST RECENT THERAPY:     Breast cancer [place]ized to skin ([place] 02/05/2014 Initial Diagnosis   Breast cancer metastasized to skin ([facility]  Malignant neoplasm of overlapping sites of left breast in female, estrogen receptor positive ([facility] 07/24/2016 Initial Diagnosis   Cancer of overlapping sites of left female breast ([facility]  01/06/2019 -  Chemotherapy   The patient had fulvestrant (FASLODEX) injection 500 mg, 500 mg, Intramuscular,  Once, 1 of 4 cycles Dose modification:  500 mg (original dose 500 mg, Cycle 1) Administration: 500 mg (08/05/2020)   for chemotherapy treatment.       ALLERGIES:  is allergic to metoprolol.  MEDICATIONS:  Current Outpatient Medications  Medication Sig  Dispense Refill   amLODipine (NORVASC) 10 MG tablet TAKE 1 TABLET(10 MG) BY MOUTH DAILY 90 tablet 1   atorvastatin (LIPITOR) 20 MG tablet Take 20 mg by mouth daily at 6 PM.  (Patient not taking: Reported on 11/02/2021)     Calcium Carbonate-Vitamin D 600-200 MG-UNIT CAPS Take 200 capsules by mouth once.     capecitabine (XELODA) 500 MG tablet Take 3 tablets (1,500 mg total) by mouth 2 (two) times daily after a meal. Take for 14 days, then hold for 7 days. Repeat every 21 days. 84 tablet 0   citalopram (CELEXA) 40 MG tablet Take 1 tablet (40 mg total) by mouth daily. 90 tablet 1   lisinopril (ZESTRIL) 2.5 MG tablet Take 1 tablet (2.5 mg total) by mouth daily. 90 tablet 1   meloxicam (MOBIC) 15 MG tablet  (Patient not taking: Reported on 11/02/2021)     No current facility-administered medications for this visit.    VITAL SIGNS: There were no vitals taken for this visit. There were no vitals filed for this visit.  Estimated body mass index is 24.05 kg/m as calculated from the following:   Height as of 09/28/21: 5' 6" (1.676 m).   Weight as of 10/27/21: 67.6 kg (149 lb).  LABS: CBC:    Component Value Date/Time   WBC 6.1 10/27/2021 0945   HGB 11.8 (L) 10/27/2021 0945   HGB 11.8 (L) 05/30/2014 0127   HCT 37.7 10/27/2021 0945   HCT 37.3 05/30/2014 0127   PLT 227 10/27/2021 0945   PLT 193 05/30/2014 0127   MCV 94.0 10/27/2021 0945   MCV 90 05/30/2014 0127   NEUTROABS 3.1 10/27/2021 0945   NEUTROABS 5.6 05/30/2014 0127   LYMPHSABS 2.3 10/27/2021 0945   LYMPHSABS 1.7 05/30/2014 0127   MONOABS 0.5 10/27/2021 0945   MONOABS 0.7 05/30/2014 0127   EOSABS 0.1 10/27/2021 0945   EOSABS 0.1 05/30/2014 0127   BASOSABS 0.1 10/27/2021 0945   BASOSABS 0.0 05/30/2014 0127   Comprehensive Metabolic Panel:    Component Value Date/Time   NA 139 10/27/2021 0945   NA 139 05/30/2014 0127   K 4.5 10/27/2021 0945   K 3.7 05/30/2014 0127   CL 101 10/27/2021 0945   CL 106 05/30/2014 0127   CO2 28  10/27/2021 0945   CO2 25 05/30/2014 0127   BUN 15 10/27/2021 0945   BUN 19 (H) 05/30/2014 0127   CREATININE 1.07 (H) 10/27/2021 0945   CREATININE 0.98 05/30/2014 0127   GLUCOSE 130 (H) 10/27/2021 0945   GLUCOSE 136 (H) 05/30/2014 0127   CALCIUM 9.4 10/27/2021 0945   CALCIUM 9.0 05/30/2014 0127   AST 29 10/27/2021 0945   AST 20 05/08/2014 1131   ALT 14 10/27/2021 0945   ALT 18 05/08/2014 1131   ALKPHOS 68 10/27/2021 0945   ALKPHOS 58 05/08/2014 1131   BILITOT 0.7 10/27/2021 0945   BILITOT 0.4 05/08/2014 1131   PROT 8.2 (H) 10/27/2021 0945   PROT 7.8 05/08/2014 1131   ALBUMIN 4.2 10/27/2021 0945   ALBUMIN 3.7 05/08/2014 1131     Present during today's visit: patient's daughter  Start plan: Ms. Masse will get started on her capecitabine tomorrow 11/23/21   Patient Education I spoke with patient for overview of new oral  chemotherapy medication: capecitabine   Patient has received previously mailed medication handout and calendar.  Administration: Counseled patient on administration, dosing, side effects, monitoring, drug-food interactions, safe handling, storage, and disposal. Patient will take 3 tablets (1,500 mg total) by mouth 2 (two) times daily after a meal. Take for 14 days, then hold for 7 days. Repeat every 21 days.  Side Effects: Side effects include but not limited to: diarrhea, hand-foot syndrome, mouth sores, edema, decreased wbc, fatigue, N/V.   Diarrhea: instructed to pick up loperamide to have on hand if needed. They know to call the office if Ms. Gruetzmacher has 4 or more loose stools per day Hand-foot syndrome: patient was provided with [place] Smooth Extra Care 20 when last in clinic. Mouth sores: Discussed preventative measures like good oral care, they know to call to obtain magic mouthwash if mouth sores occur  Drug-drug Interactions (DDI): Discussed DDI with citalopram, patient will have ECG checked for QTc at her next office visit  Adherence: After  discussion with patient no patient barriers to medication adherence identified.  Reviewed with patient importance of keeping a medication schedule and plan for any missed doses.  Jessica Compton voiced understanding and appreciation. All questions answered.  Provided patient with Oral [place] [facility] phone number. Patient knows to call the office with questions or concerns. Oral Chemotherapy Navigation Clinic will continue to follow.  Patient expressed understanding and was in agreement with this plan. She also understands that She can call clinic at any time with any questions, concerns, or complaints.   Medication Access Issues: No issues, patient fills at [facility] and has medication in hand currently  Follow-up plan: RTC in one week  Thank you for allowing me to participate in the care of this patient.   Time Total: 30 mins  Visit consisted of counseling and education on dealing with issues of symptom management in the setting of serious and potentially life-threatening illness.Greater than 50%  of this time was spent counseling and coordinating care related to the above assessment and plan.  Signed by: [name], PharmD, BCPS, Salley Slaughter, CPP Hematology/Oncology Clinical Pharmacist Practitioner [facility]/AP Oral [place] [facility] [phone]  11/22/2021 3:03 PM

## 2021-11-29 ENCOUNTER — Ambulatory Visit (INDEPENDENT_AMBULATORY_CARE_PROVIDER_SITE_OTHER): Payer: Medicare HMO | Admitting: Gastroenterology

## 2021-11-29 ENCOUNTER — Encounter: Payer: Self-pay | Admitting: Gastroenterology

## 2021-11-29 ENCOUNTER — Other Ambulatory Visit: Payer: Self-pay

## 2021-11-29 VITALS — BP 174/91 | HR 97 | Temp 97.3°F | Ht 65.0 in | Wt 153.8 lb

## 2021-11-29 DIAGNOSIS — R948 Abnormal results of function studies of other organs and systems: Secondary | ICD-10-CM

## 2021-11-29 MED ORDER — NA SULFATE-K SULFATE-MG SULF 17.5-3.13-1.6 GM/177ML PO SOLN: 354.0000 mL | Freq: Once | ORAL | 0 refills | Status: AC

## 2021-11-29 NOTE — Progress Notes (Signed)
Jonathon Bellows MD, MRCP(U.K) [address]  [place], [place] [address]  Main: [phone]  Fax: [phone]   Gastroenterology Consultation  Referring Provider:     [name], * Primary Care Physician:  [name], MD Primary Gastroenterologist:  Dr. Jonathon Bellows  Reason for Consultation: Abnormal PET scan        HPI:   Jessica Compton is a 80 y.o. y/o female referred for abnormal findings on recent PET scan.  She is being treated for metastatic breast cancer on immunotherapy.  Had a PET scan on 10/31/2021 that showed hypermetabolic lesions in the spine and right iliac bone focal hypermetabolic activity in the ascending colon recommend colonoscopy..  Hemoglobin 7.8 g on 10/27/2021 last colonoscopy was back in 2016 by Dr. Bary Castilla that showed one 7 mm polyp in the descending colon that was resected.  It was a tubular adenoma.  She has no lower GI symptoms.  Normal bowel movements.  No rectal bleeding.  No abdominal pain. Past Medical History:  Diagnosis Date   Aneurysm ([place]) 2007   Arthritis    Cancer ([place]) 2007   diagnosed 01/07 left breast with simple mastectomu & sn bx. The pt had a 3.5cm tumor. Frozen section report on the 2 sn were negative. On permanent sections a 0.32m micrometastasis was identified. She was not felt to require a complete axillary dissection. She has completed her Tamoxifen therapy and has been released from routine f/u with medical oncology service.   Di[place] mellitus without complication ([facility]    Hyperlipidemia    Hypertension    since age 80  Malignant neoplasm of upper-outer quadrant of female breast ([facility] January 2015   Mastectomy site recurrence resected 01/26/2014, ER 90%, PR 0%, HER-2/neu nonamplified.   Other benign neoplasm of connective and other soft tissue of trunk, unspecified 2014   Personal history of colonic polyps    Personal history of tobacco use, presenting hazards to health    Special screening for  malignant neoplasms, colon    Unspecified disorder of skin and subcutaneous tissue 02/19/2013   biopsy of skin over the sternum on the right breast showed hypertrophic scar,keloid formation.    Past Surgical History:  Procedure Laterality Date   ABDOMINAL HYSTERECTOMY  2004   total   BREAST BIOPSY Right January 26, 2014   Core biopsy for mild increase breast uptake on PET scan, usual ductal hyperplasia and stromal fibrosis   BREAST SURGERY Left 2007   mastectomy   chest wall mass  01/26/14   CHOLECYSTECTOMY  2007   COLONOSCOPY W/ POLYPECTOMY  2011   Dr. EBrynda Greathouse  EYE SURGERY Right    cataract surgery   head surgery  1991   MASTECTOMY Left 2007   PORT-A-CATH REMOVAL  2008   PORTACATH PLACEMENT  2007    Prior to Admission medications   Medication Sig Start Date End Date Taking? Authorizing Provider  amLODipine (NORVASC) 10 MG tablet TAKE 1 TABLET(10 MG) BY MOUTH DAILY 09/12/21  Yes BCammie Sickle MD  atorvastatin (LIPITOR) 20 MG tablet Take 20 mg by mouth daily at 6 PM. 08/14/14  Yes [provider]  Calcium Carbonate-Vitamin D 600-200 MG-UNIT CAPS Take 200 capsules by mouth once. 07/20/15  Yes [provider]  capecitabine (XELODA) 500 MG tablet Take 3 tablets (1,500 mg total) by mouth 2 (two) times daily after a meal. Take for 14 days, then hold for 7 days. Repeat every 21 days. 11/03/21  Yes [name], MD  citalopram (CELEXA) 40 MG tablet Take 1 tablet (40 mg total) by mouth daily. 08/26/21  Yes [name], MD  lisinopril (ZESTRIL) 2.5 MG tablet Take 1 tablet (2.5 mg total) by mouth daily. 10/28/20  Yes [name], MD  meloxicam (MOBIC) 15 MG tablet  05/06/21  Yes [provider]    Family History  Problem Relation Age of Onset   Cancer Other        ovarian,breast,colon cancers;relations not listed   Breast cancer Neg Hx      Social History   Tobacco Use   Smoking status: Some Days    Packs/day: 1.00    Years:  50.00    Pack years: 50.00    Types: Cigarettes   Smokeless tobacco: Never  Substance Use Topics   Alcohol use: No    Alcohol/week: 0.0 standard drinks   Drug use: No    Allergies as of 11/29/2021 - Review Complete 11/29/2021  Allergen Reaction Noted   Metoprolol  09/08/2014    Review of Systems:    All systems reviewed and negative except where noted in HPI.   Physical Exam:  BP (!) 174/91   Pulse 97   Temp (!) 97.3 F (36.3 C) (Oral)   Ht _0  (1.651 m)   Wt 153 lb 12.8 oz (69.8 kg)   BMI 25.59 kg/m  No LMP recorded. Patient has had a hysterectomy. Psych:  Alert and cooperative. Normal mood and affect. General:   Alert,  Well-developed, well-nourished, pleasant and cooperative in NAD Head:  Normocephalic and atraumatic. Eyes:  Sclera clear, no icterus.   Conjunctiva pink. Ears:  Normal auditory acuity. Nose:  No deformity, discharge, or lesions. Psych:  Alert and cooperative. Normal mood and affect.  Imaging Studies: NM PET Image Restage (PS) Skull Base to Thigh (F-18 FDG)  Result Date: 10/31/2021 CLINICAL DATA:  Subsequent treatment strategy for breast cancer. LEFT breast cancer. Chest wall recurrence. EXAM: NUCLEAR MEDICINE PET SKULL BASE TO THIGH TECHNIQUE: 8.3 mCi F-18 FDG was injected intravenously. Full-ring PET imaging was performed from the skull base to thigh after the radiotracer. CT data was obtained and used for attenuation correction and anatomic localization. Fasting blood glucose: 125 mg/dl COMPARISON:  CT 10/18/2021, PET-CT 11/17/2019 FINDINGS: Mediastinal blood pool activity: SUV max 2.5 Liver activity: SUV max NA NECK: No hypermetabolic lymph nodes in the neck. Incidental CT findings: none CHEST: Several nodules in the RIGHT lower lobe (image 110/3) with minimal metabolic activity. Subcarinal lymph node with mild radiotracer activity (SUV max 3.9). This node is not enlarged Incidental CT findings: none ABDOMEN/PELVIS: Along the anterior margin of the LEFT  hepatic lobe there is a focus of metabolic activity SUV max equal 5.1 on image 122. This corresponds to the lesion of concern on comparison CT. Low-attenuation lesion on current noncontrast CT measures 15 mm. There is a focus of intense focal metabolic activity associated with the ascending colon (image 61) with SUV max equal 10.7. Potential mural lesion on image 162/CT series 3. Incidental CT findings: none SKELETON: Several new hypermetabolic lesions in thoracic spine. For example mixed lytic and sclerotic lesion in the T9 vertebral body SUV max equal 3.9 on image 91. Hypermetabolic lesion in the U82 vertebral body SUV max equal 3.9. Lucent lesion with sclerotic rim measuring 12 mm on image 61 with SUV max equal 2.7. Lesion in the RIGHT iliac bone with SUV max equal 3.9 on image 186 has no clear CT correlation  Incidental CT findings: none IMPRESSION: 1. New hypermetabolic lesions within the spine and RIGHT iliac bone consistent with progression metastatic skeletal disease. 2. Hypermetabolic lesion the anterior margin of the LEFT hepatic lobe consistent with hepatic metastasis. 3. Nodules in the RIGHT lower lung without clear hypermetabolic activity. Recommend attention on follow-up. 4. Focal hypermetabolic activity in the ascending colon. Recommend colonoscopy for further evaluation if not current for screening. Electronically Signed   By: Suzy Bouchard M.D.   On: 10/31/2021 14:18    Assessment and Plan:   Jessica Compton is a 80 y.o. y/o female with a history of metastatic breast cancer on recent PET scan on 11/02/2021 was found to have increased uptake in the ascending colon and a colonoscopy was recommended.  Last colonoscopy was normal in the ascending colon back in 2016.  Presently has no symptoms   Plan 1.  Colonoscopy : Offered her first available MD but she said she would like to have it done after Christmas.  We will plan to [facility] end of December or the first week of January   I have discussed  alternative options, risks & benefits,  which include, but are not limited to, bleeding, infection, perforation,respiratory complication & drug reaction.  The patient agrees with this plan & written consent will be obtained.     Follow up in as needed  Dr Jonathon Bellows MD,MRCP(U.K)

## 2021-11-30 ENCOUNTER — Inpatient Hospital Stay: Payer: Medicare HMO | Admitting: Pharmacist

## 2021-11-30 ENCOUNTER — Encounter: Payer: Self-pay | Admitting: Internal Medicine

## 2021-11-30 ENCOUNTER — Other Ambulatory Visit: Payer: Self-pay

## 2021-11-30 ENCOUNTER — Inpatient Hospital Stay (HOSPITAL_BASED_OUTPATIENT_CLINIC_OR_DEPARTMENT_OTHER): Payer: Medicare HMO | Admitting: Internal Medicine

## 2021-11-30 ENCOUNTER — Inpatient Hospital Stay: Payer: Medicare HMO | Attending: Internal Medicine

## 2021-11-30 DIAGNOSIS — C50812 Malignant neoplasm of overlapping sites of left female breast: Secondary | ICD-10-CM | POA: Insufficient documentation

## 2021-11-30 DIAGNOSIS — Z79899 Other long term (current) drug therapy: Secondary | ICD-10-CM | POA: Diagnosis not present

## 2021-11-30 DIAGNOSIS — Z01818 Encounter for other preprocedural examination: Secondary | ICD-10-CM | POA: Diagnosis not present

## 2021-11-30 DIAGNOSIS — Z7901 Long term (current) use of anticoagulants: Secondary | ICD-10-CM | POA: Diagnosis not present

## 2021-11-30 DIAGNOSIS — C7989 Secondary malignant neoplasm of other specified sites: Secondary | ICD-10-CM | POA: Diagnosis not present

## 2021-11-30 DIAGNOSIS — D649 Anemia, unspecified: Secondary | ICD-10-CM | POA: Insufficient documentation

## 2021-11-30 DIAGNOSIS — M8580 Other specified disorders of bone density and structure, unspecified site: Secondary | ICD-10-CM | POA: Insufficient documentation

## 2021-11-30 DIAGNOSIS — Z170 Estrogen receptor positive status [ER+]: Secondary | ICD-10-CM

## 2021-11-30 DIAGNOSIS — C792 Secondary malignant neoplasm of skin: Secondary | ICD-10-CM | POA: Insufficient documentation

## 2021-11-30 LAB — COMPREHENSIVE METABOLIC PANEL
ALT: 16 U/L (ref 0–44)
AST: 24 U/L (ref 15–41)
Albumin: 3.8 g/dL (ref 3.5–5.0)
Alkaline Phosphatase: 88 U/L (ref 38–126)
Anion gap: 10 (ref 5–15)
BUN: 20 mg/dL (ref 8–23)
CO2: 28 mmol/L (ref 22–32)
Calcium: 9.3 mg/dL (ref 8.9–10.3)
Chloride: 99 mmol/L (ref 98–111)
Creatinine, Ser: 0.92 mg/dL (ref 0.44–1.00)
GFR, Estimated: >60 mL/min (ref >60)
Glucose, Bld: 153 mg/dL — ABNORMAL HIGH (ref 70–99)
Potassium: 3.9 mmol/L (ref 3.5–5.1)
Sodium: 137 mmol/L (ref 135–145)
Total Bilirubin: 0.5 mg/dL (ref 0.3–1.2)
Total Protein: 7.7 g/dL (ref 6.5–8.1)

## 2021-11-30 LAB — CBC WITH DIFFERENTIAL/PLATELET
Abs Immature Granulocytes: 0.02 10*3/uL (ref 0.00–0.07)
Basophils Absolute: 0 10*3/uL (ref 0.0–0.1)
Basophils Relative: 0 %
Eosinophils Absolute: 0.2 10*3/uL (ref 0.0–0.5)
Eosinophils Relative: 3 %
HCT: 36.4 % (ref 36.0–46.0)
Hemoglobin: 11.4 g/dL — ABNORMAL LOW (ref 12.0–15.0)
Immature Granulocytes: 0 %
Lymphocytes Relative: 40 %
Lymphs Abs: 2.5 10*3/uL (ref 0.7–4.0)
MCH: 29.6 pg (ref 26.0–34.0)
MCHC: 31.3 g/dL (ref 30.0–36.0)
MCV: 94.5 fL (ref 80.0–100.0)
Monocytes Absolute: 0.6 10*3/uL (ref 0.1–1.0)
Monocytes Relative: 9 %
Neutro Abs: 2.9 10*3/uL (ref 1.7–7.7)
Neutrophils Relative %: 48 %
Platelets: 226 10*3/uL (ref 150–400)
RBC: 3.85 MIL/uL — ABNORMAL LOW (ref 3.87–5.11)
RDW: 13.4 % (ref 11.5–15.5)
WBC: 6.2 10*3/uL (ref 4.0–10.5)
nRBC: 0 % (ref 0.0–0.2)

## 2021-11-30 MED ORDER — MELOXICAM 15 MG PO TABS: 15.0000 mg | ORAL_TABLET | Freq: Every day | ORAL | 1 refills | Status: DC

## 2021-11-30 NOTE — Assessment & Plan Note (Addendum)
#  Left breast ipsilateral chest wall recurrence stage IV [2015]; NOV 7th PET scan- New hypermetabolic lesions within the spine and RIGHT iliac bone consistent with progression metastatic skeletal disease;  Hypermetabolic lesion the anterior margin of the LEFT hepatic lobe consistent with hepatic metastasis. Nodules in the RIGHT lower lung without clear hypermetabolic activity. Focal hypermetabolic activity in the ascending colon [see below].  Discontinued Faslodex+ abema.   # Currently on xeloda [1500mg  BID; [2w;1w]-Started dec 1st. patient tolerating Xeloda well without any major side effects.- finish cycle #1 on dec 15th- plan to start cycle #2 on dec on 22nd.  EKG reviewed-no abnormalities noted.  #Ascending colon mass-incidentally noted on PET scan.  S/p  Dr. Vicente Males;  Awaiting colonoscpy in last week of dec 2022 [pt pref]  # Bilateral hip pain- arthritis-[s/p ortho evaluation]; on meloxicam. STABLE; refilled.   # Mild anemia hemoglobin- 11-12/ CKD-;-STABLE;    # CKD- stage III- GFR 48-50- STABLE;    # Anxiety/depression- celexa- STABLE;   # DISPOSITION:  # follow up week of 26th-; MD; cbc/cmp;CEA/ca27-29; Dr.B

## 2021-11-30 NOTE — Progress Notes (Signed)
Pt in for follow up, states saw Dr Vicente Males yesterday and has colonoscopy scheduled for December 29.  Pt request refill of meloxicam.

## 2021-11-30 NOTE — Progress Notes (Signed)
[facility]  [phone] [phone]  Patient Care Team: [name], MD as PCP - General (Internal Medicine) Bary Castilla, Forest Gleason, MD (General Surgery) Marden Noble, MD (Internal Medicine) [name], MD as Consulting Physician (Internal Medicine)   Name of the patient: Jessica Compton  [id number]  1941/06/21   Date of visit: 11/30/21  HPI: Patient is a 80 y.o. female with progressive metastatic breast cancer. Patient was previously on abemaciclib and fulvestrant, but due to disease progression patient was transitioned to treatment with Xeloda (capecitabine). She started her capecitabine on 11/23/21.  Reason for Consult: Oral chemotherapy follow-up for capecitabine therapy.   PAST MEDICAL HISTORY: Past Medical History:  Diagnosis Date   Aneurysm ([place]) 2007   Arthritis    Cancer ([place]) 2007   diagnosed 01/07 left breast with simple mastectomu & sn bx. The pt had a 3.5cm tumor. Frozen section report on the 2 sn were negative. On permanent sections a 0.75m micrometastasis was identified. She was not felt to require a complete axillary dissection. She has completed her Tamoxifen therapy and has been released from routine f/u with medical oncology service.   Diabetes mellitus without complication ([facility]    Hyperlipidemia    Hypertension    since age 80  Malignant neoplasm of upper-outer quadrant of female breast ([facility] January 2015   Mastectomy site recurrence resected 01/26/2014, ER 90%, PR 0%, HER-2/neu nonamplified.   Other benign neoplasm of connective and other soft tissue of trunk, unspecified 2014   Personal history of colonic polyps    Personal history of tobacco use, presenting hazards to health    Special screening for malignant neoplasms, colon    Unspecified disorder of skin and subcutaneous tissue 02/19/2013   biopsy of skin over the sternum on the right breast showed hypertrophic scar,keloid  formation.    HEMATOLOGY/ONCOLOGY HISTORY:  Oncology History Overview Note  # 2007- LEFT BREAST CA STAGE II [T2N119m s/p mastec; Dr.Byrnett] ER-Pos/PR Neg; Her- 2 Neu- NEG; ACx4-Taxol x12; Femara; stopped May 2009-stopped follow up.  #JAN 2015- Post mastectomy Chest wall recurrence [chest wall mass bx-]; ER- 90%; PR- NEG; Her2 Neu- NEG; s/p excision [involving skeletal muscle/adipose tissue; clear margins]; CT July 2017- NED;   # Jan 2020- progression/ hilar- START abema [jan 14th]+ faslodex[jan 13th]; OCT-NOV 2022-PET scan shows progressive disease in the bones to liver.  Discontinue Abema+ Faslodex  # NOV 28th week- START Xeloda 2w/1w  # DEC 2021- anemia/? CKD [[email or web address]  # OSTEOPENIA [BMD- June 2016]  # Kidney cysts  DIAGNOSIS: LEFT BREAST CA  STAGE: IV- NED  ;GOALS: pallaitive  CURRENT/MOST RECENT THERAPY:     [place] 02/05[place] Initial Diagnosis   [place]  Malignant neoplasm of overlapping sites of left breast in female, estrogen receptor positive ([facility] 07/24/2016 Initial Diagnosis   [place] of [place] sites of left female breast ([facility]  01/06/2019 -  Chemotherapy   The patient had fulvestrant (FASLODEX) injection 500 mg, 500 mg, Intramuscular,  Once, 1 of 4 cycles Dose modification: 500 mg (original dose 500 mg, Cycle 1) Administration: 500 mg (08/05/2020)   for chemotherapy treatment.       ALLERGIES:  is allergic to metoprolol.  MEDICATIONS:  Current Outpatient Medications  Medication Sig Dispense Refill   amLODipine (NORVASC) 10 MG tablet TAKE 1 TABLET(10 MG) BY MOUTH DAILY 90 tablet 1   atorvastatin (LIPITOR) 20 MG tablet  Take 20 mg by mouth daily at 6 PM.     Calcium Carbonate-Vitamin D 600-200 MG-UNIT CAPS Take 200 capsules by mouth once.     capecitabine (XELODA) 500 MG tablet Take 3 tablets (1,500 mg total) by mouth 2 (two) times daily after a meal. Take for 14 days, then hold for 7  days. Repeat every 21 days. 84 tablet 0   citalopram (CELEXA) 40 MG tablet Take 1 tablet (40 mg total) by mouth daily. 90 tablet 1   lisinopril (ZESTRIL) 2.5 MG tablet Take 1 tablet (2.5 mg total) by mouth daily. 90 tablet 1   meloxicam (MOBIC) 15 MG tablet      No current facility-administered medications for this visit.    VITAL SIGNS: There were no vitals taken for this visit. There were no vitals filed for this visit.  Estimated body mass index is 25.63 kg/m as calculated from the following:   Height as of 11/29/21: 5' 5"  (1.651 m).   Weight as of an earlier encounter on 11/30/21: 69.9 kg (154 lb).  LABS: CBC:    Component Value Date/Time   WBC 6.2 11/30/2021 1454   HGB 11.4 (L) 11/30/2021 1454   HGB 11.8 (L) 05/30/2014 0127   HCT 36.4 11/30/2021 1454   HCT 37.3 05/30/2014 0127   PLT 226 11/30/2021 1454   PLT 193 05/30/2014 0127   MCV 94.5 11/30/2021 1454   MCV 90 05/30/2014 0127   NEUTROABS 2.9 11/30/2021 1454   NEUTROABS 5.6 05/30/2014 0127   LYMPHSABS 2.5 11/30/2021 1454   LYMPHSABS 1.7 05/30/2014 0127   MONOABS 0.6 11/30/2021 1454   MONOABS 0.7 05/30/2014 0127   EOSABS 0.2 11/30/2021 1454   EOSABS 0.1 05/30/2014 0127   BASOSABS 0.0 11/30/2021 1454   BASOSABS 0.0 05/30/2014 0127   Comprehensive Metabolic Panel:    Component Value Date/Time   NA 137 11/30/2021 1454   NA 139 05/30/2014 0127   K 3.9 11/30/2021 1454   K 3.7 05/30/2014 0127   CL 99 11/30/2021 1454   CL 106 05/30/2014 0127   CO2 28 11/30/2021 1454   CO2 25 05/30/2014 0127   BUN 20 11/30/2021 1454   BUN 19 (H) 05/30/2014 0127   CREATININE 0.92 11/30/2021 1454   CREATININE 0.98 05/30/2014 0127   GLUCOSE 153 (H) 11/30/2021 1454   GLUCOSE 136 (H) 05/30/2014 0127   CALCIUM 9.3 11/30/2021 1454   CALCIUM 9.0 05/30/2014 0127   AST 24 11/30/2021 1454   AST 20 05/08/2014 1131   ALT 16 11/30/2021 1454   ALT 18 05/08/2014 1131   ALKPHOS 88 11/30/2021 1454   ALKPHOS 58 05/08/2014 1131   BILITOT 0.5  11/30/2021 1454   BILITOT 0.4 05/08/2014 1131   PROT 7.7 11/30/2021 1454   PROT 7.8 05/08/2014 1131   ALBUMIN 3.8 11/30/2021 1454   ALBUMIN 3.7 05/08/2014 1131     Present during today's visit: patient and her daughter   Assessment and Plan: Continue capecitabine 1566m twice daily 14 days on/7 days off  ECG rechecked at today's visit d/ DDI with citalopram, QTc normal at 442 ms   Oral Chemotherapy Side Effect/Intolerance:  Not reports diarrhea, hand-foot syndrome, nausea, or edema  Oral Chemotherapy Adherence: no reported missed doses Ms. IDalbert Batmanpatient barriers to medication adherence identified.   New medications: none reported  Medication Access Issues: no issues[facility] Patient expressed understanding and was in agreement with this plan. She also understands that She can call clinic at any  time with any questions, concerns, or complaints.   Follow-up plan: RTC in 3 weeks  Thank you for allowing me to participate in the care of this very pleasant patient.   Time Total: 15 mins  Visit consisted of counseling and education on dealing with issues of symptom management in the setting of serious and potentially life-threatening illness.Greater than 50%  of this time was spent counseling and coordinating care related to the above assessment and plan.  Signed by: [name], PharmD, BCPS, Salley Slaughter, CPP Hematology/Oncology Clinical Pharmacist Practitioner [place]/DB/AP Oral [facility] [phone]  11/30/2021 3:59 PM

## 2021-11-30 NOTE — Progress Notes (Signed)
[place] OFFICE PROGRESS NOTE  Patient Care Team: [name], MD as PCP - General (Internal Medicine) Bary Castilla, Forest Gleason, MD (General Surgery) Marden Noble, MD (Internal Medicine) [name], MD as Consulting Physician (Internal Medicine)   SUMMARY OF ONCOLOGIC HISTORY:  Oncology History Overview Note  # 2007- LEFT BREAST CA STAGE II [T2N42m; s/p mastec; Dr.Byrnett] ER-Pos/PR Neg; Her- 2 Neu- NEG; ACx4-Taxol x12; Femara; stopped May 2009-stopped follow up.  #JAN 2015- Post mastectomy Chest wall recurrence [chest wall mass bx-]; ER- 90%; PR- NEG; Her2 Neu- NEG; s/p excision [involving skeletal muscle/adipose tissue; clear margins]; CT July 2017- NED;   # Jan 2020- progression/ hilar- START abema [jan 14th]+ faslodex[jan 13th]; OCT-NOV 2022-PET scan shows progressive disease in the bones to liver.  Discontinue Abema+ Faslodex  # DEC 1st, 2022- START Xeloda 2w/1w  # DEC 2021- anemia/? CKD [[facility]];  # OSTEOPENIA [BMD- June 2016]  # Kidney cysts  DIAGNOSIS: LEFT BREAST CA  STAGE: IV- NED  ;GOALS: pallaitive  CURRENT/MOST RECENT THERAPY:     Breast cancer metastasized to skin ([facility]  02/05/2014 Initial Diagnosis   Breast cancer metastasized to skin ([facility]   Malignant neoplasm of overlapping sites of left breast in female, estrogen receptor positive ([facility]  07/24/2016 Initial Diagnosis   Cancer of overlapping sites of left female breast ([facility]   01/06/2019 -  Chemotherapy   The patient had fulvestrant (FASLODEX) injection 500 mg, 500 mg, Intramuscular,  Once, 1 of 4 cycles Dose modification: 500 mg (original dose 500 mg, Cycle 1) Administration: 500 mg (08/05/2020)   for chemotherapy treatment.      INTERVAL HISTORY: Accompanied by sister, Jessica Compton.; Ambulating independently.  A very pleasant 80year old African-American female patient with above history of ER/PR positive breast cancer stage IV currently on Xeloda is here for  follow-up.  Patient has been on Xarelto for approximately a week now.  Denies any diarrhea.  No rash on palms and soles. Denies any worsening shortness of breath or cough.  No fever no chills.  Chronic mild joint pains not any worse.   Review of Systems  Constitutional:  Positive for malaise/fatigue. Negative for chills, diaphoresis, fever and weight loss.  HENT:  Negative for nosebleeds and sore throat.   Eyes:  Negative for double vision.  Respiratory:  Negative for cough, hemoptysis, sputum production, shortness of breath and wheezing.   Cardiovascular:  Negative for chest pain, palpitations, orthopnea and leg swelling.  Gastrointestinal:  Negative for abdominal pain, blood in stool, constipation, diarrhea, heartburn, melena, nausea and vomiting.  Musculoskeletal:  Positive for back pain and joint pain.  Skin: Negative.  Negative for itching and rash.  Neurological:  Negative for tingling, focal weakness, weakness and headaches.  Endo/Heme/Allergies:  Does not bruise/bleed easily.  Psychiatric/Behavioral:  Negative for depression. The patient is not nervous/anxious and does not have insomnia.   PAST MEDICAL HISTORY :  Past Medical History:  Diagnosis Date   Aneurysm ([facility] [place] 2007   Arthritis    Cancer ([facility] 2007   diagnosed 01/07 left breast with simple mastectomu & sn bx. The pt had a 3.5cm tumor. Frozen section report on the 2 sn were negative. On permanent sections a 0.51mmicrometastasis was identified. She was not felt to require a complete axillary dissection. She has completed her Tamoxifen therapy and has been released from routine f/u with medical oncology service.   Diabetes mellitus without complication ([facility]   Hyperlipidemia    Hypertension    since  age 39   Malignant neoplasm of upper-outer quadrant of female breast [facility]) January 2015   Mastectomy site recurrence resected 01/26/2014, ER 90%, PR 0%, HER-2/neu nonamplified.   Other benign neoplasm of connective and other soft  tissue of trunk, unspecified 2014   Personal history of colonic polyps    Personal history of tobacco use, presenting hazards to health    Special screening for malignant neoplasms, colon    Unspecified disorder of skin and subcutaneous tissue 02/19/2013   biopsy of skin over the sternum on the right breast showed hypertrophic scar,keloid formation.    PAST SURGICAL HISTORY :   Past Surgical History:  Procedure Laterality Date   ABDOMINAL HYSTERECTOMY  2004   total   BREAST BIOPSY Right January 26, 2014   Core biopsy for mild increase breast uptake on PET scan, usual ductal hyperplasia and stromal fibrosis   BREAST SURGERY Left 2007   mastectomy   chest wall mass  01/26/14   CHOLECYSTECTOMY  2007   COLONOSCOPY W/ POLYPECTOMY  2011   Dr. Brynda Greathouse   EYE SURGERY Right    cataract surgery   head surgery  1991   MASTECTOMY Left 2007   PORT-A-CATH REMOVAL  2008   PORTACATH PLACEMENT  2007    FAMILY HISTORY :   Family History  Problem Relation Age of Onset   Cancer Other        ovarian,breast,colon cancers;relations not listed   Breast cancer Neg Hx     SOCIAL HISTORY:   Social History   Tobacco Use   Smoking status: Some Days    Packs/day: 1.00    Years: 50.00    Pack years: 50.00    Types: Cigarettes   Smokeless tobacco: Never  Substance Use Topics   Alcohol use: No    Alcohol/week: 0.0 standard drinks   Drug use: No    ALLERGIES:  is allergic to metoprolol.  MEDICATIONS:  Current Outpatient Medications  Medication Sig Dispense Refill   amLODipine (NORVASC) 10 MG tablet TAKE 1 TABLET(10 MG) BY MOUTH DAILY 90 tablet 1   atorvastatin (LIPITOR) 20 MG tablet Take 20 mg by mouth daily at 6 PM.     Calcium Carbonate-Vitamin D 600-200 MG-UNIT CAPS Take 200 capsules by mouth once.     capecitabine (XELODA) 500 MG tablet Take 3 tablets (1,500 mg total) by mouth 2 (two) times daily after a meal. Take for 14 days, then hold for 7 days. Repeat every 21 days. 84 tablet 0    citalopram (CELEXA) 40 MG tablet Take 1 tablet (40 mg total) by mouth daily. 90 tablet 1   lisinopril (ZESTRIL) 2.5 MG tablet Take 1 tablet (2.5 mg total) by mouth daily. 90 tablet 1   meloxicam (MOBIC) 15 MG tablet Take 1 tablet (15 mg total) by mouth daily. 60 tablet 1   No current facility-administered medications for this visit.    PHYSICAL EXAMINATION: ECOG PERFORMANCE STATUS: 0 - Asymptomatic  BP (!) 154/82 (BP Location: Right Arm, Patient Position: Sitting)   Pulse 79   Temp 97.7 F (36.5 C) (Tympanic)   Resp 16   Wt 154 lb (69.9 kg)   SpO2 97%   BMI 25.63 kg/m   Filed Weights   11/30/21 1525  Weight: 154 lb (69.9 kg)     Physical Exam HENT:     Head: Normocephalic and atraumatic.     Mouth/Throat:     Pharynx: No oropharyngeal exudate.  Eyes:     Pupils: Pupils are  equal, round, and reactive to light.  Cardiovascular:     Rate and Rhythm: Normal rate and regular rhythm.  Pulmonary:     Effort: No respiratory distress.     Breath sounds: No wheezing.  Abdominal:     General: Bowel sounds are normal. There is no distension.     Palpations: Abdomen is soft. There is no mass.     Tenderness: There is no abdominal tenderness. There is no guarding or rebound.  Musculoskeletal:        General: No tenderness. Normal range of motion.     Cervical back: Normal range of motion and neck supple.  Skin:    General: Skin is warm.  Neurological:     Mental Status: She is alert and oriented to person, place, and time.  Psychiatric:        Mood and Affect: Affect normal.   LABORATORY DATA:  I have reviewed the data as listed    Component Value Date/Time   NA 137 11/30/2021 1454   NA 139 05/30/2014 0127   K 3.9 11/30/2021 1454   K 3.7 05/30/2014 0127   CL 99 11/30/2021 1454   CL 106 05/30/2014 0127   CO2 28 11/30/2021 1454   CO2 25 05/30/2014 0127   GLUCOSE 153 (H) 11/30/2021 1454   GLUCOSE 136 (H) 05/30/2014 0127   BUN 20 11/30/2021 1454   BUN 19 (H)  05/30/2014 0127   CREATININE 0.92 11/30/2021 1454   CREATININE 0.98 05/30/2014 0127   CALCIUM 9.3 11/30/2021 1454   CALCIUM 9.0 05/30/2014 0127   PROT 7.7 11/30/2021 1454   PROT 7.8 05/08/2014 1131   ALBUMIN 3.8 11/30/2021 1454   ALBUMIN 3.7 05/08/2014 1131   AST 24 11/30/2021 1454   AST 20 05/08/2014 1131   ALT 16 11/30/2021 1454   ALT 18 05/08/2014 1131   ALKPHOS 88 11/30/2021 1454   ALKPHOS 58 05/08/2014 1131   BILITOT 0.5 11/30/2021 1454   BILITOT 0.4 05/08/2014 1131   GFRNONAA >60 11/30/2021 1454   GFRNONAA 58 (L) 05/30/2014 0127   GFRAA 47 (L) 09/02/2020 0920   GFRAA >60 05/30/2014 0127    No results found for: SPEP, UPEP  Lab Results  Component Value Date   WBC 6.2 11/30/2021   NEUTROABS 2.9 11/30/2021   HGB 11.4 (L) 11/30/2021   HCT 36.4 11/30/2021   MCV 94.5 11/30/2021   PLT 226 11/30/2021      Chemistry      Component Value Date/Time   NA 137 11/30/2021 1454   NA 139 05/30/2014 0127   K 3.9 11/30/2021 1454   K 3.7 05/30/2014 0127   CL 99 11/30/2021 1454   CL 106 05/30/2014 0127   CO2 28 11/30/2021 1454   CO2 25 05/30/2014 0127   BUN 20 11/30/2021 1454   BUN 19 (H) 05/30/2014 0127   CREATININE 0.92 11/30/2021 1454   CREATININE 0.98 05/30/2014 0127      Component Value Date/Time   CALCIUM 9.3 11/30/2021 1454   CALCIUM 9.0 05/30/2014 0127   ALKPHOS 88 11/30/2021 1454   ALKPHOS 58 05/08/2014 1131   AST 24 11/30/2021 1454   AST 20 05/08/2014 1131   ALT 16 11/30/2021 1454   ALT 18 05/08/2014 1131   BILITOT 0.5 11/30/2021 1454   BILITOT 0.4 05/08/2014 1131         RADIOGRAPHIC STUDIES: I have personally reviewed the radiological images as listed and agreed with the findings in the report. No results found.  ASSESSMENT & PLAN:   Malignant neoplasm of overlapping sites of left breast in female, estrogen receptor positive ([place]) #Left breast ipsilateral chest wall recurrence stage IV [2015]; NOV 7th PET scan- New hypermetabolic lesions within  the spine and RIGHT iliac bone consistent with progression metastatic skeletal disease;  Hypermetabolic lesion the anterior margin of the LEFT hepatic lobe consistent with hepatic metastasis. Nodules in the RIGHT lower lung without clear hypermetabolic activity. Focal hypermetabolic activity in the ascending colon [see below].  Discontinued Faslodex+ abema.   # Currently on xeloda [1572m BID; [2w;1w]-Started dec 1st. patient tolerating Xeloda well without any major side effects.- finish cycle #1 on dec 15th- plan to start cycle #2 on dec on 22nd.  EKG reviewed-no abnormalities noted.  #Ascending colon mass-incidentally noted on PET scan.  S/p  Dr. AVicente Males  Awaiting colonoscpy in last week of dec 2022 [pt pref]  # Bilateral hip pain- arthritis-[s/p ortho evaluation]; on meloxicam. STABLE; refilled.   # Mild anemia hemoglobin- 11-12/ CKD-;-STABLE;    # CKD- stage III- GFR 48-50- STABLE;    # Anxiety/depression- celexa- STABLE;   # DISPOSITION:  # follow up week of 26th-; MD; cbc/cmp;CEA/ca27-29; Dr.B        GCammie Sickle MD 12/01/2021 8:25 AM

## 2021-12-01 ENCOUNTER — Encounter: Payer: Self-pay | Admitting: Internal Medicine

## 2021-12-01 LAB — CEA: CEA: 4.8 ng/mL — ABNORMAL HIGH (ref 0.0–4.7)

## 2021-12-01 LAB — CANCER ANTIGEN 27.29: CA 27.29: 116.8 U/mL — ABNORMAL HIGH (ref 0.0–38.6)

## 2021-12-05 ENCOUNTER — Other Ambulatory Visit (HOSPITAL_COMMUNITY): Payer: Self-pay

## 2021-12-05 ENCOUNTER — Other Ambulatory Visit: Payer: Self-pay | Admitting: Internal Medicine

## 2021-12-05 DIAGNOSIS — Z170 Estrogen receptor positive status [ER+]: Secondary | ICD-10-CM

## 2021-12-05 DIAGNOSIS — C50812 Malignant neoplasm of overlapping sites of left female breast: Secondary | ICD-10-CM

## 2021-12-05 MED ORDER — CAPECITABINE 500 MG PO TABS
1500.0000 mg | ORAL_TABLET | Freq: Two times a day (BID) | ORAL | 0 refills | Status: DC
  Filled 2021-12-05: qty 84, 21d supply, fill #0

## 2021-12-08 ENCOUNTER — Other Ambulatory Visit (HOSPITAL_COMMUNITY): Payer: Self-pay

## 2021-12-20 ENCOUNTER — Telehealth: Payer: Self-pay | Admitting: Internal Medicine

## 2021-12-20 NOTE — Telephone Encounter (Signed)
completed

## 2021-12-20 NOTE — Telephone Encounter (Signed)
Caregiver called to reschedule pt appt for 12-28. Call back at [phone]

## 2021-12-21 ENCOUNTER — Inpatient Hospital Stay: Payer: Medicare HMO | Admitting: Internal Medicine

## 2021-12-21 ENCOUNTER — Inpatient Hospital Stay: Payer: Medicare HMO

## 2021-12-21 ENCOUNTER — Inpatient Hospital Stay: Payer: Medicare HMO | Admitting: Pharmacist

## 2021-12-21 ENCOUNTER — Encounter: Payer: Self-pay | Admitting: Gastroenterology

## 2021-12-22 ENCOUNTER — Encounter
Admission: RE | Disposition: A | Discharge status: Home / Self Care | Payer: Self-pay | Source: Ambulatory Visit | Attending: Gastroenterology

## 2021-12-22 ENCOUNTER — Ambulatory Visit
Admission: RE | Admit: 2021-12-22 | Discharge: 2021-12-22 | Disposition: A | Discharge status: Home / Self Care | Payer: Medicare HMO | Source: Ambulatory Visit | Attending: Gastroenterology | Admitting: Gastroenterology

## 2021-12-22 ENCOUNTER — Encounter: Payer: Self-pay | Admitting: Gastroenterology

## 2021-12-22 ENCOUNTER — Ambulatory Visit: Payer: Medicare HMO | Admitting: Anesthesiology

## 2021-12-22 DIAGNOSIS — M1990 Unspecified osteoarthritis, unspecified site: Secondary | ICD-10-CM | POA: Diagnosis not present

## 2021-12-22 DIAGNOSIS — D120 Benign neoplasm of cecum: Secondary | ICD-10-CM | POA: Diagnosis not present

## 2021-12-22 DIAGNOSIS — F17210 Nicotine dependence, cigarettes, uncomplicated: Secondary | ICD-10-CM | POA: Diagnosis not present

## 2021-12-22 DIAGNOSIS — I671 Cerebral aneurysm, nonruptured: Secondary | ICD-10-CM | POA: Diagnosis not present

## 2021-12-22 DIAGNOSIS — K635 Polyp of colon: Secondary | ICD-10-CM | POA: Diagnosis not present

## 2021-12-22 DIAGNOSIS — D123 Benign neoplasm of transverse colon: Secondary | ICD-10-CM | POA: Insufficient documentation

## 2021-12-22 DIAGNOSIS — Z1211 Encounter for screening for malignant neoplasm of colon: Secondary | ICD-10-CM | POA: Diagnosis not present

## 2021-12-22 DIAGNOSIS — K5790 Diverticulosis of intestine, part unspecified, without perforation or abscess without bleeding: Secondary | ICD-10-CM | POA: Diagnosis not present

## 2021-12-22 DIAGNOSIS — K5730 Diverticulosis of large intestine without perforation or abscess without bleeding: Secondary | ICD-10-CM | POA: Diagnosis not present

## 2021-12-22 DIAGNOSIS — R933 Abnormal findings on diagnostic imaging of other parts of digestive tract: Secondary | ICD-10-CM | POA: Insufficient documentation

## 2021-12-22 DIAGNOSIS — C50919 Malignant neoplasm of unspecified site of unspecified female breast: Secondary | ICD-10-CM | POA: Insufficient documentation

## 2021-12-22 DIAGNOSIS — Z86010 Personal history of colonic polyps: Secondary | ICD-10-CM | POA: Diagnosis not present

## 2021-12-22 DIAGNOSIS — D126 Benign neoplasm of colon, unspecified: Secondary | ICD-10-CM

## 2021-12-22 DIAGNOSIS — R948 Abnormal results of function studies of other organs and systems: Secondary | ICD-10-CM

## 2021-12-22 DIAGNOSIS — I10 Essential (primary) hypertension: Secondary | ICD-10-CM | POA: Insufficient documentation

## 2021-12-22 HISTORY — DX: Chronic obstructive pulmonary disease, unspecified: J44.9

## 2021-12-22 HISTORY — PX: COLONOSCOPY WITH PROPOFOL: SHX5780

## 2021-12-22 SURGERY — COLONOSCOPY WITH PROPOFOL
Anesthesia: General

## 2021-12-22 MED ORDER — PROPOFOL 10 MG/ML IV BOLUS
INTRAVENOUS | Status: DC | PRN
  Administered 2021-12-22: 60 mg via INTRAVENOUS

## 2021-12-22 MED ORDER — GLYCOPYRROLATE 0.2 MG/ML IJ SOLN
INTRAMUSCULAR | Status: DC | PRN
  Administered 2021-12-22: .1 mg via INTRAVENOUS

## 2021-12-22 MED ORDER — GLYCOPYRROLATE 0.2 MG/ML IJ SOLN
INTRAMUSCULAR | Status: AC
  Filled 2021-12-22: qty 1

## 2021-12-22 MED ORDER — PROPOFOL 500 MG/50ML IV EMUL
INTRAVENOUS | Status: DC | PRN
  Administered 2021-12-22: 150 ug/kg/min via INTRAVENOUS

## 2021-12-22 MED ORDER — SODIUM CHLORIDE 0.9 % IV SOLN: INTRAVENOUS | Status: DC

## 2021-12-22 MED ORDER — SODIUM CHLORIDE 0.9 % IV SOLN: INTRAVENOUS | Status: DC | PRN

## 2021-12-22 NOTE — Anesthesia Preprocedure Evaluation (Addendum)
Anesthesia Evaluation  Patient identified by MRN, date of birth, ID band Patient awake    Reviewed: Allergy & Precautions, NPO status , Patient's Chart, lab work & pertinent test results  History of Anesthesia Complications Negative for: history of anesthetic complications  Airway Mallampati: IV   Neck ROM: Full    Dental  (+) Edentulous Upper, Edentulous Lower   Pulmonary Current Smoker (1/3 - 1/2 ppd) and Patient abstained from smoking.,    Pulmonary exam normal breath sounds clear to auscultation       Cardiovascular hypertension, Normal cardiovascular exam Rhythm:Regular Rate:Normal  ECG 11/30/21:  Normal sinus rhythm Septal infarct , age undetermined   Neuro/Psych Hx cerebral aneurysm s/p surgery 1981    GI/Hepatic negative GI ROS,   Endo/Other  diabetes, Type 2  Renal/GU negative Renal ROS     Musculoskeletal  (+) Arthritis ,   Abdominal   Peds  Hematology Breast CA   Anesthesia Other Findings   Reproductive/Obstetrics                            Anesthesia Physical Anesthesia Plan  ASA: 2  Anesthesia Plan: General   Post-op Pain Management:    Induction: Intravenous  PONV Risk Score and Plan: 2 and Propofol infusion, TIVA and Treatment may vary due to age or medical condition  Airway Management Planned: Natural Airway  Additional Equipment:   Intra-op Plan:   Post-operative Plan:   Informed Consent: I have reviewed the patients History and Physical, chart, labs and discussed the procedure including the risks, benefits and alternatives for the proposed anesthesia with the patient or authorized representative who has indicated his/her understanding and acceptance.       Plan Discussed with: CRNA  Anesthesia Plan Comments: (LMA/GETA backup discussed.  Patient consented for risks of anesthesia including but not limited to:  - adverse reactions to medications -  damage to eyes, teeth, lips or other oral mucosa - nerve damage due to positioning  - sore throat or hoarseness - damage to heart, brain, nerves, lungs, other parts of body or loss of life  Informed patient about role of CRNA in peri- and intra-operative care.  Patient voiced understanding.)        Anesthesia Quick Evaluation

## 2021-12-22 NOTE — Op Note (Signed)
[facility]astroenterology Patient Name: Jessica Compton Procedure Date: 12/22/2021 8:34 AM MRN: [id number] Account #: [id number] Date of Birth: Apr 30, 1941 Admit Type: Outpatient Age: 80 Room: [facility] ENDO ROOM 3 Gender: Female Note Status: Finalized Instrument Name: Park Meo [id number] Procedure:             Colonoscopy Indications:           Abnormal PET scan of the GI tract Providers:             Jonathon Bellows MD, MD Referring MD:          No Local Md, MD (Referring MD) Medicines:             Monitored Anesthesia Care Complications:         No immediate complications. Procedure:             Pre-Anesthesia Assessment:                        - Prior to the procedure, a History and Physical was                         performed, and patient medications, allergies and                         sensitivities were reviewed. The patient's tolerance                         of previous anesthesia was reviewed.                        - The risks and benefits of the procedure and the                         sedation options and risks were discussed with the                         patient. All questions were answered and informed                         consent was obtained.                        - ASA Grade Assessment: III - A patient with severe                         systemic disease.                        After obtaining informed consent, the colonoscope was                         passed under direct vision. Throughout the procedure,                         the patient's blood pressure, pulse, and oxygen                         saturations were monitored continuously. The  Colonoscope was introduced through the anus and                         advanced to the the cecum, identified by the                         appendiceal orifice. The patient tolerated the                         procedure well. The quality of the bowel preparation                          was excellent. The colonoscopy was somewhat difficult                         due to restricted mobility of the colon. Successful                         completion of the procedure was aided by withdrawing                         the scope and replacing with the pediatric colonoscope. Findings:      The perianal and digital rectal examinations were normal.      A 5 mm polyp was found in the transverse colon. The polyp was sessile.       The polyp was removed with a cold snare. Resection and retrieval were       complete.      A 4 mm polyp was found in the cecum. The polyp was sessile. The polyp       was removed with a cold biopsy forceps. Resection and retrieval were       complete. Impression:            - One 5 mm polyp in the transverse colon, removed with                         a cold snare. Resected and retrieved.                        - One 4 mm polyp in the cecum, removed with a cold                         biopsy forceps. Resected and retrieved. Recommendation:        - Await pathology results. Procedure Code(s):     --- Professional ---                        [id number], Colonoscopy, flexible; with removal of                         tumor(s), polyp(s), or other lesion(s) by snare                         technique                        32202, 59, Colonoscopy, flexible; with biopsy, single  or multiple Diagnosis Code(s):     --- Professional ---                        K63.5, Polyp of colon                        R93.3, Abnormal findings on diagnostic imaging of                         other parts of digestive tract CPT copyright 2019 American Medical Association. All rights reserved. The codes documented in this report are preliminary and upon coder review may  be revised to meet current compliance requirements. Jonathon Bellows, MD Jonathon Bellows MD, MD 12/22/2021 9:13:51 AM This report has been signed electronically. Number of Addenda: 0 Note Initiated  On: 12/22/2021 8:34 AM Scope Withdrawal Time: 0 hours 7 minutes 52 seconds  Total Procedure Duration: 0 hours 21 minutes 54 seconds  Estimated Blood Loss:  Estimated blood loss: none.      [facility]

## 2021-12-22 NOTE — Anesthesia Postprocedure Evaluation (Signed)
Anesthesia Post Note  Patient: Jessica Compton  Procedure(s) Performed: COLONOSCOPY WITH PROPOFOL  Patient location during evaluation: PACU Anesthesia Type: General Level of consciousness: awake and alert, oriented and patient cooperative Pain management: pain level controlled Vital Signs Assessment: post-procedure vital signs reviewed and stable Respiratory status: spontaneous breathing, nonlabored ventilation and respiratory function stable Cardiovascular status: blood pressure returned to baseline and stable Postop Assessment: adequate PO intake Anesthetic complications: no   No notable events documented.   Last Vitals:  Vitals:   12/22/21 0932 12/22/21 0942  BP: (!) 168/80 (!) 180/84  Pulse: 80 75  Resp: 20 (!) 21  Temp:    SpO2: 98% 98%    Last Pain:  Vitals:   12/22/21 0942  TempSrc:   PainSc: 0-No pain                 Darrin Nipper

## 2021-12-22 NOTE — H&P (Signed)
Jessica Bellows, MD [address], [place], [place], [place], [address] [address], [place], [place], [place], [address] Phone: [phone]  Fax: [phone]  Primary Care Physician:  [name], MD   Pre-Procedure History & Physical: HPI:  Jessica Compton is a 80 y.o. female is here for an colonoscopy.   Past Medical History:  Diagnosis Date   Aneurysm ([place]) 2007   Arthritis    Cancer ([place]) 2007   diagnosed 01/07 left breast with simple mastectomu & sn bx. The pt had a 3.5cm tumor. Frozen section report on the 2 sn were negative. On permanent sections a 0.20m micrometastasis was identified. She was not felt to require a complete axillary dissection. She has completed her Tamoxifen therapy and has been released from routine f/u with medical oncology service.   COPD (chronic obstructive pulmonary disease) (HCC)    Diabetes mellitus without complication ([place]    Hyperlipidemia    Hypertension    since age 80  Malignant ne[place] of upper-outer quadrant of female breast ([place] 12/2013   Mastectomy site recurrence resected 01/26/2014, ER 90%, PR 0%, HER-2/neu nonamplified.   Other benign neoplasm of connective and other soft tissue of trunk, unspecified 2014   Personal history of colonic polyps    Personal history of tobacco use, presenting hazards to health    Special screening for malignant neoplasms, colon    Unspecified disorder of skin and subcutaneous tissue 02/19/2013   biopsy of skin over the sternum on the right breast showed hypertrophic scar,keloid formation.    Past Surgical History:  Procedure Laterality Date   ABDOMINAL HYSTERECTOMY  2004   total   BREAST BIOPSY Right January 26, 2014   Core biopsy for mild increase breast uptake on PET scan, usual ductal hyperplasia and stromal fibrosis   BREAST SURGERY Left 2007   mastectomy   chest wall mass  01/26/14   CHOLECYSTECTOMY  2007   COLONOSCOPY W/ POLYPECTOMY  2011   Dr. EBrynda Greathouse  EYE SURGERY Right     cataract surgery   head surgery  1991   MASTECTOMY Left 2007   PORT-A-CATH REMOVAL  2008   PORTACATH PLACEMENT  2007    Prior to Admission medications   Medication Sig Start Date End Date Taking? Authorizing Provider  amLODipine (NORVASC) 10 MG tablet TAKE 1 TABLET(10 MG) BY MOUTH DAILY 09/12/21  Yes BCammie Sickle MD  atorvastatin (LIPITOR) 20 MG tablet Take 20 mg by mouth daily at 6 PM. 08/14/14  Yes [provider]  capecitabine (XELODA) 500 MG tablet Take 3 tablets (1,500 mg total) by mouth 2 (two) times daily after a meal. Take for 14 days, then hold for 7 days. Repeat every 21 days. Start on 12/15/21. 12/05/21  Yes AVerlon Au NP  citalopram (CELEXA) 40 MG tablet Take 1 tablet (40 mg total) by mouth daily. 08/26/21  Yes BCammie Sickle MD  lisinopril (ZESTRIL) 2.5 MG tablet Take 1 tablet (2.5 mg total) by mouth daily. 10/28/20  Yes BCammie Sickle MD  Calcium Carbonate-Vitamin D 600-200 MG-UNIT CAPS Take 200 capsules by mouth once. 07/20/15   [provider]  meloxicam (MOBIC) 15 MG tablet Take 1 tablet (15 mg total) by mouth daily. 11/30/21   BCammie Sickle MD    Allergies as of 11/29/2021 - Review Complete 11/29/2021  Allergen Reaction Noted   Metoprolol  09/08/2014    Family History  Problem Relation Age of Onset   Cancer  Other        ovarian,breast,colon cancers;relations not listed   Breast cancer Neg Hx     Social History   Socioeconomic History   Marital status: Single    Spouse name: Not on file   Number of children: Not on file   Years of education: Not on file   Highest education level: Not on file  Occupational History   Not on file  Tobacco Use   Smoking status: Some Days    Packs/day: 1.00    Years: 50.00    Pack years: 50.00    Types: Cigarettes   Smokeless tobacco: Never  Vaping Use   Vaping Use: Some days  Substance and Sexual Activity   Alcohol use: No    Alcohol/week: 0.0 standard drinks    Drug use: No   Sexual activity: Not on file  Other Topics Concern   Not on file  Social History Narrative   Not on file   Social Determinants of Health   Financial Resource Strain: Not on file  Food Insecurity: Not on file  Transportation Needs: Not on file  Physical Activity: Not on file  Stress: Not on file  Social Connections: Not on file  Intimate Partner Violence: Not on file    Review of Systems: See HPI, otherwise negative ROS  Physical Exam: BP (!) 174/89    Pulse 88    Temp (!) 97 F (36.1 C) (Temporal)    Resp 18    Ht 5' 5"  (1.651 m)    Wt 69.4 kg    SpO2 100%    BMI 25.46 kg/m  General:   Alert,  pleasant and cooperative in NAD Head:  Normocephalic and atraumatic. Neck:  Supple; no masses or thyromegaly. Lungs:  Clear throughout to auscultation, normal respiratory effort.    Heart:  +S1, +S2, Regular rate and rhythm, No edema. Abdomen:  Soft, nontender and nondistended. Normal bowel sounds, without guarding, and without rebound.   Neurologic:  Alert and  oriented x4;  grossly normal neurologically.  Impression/Plan: [name] is here for an colonoscopy to be performed for abnormal PET scan  Risks, benefits, limitations, and alternatives regarding  colonoscopy have been reviewed with the patient.  Questions have been answered.  All parties agreeable.   Jessica Bellows, MD  12/22/2021, 8:29 AM

## 2021-12-22 NOTE — Transfer of Care (Signed)
Immediate Anesthesia Transfer of Care Note  Patient: Jessica Compton  Procedure(s) Performed: COLONOSCOPY WITH PROPOFOL  Patient Location: Endoscopy Unit  Anesthesia Type:General  Level of Consciousness: drowsy  Airway & Oxygen Therapy: Patient Spontanous Breathing  Post-op Assessment: Report given to RN and Post -op Vital signs reviewed and stable  Post vital signs: Reviewed and stable  Last Vitals:  Vitals Value Taken Time  BP 127/63 12/22/21 0912  Temp 36.1 C 12/22/21 0912  Pulse 90 12/22/21 0912  Resp 24 12/22/21 0912  SpO2 100 % 12/22/21 0912  Vitals shown include unvalidated device data.  Last Pain:  Vitals:   12/22/21 0912  TempSrc: Tympanic  PainSc: Asleep         Complications: No notable events documented.

## 2021-12-23 LAB — SURGICAL PATHOLOGY

## 2021-12-28 ENCOUNTER — Other Ambulatory Visit: Payer: Self-pay | Admitting: Pharmacist

## 2021-12-28 ENCOUNTER — Ambulatory Visit: Payer: Medicare HMO | Admitting: Pharmacist

## 2021-12-28 ENCOUNTER — Inpatient Hospital Stay: Payer: Medicare HMO | Admitting: Pharmacist

## 2021-12-28 ENCOUNTER — Inpatient Hospital Stay (HOSPITAL_BASED_OUTPATIENT_CLINIC_OR_DEPARTMENT_OTHER): Payer: Medicare HMO | Admitting: Internal Medicine

## 2021-12-28 ENCOUNTER — Inpatient Hospital Stay: Payer: Medicare HMO

## 2021-12-28 ENCOUNTER — Other Ambulatory Visit: Payer: Medicare HMO

## 2021-12-28 ENCOUNTER — Other Ambulatory Visit: Payer: Self-pay

## 2021-12-28 ENCOUNTER — Ambulatory Visit: Payer: Medicare HMO | Admitting: Internal Medicine

## 2021-12-28 ENCOUNTER — Inpatient Hospital Stay: Payer: Medicare HMO | Attending: Internal Medicine

## 2021-12-28 ENCOUNTER — Encounter: Payer: Self-pay | Admitting: Internal Medicine

## 2021-12-28 VITALS — BP 184/87 | HR 75 | Temp 98.0°F | Ht 65.0 in | Wt 155.0 lb

## 2021-12-28 DIAGNOSIS — Z23 Encounter for immunization: Secondary | ICD-10-CM

## 2021-12-28 DIAGNOSIS — C50812 Malignant neoplasm of overlapping sites of left female breast: Secondary | ICD-10-CM

## 2021-12-28 DIAGNOSIS — Z170 Estrogen receptor positive status [ER+]: Secondary | ICD-10-CM

## 2021-12-28 DIAGNOSIS — Z79899 Other long term (current) drug therapy: Secondary | ICD-10-CM | POA: Insufficient documentation

## 2021-12-28 DIAGNOSIS — I159 Secondary hypertension, unspecified: Secondary | ICD-10-CM

## 2021-12-28 LAB — CBC WITH DIFFERENTIAL/PLATELET
Abs Immature Granulocytes: 0.01 10*3/uL (ref 0.00–0.07)
Basophils Absolute: 0 10*3/uL (ref 0.0–0.1)
Basophils Relative: 1 %
Eosinophils Absolute: 0.2 10*3/uL (ref 0.0–0.5)
Eosinophils Relative: 3 %
HCT: 34.2 % — ABNORMAL LOW (ref 36.0–46.0)
Hemoglobin: 11 g/dL — ABNORMAL LOW (ref 12.0–15.0)
Immature Granulocytes: 0 %
Lymphocytes Relative: 43 %
Lymphs Abs: 2.8 10*3/uL (ref 0.7–4.0)
MCH: 30.8 pg (ref 26.0–34.0)
MCHC: 32.2 g/dL (ref 30.0–36.0)
MCV: 95.8 fL (ref 80.0–100.0)
Monocytes Absolute: 0.8 10*3/uL (ref 0.1–1.0)
Monocytes Relative: 12 %
Neutro Abs: 2.6 10*3/uL (ref 1.7–7.7)
Neutrophils Relative %: 41 %
Platelets: 200 10*3/uL (ref 150–400)
RBC: 3.57 MIL/uL — ABNORMAL LOW (ref 3.87–5.11)
RDW: 16.1 % — ABNORMAL HIGH (ref 11.5–15.5)
WBC: 6.3 10*3/uL (ref 4.0–10.5)
nRBC: 0 % (ref 0.0–0.2)

## 2021-12-28 LAB — COMPREHENSIVE METABOLIC PANEL
ALT: 15 U/L (ref 0–44)
AST: 29 U/L (ref 15–41)
Albumin: 4 g/dL (ref 3.5–5.0)
Alkaline Phosphatase: 109 U/L (ref 38–126)
Anion gap: 8 (ref 5–15)
BUN: 16 mg/dL (ref 8–23)
CO2: 24 mmol/L (ref 22–32)
Calcium: 8.7 mg/dL — ABNORMAL LOW (ref 8.9–10.3)
Chloride: 102 mmol/L (ref 98–111)
Creatinine, Ser: 1.24 mg/dL — ABNORMAL HIGH (ref 0.44–1.00)
GFR, Estimated: 44 mL/min — ABNORMAL LOW (ref >60)
Glucose, Bld: 129 mg/dL — ABNORMAL HIGH (ref 70–99)
Potassium: 3.6 mmol/L (ref 3.5–5.1)
Sodium: 134 mmol/L — ABNORMAL LOW (ref 135–145)
Total Bilirubin: 0.8 mg/dL (ref 0.3–1.2)
Total Protein: 7.7 g/dL (ref 6.5–8.1)

## 2021-12-28 MED ORDER — INFLUENZA VAC A&B SA ADJ QUAD 0.5 ML IM PRSY: 0.5000 mL | PREFILLED_SYRINGE | Freq: Once | INTRAMUSCULAR | Status: DC

## 2021-12-28 MED ORDER — CAPECITABINE 500 MG PO TABS
1500.0000 mg | ORAL_TABLET | Freq: Two times a day (BID) | ORAL | 1 refills | Status: DC
  Filled 2021-12-28: qty 84, 14d supply, fill #0
  Filled 2021-12-29: qty 84, 21d supply, fill #0
  Filled 2022-02-07: qty 84, 21d supply, fill #1

## 2021-12-28 MED ORDER — INFLUENZA VAC A&B SA ADJ QUAD 0.5 ML IM PRSY
0.5000 mL | PREFILLED_SYRINGE | Freq: Once | INTRAMUSCULAR | Status: AC
  Administered 2021-12-28: 0.5 mL via INTRAMUSCULAR
  Filled 2021-12-28: qty 0.5

## 2021-12-28 NOTE — Progress Notes (Signed)
[place] OFFICE PROGRESS NOTE  Patient Care Team: [name], MD as PCP - General (Internal Medicine) Bary Castilla, Forest Gleason, MD (General Surgery) Marden Noble, MD (Internal Medicine) [name], MD as Consulting Physician (Internal Medicine)   SUMMARY OF ONCOLOGIC HISTORY:  Oncology History Overview Note  # 2007- LEFT BREAST CA STAGE II [T2N71m; s/p mastec; Dr.Byrnett] ER-Pos/PR Neg; Her- 2 Neu- NEG; ACx4-Taxol x12; Femara; stopped May 2009-stopped follow up.  #JAN 2015- Post mastectomy Chest wall recurrence [chest wall mass bx-]; ER- 90%; PR- NEG; Her2 Neu- NEG; s/p excision [involving skeletal muscle/adipose tissue; clear margins]; CT July 2017- NED;   # Jan 2020- progression/ hilar- START abema [jan 14th]+ faslodex[jan 13th]; OCT-NOV 2022-PET scan shows progressive disease in the bones to liver.  Discontinue Abema+ Faslodex  # DEC 1st, 2022- START Xeloda 2w/1w  # DEC 2021- anemia/? CKD [[email or web address]];  # OSTEOPENIA [BMD- June 2016]  # Kidney cysts  DIAGNOSIS: LEFT BREAST CA  STAGE: IV- NED  ;GOALS: pallaitive  CURRENT/MOST RECENT THERAPY:     Breast cancer metastasized to skin ([facility]  02/05/2014 Initial Diagnosis   Breast cancer metastasized to skin ([facility]   Malignant neoplasm of overlapping sites of left breast in female, estrogen receptor positive ([facility]  07/24/2016 Initial Diagnosis   Cancer of overlapping sites of left female breast ([facility]   01/06/2019 -  Chemotherapy   The patient had fulvestrant (FASLODEX) injection 500 mg, 500 mg, Intramuscular,  Once, 1 of 4 cycles Dose modification: 500 mg (original dose 500 mg, Cycle 1) Administration: 500 mg (08/05/2020)   for chemotherapy treatment.      INTERVAL HISTORY: Accompanied by sister, robin.; Ambulating independently.  A very pleasant 81 year old African-American female patient with above history of ER/PR positive breast cancer stage IV currently on Xeloda is here for  follow-up.  Patient has been on Xeloda for the last 1 month.  Denies any worsening diarrhea. Denies any diarrhea.  No rash on palms and soles. Denies any worsening shortness of breath or cough.  No fever no chills.  Chronic mild joint pains not any worse.   Review of Systems  Constitutional:  Positive for malaise/fatigue. Negative for chills, diaphoresis, fever and weight loss.  HENT:  Negative for nosebleeds and sore throat.   Eyes:  Negative for double vision.  Respiratory:  Negative for cough, hemoptysis, sputum production, shortness of breath and wheezing.   Cardiovascular:  Negative for chest pain, palpitations, orthopnea and leg swelling.  Gastrointestinal:  Negative for abdominal pain, blood in stool, constipation, diarrhea, heartburn, melena, nausea and vomiting.  Musculoskeletal:  Positive for back pain and joint pain.  Skin: Negative.  Negative for itching and rash.  Neurological:  Negative for tingling, focal weakness, weakness and headaches.  Endo/Heme/Allergies:  Does not bruise/bleed easily.  Psychiatric/Behavioral:  Negative for depression. The patient is not nervous/anxious and does not have insomnia.   PAST MEDICAL HISTORY :  Past Medical History:  Diagnosis Date   Aneurysm ([facility] 2007   Arthritis    Cancer ([facility] 2007   diagnosed 01/07 left breast with simple mastectomu & sn bx. The pt had a 3.5cm tumor. Frozen section report on the 2 sn were negative. On permanent sections a 0.579mmicrometastasis was identified. She was not felt to require a complete axillary dissection. She has completed her Tamoxifen therapy and has been released from routine f/u with medical oncology service.   COPD (chronic obstructive pulmonary disease) (HCC)    Diabetes mellitus without complication (  [place])    Hyperlipidemia    Hypertension    since age 45   Malignant neoplasm of upper-outer quadrant of female breast ([place]) 12/2013   Mastectomy site recurrence resected 01/26/2014, ER 90%, PR 0%,  HER-2/neu nonamplified.   Other benign neoplasm of connective and other soft tissue of trunk, unspecified 2014   Personal history of colonic polyps    Personal history of tobacco use, presenting hazards to health    Special screening for malignant neoplasms, colon    Unspecified disorder of skin and subcutaneous tissue 02/19/2013   biopsy of skin over the sternum on the right breast showed hypertrophic scar,keloid formation.    PAST SURGICAL HISTORY :   Past Surgical History:  Procedure Laterality Date   ABDOMINAL HYSTERECTOMY  2004   total   BREAST BIOPSY Right January 26, 2014   Core biopsy for mild increase breast uptake on PET scan, usual ductal hyperplasia and stromal fibrosis   BREAST SURGERY Left 2007   mastectomy   chest wall mass  01/26/14   CHOLECYSTECTOMY  2007   COLONOSCOPY W/ POLYPECTOMY  2011   Dr. Brynda Greathouse   COLONOSCOPY WITH PROPOFOL N/A 12/22/2021   Procedure: COLONOSCOPY WITH PROPOFOL;  Surgeon: Jonathon Bellows, MD;  Location: [facility] ENDOSCOPY;  Service: Gastroenterology;  Laterality: N/A;   EYE SURGERY Right    cataract surgery   head surgery  1991   MASTECTOMY Left 2007   PORT-A-CATH REMOVAL  2008   PORTACATH PLACEMENT  2007    FAMILY HISTORY :   Family History  Problem Relation Age of Onset   Cancer Other        ovarian,breast,colon cancers;relations not listed   Breast cancer Neg Hx     SOCIAL HISTORY:   Social History   Tobacco Use   Smoking status: Some Days    Packs/day: 1.00    Years: 50.00    Pack years: 50.00    Types: Cigarettes   Smokeless tobacco: Never  Vaping Use   Vaping Use: Some days  Substance Use Topics   Alcohol use: No    Alcohol/week: 0.0 standard drinks   Drug use: No    ALLERGIES:  is allergic to metoprolol.  MEDICATIONS:  Current Outpatient Medications  Medication Sig Dispense Refill   amLODipine (NORVASC) 10 MG tablet TAKE 1 TABLET(10 MG) BY MOUTH DAILY 90 tablet 1   atorvastatin (LIPITOR) 20 MG tablet Take 20 mg by  mouth daily at 6 PM.     Calcium Carbonate-Vitamin D 600-200 MG-UNIT CAPS Take 200 capsules by mouth once.     citalopram (CELEXA) 40 MG tablet Take 1 tablet (40 mg total) by mouth daily. 90 tablet 1   lisinopril (ZESTRIL) 2.5 MG tablet Take 1 tablet (2.5 mg total) by mouth daily. 90 tablet 1   meloxicam (MOBIC) 15 MG tablet Take 1 tablet (15 mg total) by mouth daily. 60 tablet 1   capecitabine (XELODA) 500 MG tablet Take 3 tablets (1,500 mg total) by mouth 2 (two) times daily after a meal. Take for 14 days, then hold for 7 days. Repeat every 21 days. Start on 12/15/21. 84 tablet 1   No current facility-administered medications for this visit.    PHYSICAL EXAMINATION: ECOG PERFORMANCE STATUS: 0 - Asymptomatic  BP (!) 184/87 (BP Location: Right Arm, Patient Position: Sitting, Cuff Size: Normal)    Pulse 75    Temp 98 F (36.7 C) (Tympanic)    Ht 5' 5"  (1.651 m)    Wt 155 lb (  70.3 kg)    SpO2 100%    BMI 25.79 kg/m   Filed Weights   12/28/21 1505  Weight: 155 lb (70.3 kg)     Physical Exam HENT:     Head: Normocephalic and atraumatic.     Mouth/Throat:     Pharynx: No oropharyngeal exudate.  Eyes:     Pupils: Pupils are equal, round, and reactive to light.  Cardiovascular:     Rate and Rhythm: Normal rate and regular rhythm.  Pulmonary:     Effort: No respiratory distress.     Breath sounds: No wheezing.  Abdominal:     General: Bowel sounds are normal. There is no distension.     Palpations: Abdomen is soft. There is no mass.     Tenderness: There is no abdominal tenderness. There is no guarding or rebound.  Musculoskeletal:        General: No tenderness. Normal range of motion.     Cervical back: Normal range of motion and neck supple.  Skin:    General: Skin is warm.  Neurological:     Mental Status: She is alert and oriented to person, place, and time.  Psychiatric:        Mood and Affect: Affect normal.   LABORATORY DATA:  I have reviewed the data as listed     Component Value Date/Time   NA 134 (L) 12/28/2021 1455   NA 139 05/30/2014 0127   K 3.6 12/28/2021 1455   K 3.7 05/30/2014 0127   CL 102 12/28/2021 1455   CL 106 05/30/2014 0127   CO2 24 12/28/2021 1455   CO2 25 05/30/2014 0127   GLUCOSE 129 (H) 12/28/2021 1455   GLUCOSE 136 (H) 05/30/2014 0127   BUN 16 12/28/2021 1455   BUN 19 (H) 05/30/2014 0127   CREATININE 1.24 (H) 12/28/2021 1455   CREATININE 0.98 05/30/2014 0127   CALCIUM 8.7 (L) 12/28/2021 1455   CALCIUM 9.0 05/30/2014 0127   PROT 7.7 12/28/2021 1455   PROT 7.8 05/08/2014 1131   ALBUMIN 4.0 12/28/2021 1455   ALBUMIN 3.7 05/08/2014 1131   AST 29 12/28/2021 1455   AST 20 05/08/2014 1131   ALT 15 12/28/2021 1455   ALT 18 05/08/2014 1131   ALKPHOS 109 12/28/2021 1455   ALKPHOS 58 05/08/2014 1131   BILITOT 0.8 12/28/2021 1455   BILITOT 0.4 05/08/2014 1131   GFRNONAA 44 (L) 12/28/2021 1455   GFRNONAA 58 (L) 05/30/2014 0127   GFRAA 47 (L) 09/02/2020 0920   GFRAA >60 05/30/2014 0127    No results found for: SPEP, UPEP  Lab Results  Component Value Date   WBC 6.3 12/28/2021   NEUTROABS 2.6 12/28/2021   HGB 11.0 (L) 12/28/2021   HCT 34.2 (L) 12/28/2021   MCV 95.8 12/28/2021   PLT 200 12/28/2021      Chemistry      Component Value Date/Time   NA 134 (L) 12/28/2021 1455   NA 139 05/30/2014 0127   K 3.6 12/28/2021 1455   K 3.7 05/30/2014 0127   CL 102 12/28/2021 1455   CL 106 05/30/2014 0127   CO2 24 12/28/2021 1455   CO2 25 05/30/2014 0127   BUN 16 12/28/2021 1455   BUN 19 (H) 05/30/2014 0127   CREATININE 1.24 (H) 12/28/2021 1455   CREATININE 0.98 05/30/2014 0127      Component Value Date/Time   CALCIUM 8.7 (L) 12/28/2021 1455   CALCIUM 9.0 05/30/2014 0127   ALKPHOS 109 12/28/2021 1455  ALKPHOS 58 05/08/2014 1131   AST 29 12/28/2021 1455   AST 20 05/08/2014 1131   ALT 15 12/28/2021 1455   ALT 18 05/08/2014 1131   BILITOT 0.8 12/28/2021 1455   BILITOT 0.4 05/08/2014 1131         RADIOGRAPHIC  STUDIES: I have personally reviewed the radiological images as listed and agreed with the findings in the report. No results found.   ASSESSMENT & PLAN:   Malignant neoplasm of overlapping sites of left breast in female, estrogen receptor positive ([place]) #Left breast ipsilateral chest wall recurrence stage IV [2015]; NOV 7th PET scan- New hypermetabolic lesions within the spine and RIGHT iliac bone consistent with progression metastatic skeletal disease;  Hypermetabolic lesion the anterior margin of the LEFT hepatic lobe consistent with hepatic metastasis. Nodules in the RIGHT lower lung without clear hypermetabolic activity. Focal hypermetabolic activity in the ascending colon [see below].  Discontinued Faslodex+ abema.  # Currently on xeloda [1551m BID; [2w;1w]-Started dec 1st. patient tolerating Xeloda well without any major side effects.-will start next ccyle on 1/19.  Discussed with AEbony Hail #Ascending colon mass-incidentally noted on PET scan.  S/p  Dr. AVicente Males  colonoscpy DEC 2022- NEGATIVE; reviewed small adenomas.   # Bilateral hip pain- arthritis-[s/p ortho evaluation]; on meloxicam.STABLE;  refilled.   # Mild anemia hemoglobin- 11-12/ CKD-STABLE;     # CKD- stage III- GFR 48-50-STABLE;    # Anxiety/depression- celexa- STABLE;    # DISPOSITION:  # flu shot today # follow up in 4 weeks--; MD; cbc/cmp;CEA/ca27-29; Dr.B         GCammie Sickle MD 12/30/2021 9:23 AM

## 2021-12-28 NOTE — Assessment & Plan Note (Addendum)
#  Left breast ipsilateral chest wall recurrence stage IV [2015]; NOV 7th PET scan- New hypermetabolic lesions within the spine and RIGHT iliac bone consistent with progression metastatic skeletal disease;  Hypermetabolic lesion the anterior margin of the LEFT hepatic lobe consistent with hepatic metastasis. Nodules in the RIGHT lower lung without clear hypermetabolic activity. Focal hypermetabolic activity in the ascending colon [see below].  Discontinued Faslodex+ abema.  # Currently on xeloda [1500mg  BID; [2w;1w]-Started dec 1st. patient tolerating Xeloda well without any major side effects.-will start next ccyle on 1/19.  Discussed with Ebony Hail  #Ascending colon mass-incidentally noted on PET scan.  S/p  Dr. Vicente Males;  colonoscpy DEC 2022- NEGATIVE; reviewed small adenomas.   # Bilateral hip pain- arthritis-[s/p ortho evaluation]; on meloxicam.STABLE;  refilled.   # Mild anemia hemoglobin- 11-12/ CKD-STABLE;     # CKD- stage III- GFR 48-50-STABLE;    # Anxiety/depression- celexa- STABLE;    # DISPOSITION:  # flu shot today # follow up in 4 weeks--; MD; cbc/cmp;CEA/ca27-29; Dr.B

## 2021-12-28 NOTE — Progress Notes (Signed)
[facility]  [phone] [phone]  Patient Care Team: [name], MD as PCP - General (Internal Medicine) Bary Castilla, Forest Gleason, MD (General Surgery) Marden Noble, MD (Internal Medicine) [name], MD as Consulting Physician (Internal Medicine)   Name of the patient: Jessica Compton  [id number]  Feb 09, 1941   Date of visit: 12/28/21  HPI: Patient is a 81 y.o. female with progressive metastatic breast cancer. Patient was previously on abemaciclib and fulvestrant, but due to disease progression patient was transitioned to treatment with Xeloda (capecitabine). She started her capecitabine on 11/23/21.  Reason for Consult: Oral chemotherapy follow-up for capecitabine therapy.   PAST MEDICAL HISTORY: Past Medical History:  Diagnosis Date   Aneurysm ([place]) 2007   Arthritis    Cancer ([place]) 2007   diagnosed 01/07 left breast with simple mastectomu & sn bx. The pt had a 3.5cm tumor. Frozen section report on the 2 sn were negative. On permanent sections a 0.92m micrometastasis was identified. She was not felt to require a complete axillary dissection. She has completed her Tamoxifen therapy and has been released from routine f/u with medical oncology service.   COPD (chronic obstructive pulmonary disease) (HCC)    Diabetes mellitus without complication ([facility]    Hyperlipidemia    Hypertension    since age 89  Malignant neoplasm of upper-outer quadrant of female breast ([facility] 12/2013   Mastectomy site recurrence resected 01/26/2014, ER 90%, PR 0%, HER-2/neu nonamplified.   Other benign neoplasm of connective and other soft tissue of trunk, unspecified 2014   Personal history of colonic polyps    Personal history of tobacco use, presenting hazards to health    Special screening for malignant neoplasms, colon    Unspecified disorder of skin and subcutaneous tissue 02/19/2013   biopsy of skin over the sternum on  the right breast showed hypertrophic scar,keloid formation.    HEMATOLOGY/ONCOLOGY HISTORY:  Oncology History Overview Note  # 2007- LEFT BREAST CA STAGE II [T2N149m s/p mastec; Dr.Byrnett] ER-Pos/PR Neg; Her- 2 Neu- NEG; ACx4-Taxol x12; Femara; stopped May 2009-stopped follow up.  #JAN 2015- Post mastectomy Chest wall recurrence [chest wall mass bx-]; ER- 90%; PR- NEG; Her2 Neu- NEG; s/p excision [involving skeletal muscle/adipose tissue; clear margins]; CT July 2017- NED;   # Jan 2020- progression/ hilar- START abema [jan 14th]+ faslodex[jan 13th]; OCT-NOV 2022-PET scan shows progressive disease in the bones to liver.  Discontinue Abema+ Faslodex  # DEC 1st, 2022- START Xeloda 2w/1w  # DEC 2021- anemia/? CKD [[facility];  # OSTEOPENIA [BMD- June 2016]  # Kidney cysts  DIAGNOSIS: LEFT BREAST CA  STAGE: IV- NED  ;GOALS: pallaitive  CURRENT/MOST RECENT THERAPY:     Breast cancer metastasized to skin ([facility] 02/05/2014 Initial Diagnosis   Breast cancer metastasized to skin ([facility]  Malignant neoplasm of overlapping sites of left breast in female, estrogen receptor positive ([facility] 07/24/2016 Initial Diagnosis   Cancer of overlapping sites of left female breast ([facility]  01/06/2019 -  Chemotherapy   The patient had fulvestrant (FASLODEX) injection 500 mg, 500 mg, Intramuscular,  Once, 1 of 4 cycles Dose modification: 500 mg (original dose 500 mg, Cycle 1) Administration: 500 mg (08/05/2020)   for chemotherapy treatment.       ALLERGIES:  is allergic to metoprolol.  MEDICATIONS:  Current Outpatient Medications  Medication Sig Dispense Refill   amLODipine (NORVASC) 10 MG tablet TAKE 1 TABLET(10 MG) BY MOUTH DAILY 90 tablet  1   atorvastatin (LIPITOR) 20 MG tablet Take 20 mg by mouth daily at 6 PM.     Calcium Carbonate-Vitamin D 600-200 MG-UNIT CAPS Take 200 capsules by mouth once.     capecitabine (XELODA) 500 MG tablet Take 3 tablets (1,500 mg total) by mouth 2 (two) times daily  after a meal. Take for 14 days, then hold for 7 days. Repeat every 21 days. Start on 12/15/21. 84 tablet 0   citalopram (CELEXA) 40 MG tablet Take 1 tablet (40 mg total) by mouth daily. 90 tablet 1   lisinopril (ZESTRIL) 2.5 MG tablet Take 1 tablet (2.5 mg total) by mouth daily. 90 tablet 1   meloxicam (MOBIC) 15 MG tablet Take 1 tablet (15 mg total) by mouth daily. 60 tablet 1   No current facility-administered medications for this visit.    VITAL SIGNS: There were no vitals taken for this visit. There were no vitals filed for this visit.  Estimated body mass index is 25.79 kg/m as calculated from the following:   Height as of an earlier encounter on 12/28/21: 5' 5"  (1.651 m).   Weight as of an earlier encounter on 12/28/21: 70.3 kg (155 lb).  LABS: CBC:    Component Value Date/Time   WBC 6.3 12/28/2021 1455   HGB 11.0 (L) 12/28/2021 1455   HGB 11.8 (L) 05/30/2014 0127   HCT 34.2 (L) 12/28/2021 1455   HCT 37.3 05/30/2014 0127   PLT 200 12/28/2021 1455   PLT 193 05/30/2014 0127   MCV 95.8 12/28/2021 1455   MCV 90 05/30/2014 0127   NEUTROABS 2.6 12/28/2021 1455   NEUTROABS 5.6 05/30/2014 0127   LYMPHSABS 2.8 12/28/2021 1455   LYMPHSABS 1.7 05/30/2014 0127   MONOABS 0.8 12/28/2021 1455   MONOABS 0.7 05/30/2014 0127   EOSABS 0.2 12/28/2021 1455   EOSABS 0.1 05/30/2014 0127   BASOSABS 0.0 12/28/2021 1455   BASOSABS 0.0 05/30/2014 0127   Comprehensive Metabolic Panel:    Component Value Date/Time   NA 134 (L) 12/28/2021 1455   NA 139 05/30/2014 0127   K 3.6 12/28/2021 1455   K 3.7 05/30/2014 0127   CL 102 12/28/2021 1455   CL 106 05/30/2014 0127   CO2 24 12/28/2021 1455   CO2 25 05/30/2014 0127   BUN 16 12/28/2021 1455   BUN 19 (H) 05/30/2014 0127   CREATININE 1.24 (H) 12/28/2021 1455   CREATININE 0.98 05/30/2014 0127   GLUCOSE 129 (H) 12/28/2021 1455   GLUCOSE 136 (H) 05/30/2014 0127   CALCIUM 8.7 (L) 12/28/2021 1455   CALCIUM 9.0 05/30/2014 0127   AST 29 12/28/2021  1455   AST 20 05/08/2014 1131   ALT 15 12/28/2021 1455   ALT 18 05/08/2014 1131   ALKPHOS 109 12/28/2021 1455   ALKPHOS 58 05/08/2014 1131   BILITOT 0.8 12/28/2021 1455   BILITOT 0.4 05/08/2014 1131   PROT 7.7 12/28/2021 1455   PROT 7.8 05/08/2014 1131   ALBUMIN 4.0 12/28/2021 1455   ALBUMIN 3.7 05/08/2014 1131     Present during today's visit: patient and her daughter   Assessment and Plan: Continue capecitabine 1548m twice daily 14 days on/7 days off   Oral Chemotherapy Side Effect/Intolerance:  Not reports diarrhea, hand-foot syndrome, nausea, or edema  Oral Chemotherapy Adherence: no reported missed doses Ms. IDalbert Batmanpatient barriers to medication adherence identified.   New medications: none reported  Medication Access Issues: no issues, [facility]  expressed understanding and was in agreement with this plan. She also understands that She can call clinic at any time with any questions, concerns, or complaints.   Follow-up plan: RTC in 4 weeks  Thank you for allowing me to participate in the care of this very pleasant patient.   Time Total: 15 mins  Visit consisted of counseling and education on dealing with issues of symptom management in the setting of serious and potentially life-threatening illness.Greater than 50%  of this time was spent counseling and coordinating care related to the above assessment and plan.  Signed by: [name], PharmD, BCPS, Salley Slaughter, CPP Hematology/Oncology Clinical Pharmacist Practitioner [place]/DB/AP [facility] [phone]  12/28/2021 7:28 PM

## 2021-12-28 NOTE — Progress Notes (Signed)
Pt asking to get a flu vaccine today if possible.

## 2021-12-29 ENCOUNTER — Other Ambulatory Visit (HOSPITAL_COMMUNITY): Payer: Self-pay

## 2021-12-29 LAB — CANCER ANTIGEN 27.29: CA 27.29: 93.7 U/mL — ABNORMAL HIGH (ref 0.0–38.6)

## 2021-12-30 ENCOUNTER — Encounter: Payer: Self-pay | Admitting: Internal Medicine

## 2021-12-30 MED ORDER — LISINOPRIL 2.5 MG PO TABS: 2.5000 mg | ORAL_TABLET | Freq: Every day | ORAL | 1 refills | Status: DC

## 2022-01-02 ENCOUNTER — Encounter: Payer: Self-pay | Admitting: Gastroenterology

## 2022-01-02 ENCOUNTER — Ambulatory Visit: Payer: Medicare HMO | Admitting: Pharmacist

## 2022-01-02 ENCOUNTER — Other Ambulatory Visit: Payer: Medicare HMO

## 2022-01-02 ENCOUNTER — Ambulatory Visit: Payer: Medicare HMO | Admitting: Internal Medicine

## 2022-01-17 ENCOUNTER — Other Ambulatory Visit (HOSPITAL_COMMUNITY): Payer: Self-pay

## 2022-01-23 ENCOUNTER — Other Ambulatory Visit (HOSPITAL_COMMUNITY): Payer: Self-pay

## 2022-01-24 ENCOUNTER — Other Ambulatory Visit (HOSPITAL_COMMUNITY): Payer: Self-pay

## 2022-01-25 ENCOUNTER — Other Ambulatory Visit: Payer: Self-pay

## 2022-01-25 ENCOUNTER — Inpatient Hospital Stay (HOSPITAL_BASED_OUTPATIENT_CLINIC_OR_DEPARTMENT_OTHER): Payer: Medicare HMO | Admitting: Internal Medicine

## 2022-01-25 ENCOUNTER — Encounter: Payer: Self-pay | Admitting: Internal Medicine

## 2022-01-25 ENCOUNTER — Inpatient Hospital Stay: Payer: Medicare HMO | Attending: Internal Medicine

## 2022-01-25 DIAGNOSIS — Z79899 Other long term (current) drug therapy: Secondary | ICD-10-CM | POA: Insufficient documentation

## 2022-01-25 DIAGNOSIS — Z803 Family history of malignant neoplasm of breast: Secondary | ICD-10-CM | POA: Insufficient documentation

## 2022-01-25 DIAGNOSIS — C7951 Secondary malignant neoplasm of bone: Secondary | ICD-10-CM | POA: Diagnosis not present

## 2022-01-25 DIAGNOSIS — E1122 Type 2 diabetes mellitus with diabetic chronic kidney disease: Secondary | ICD-10-CM | POA: Diagnosis not present

## 2022-01-25 DIAGNOSIS — N183 Chronic kidney disease, stage 3 unspecified: Secondary | ICD-10-CM | POA: Diagnosis not present

## 2022-01-25 DIAGNOSIS — Z170 Estrogen receptor positive status [ER+]: Secondary | ICD-10-CM

## 2022-01-25 DIAGNOSIS — C50812 Malignant neoplasm of overlapping sites of left female breast: Secondary | ICD-10-CM

## 2022-01-25 DIAGNOSIS — C792 Secondary malignant neoplasm of skin: Secondary | ICD-10-CM | POA: Insufficient documentation

## 2022-01-25 DIAGNOSIS — M8580 Other specified disorders of bone density and structure, unspecified site: Secondary | ICD-10-CM | POA: Insufficient documentation

## 2022-01-25 DIAGNOSIS — C7989 Secondary malignant neoplasm of other specified sites: Secondary | ICD-10-CM | POA: Insufficient documentation

## 2022-01-25 DIAGNOSIS — I129 Hypertensive chronic kidney disease with stage 1 through stage 4 chronic kidney disease, or unspecified chronic kidney disease: Secondary | ICD-10-CM | POA: Diagnosis not present

## 2022-01-25 DIAGNOSIS — Z8719 Personal history of other diseases of the digestive system: Secondary | ICD-10-CM | POA: Insufficient documentation

## 2022-01-25 DIAGNOSIS — C787 Secondary malignant neoplasm of liver and intrahepatic bile duct: Secondary | ICD-10-CM | POA: Diagnosis not present

## 2022-01-25 DIAGNOSIS — F17210 Nicotine dependence, cigarettes, uncomplicated: Secondary | ICD-10-CM | POA: Insufficient documentation

## 2022-01-25 DIAGNOSIS — D649 Anemia, unspecified: Secondary | ICD-10-CM | POA: Insufficient documentation

## 2022-01-25 LAB — CBC WITH DIFFERENTIAL/PLATELET
Abs Immature Granulocytes: 0.01 10*3/uL (ref 0.00–0.07)
Basophils Absolute: 0 10*3/uL (ref 0.0–0.1)
Basophils Relative: 0 %
Eosinophils Absolute: 0.2 10*3/uL (ref 0.0–0.5)
Eosinophils Relative: 3 %
HCT: 34.3 % — ABNORMAL LOW (ref 36.0–46.0)
Hemoglobin: 11.2 g/dL — ABNORMAL LOW (ref 12.0–15.0)
Immature Granulocytes: 0 %
Lymphocytes Relative: 34 %
Lymphs Abs: 2.2 10*3/uL (ref 0.7–4.0)
MCH: 32.1 pg (ref 26.0–34.0)
MCHC: 32.7 g/dL (ref 30.0–36.0)
MCV: 98.3 fL (ref 80.0–100.0)
Monocytes Absolute: 0.8 10*3/uL (ref 0.1–1.0)
Monocytes Relative: 12 %
Neutro Abs: 3.3 10*3/uL (ref 1.7–7.7)
Neutrophils Relative %: 51 %
Platelets: 205 10*3/uL (ref 150–400)
RBC: 3.49 MIL/uL — ABNORMAL LOW (ref 3.87–5.11)
RDW: 17.8 % — ABNORMAL HIGH (ref 11.5–15.5)
WBC: 6.5 10*3/uL (ref 4.0–10.5)
nRBC: 0 % (ref 0.0–0.2)

## 2022-01-25 LAB — COMPREHENSIVE METABOLIC PANEL
ALT: 14 U/L (ref 0–44)
AST: 25 U/L (ref 15–41)
Albumin: 4.1 g/dL (ref 3.5–5.0)
Alkaline Phosphatase: 82 U/L (ref 38–126)
Anion gap: 11 (ref 5–15)
BUN: 18 mg/dL (ref 8–23)
CO2: 27 mmol/L (ref 22–32)
Calcium: 9.4 mg/dL (ref 8.9–10.3)
Chloride: 100 mmol/L (ref 98–111)
Creatinine, Ser: 0.89 mg/dL (ref 0.44–1.00)
GFR, Estimated: >60 mL/min (ref >60)
Glucose, Bld: 129 mg/dL — ABNORMAL HIGH (ref 70–99)
Potassium: 3.8 mmol/L (ref 3.5–5.1)
Sodium: 138 mmol/L (ref 135–145)
Total Bilirubin: 0.5 mg/dL (ref 0.3–1.2)
Total Protein: 7.7 g/dL (ref 6.5–8.1)

## 2022-01-25 NOTE — Progress Notes (Signed)
[place] OFFICE PROGRESS NOTE  Patient Care Team: [name], MD as PCP - General (Internal Medicine) Bary Castilla, Forest Gleason, MD (General Surgery) Marden Noble, MD (Internal Medicine) [name], MD as Consulting Physician (Internal Medicine)   SUMMARY OF ONCOLOGIC HISTORY:  Oncology History Overview Note  # 2007- LEFT BREAST CA STAGE II [T2N58m; s/p mastec; Dr.Byrnett] ER-Pos/PR Neg; Her- 2 Neu- NEG; ACx4-Taxol x12; Femara; stopped May 2009-stopped follow up.  #JAN 2015- Post mastectomy Chest wall recurrence [chest wall mass bx-]; ER- 90%; PR- NEG; Her2 Neu- NEG; s/p excision [involving skeletal muscle/adipose tissue; clear margins]; CT July 2017- NED;   # Jan 2020- progression/ hilar- START abema [jan 14th]+ faslodex[jan 13th]; OCT-NOV 2022-PET scan shows progressive disease in the bones to liver.  Discontinue Abema+ Faslodex  # DEC 1st, 2022- START Xeloda 2w/1w  # DEC 2021- anemia/? CKD [stool ];#Ascending colon mass-incidentally noted on PET scan.  S/p  Dr. AVicente Males  colonoscpy DEC 2022- NEGATIVE; reviewed small adenomas.   # OSTEOPENIA [BMD- June 2016]  # Kidney cysts  DIAGNOSIS: LEFT BREAST CA  STAGE: IV- NED  ;GOALS: pallaitive  CURRENT/MOST RECENT THERAPY:     Breast cancer metastasized to skin ([facility]  02/05/2014 Initial Diagnosis   Breast cancer metastasized [facility] skin ([facility]   Malignant neoplasm of overlapping sites of left breast in female, estrogen receptor positive ([place]  07/24/2016 Initial Diagnosis   Cancer of overlapping sites of left female breast ([place]   01/06/2019 -  Chemotherapy   The patient had fulvestrant (FASLODEX) injection 500 mg, 500 mg, Intramuscular,  Once, 1 of 4 cycles Dose modification: 500 mg (original dose 500 mg, Cycle 1) Administration: 500 mg (08/05/2020)   for chemotherapy treatment.      INTERVAL HISTORY: Accompanied by sister, robin.; Ambulating independently.  A very pleasant 81year old  African-American female patient with above history of ER/PR positive breast cancer stage IV currently on Xeloda [dec 1st 2022] is here for follow-up.  Patient denies any nausea vomiting.  Denies any chest pain or shortness of breath or cough.  Notes to have worsening pigmentation of her hands/palms.  Chronic joint pains not any worse.  No fever no chills no sores in the mouth no diarrhea.   Review of Systems  Constitutional:  Positive for malaise/fatigue. Negative for chills, diaphoresis, fever and weight loss.  HENT:  Negative for nosebleeds and sore throat.   Eyes:  Negative for double vision.  Respiratory:  Negative for cough, hemoptysis, sputum production, shortness of breath and wheezing.   Cardiovascular:  Negative for chest pain, palpitations, orthopnea and leg swelling.  Gastrointestinal:  Negative for abdominal pain, blood in stool, constipation, diarrhea, heartburn, melena, nausea and vomiting.  Musculoskeletal:  Positive for back pain and joint pain.  Skin: Negative.  Negative for itching and rash.  Neurological:  Negative for tingling, focal weakness, weakness and headaches.  Endo/Heme/Allergies:  Does not bruise/bleed easily.  Psychiatric/Behavioral:  Negative for depression. The patient is not nervous/anxious and does not have insomnia.   PAST MEDICAL HISTORY :  Past Medical History:  Diagnosis Date   [place] ([facility]   Arthritis    [place]   diagnosed 01/07 left breast with simple mastectomu & sn bx. The pt had a 3.5cm tumor. Frozen section report on the 2 sn were negative. On permanent sections a 0.52mmicrometastasis was identified. She was not felt to require a complete axillary dissection. She has completed her Tamoxifen therapy and has been released from  routine f/u with medical oncology service.   COPD (chronic obstructive pulmonary disease) (HCC)    Diabetes mellitus without complication ([place])    Hyperlipidemia    Hypertension    since age 89    Malignant neoplasm of upper-outer quadrant of female breast ([place]) 12/2013   Mastectomy site recurrence resected 01/26/2014, ER 90%, PR 0%, HER-2/neu nonamplified.   Other benign neoplasm of connective and other soft tissue of trunk, unspecified 2014   Personal history of colonic polyps    Personal history of tobacco use, presenting hazards to health    Special screening for malignant neoplasms, colon    Unspecified disorder of skin and subcutaneous tissue 02/19/2013   biopsy of skin over the sternum on the right breast showed hypertrophic scar,keloid formation.    PAST SURGICAL HISTORY :   Past Surgical History:  Procedure Laterality Date   ABDOMINAL HYSTERECTOMY  2004   total   BREAST BIOPSY Right January 26, 2014   Core biopsy for mild increase breast uptake on PET scan, usual ductal hyperplasia and stromal fibrosis   BREAST SURGERY Left 2007   mastectomy   chest wall mass  01/26/14   CHOLECYSTECTOMY  2007   COLONOSCOPY W/ POLYPECTOMY  2011   Dr. Brynda Greathouse   COLONOSCOPY WITH PROPOFOL N/A 12/22/2021   Procedure: COLONOSCOPY WITH PROPOFOL;  Surgeon: Jonathon Bellows, MD;  Location: [facility] ENDOSCOPY;  Service: Gastroenterology;  Laterality: N/A;   EYE SURGERY Right    cataract surgery   head surgery  1991   MASTECTOMY Left 2007   PORT-A-CATH REMOVAL  2008   PORTACATH PLACEMENT  2007    FAMILY HISTORY :   Family History  Problem Relation Age of Onset   Cancer Other        ovarian,breast,colon cancers;relations not listed   Breast cancer Neg Hx     SOCIAL HISTORY:   Social History   Tobacco Use   Smoking status: Some Days    Packs/day: 1.00    Years: 50.00    Pack years: 50.00    Types: Cigarettes   Smokeless tobacco: Never  Vaping Use   Vaping Use: Some days  Substance Use Topics   Alcohol use: No    Alcohol/week: 0.0 standard drinks   Drug use: No    ALLERGIES:  is allergic to metoprolol.  MEDICATIONS:  Current Outpatient Medications   Medication Sig Dispense Refill   amLODipine (NORVASC) 10 MG tablet TAKE 1 TABLET(10 MG) BY MOUTH DAILY 90 tablet 1   atorvastatin (LIPITOR) 20 MG tablet Take 20 mg by mouth daily at 6 PM.     Calcium Carbonate-Vitamin D 600-200 MG-UNIT CAPS Take 200 capsules by mouth once.     capecitabine (XELODA) 500 MG tablet Take 3 tablets (1,500 mg total) by mouth 2 (two) times daily after a meal. Take for 14 days, then hold for 7 days. Repeat every 21 days. Start on 12/15/21. 84 tablet 1   citalopram (CELEXA) 40 MG tablet Take 1 tablet (40 mg total) by mouth daily. 90 tablet 1   lisinopril (ZESTRIL) 2.5 MG tablet Take 1 tablet (2.5 mg total) by mouth daily. 90 tablet 1   meloxicam (MOBIC) 15 MG tablet Take 1 tablet (15 mg total) by mouth daily. 60 tablet 1   No current facility-administered medications for this visit.    PHYSICAL EXAMINATION: ECOG PERFORMANCE STATUS: 0 - Asymptomatic  BP (!) 158/73 (BP Location: Right Arm, Patient Position: Sitting, Cuff Size: Normal)    Pulse 79  Temp 97.9 F (36.6 C) (Tympanic)    Ht 5' 5"  (1.651 m)    Wt 154 lb 6.4 oz (70 kg)    SpO2 100%    BMI 25.69 kg/m   Filed Weights   01/25/22 1500  Weight: 154 lb 6.4 oz (70 kg)     Physical Exam HENT:     Head: Normocephalic and atraumatic.     Mouth/Throat:     Pharynx: No oropharyngeal exudate.  Eyes:     Pupils: Pupils are equal, round, and reactive to light.  Cardiovascular:     Rate and Rhythm: Normal rate and regular rhythm.  Pulmonary:     Effort: No respiratory distress.     Breath sounds: No wheezing.  Abdominal:     General: Bowel sounds are normal. There is no distension.     Palpations: Abdomen is soft. There is no mass.     Tenderness: There is no abdominal tenderness. There is no guarding or rebound.  Musculoskeletal:        General: No tenderness. Normal range of motion.     Cervical back: Normal range of motion and neck supple.  Skin:    General: Skin is warm.  Neurological:      Mental Status: She is alert and oriented to person, place, and time.  Psychiatric:        Mood and Affect: Affect normal.   LABORATORY DATA:  I have reviewed the data as listed    Component Value Date/Time   NA 138 01/25/2022 1446   NA 139 05/30/2014 0127   K 3.8 01/25/2022 1446   K 3.7 05/30/2014 0127   CL 100 01/25/2022 1446   CL 106 05/30/2014 0127   CO2 27 01/25/2022 1446   CO2 25 05/30/2014 0127   GLUCOSE 129 (H) 01/25/2022 1446   GLUCOSE 136 (H) 05/30/2014 0127   BUN 18 01/25/2022 1446   BUN 19 (H) 05/30/2014 0127   CREATININE 0.89 01/25/2022 1446   CREATININE 0.98 05/30/2014 0127   CALCIUM 9.4 01/25/2022 1446   CALCIUM 9.0 05/30/2014 0127   PROT 7.7 01/25/2022 1446   PROT 7.8 05/08/2014 1131   ALBUMIN 4.1 01/25/2022 1446   ALBUMIN 3.7 05/08/2014 1131   AST 25 01/25/2022 1446   AST 20 05/08/2014 1131   ALT 14 01/25/2022 1446   ALT 18 05/08/2014 1131   ALKPHOS 82 01/25/2022 1446   ALKPHOS 58 05/08/2014 1131   BILITOT 0.5 01/25/2022 1446   BILITOT 0.4 05/08/2014 1131   GFRNONAA >60 01/25/2022 1446   GFRNONAA 58 (L) 05/30/2014 0127   GFRAA 47 (L) 09/02/2020 0920   GFRAA >60 05/30/2014 0127    No results found for: SPEP, UPEP  Lab Results  Component Value Date   WBC 6.5 01/25/2022   NEUTROABS 3.3 01/25/2022   HGB 11.2 (L) 01/25/2022   HCT 34.3 (L) 01/25/2022   MCV 98.3 01/25/2022   PLT 205 01/25/2022      Chemistry      Component Value Date/Time   NA 138 01/25/2022 1446   NA 139 05/30/2014 0127   K 3.8 01/25/2022 1446   K 3.7 05/30/2014 0127   CL 100 01/25/2022 1446   CL 106 05/30/2014 0127   CO2 27 01/25/2022 1446   CO2 25 05/30/2014 0127   BUN 18 01/25/2022 1446   BUN 19 (H) 05/30/2014 0127   CREATININE 0.89 01/25/2022 1446   CREATININE 0.98 05/30/2014 0127      Component Value Date/Time  CALCIUM 9.4 01/25/2022 1446   CALCIUM 9.0 05/30/2014 0127   ALKPHOS 82 01/25/2022 1446   ALKPHOS 58 05/08/2014 1131   AST 25 01/25/2022 1446   AST 20  05/08/2014 1131   ALT 14 01/25/2022 1446   ALT 18 05/08/2014 1131   BILITOT 0.5 01/25/2022 1446   BILITOT 0.4 05/08/2014 1131         RADIOGRAPHIC STUDIES: I have personally reviewed the radiological images as listed and agreed with the findings in the report. No results found.   ASSESSMENT & PLAN:   Malignant neoplasm of overlapping sites of left breast in female, estrogen receptor positive ([place]) #Left breast ipsilateral chest wall recurrence stage IV [2015]; NOV 7th PET scan- New hypermetabolic lesions within the spine and RIGHT iliac bone consistent with progression metastatic skeletal disease;  Hypermetabolic lesion the anterior margin of the LEFT hepatic lobe consistent with hepatic metastasis.   # Currently on xeloda [1567m BID; [2w;1w]-Started dec 1st. patient tolerating Xeloda well without any major side effects.  Tumor marker-January 2023-lower.  We will plan to get imaging in March 2023.  # Bilateral hip pain- arthritis-[s/p ortho evaluation]; on meloxicam.stable  # HTN- 150s stable; continue amlodipine/Lisinopril  # Mild anemia hemoglobin- 11-12/ CKD-STABLE;     # CKD- stage III- GFR 48-50-STABLE;    # Anxiety/depression- celexa- STABLE;    # DISPOSITION:  # follow up in 3 weeks--; MD; cbc/cmp;CEA/ca27-29; Dr.B          GCammie Sickle MD 01/25/2022 4:39 PM

## 2022-01-25 NOTE — Assessment & Plan Note (Addendum)
#  Left breast ipsilateral chest wall recurrence stage IV [2015]; NOV 7th PET scan- New hypermetabolic lesions within the spine and RIGHT iliac bone consistent with progression metastatic skeletal disease;  Hypermetabolic lesion the anterior margin of the LEFT hepatic lobe consistent with hepatic metastasis.   # Currently on xeloda [1500mg  BID; [2w;1w]-Started dec 1st. patient tolerating Xeloda well without any major side effects.  Tumor marker-January 2023-lower.  We will plan to get imaging in March 2023.  # Bilateral hip pain- arthritis-[s/p ortho evaluation]; on meloxicam.stable  # HTN- 150s stable; continue amlodipine/Lisinopril  # Mild anemia hemoglobin- 11-12/ CKD-STABLE;     # CKD- stage III- GFR 48-50-STABLE;    # Anxiety/depression- celexa- STABLE;    # DISPOSITION:  # follow up in 3 weeks--; MD; cbc/cmp;CEA/ca27-29; Dr.B

## 2022-01-26 LAB — CANCER ANTIGEN 27.29: CA 27.29: 98.2 U/mL — ABNORMAL HIGH (ref 0.0–38.6)

## 2022-02-07 ENCOUNTER — Other Ambulatory Visit (HOSPITAL_COMMUNITY): Payer: Self-pay

## 2022-02-09 ENCOUNTER — Other Ambulatory Visit (HOSPITAL_COMMUNITY): Payer: Self-pay

## 2022-02-15 ENCOUNTER — Encounter: Payer: Self-pay | Admitting: Internal Medicine

## 2022-02-15 ENCOUNTER — Other Ambulatory Visit: Payer: Self-pay

## 2022-02-15 ENCOUNTER — Inpatient Hospital Stay (HOSPITAL_BASED_OUTPATIENT_CLINIC_OR_DEPARTMENT_OTHER): Payer: Medicare HMO | Admitting: Internal Medicine

## 2022-02-15 ENCOUNTER — Inpatient Hospital Stay: Payer: Medicare HMO

## 2022-02-15 VITALS — BP 181/81 | HR 82 | Temp 98.3°F | Ht 65.0 in | Wt 152.8 lb

## 2022-02-15 DIAGNOSIS — C7989 Secondary malignant neoplasm of other specified sites: Secondary | ICD-10-CM | POA: Diagnosis not present

## 2022-02-15 DIAGNOSIS — C7951 Secondary malignant neoplasm of bone: Secondary | ICD-10-CM | POA: Diagnosis not present

## 2022-02-15 DIAGNOSIS — C787 Secondary malignant neoplasm of liver and intrahepatic bile duct: Secondary | ICD-10-CM | POA: Diagnosis not present

## 2022-02-15 DIAGNOSIS — Z170 Estrogen receptor positive status [ER+]: Secondary | ICD-10-CM

## 2022-02-15 DIAGNOSIS — M8580 Other specified disorders of bone density and structure, unspecified site: Secondary | ICD-10-CM | POA: Diagnosis not present

## 2022-02-15 DIAGNOSIS — C792 Secondary malignant neoplasm of skin: Secondary | ICD-10-CM | POA: Diagnosis not present

## 2022-02-15 DIAGNOSIS — F17210 Nicotine dependence, cigarettes, uncomplicated: Secondary | ICD-10-CM | POA: Diagnosis not present

## 2022-02-15 DIAGNOSIS — C50812 Malignant neoplasm of overlapping sites of left female breast: Secondary | ICD-10-CM

## 2022-02-15 DIAGNOSIS — Z79899 Other long term (current) drug therapy: Secondary | ICD-10-CM | POA: Diagnosis not present

## 2022-02-15 DIAGNOSIS — D649 Anemia, unspecified: Secondary | ICD-10-CM | POA: Diagnosis not present

## 2022-02-15 LAB — CBC WITH DIFFERENTIAL/PLATELET
Abs Immature Granulocytes: 0.02 10*3/uL (ref 0.00–0.07)
Basophils Absolute: 0 10*3/uL (ref 0.0–0.1)
Basophils Relative: 1 %
Eosinophils Absolute: 0.2 10*3/uL (ref 0.0–0.5)
Eosinophils Relative: 2 %
HCT: 35.1 % — ABNORMAL LOW (ref 36.0–46.0)
Hemoglobin: 11.3 g/dL — ABNORMAL LOW (ref 12.0–15.0)
Immature Granulocytes: 0 %
Lymphocytes Relative: 32 %
Lymphs Abs: 2.4 10*3/uL (ref 0.7–4.0)
MCH: 32.1 pg (ref 26.0–34.0)
MCHC: 32.2 g/dL (ref 30.0–36.0)
MCV: 99.7 fL (ref 80.0–100.0)
Monocytes Absolute: 0.9 10*3/uL (ref 0.1–1.0)
Monocytes Relative: 12 %
Neutro Abs: 4 10*3/uL (ref 1.7–7.7)
Neutrophils Relative %: 53 %
Platelets: 201 10*3/uL (ref 150–400)
RBC: 3.52 MIL/uL — ABNORMAL LOW (ref 3.87–5.11)
RDW: 17.7 % — ABNORMAL HIGH (ref 11.5–15.5)
WBC: 7.5 10*3/uL (ref 4.0–10.5)
nRBC: 0 % (ref 0.0–0.2)

## 2022-02-15 LAB — COMPREHENSIVE METABOLIC PANEL
ALT: 14 U/L (ref 0–44)
AST: 27 U/L (ref 15–41)
Albumin: 4 g/dL (ref 3.5–5.0)
Alkaline Phosphatase: 81 U/L (ref 38–126)
Anion gap: 9 (ref 5–15)
BUN: 23 mg/dL (ref 8–23)
CO2: 27 mmol/L (ref 22–32)
Calcium: 9.4 mg/dL (ref 8.9–10.3)
Chloride: 101 mmol/L (ref 98–111)
Creatinine, Ser: 0.99 mg/dL (ref 0.44–1.00)
GFR, Estimated: 58 mL/min — ABNORMAL LOW (ref >60)
Glucose, Bld: 110 mg/dL — ABNORMAL HIGH (ref 70–99)
Potassium: 3.7 mmol/L (ref 3.5–5.1)
Sodium: 137 mmol/L (ref 135–145)
Total Bilirubin: 0.4 mg/dL (ref 0.3–1.2)
Total Protein: 7.8 g/dL (ref 6.5–8.1)

## 2022-02-15 NOTE — Progress Notes (Signed)
[place] OFFICE PROGRESS NOTE  Patient Care Team: [name], MD as PCP - General (Internal Medicine) Bary Castilla, Forest Gleason, MD (General Surgery) Marden Noble, MD (Internal Medicine) [name], MD as Consulting Physician (Internal Medicine)   SUMMARY OF ONCOLOGIC HISTORY:  Oncology History Overview Note  # 2007- LEFT BREAST CA STAGE II [T2N65m; s/p mastec; Dr.Byrnett] ER-Pos/PR Neg; Her- 2 Neu- NEG; ACx4-Taxol x12; Femara; stopped May 2009-stopped follow up.  #JAN 2015- Post mastectomy Chest wall recurrence [chest wall mass bx-]; ER- 90%; PR- NEG; Her2 Neu- NEG; s/p excision [involving skeletal muscle/adipose tissue; clear margins]; CT July 2017- NED;   # Jan 2020- progression/ hilar- START abema [jan 14th]+ faslodex[jan 13th]; OCT-NOV 2022-PET scan shows progressive disease in the bones to liver.  Discontinue Abema+ Faslodex  # DEC 1st, 2022- START Xeloda 2w/1w  # DEC 2021- anemia/? CKD [stool ];#Ascending colon mass-incidentally noted on PET scan.  S/p  Dr. AVicente Males  colonoscpy DEC 2022- NEGATIVE; reviewed small adenomas.   # OSTEOPENIA [BMD- June 2016]  # Kidney cysts  DIAGNOSIS: LEFT BREAST CA  STAGE: IV- NED  ;GOALS: pallaitive  CURRENT/MOST RECENT THERAPY:     Breast cancer metastasized to skin ([place]  02/05/2014 Initial Diagnosis   [facility]   Malignant neoplasm of overlapping sites of left breast in female, estrogen receptor positive ([place]  07/24/2016 Initial Diagnosis   Cancer of overlapping sites of left female breast ([place]   01/06/2019 -  Chemotherapy   The patient had fulvestrant (FASLODEX) injection 500 mg, 500 mg, Intramuscular,  Once, 1 of 4 cycles Dose modification: 500 mg (original dose 500 mg, Cycle 1) Administration: 500 mg (08/05/2020)   for chemotherapy treatment.      INTERVAL HISTORY: Accompanied by sister, robin.; Ambulating independently.  A very pleasant 81year old  African-American female patient with above history of ER/PR positive breast cancer stage IV currently on Xeloda [dec 1st 2022] is here for follow-up.  Patient denies any nausea vomiting abdominal pain.  No fever no chills.  No rash on palms and soles.  Chronic joint pains.  Not worse.  No sores in the mouth.  No diarrhea.  Review of Systems  Constitutional:  Positive for malaise/fatigue. Negative for chills, diaphoresis, fever and weight loss.  HENT:  Negative for nosebleeds and sore throat.   Eyes:  Negative for double vision.  Respiratory:  Negative for cough, hemoptysis, sputum production, shortness of breath and wheezing.   Cardiovascular:  Negative for chest pain, palpitations, orthopnea and leg swelling.  Gastrointestinal:  Negative for abdominal pain, blood in stool, constipation, diarrhea, heartburn, melena, nausea and vomiting.  Musculoskeletal:  Positive for back pain and joint pain.  Skin: Negative.  Negative for itching and rash.  Neurological:  Negative for tingling, focal weakness, weakness and headaches.  Endo/Heme/Allergies:  Does not bruise/bleed easily.  Psychiatric/Behavioral:  Negative for depression. The patient is not nervous/anxious and does not have insomnia.   PAST MEDICAL HISTORY :  Past Medical History:  Diagnosis Date   [place] ([place]   Arthritis    [place]   diagnosed 01/07 left breast with simple mastectomu & sn bx. The pt had a 3.5cm tumor. Frozen section report on the 2 sn were negative. On permanent sections a 0.51mmicrometastasis was identified. She was not felt to require a complete axillary dissection. She has completed her Tamoxifen therapy and has been released from routine f/u with medical oncology service.   COPD (chronic  obstructive pulmonary disease) ([place])    Diabetes mellitus without complication ([place])    Hyperlipidemia    Hypertension    since age 76   Malignant neoplasm of upper-outer quadrant of female breast ([place]) 12/2013    Mastectomy site recurrence resected 01/26/2014, ER 90%, PR 0%, HER-2/neu nonamplified.   Other benign neoplasm of connective and other soft tissue of trunk, unspecified 2014   Personal history of colonic polyps    Personal history of tobacco use, presenting hazards to health    Special screening for malignant neoplasms, colon    Unspecified disorder of skin and subcutaneous tissue 02/19/2013   biopsy of skin over the sternum on the right breast showed hypertrophic scar,keloid formation.    PAST SURGICAL HISTORY :   Past Surgical History:  Procedure Laterality Date   ABDOMINAL HYSTERECTOMY  2004   total   BREAST BIOPSY Right January 26, 2014   Core biopsy for mild increase breast uptake on PET scan, usual ductal hyperplasia and stromal fibrosis   BREAST SURGERY Left 2007   mastectomy   chest wall mass  01/26/14   CHOLECYSTECTOMY  2007   COLONOSCOPY W/ POLYPECTOMY  2011   Dr. Brynda Greathouse   COLONOSCOPY WITH PROPOFOL N/A 12/22/2021   Procedure: COLONOSCOPY WITH PROPOFOL;  Surgeon: Jonathon Bellows, MD;  Location[facility] ENDOSCOPY;  Service: Gastroenterology;  Laterality: N/A;   EYE SURGERY Right    cataract surgery   head surgery  1991   MASTECTOMY Left 2007   PORT-A-CATH REMOVAL  2008   PORTACATH PLACEMENT  2007    FAMILY HISTORY :   Family History  Problem Relation Age of Onset   Cancer Other        ovarian,breast,colon cancers;relations not listed   Breast cancer Neg Hx     SOCIAL HISTORY:   Social History   Tobacco Use   Smoking status: Some Days    Packs/day: 1.00    Years: 50.00    Pack years: 50.00    Types: Cigarettes   Smokeless tobacco: Never  Vaping Use   Vaping Use: Some days  Substance Use Topics   Alcohol use: No    Alcohol/week: 0.0 standard drinks   Drug use: No    ALLERGIES:  is allergic to metoprolol.  MEDICATIONS:  Current Outpatient Medications  Medication Sig Dispense Refill   amLODipine (NORVASC) 10 MG tablet TAKE 1 TABLET(10 MG) BY MOUTH DAILY 90  tablet 1   atorvastatin (LIPITOR) 20 MG tablet Take 20 mg by mouth daily at 6 PM.     Calcium Carbonate-Vitamin D 600-200 MG-UNIT CAPS Take 200 capsules by mouth once.     capecitabine (XELODA) 500 MG tablet Take 3 tablets (1,500 mg total) by mouth 2 (two) times daily after a meal. Take for 14 days, then hold for 7 days. Repeat every 21 days. Start on 12/15/21. 84 tablet 1   citalopram (CELEXA) 40 MG tablet Take 1 tablet (40 mg total) by mouth daily. 90 tablet 1   lisinopril (ZESTRIL) 2.5 MG tablet Take 1 tablet (2.5 mg total) by mouth daily. 90 tablet 1   meloxicam (MOBIC) 15 MG tablet Take 1 tablet (15 mg total) by mouth daily. 60 tablet 1   No current facility-administered medications for this visit.    PHYSICAL EXAMINATION: ECOG PERFORMANCE STATUS: 0 - Asymptomatic  BP (!) 181/81 (BP Location: Right Arm, Patient Position: Sitting, Cuff Size: Normal)    Pulse 82    Temp 98.3 F (36.8 C) (Tympanic)  Ht 5' 5"  (1.651 m)    Wt 152 lb 12.8 oz (69.3 kg)    SpO2 100%    BMI 25.43 kg/m   Filed Weights   02/15/22 1424  Weight: 152 lb 12.8 oz (69.3 kg)     Physical Exam HENT:     Head: Normocephalic and atraumatic.     Mouth/Throat:     Pharynx: No oropharyngeal exudate.  Eyes:     Pupils: Pupils are equal, round, and reactive to light.  Cardiovascular:     Rate and Rhythm: Normal rate and regular rhythm.  Pulmonary:     Effort: No respiratory distress.     Breath sounds: No wheezing.  Abdominal:     General: Bowel sounds are normal. There is no distension.     Palpations: Abdomen is soft. There is no mass.     Tenderness: There is no abdominal tenderness. There is no guarding or rebound.  Musculoskeletal:        General: No tenderness. Normal range of motion.     Cervical back: Normal range of motion and neck supple.  Skin:    General: Skin is warm.  Neurological:     Mental Status: She is alert and oriented to person, place, and time.  Psychiatric:        Mood and Affect:  Affect normal.   LABORATORY DATA:  I have reviewed the data as listed    Component Value Date/Time   NA 137 02/15/2022 1416   NA 139 05/30/2014 0127   K 3.7 02/15/2022 1416   K 3.7 05/30/2014 0127   CL 101 02/15/2022 1416   CL 106 05/30/2014 0127   CO2 27 02/15/2022 1416   CO2 25 05/30/2014 0127   GLUCOSE 110 (H) 02/15/2022 1416   GLUCOSE 136 (H) 05/30/2014 0127   BUN 23 02/15/2022 1416   BUN 19 (H) 05/30/2014 0127   CREATININE 0.99 02/15/2022 1416   CREATININE 0.98 05/30/2014 0127   CALCIUM 9.4 02/15/2022 1416   CALCIUM 9.0 05/30/2014 0127   PROT 7.8 02/15/2022 1416   PROT 7.8 05/08/2014 1131   ALBUMIN 4.0 02/15/2022 1416   ALBUMIN 3.7 05/08/2014 1131   AST 27 02/15/2022 1416   AST 20 05/08/2014 1131   ALT 14 02/15/2022 1416   ALT 18 05/08/2014 1131   ALKPHOS 81 02/15/2022 1416   ALKPHOS 58 05/08/2014 1131   BILITOT 0.4 02/15/2022 1416   BILITOT 0.4 05/08/2014 1131   GFRNONAA 58 (L) 02/15/2022 1416   GFRNONAA 58 (L) 05/30/2014 0127   GFRAA 47 (L) 09/02/2020 0920   GFRAA >60 05/30/2014 0127    No results found for: SPEP, UPEP  Lab Results  Component Value Date   WBC 7.5 02/15/2022   NEUTROABS 4.0 02/15/2022   HGB 11.3 (L) 02/15/2022   HCT 35.1 (L) 02/15/2022   MCV 99.7 02/15/2022   PLT 201 02/15/2022      Chemistry      Component Value Date/Time   NA 137 02/15/2022 1416   NA 139 05/30/2014 0127   K 3.7 02/15/2022 1416   K 3.7 05/30/2014 0127   CL 101 02/15/2022 1416   CL 106 05/30/2014 0127   CO2 27 02/15/2022 1416   CO2 25 05/30/2014 0127   BUN 23 02/15/2022 1416   BUN 19 (H) 05/30/2014 0127   CREATININE 0.99 02/15/2022 1416   CREATININE 0.98 05/30/2014 0127      Component Value Date/Time   CALCIUM 9.4 02/15/2022 1416   CALCIUM 9.0  05/30/2014 0127   ALKPHOS 81 02/15/2022 1416   ALKPHOS 58 05/08/2014 1131   AST 27 02/15/2022 1416   AST 20 05/08/2014 1131   ALT 14 02/15/2022 1416   ALT 18 05/08/2014 1131   BILITOT 0.4 02/15/2022 1416    BILITOT 0.4 05/08/2014 1131         RADIOGRAPHIC STUDIES: I have personally reviewed the radiological images as listed and agreed with the findings in the report. No results found.   ASSESSMENT & PLAN:   Malignant neoplasm of overlapping sites of left breast in female, estrogen receptor positive ([place]) #Left breast ipsilateral chest wall recurrence stage IV [2015]; NOV 7th PET scan- New hypermetabolic lesions within the spine and RIGHT iliac bone consistent with progression metastatic skeletal disease;  Hypermetabolic lesion the anterior margin of the LEFT hepatic lobe consistent with hepatic metastasis.   # Currently on xeloda [1548m BID; [2w;1w]-Started dec 1st. patient tolerating Xeloda well without any major side effects.  Tumor marker-January 2023-lower.  We will plan to get imaging in March 2023; CT scan ordered today.  # Bilateral hip pain- arthritis-[s/p ortho evaluation]; on meloxicam.s STABLE.   # HTN- 180s stable; continue amlodipine/Lisinopril- #Hypertension-poorly [id number].  Discussed that poorly controlled blood pressures can cause a stroke/heart attacks and other cardiovascular problems. Pt admits to compliance with medication. Recommend also checking blood pressures at home frequently. keep a log of blood pressures/and bring it to PCPs attention.    # Mild anemia hemoglobin- 11-12/ CKD-STABLE;     # CKD- stage III- GFR 48-50-STABLE;    # Anxiety/depression- celexa- STABLE;    # DISPOSITION:  # follow up in 3 weeks--; MD; cbc/cmp;CEA/ca27-29;CT CAP prior- Dr.B          GCammie Sickle MD 02/15/2022 3:57 PM

## 2022-02-15 NOTE — Patient Instructions (Signed)
Recommend also checking blood pressures at home daily;  keep a log of blood pressures/and bring it to next visit.

## 2022-02-15 NOTE — Assessment & Plan Note (Addendum)
#  Left breast ipsilateral chest wall recurrence stage IV [2015]; NOV 7th PET scan- New hypermetabolic lesions within the spine and RIGHT iliac bone consistent with progression metastatic skeletal disease;  Hypermetabolic lesion the anterior margin of the LEFT hepatic lobe consistent with hepatic metastasis.   # Currently on xeloda [1500mg  BID; [2w;1w]-Started dec 1st. patient tolerating Xeloda well without any major side effects.  Tumor marker-January 2023-lower.  We will plan to get imaging in March 2023; CT scan ordered today.  # Bilateral hip pain- arthritis-[s/p ortho evaluation]; on meloxicam.s STABLE.   # HTN- 180s stable; continue amlodipine/Lisinopril- #Hypertension-poorly [id number] systolic.  Discussed that poorly controlled blood pressures can cause a stroke/heart attacks and other cardiovascular problems. Pt admits to compliance with medication. Recommend also checking blood pressures at home frequently. keep a log of blood pressures/and bring it to PCPs attention.    # Mild anemia hemoglobin- 11-12/ CKD-STABLE;     # CKD- stage III- GFR 48-50-STABLE;    # Anxiety/depression- celexa- STABLE;    # DISPOSITION:  # follow up in 3 weeks--; MD; cbc/cmp;CEA/ca27-29;CT CAP prior- Dr.B

## 2022-02-16 LAB — CEA: CEA: 5.2 ng/mL — ABNORMAL HIGH (ref 0.0–4.7)

## 2022-02-16 LAB — CANCER ANTIGEN 27.29: CA 27.29: 101.4 U/mL — ABNORMAL HIGH (ref 0.0–38.6)

## 2022-02-26 ENCOUNTER — Other Ambulatory Visit: Payer: Self-pay | Admitting: Internal Medicine

## 2022-02-27 ENCOUNTER — Encounter: Payer: Self-pay | Admitting: Internal Medicine

## 2022-03-03 ENCOUNTER — Ambulatory Visit
Admission: RE | Admit: 2022-03-03 | Discharge: 2022-03-03 | Disposition: A | Discharge status: Home / Self Care | Payer: Medicare HMO | Source: Ambulatory Visit | Attending: Internal Medicine | Admitting: Internal Medicine

## 2022-03-03 ENCOUNTER — Other Ambulatory Visit: Payer: Self-pay

## 2022-03-03 DIAGNOSIS — K7689 Other specified diseases of liver: Secondary | ICD-10-CM | POA: Diagnosis not present

## 2022-03-03 DIAGNOSIS — Z170 Estrogen receptor positive status [ER+]: Secondary | ICD-10-CM | POA: Insufficient documentation

## 2022-03-03 DIAGNOSIS — C7981 Secondary malignant neoplasm of breast: Secondary | ICD-10-CM | POA: Diagnosis not present

## 2022-03-03 DIAGNOSIS — R918 Other nonspecific abnormal finding of lung field: Secondary | ICD-10-CM | POA: Diagnosis not present

## 2022-03-03 DIAGNOSIS — J439 Emphysema, unspecified: Secondary | ICD-10-CM | POA: Diagnosis not present

## 2022-03-03 DIAGNOSIS — C50812 Malignant neoplasm of overlapping sites of left female breast: Secondary | ICD-10-CM | POA: Insufficient documentation

## 2022-03-03 DIAGNOSIS — K5730 Diverticulosis of large intestine without perforation or abscess without bleeding: Secondary | ICD-10-CM | POA: Diagnosis not present

## 2022-03-03 MED ORDER — IOHEXOL 300 MG/ML  SOLN
100.0000 mL | Freq: Once | INTRAMUSCULAR | Status: AC | PRN
  Administered 2022-03-03: 100 mL via INTRAVENOUS

## 2022-03-06 ENCOUNTER — Other Ambulatory Visit (HOSPITAL_COMMUNITY): Payer: Self-pay

## 2022-03-08 ENCOUNTER — Other Ambulatory Visit (HOSPITAL_COMMUNITY): Payer: Self-pay

## 2022-03-08 ENCOUNTER — Inpatient Hospital Stay: Payer: Medicare HMO | Attending: Internal Medicine

## 2022-03-08 ENCOUNTER — Encounter: Payer: Self-pay | Admitting: Internal Medicine

## 2022-03-08 ENCOUNTER — Telehealth: Payer: Self-pay

## 2022-03-08 ENCOUNTER — Inpatient Hospital Stay (HOSPITAL_BASED_OUTPATIENT_CLINIC_OR_DEPARTMENT_OTHER): Payer: Medicare HMO | Admitting: Internal Medicine

## 2022-03-08 ENCOUNTER — Other Ambulatory Visit: Payer: Self-pay

## 2022-03-08 DIAGNOSIS — C50812 Malignant neoplasm of overlapping sites of left female breast: Secondary | ICD-10-CM

## 2022-03-08 DIAGNOSIS — Z79899 Other long term (current) drug therapy: Secondary | ICD-10-CM | POA: Insufficient documentation

## 2022-03-08 DIAGNOSIS — Z170 Estrogen receptor positive status [ER+]: Secondary | ICD-10-CM | POA: Diagnosis not present

## 2022-03-08 DIAGNOSIS — C787 Secondary malignant neoplasm of liver and intrahepatic bile duct: Secondary | ICD-10-CM | POA: Diagnosis not present

## 2022-03-08 DIAGNOSIS — Z5111 Encounter for antineoplastic chemotherapy: Secondary | ICD-10-CM | POA: Insufficient documentation

## 2022-03-08 DIAGNOSIS — C7951 Secondary malignant neoplasm of bone: Secondary | ICD-10-CM | POA: Diagnosis not present

## 2022-03-08 LAB — CBC WITH DIFFERENTIAL/PLATELET
Abs Immature Granulocytes: 0.02 10*3/uL (ref 0.00–0.07)
Basophils Absolute: 0 10*3/uL (ref 0.0–0.1)
Basophils Relative: 0 %
Eosinophils Absolute: 0.2 10*3/uL (ref 0.0–0.5)
Eosinophils Relative: 2 %
HCT: 35 % — ABNORMAL LOW (ref 36.0–46.0)
Hemoglobin: 11.4 g/dL — ABNORMAL LOW (ref 12.0–15.0)
Immature Granulocytes: 0 %
Lymphocytes Relative: 35 %
Lymphs Abs: 2.4 10*3/uL (ref 0.7–4.0)
MCH: 32.4 pg (ref 26.0–34.0)
MCHC: 32.6 g/dL (ref 30.0–36.0)
MCV: 99.4 fL (ref 80.0–100.0)
Monocytes Absolute: 0.6 10*3/uL (ref 0.1–1.0)
Monocytes Relative: 9 %
Neutro Abs: 3.6 10*3/uL (ref 1.7–7.7)
Neutrophils Relative %: 54 %
Platelets: 233 10*3/uL (ref 150–400)
RBC: 3.52 MIL/uL — ABNORMAL LOW (ref 3.87–5.11)
RDW: 17.4 % — ABNORMAL HIGH (ref 11.5–15.5)
WBC: 6.8 10*3/uL (ref 4.0–10.5)
nRBC: 0 % (ref 0.0–0.2)

## 2022-03-08 LAB — COMPREHENSIVE METABOLIC PANEL
ALT: 14 U/L (ref 0–44)
AST: 29 U/L (ref 15–41)
Albumin: 4.2 g/dL (ref 3.5–5.0)
Alkaline Phosphatase: 75 U/L (ref 38–126)
Anion gap: 9 (ref 5–15)
BUN: 25 mg/dL — ABNORMAL HIGH (ref 8–23)
CO2: 26 mmol/L (ref 22–32)
Calcium: 9.2 mg/dL (ref 8.9–10.3)
Chloride: 99 mmol/L (ref 98–111)
Creatinine, Ser: 1.13 mg/dL — ABNORMAL HIGH (ref 0.44–1.00)
GFR, Estimated: 49 mL/min — ABNORMAL LOW (ref >60)
Glucose, Bld: 116 mg/dL — ABNORMAL HIGH (ref 70–99)
Potassium: 4 mmol/L (ref 3.5–5.1)
Sodium: 134 mmol/L — ABNORMAL LOW (ref 135–145)
Total Bilirubin: 0.8 mg/dL (ref 0.3–1.2)
Total Protein: 7.8 g/dL (ref 6.5–8.1)

## 2022-03-08 MED ORDER — PROCHLORPERAZINE MALEATE 10 MG PO TABS: 10.0000 mg | ORAL_TABLET | Freq: Four times a day (QID) | ORAL | 1 refills | Status: DC | PRN

## 2022-03-08 MED ORDER — ONDANSETRON HCL 8 MG PO TABS: ORAL_TABLET | ORAL | 1 refills | Status: AC

## 2022-03-08 MED ORDER — LIDOCAINE-PRILOCAINE 2.5-2.5 % EX CREA: TOPICAL_CREAM | CUTANEOUS | 3 refills | Status: DC

## 2022-03-08 NOTE — Progress Notes (Signed)
[facility] ?OFFICE PROGRESS NOTE ? ?Patient Care Team: ?[name], MD as PCP - General (Internal Medicine) ?[name], MD (General Surgery) ?Marden Noble, MD (Internal Medicine) ?[name], MD as Consulting Physician (Internal Medicine) ? ? ?SUMMARY OF ONCOLOGIC HISTORY: ? ?Oncology History Overview Note  ?# 2007- LEFT BREAST CA STAGE II [T2N29m; s/p mastec; Dr.Byrnett] ER-Pos/PR Neg; Her- 2 Neu- NEG; ACx4-Taxol x12; Femara; stopped May 2009-stopped follow up. ? ?#JAN 2015- Post mastectomy Chest wall recurrence [chest wall mass bx-]; ER- 90%; PR- NEG; Her2 Neu- NEG; s/p excision [involving skeletal muscle/adipose tissue; clear margins]; CT July 2017- NED;  ? ?# Jan 2020- progression/ hilar- START abema [jan 14th]+ faslodex[jan 13th]; OCT-NOV 2022-PET scan shows progressive disease in the bones to liver.  Discontinue Abema+ Faslodex ? ?# DEC 1st, 2022- START Xeloda 2w/1w; MARCH 10th, 2023-progressive disease-lung bone liver ? ?# end of March 2023- taxol weekly ? ?# DEC 2021- anemia/? CKD [stool ];#Ascending colon mass-incidentally noted on PET scan.  S/p  Dr. AVicente Males  colonoscpy DEC 2022- NEGATIVE; reviewed small adenomas.  ? ?# OSTEOPENIA [BMD- June 2016] ? ?# Kidney cysts ? ?DIAGNOSIS: LEFT BREAST CA ? ?STAGE: IV- NED  ;GOALS: pallaitive ? ? ? ?  ?Breast cancer metastasized to skin ([facility]  ?02/05/2014 Initial Diagnosis  ? Breast cancer metastasized to skin ([facility] ?  ?Malignant neoplasm of overlapping sites of left breast in female, estrogen receptor positive ([facility]  ?07/24/2016 Initial Diagnosis  ? Cancer of overlapping sites of left female breast ([facility] ?  ?01/06/2019 - 08/05/2020 Chemotherapy  ? Patient is on Treatment Plan : BREAST Abemaciclib + Fulvestrant q28d  ?   ?03/08/2022 -  Chemotherapy  ? Patient is on Treatment Plan : BREAST Paclitaxel D1,8,15 q28d  ?   ? ?INTERVAL HISTORY: Accompanied by sister, robin.; Ambulating independently. ? ?A very pleasant 81year old  African-American female patient with above history of ER/PR positive breast cancer stage IV currently on Xeloda [dec 1st 2022] is here for follow-up/review of the CT scan. ? ?Patient denies any nausea vomiting abdominal pain.  No fever no chills.  No rash on palms and soles.  Chronic joint pains.  Not worse.  No sores in the mouth.  No diarrhea. ? ?Review of Systems  ?Constitutional:  Positive for malaise/fatigue. Negative for chills, diaphoresis, fever and weight loss.  ?HENT:  Negative for nosebleeds and sore throat.   ?Eyes:  Negative for double vision.  ?Respiratory:  Negative for cough, hemoptysis, sputum production, shortness of breath and wheezing.   ?Cardiovascular:  Negative for chest pain, palpitations, orthopnea and leg swelling.  ?Gastrointestinal:  Negative for abdominal pain, blood in stool, constipation, diarrhea, heartburn, melena, nausea and vomiting.  ?Musculoskeletal:  Positive for back pain and joint pain.  ?Skin: Negative.  Negative for itching and rash.  ?Neurological:  Negative for tingling, focal weakness, weakness and headaches.  ?Endo/Heme/Allergies:  Does not bruise/bleed easily.  ?Psychiatric/Behavioral:  Negative for depression. The patient is not nervous/anxious and does not have insomnia.   ?PAST MEDICAL HISTORY :  ?Past Medical History:  ?Diagnosis Date  ? [place]  ? Arthritis   ? Cancer ([facility] 2007  ? diagnosed 01/07 left breast with simple mastectomu & sn bx. The pt had a 3.5cm tumor. Frozen section report on the 2 sn were negative. On permanent sections a 0.578mmicrometastasis was identified. She was not felt to require a complete axillary dissection. She has completed her Tamoxifen therapy and has been released from  routine f/u with medical oncology service.  ? COPD (chronic obstructive pulmonary disease) ([place])   ? Diabetes mellitus without complication ([place])   ? Hyperlipidemia   ? Hypertension   ? since age 49  ? Malignant neoplasm of upper-outer quadrant of female  breast ([place]) 12/2013  ? Mastectomy site recurrence resected 01/26/2014, ER 90%, PR 0%, HER-2/neu nonamplified.  ? Other benign neoplasm of connective and other soft tissue of trunk, unspecified 2014  ? Personal history of colonic polyps   ? Personal history of tobacco use, presenting hazards to health   ? Special screening for malignant neoplasms, colon   ? Unspecified disorder of skin and subcutaneous tissue 02/19/2013  ? biopsy of skin over the sternum on the right breast showed hypertrophic scar,keloid formation.  ? ? ?PAST SURGICAL HISTORY :   ?Past Surgical History:  ?Procedure Laterality Date  ? ABDOMINAL HYSTERECTOMY  2004  ? total  ? BREAST BIOPSY Right January 26, 2014  ? Core biopsy for mild increase breast uptake on PET scan, usual ductal hyperplasia and stromal fibrosis  ? BREAST SURGERY Left 2007  ? mastectomy  ? chest wall mass  01/26/14  ? CHOLECYSTECTOMY  2007  ? COLONOSCOPY W/ POLYPECTOMY  2011  ? Dr. Brynda Greathouse  ? COLONOSCOPY WITH PROPOFOL N/A 12/22/2021  ? Procedure: COLONOSCOPY WITH PROPOFOL;  Surgeon: Jonathon Bellows, MD;  Location: [facility] ENDOSCOPY;  Service: Gastroenterology;  Laterality: N/A;  ? EYE SURGERY Right   ? cataract surgery  ? head surgery  1991  ? MASTECTOMY Left 2007  ? PORT-A-CATH REMOVAL  2008  ? PORTACATH PLACEMENT  2007  ? ? ?FAMILY HISTORY :   ?Family History  ?Problem Relation Age of Onset  ? Cancer Other   ?     ovarian,breast,colon cancers;relations not listed  ? Breast cancer Neg Hx   ? ? ?SOCIAL HISTORY:   ?Social History  ? ?Tobacco Use  ? Smoking status: Some Days  ?  Packs/day: 1.00  ?  Years: 50.00  ?  Pack years: 50.00  ?  Types: Cigarettes  ? Smokeless tobacco: Never  ?Vaping Use  ? Vaping Use: Some days  ?Substance Use Topics  ? Alcohol use: No  ?  Alcohol/week: 0.0 standard drinks  ? Drug use: No  ? ? ?ALLERGIES:  is allergic to metoprolol. ? ?MEDICATIONS:  ?Current Outpatient Medications  ?Medication Sig Dispense Refill  ? amLODipine (NORVASC) 10 MG tablet TAKE 1 TABLET(10  MG) BY MOUTH DAILY 90 tablet 1  ? atorvastatin (LIPITOR) 20 MG tablet Take 20 mg by mouth daily at 6 PM.    ? Calcium Carbonate-Vitamin D 600-200 MG-UNIT CAPS Take 200 capsules by mouth once.    ? capecitabine (XELODA) 500 MG tablet Take 3 tablets (1,500 mg total) by mouth 2 (two) times daily after a meal. Take for 14 days, then hold for 7 days. Repeat every 21 days. Start on 12/15/21. 84 tablet 1  ? citalopram (CELEXA) 40 MG tablet TAKE 1 TABLET(40 MG) BY MOUTH DAILY 90 tablet 1  ? lidocaine-prilocaine (EMLA) cream Apply on the port. 30 -45 min  prior to port access. 30 g 3  ? lisinopril (ZESTRIL) 2.5 MG tablet Take 1 tablet (2.5 mg total) by mouth daily. 90 tablet 1  ? meloxicam (MOBIC) 15 MG tablet Take 1 tablet (15 mg total) by mouth daily. 60 tablet 1  ? ondansetron (ZOFRAN) 8 MG tablet One pill every 8 hours as needed for nausea/vomitting. 40 tablet 1  ? prochlorperazine (COMPAZINE)  10 MG tablet Take 1 tablet (10 mg total) by mouth every 6 (six) hours as needed for nausea or vomiting. 40 tablet 1  ? ?No current facility-administered medications for this visit.  ? ? ?PHYSICAL EXAMINATION: ?ECOG PERFORMANCE STATUS: 0 - Asymptomatic ? ?BP (!) 158/73 (BP Location: Right Arm, Patient Position: Sitting, Cuff Size: Normal)   Pulse 72   Temp (!) 97.1 ?F (36.2 ?C) (Tympanic)   Ht 5' 5" (1.651 m)   Wt 151 lb 12.8 oz (68.9 kg)   SpO2 99%   BMI 25.26 kg/m?  ? Danley Danker Weights  ? 03/08/22 1433  ?Weight: 151 lb 12.8 oz (68.9 kg)  ? ? ? ?Physical Exam ?HENT:  ?   Head: Normocephalic and atraumatic.  ?   Mouth/Throat:  ?   Pharynx: No oropharyngeal exudate.  ?Eyes:  ?   Pupils: Pupils are equal, round, and reactive to light.  ?Cardiovascular:  ?   Rate and Rhythm: Normal rate and regular rhythm.  ?Pulmonary:  ?   Effort: No respiratory distress.  ?   Breath sounds: No wheezing.  ?Abdominal:  ?   General: Bowel sounds are normal. There is no distension.  ?   Palpations: Abdomen is soft. There is no mass.  ?   Tenderness:  There is no abdominal tenderness. There is no guarding or rebound.  ?Musculoskeletal:     ?   General: No tenderness. Normal range of motion.  ?   Cervical back: Normal range of motion and neck supple.  ?Skin: ?

## 2022-03-08 NOTE — Assessment & Plan Note (Addendum)
#  Left breast ipsilateral chest wall recurrence stage IV [2015]-bone/liver metastases/lung nodules ; currently on Xeloda [since December 2022]- CT scan March 10th, 2023-shows interval enlargement of subpleural nodules of the medial right lower lobe, consistent with worsened metastatic disease. Interval enlargement of a subtly hypodense lesion of the anterior left lobe of the liver, consistent with worsened hepatic metastatic Disease. Extensive new and increased osseous sclerosis involving the vertebral bodies, ribs, and bony pelvis, consistent with post treatment sclerosis of osseous metastatic disease.  Discussed regarding a possible liver biopsy-for NGS testing-PDL status in future.  ? ?#Discontinue Ellis Savage the above progression of disease.  Recommend starting Taxol weekly 3 weeks on 1 week off.  Patient previously received Taxol in 2007.  Patient understands treatments are palliative not curative. ? ?Discussed the potential side effects including but not limited to-increasing fatigue, nausea vomiting, diarrhea, hair loss, sores in the mouth, increase risk of infection and also neuropathy.  Also recommend chemo education.  Port placement.  Sent antiemetics; Emla cream. ? ?# Bilateral hip pain- arthritis-[s/p ortho evaluation]; on meloxicam. STABLE.  ? ?# HTN- continue amlodipine/Lisinopril-systolic blood pressure 161W.  Monitor closely. ? ?# Bone lesions: Zometa-patient would benefit from Zometa.  Will order. This will need to be scheduled in future/visits [will need dental clearance] ? ?# Mild anemia hemoglobin- 11-12/ CKD-STABLE;    ? ?# CKD- stage III- GFR 48-50-STABLE;   ? ?# Anxiety/depression- celexa- STABLE;   ? ?#Incidental findings on Imaging  CT march, 2023:5. Emphysema, Coronary artery disease, aortic Atherosclerosis  and Emphysema ;I reviewed/discussed/counseled the patient.  ? ?# IV access: Recommend port placement we will make a referral to IR. ? ?I spoke at length with the patient's family  [sister, robin & daughter-Teresa]- regarding the patient's clinical status/plan of care.  Family agreement. Discussed regarding a possible liver biopsy-for NGS testing-PDL status in future.  ? ?# DISPOSITION:  ?# chemo education- Taxol weekly-  ?# IR referral for port placement ?# follow up in 2 weeks--; MD; cbc/cmp;CEA/ca27-29; Taxol [New]- Dr.B ? ? ?# I reviewed the blood work- with the patient in detail; also reviewed the imaging independently [as summarized above]; and with the patient in detail.  ? ?# 40 minutes face-to-face with the patient discussing the above plan of care; more than 50% of time spent on prognosis/ natural history; counseling and coordination. ? ? ? ? ? ? ?

## 2022-03-08 NOTE — Progress Notes (Signed)
DISCONTINUE ON PATHWAY REGIMEN - Breast ? ? ?  A cycle is every 28 days: ?    Abemaciclib  ?    Fulvestrant  ?    Fulvestrant  ? ?**Always confirm dose/schedule in your pharmacy ordering system** ? ?REASON: Disease Progression ?PRIOR TREATMENT: [id number]: Abemaciclib 150 mg BID + Fulvestrant 500 mg q28 Days Until Progression or Unacceptable Toxicity ?TREATMENT RESPONSE: Progressive Disease (PD) ? ?START ON PATHWAY REGIMEN - Breast ? ? ?  A cycle is every 28 days (3 weeks on and 1 week off): ?    Paclitaxel  ? ?**Always confirm dose/schedule in your pharmacy ordering system** ? ?Patient Characteristics: ?Distant Metastases or Locoregional Recurrent Disease - Unresected or Locally Advanced Unresectable Disease Progressing after Neoadjuvant and Local Therapies, HER2 Low/Negative/Unknown, ER Positive, Chemotherapy, HER2 Negative/Unknown, First Line ?Therapeutic Status: Distant Metastases ?HER2 Status: Negative (-) ?ER Status: Positive (+) ?PR Status: Negative (-) ?Therapy Approach Indicated: Standard Chemotherapy/Endocrine Therapy ?Line of Therapy: First Line ?Intent of Therapy: ?Non-Curative / Palliative Intent, Discussed with Patient ?

## 2022-03-08 NOTE — Telephone Encounter (Signed)
Order placed for IR port placement at [facility]. Order faxed to [place]. ? ?Will contact pt once we get notification appt has been made. ?

## 2022-03-09 ENCOUNTER — Encounter: Payer: Self-pay | Admitting: Internal Medicine

## 2022-03-09 LAB — CANCER ANTIGEN 27.29: CA 27.29: 116.9 U/mL — ABNORMAL HIGH (ref 0.0–38.6)

## 2022-03-09 LAB — CEA: CEA: 5.5 ng/mL — ABNORMAL HIGH (ref 0.0–4.7)

## 2022-03-09 NOTE — Telephone Encounter (Signed)
Daughter, Helene Kelp, called to follow-up on phone details to her mother. Patient can not remember details of call. ? ?I called daughter back and reviewed the apt times for port placement- arrival time and date and future apts for the patient. Daughter thanked me for reaching out to her. ? ?

## 2022-03-09 NOTE — Telephone Encounter (Signed)
Appt 03/13/22 9 am arrival for 10 am appt, pt notified. ?

## 2022-03-10 ENCOUNTER — Other Ambulatory Visit (HOSPITAL_COMMUNITY): Payer: Self-pay

## 2022-03-10 ENCOUNTER — Other Ambulatory Visit: Payer: Self-pay | Admitting: Radiology

## 2022-03-10 NOTE — Progress Notes (Signed)
Called and spoke to patient on 03/10/22 at 1215 about her appointment on Monday, 03/13/22. Told patient to be NPO after midnight and to be here at 9:00 for a 10:00 procedure. Pt states her daughter will be driving her for the procedure.  ?

## 2022-03-13 ENCOUNTER — Encounter: Payer: Self-pay | Admitting: Radiology

## 2022-03-13 ENCOUNTER — Ambulatory Visit
Admission: RE | Admit: 2022-03-13 | Discharge: 2022-03-13 | Disposition: A | Discharge status: Home / Self Care | Payer: Medicare HMO | Source: Ambulatory Visit | Attending: Internal Medicine | Admitting: Internal Medicine

## 2022-03-13 DIAGNOSIS — C50912 Malignant neoplasm of unspecified site of left female breast: Secondary | ICD-10-CM | POA: Diagnosis not present

## 2022-03-13 DIAGNOSIS — Z79899 Other long term (current) drug therapy: Secondary | ICD-10-CM | POA: Insufficient documentation

## 2022-03-13 DIAGNOSIS — Z170 Estrogen receptor positive status [ER+]: Secondary | ICD-10-CM | POA: Insufficient documentation

## 2022-03-13 DIAGNOSIS — I10 Essential (primary) hypertension: Secondary | ICD-10-CM | POA: Diagnosis not present

## 2022-03-13 DIAGNOSIS — F17210 Nicotine dependence, cigarettes, uncomplicated: Secondary | ICD-10-CM | POA: Insufficient documentation

## 2022-03-13 DIAGNOSIS — E119 Type 2 diabetes mellitus without complications: Secondary | ICD-10-CM | POA: Diagnosis not present

## 2022-03-13 DIAGNOSIS — J449 Chronic obstructive pulmonary disease, unspecified: Secondary | ICD-10-CM | POA: Insufficient documentation

## 2022-03-13 DIAGNOSIS — E785 Hyperlipidemia, unspecified: Secondary | ICD-10-CM | POA: Diagnosis not present

## 2022-03-13 DIAGNOSIS — C50812 Malignant neoplasm of overlapping sites of left female breast: Secondary | ICD-10-CM | POA: Insufficient documentation

## 2022-03-13 DIAGNOSIS — Z452 Encounter for adjustment and management of vascular access device: Secondary | ICD-10-CM | POA: Diagnosis not present

## 2022-03-13 HISTORY — PX: IR IMAGING GUIDED PORT INSERTION: IMG5740

## 2022-03-13 MED ORDER — SODIUM CHLORIDE 0.9 % IV SOLN
INTRAVENOUS | Status: DC
  Filled 2022-03-13: qty 1000

## 2022-03-13 MED ORDER — LIDOCAINE HCL 1 % IJ SOLN
INTRAMUSCULAR | Status: AC
  Administered 2022-03-13: 15 mL
  Filled 2022-03-13: qty 20

## 2022-03-13 MED ORDER — FENTANYL CITRATE (PF) 100 MCG/2ML IJ SOLN
INTRAMUSCULAR | Status: DC | PRN
  Administered 2022-03-13 (×2): 50 ug via INTRAVENOUS

## 2022-03-13 MED ORDER — MIDAZOLAM HCL 2 MG/2ML IJ SOLN
INTRAMUSCULAR | Status: AC
  Filled 2022-03-13: qty 2

## 2022-03-13 MED ORDER — HEPARIN SOD (PORK) LOCK FLUSH 100 UNIT/ML IV SOLN
INTRAVENOUS | Status: AC
  Administered 2022-03-13: 500 [IU]
  Filled 2022-03-13: qty 5

## 2022-03-13 MED ORDER — FENTANYL CITRATE (PF) 100 MCG/2ML IJ SOLN
INTRAMUSCULAR | Status: AC
  Filled 2022-03-13: qty 2

## 2022-03-13 MED ORDER — MIDAZOLAM HCL 5 MG/5ML IJ SOLN
INTRAMUSCULAR | Status: DC | PRN
  Administered 2022-03-13: .5 mg via INTRAVENOUS
  Administered 2022-03-13: 1 mg via INTRAVENOUS
  Administered 2022-03-13: .5 mg via INTRAVENOUS

## 2022-03-13 NOTE — H&P (Signed)
? ?Chief Complaint: ?Patient was seen in consultation today for metastatic breast cancer at the request of Brahmanday,Govinda R ? ?Referring Physician(s): ?Brahmanday,Govinda R ? ?Supervising Physician: Aletta Edouard ? ?Patient Status: [name] ? ?History of Present Illness: ?Jessica Compton is a 81 y.o. female with Left sided breast cancer now with metastatic disease who follows with oncology and seen on 3/15 with plans for port a catheter placement for palliative chemotherapy. No medical changes since last office visit.  ? ?The patient has had a H&P performed within the last 30 days, all history, medications, and exam have been reviewed. The patient denies any interval changes since the H&P. ? ?The patient denies any current chest pain or shortness of breath. The patient denies any recent infections, fever or chills. The patient denies any history of sleep apnea or chronic oxygen use. She has no known complications to sedation.  ? ? ?Past Medical History:  ?Diagnosis Date  ? Aneurysm ([place]) 2007  ? Arthritis   ? Cancer [facility]) 2007  ? diagnosed 01/07 left breast with simple mastectomu & sn bx. The pt had a 3.5cm tumor. Frozen section report on the 2 sn were negative. On permanent sections a 0.66m micrometastasis was identified. She was not felt to require a complete axillary dissection. She has completed her Tamoxifen therapy and has been released from routine f/u with medical oncology service.  ? COPD (chronic obstructive pulmonary disease) ([place]   ? Diabetes mellitus without complication ([place]   ? Hyperlipidemia   ? Hypertension   ? since age 81 ? Malignant ne[place] of upper-outer quadrant of [place] breast [place] 12/2013  ? Mastectomy site recurrence resected 01/26/2014, ER 90%, PR 0%, HER-2/neu nonamplified.  ? Other benign neoplasm of connective and other soft tissue of trunk, unspecified 2014  ? Personal history of colonic polyps   ? Personal history of tobacco use, presenting hazards to health   ?  Special screening for malignant neoplasms, colon   ? Unspecified disorder of skin and subcutaneous tissue 02/19/2013  ? biopsy of skin over the sternum on the right breast showed hypertrophic scar,keloid formation.  ? ? ?Past Surgical History:  ?Procedure Laterality Date  ? ABDOMINAL HYSTERECTOMY  2004  ? total  ? BREAST BIOPSY Right January 26, 2014  ? Core biopsy for mild increase breast uptake on PET scan, usual ductal hyperplasia and stromal fibrosis  ? BREAST SURGERY Left 2007  ? mastectomy  ? chest wall mass  01/26/14  ? CHOLECYSTECTOMY  2007  ? COLONOSCOPY W/ POLYPECTOMY  2011  ? Dr. EBrynda Greathouse ? COLONOSCOPY WITH PROPOFOL N/A 12/22/2021  ? Procedure: COLONOSCOPY WITH PROPOFOL;  Surgeon: AJonathon Bellows MD;  [facility];  Service: Gastroenterology;  Laterality: N/A;  ? EYE SURGERY Right   ? cataract surgery  ? head surgery  1991  ? MASTECTOMY Left 2007  ? PORT-A-CATH REMOVAL  2008  ? PORTACATH PLACEMENT  2007  ? ? ?Allergies: ?Metoprolol ? ?Medications: ?Prior to Admission medications   ?Medication Sig Start Date End Date Taking? Authorizing Provider  ?amLODipine (NORVASC) 10 MG tablet TAKE 1 TABLET(10 MG) BY MOUTH DAILY 09/12/21  Yes BCammie Sickle MD  ?atorvastatin (LIPITOR) 20 MG tablet Take 20 mg by mouth daily at 6 PM. 08/14/14  Yes [provider]  ?Calcium Carbonate-Vitamin D 600-200 MG-UNIT CAPS Take 200 capsules by mouth once. 07/20/15  Yes [provider]  ?capecitabine (XELODA) 500 MG tablet Take 3 tablets (1,500 mg total) by mouth 2 (two)  times daily after a meal. Take for 14 days, then hold for 7 days. Repeat every 21 days. Start on 12/15/21. 12/28/21  Yes [name], RPH-CPP  ?citalopram (CELEXA) 40 MG tablet TAKE 1 TABLET(40 MG) BY MOUTH DAILY 02/27/22  Yes [name], MD  ?lisinopril (ZESTRIL) 2.5 MG tablet Take 1 tablet (2.5 mg total) by mouth daily. 12/30/21  Yes [name], MD  ?meloxicam (MOBIC) 15 MG tablet Take 1 tablet (15 mg total) by  mouth daily. 11/30/21  Yes [name], MD  ?lidocaine-prilocaine (EMLA) cream Apply on the port. 30 -45 min  prior to port access. 03/08/22   [name], MD  ?ondansetron (ZOFRAN) 8 MG tablet One pill every 8 hours as needed for nausea/vomitting. 03/08/22   [name], MD  ?prochlorperazine (COMPAZINE) 10 MG tablet Take 1 tablet (10 mg total) by mouth every 6 (six) hours as needed for nausea or vomiting. 03/08/22   [name], MD  ?  ? ?Family History  ?Problem Relation Age of Onset  ? Cancer Other   ?     ovarian,breast,colon cancers;relations not listed  ? Breast cancer Neg Hx   ? ? ?Social History  ? ?Socioeconomic History  ? Marital status: Single  ?  Spouse name: Not on file  ? Number of children: Not on file  ? Years of education: Not on file  ? Highest education level: Not on file  ?Occupational History  ? Not on file  ?Tobacco Use  ? Smoking status: Some Days  ?  Packs/day: 1.00  ?  Years: 50.00  ?  Pack years: 50.00  ?  Types: Cigarettes  ? Smokeless tobacco: Never  ?Vaping Use  ? Vaping Use: Some days  ?Substance and Sexual Activity  ? Alcohol use: No  ?  Alcohol/week: 0.0 standard drinks  ? Drug use: No  ? Sexual activity: Not on file  ?Other Topics Concern  ? Not on file  ?Social History Narrative  ? Not on file  ? ?Social Determinants of Health  ? ?Financial Resource Strain: Not on file  ?Food Insecurity: Not on file  ?Transportation Needs: Not on file  ?Physical Activity: Not on file  ?Stress: Not on file  ?Social Connections: Not on file  ? ?Review of Systems: A 12 point ROS discussed and pertinent positives are indicated in the HPI above.  All other systems are negative. ? ?Review of Systems ? ?Vital Signs: ?BP 133/74   Pulse 75   Resp 14   Ht $R'5\' 5"'TO$  (1.651 m)   Wt 151 lb (68.5 kg)   SpO2 99%   BMI 25.13 kg/m?  ? ?Physical Exam ?Constitutional:   ?   Appearance: Normal appearance.  ?Cardiovascular:  ?   Rate and Rhythm: Normal rate and regular rhythm.   ?Pulmonary:  ?   Effort: Pulmonary effort is normal. No respiratory distress.  ?Neurological:  ?   Mental Status: She is alert and oriented to person, place, and time.  ? ? ?Imaging: ?CT CHEST ABDOMEN PELVIS W CONTRAST ? ?Result Date: 03/03/2022 ?CLINICAL DATA:  Metastatic left breast cancer restaging, ongoing chemotherapy EXAM: CT CHEST, ABDOMEN, AND PELVIS WITH CONTRAST TECHNIQUE: Multidetector CT imaging of the chest, abdomen and pelvis was performed following the standard protocol during bolus administration of intravenous contrast. RADIATION DOSE REDUCTION: This exam was performed according to the departmental dose-optimization program which includes automated exposure control, adjustment of the mA and/or kV according to patient size and/or use  of iterative reconstruction technique. CONTRAST:  148m OMNIPAQUE IOHEXOL 300 MG/ML SOLN, additional oral enteric contrast COMPARISON:  PET-CT, 10/31/2021, CT chest abdomen pelvis, 10/19/2021 FINDINGS: CT CHEST FINDINGS Cardiovascular: Aortic atherosclerosis. Normal heart size. Three-vessel coronary artery calcifications. No pericardial effusion. Mediastinum/Nodes: No enlarged mediastinal, hilar, or axillary lymph nodes. Thyroid gland, trachea, and esophagus demonstrate no significant findings. Lungs/Pleura: Severe centrilobular emphysema. Interval enlargement of a subpleural nodule of the medial right lower lobe overlying the right aspect of the T11 vertebral body, measuring 2.1 x 1.2 cm, previously 1.8 x 1.0 cm when measured similarly (series 4, image 115). Interval enlargement of an additional subpleural nodule within the azygoesophageal recess of the right lower lobe, measuring 0.5 x 0.5 cm, previously 0.5 x 0.3 cm (series 4, image 105). Other small nodules about the right lung base are not significantly changed (, for example a 0.6 x 0.5 cm nodule of the dependent right costophrenic recess (series 4, image 122). No pleural effusion or pneumothorax. Musculoskeletal:  Status post left mastectomy. Extensive new and increased osseous sclerosis involving the vertebral bodies, ribs, and bony pelvis. Subtly lytic lesions of the sternal body (series 6, image 96). CT ABDOMEN PELVIS FIND

## 2022-03-13 NOTE — Progress Notes (Signed)
Patient resting with daughter present. Dr Kathlene Cote in to speak with patient and daughter with update given. Vitals stable. Discharge instructions given.  ?

## 2022-03-13 NOTE — Procedures (Signed)
Interventional Radiology Procedure Note  Procedure: Single Lumen Power Port Placement    Access:  Right IJ vein.  Findings: Catheter tip positioned at SVC/RA junction. Port is ready for immediate use.   Complications: None  EBL: < 10 mL  Recommendations:  - Ok to shower in 24 hours - Do not submerge for 7 days - Routine line care   Kaidin Boehle T. Mozes Sagar, M.D Pager:  [phone]   

## 2022-03-14 ENCOUNTER — Other Ambulatory Visit: Payer: Self-pay

## 2022-03-14 ENCOUNTER — Inpatient Hospital Stay: Payer: Medicare HMO

## 2022-03-22 ENCOUNTER — Inpatient Hospital Stay: Payer: Medicare HMO

## 2022-03-22 ENCOUNTER — Encounter: Payer: Self-pay | Admitting: Nurse Practitioner

## 2022-03-22 ENCOUNTER — Inpatient Hospital Stay (HOSPITAL_BASED_OUTPATIENT_CLINIC_OR_DEPARTMENT_OTHER): Payer: Medicare HMO | Admitting: Nurse Practitioner

## 2022-03-22 ENCOUNTER — Other Ambulatory Visit: Payer: Self-pay

## 2022-03-22 VITALS — BP 152/80 | HR 78 | Temp 99.0°F | Resp 16 | Ht 66.0 in | Wt 151.7 lb

## 2022-03-22 VITALS — BP 160/77 | HR 76 | Temp 98.6°F | Resp 18

## 2022-03-22 DIAGNOSIS — Z5111 Encounter for antineoplastic chemotherapy: Secondary | ICD-10-CM

## 2022-03-22 DIAGNOSIS — C7951 Secondary malignant neoplasm of bone: Secondary | ICD-10-CM | POA: Diagnosis not present

## 2022-03-22 DIAGNOSIS — C50919 Malignant neoplasm of unspecified site of unspecified female breast: Secondary | ICD-10-CM | POA: Diagnosis not present

## 2022-03-22 DIAGNOSIS — Z170 Estrogen receptor positive status [ER+]: Secondary | ICD-10-CM | POA: Diagnosis not present

## 2022-03-22 DIAGNOSIS — C787 Secondary malignant neoplasm of liver and intrahepatic bile duct: Secondary | ICD-10-CM | POA: Diagnosis not present

## 2022-03-22 DIAGNOSIS — Z79899 Other long term (current) drug therapy: Secondary | ICD-10-CM | POA: Diagnosis not present

## 2022-03-22 DIAGNOSIS — C50812 Malignant neoplasm of overlapping sites of left female breast: Secondary | ICD-10-CM | POA: Diagnosis not present

## 2022-03-22 LAB — COMPREHENSIVE METABOLIC PANEL
ALT: 12 U/L (ref 0–44)
AST: 29 U/L (ref 15–41)
Albumin: 4 g/dL (ref 3.5–5.0)
Alkaline Phosphatase: 87 U/L (ref 38–126)
Anion gap: 8 (ref 5–15)
BUN: 17 mg/dL (ref 8–23)
CO2: 25 mmol/L (ref 22–32)
Calcium: 8.9 mg/dL (ref 8.9–10.3)
Chloride: 102 mmol/L (ref 98–111)
Creatinine, Ser: 0.93 mg/dL (ref 0.44–1.00)
GFR, Estimated: >60 mL/min (ref >60)
Glucose, Bld: 138 mg/dL — ABNORMAL HIGH (ref 70–99)
Potassium: 3.7 mmol/L (ref 3.5–5.1)
Sodium: 135 mmol/L (ref 135–145)
Total Bilirubin: 0.6 mg/dL (ref 0.3–1.2)
Total Protein: 7.8 g/dL (ref 6.5–8.1)

## 2022-03-22 LAB — CBC WITH DIFFERENTIAL/PLATELET
Abs Immature Granulocytes: 0.01 10*3/uL (ref 0.00–0.07)
Basophils Absolute: 0 10*3/uL (ref 0.0–0.1)
Basophils Relative: 1 %
Eosinophils Absolute: 0.2 10*3/uL (ref 0.0–0.5)
Eosinophils Relative: 3 %
HCT: 34.8 % — ABNORMAL LOW (ref 36.0–46.0)
Hemoglobin: 11 g/dL — ABNORMAL LOW (ref 12.0–15.0)
Immature Granulocytes: 0 %
Lymphocytes Relative: 31 %
Lymphs Abs: 2 10*3/uL (ref 0.7–4.0)
MCH: 31.6 pg (ref 26.0–34.0)
MCHC: 31.6 g/dL (ref 30.0–36.0)
MCV: 100 fL (ref 80.0–100.0)
Monocytes Absolute: 0.7 10*3/uL (ref 0.1–1.0)
Monocytes Relative: 11 %
Neutro Abs: 3.6 10*3/uL (ref 1.7–7.7)
Neutrophils Relative %: 54 %
Platelets: 211 10*3/uL (ref 150–400)
RBC: 3.48 MIL/uL — ABNORMAL LOW (ref 3.87–5.11)
RDW: 16.5 % — ABNORMAL HIGH (ref 11.5–15.5)
WBC: 6.5 10*3/uL (ref 4.0–10.5)
nRBC: 0 % (ref 0.0–0.2)

## 2022-03-22 MED ORDER — FAMOTIDINE IN NACL 20-0.9 MG/50ML-% IV SOLN
20.0000 mg | Freq: Once | INTRAVENOUS | Status: AC
  Administered 2022-03-22: 20 mg via INTRAVENOUS
  Filled 2022-03-22: qty 50

## 2022-03-22 MED ORDER — DIPHENHYDRAMINE HCL 50 MG/ML IJ SOLN
50.0000 mg | Freq: Once | INTRAMUSCULAR | Status: AC
  Administered 2022-03-22: 50 mg via INTRAVENOUS
  Filled 2022-03-22: qty 1

## 2022-03-22 MED ORDER — HEPARIN SOD (PORK) LOCK FLUSH 100 UNIT/ML IV SOLN
500.0000 [IU] | Freq: Once | INTRAVENOUS | Status: AC | PRN
  Administered 2022-03-22: 500 [IU]
  Filled 2022-03-22: qty 5

## 2022-03-22 MED ORDER — SODIUM CHLORIDE 0.9 % IV SOLN
80.0000 mg/m2 | Freq: Once | INTRAVENOUS | Status: AC
  Administered 2022-03-22: 144 mg via INTRAVENOUS
  Filled 2022-03-22: qty 24

## 2022-03-22 MED ORDER — SODIUM CHLORIDE 0.9 % IV SOLN
Freq: Once | INTRAVENOUS | Status: AC
  Filled 2022-03-22: qty 250

## 2022-03-22 MED ORDER — SODIUM CHLORIDE 0.9 % IV SOLN
10.0000 mg | Freq: Once | INTRAVENOUS | Status: AC
  Administered 2022-03-22: 10 mg via INTRAVENOUS
  Filled 2022-03-22: qty 10

## 2022-03-22 NOTE — Progress Notes (Signed)
Patient here for treatment check no complaints. ?

## 2022-03-22 NOTE — Patient Instructions (Signed)
Paclitaxel injection ?What is this medication? ?PACLITAXEL (PAK li TAX el) is a chemotherapy drug. It targets fast dividing cells, like cancer cells, and causes these cells to die. This medicine is used to treat ovarian cancer, breast cancer, lung cancer, Kaposi's sarcoma, and other cancers. ?This medicine may be used for other purposes; ask your health care provider or pharmacist if you have questions. ?COMMON BRAND NAME(S): Onxol, Taxol ?What should I tell my care team before I take this medication? ?They need to know if you have any of these conditions: ?history of irregular heartbeat ?liver disease ?low blood counts, like low white cell, platelet, or red cell counts ?lung or breathing disease, like asthma ?tingling of the fingers or toes, or other nerve disorder ?an unusual or allergic reaction to paclitaxel, alcohol, polyoxyethylated castor oil, other chemotherapy, other medicines, foods, dyes, or preservatives ?pregnant or trying to get pregnant ?breast-feeding ?How should I use this medication? ?This drug is given as an infusion into a vein. It is administered in a hospital or clinic by a specially trained health care professional. ?Talk to your pediatrician regarding the use of this medicine in children. Special care may be needed. ?Overdosage: If you think you have taken too much of this medicine contact a poison control center or emergency room at once. ?NOTE: This medicine is only for you. Do not share this medicine with others. ?What if I miss a dose? ?It is important not to miss your dose. Call your doctor or health care professional if you are unable to keep an appointment. ?What may interact with this medication? ?Do not take this medicine with any of the following medications: ?live virus vaccines ?This medicine may also interact with the following medications: ?antiviral medicines for hepatitis, HIV or AIDS ?certain antibiotics like erythromycin and clarithromycin ?certain medicines for fungal  infections like ketoconazole and itraconazole ?certain medicines for seizures like carbamazepine, phenobarbital, phenytoin ?gemfibrozil ?nefazodone ?rifampin ?St. John's wort ?This list may not describe all possible interactions. Give your health care provider a list of all the medicines, herbs, non-prescription drugs, or dietary supplements you use. Also tell them if you smoke, drink alcohol, or use illegal drugs. Some items may interact with your medicine. ?What should I watch for while using this medication? ?Your condition will be monitored carefully while you are receiving this medicine. You will need important blood work done while you are taking this medicine. ?This medicine can cause serious allergic reactions. To reduce your risk you will need to take other medicine(s) before treatment with this medicine. If you experience allergic reactions like skin rash, itching or hives, swelling of the face, lips, or tongue, tell your doctor or health care professional right away. ?In some cases, you may be given additional medicines to help with side effects. Follow all directions for their use. ?This drug may make you feel generally unwell. This is not uncommon, as chemotherapy can affect healthy cells as well as cancer cells. Report any side effects. Continue your course of treatment even though you feel ill unless your doctor tells you to stop. ?Call your doctor or health care professional for advice if you get a fever, chills or sore throat, or other symptoms of a cold or flu. Do not treat yourself. This drug decreases your body's ability to fight infections. Try to avoid being around people who are sick. ?This medicine may increase your risk to bruise or bleed. Call your doctor or health care professional if you notice any unusual bleeding. ?Be careful brushing  and flossing your teeth or using a toothpick because you may get an infection or bleed more easily. If you have any dental work done, tell your dentist  you are receiving this medicine. ?Avoid taking products that contain aspirin, acetaminophen, ibuprofen, naproxen, or ketoprofen unless instructed by your doctor. These medicines may hide a fever. ?Do not become pregnant while taking this medicine. Women should inform their doctor if they wish to become pregnant or think they might be pregnant. There is a potential for serious side effects to an unborn child. Talk to your health care professional or pharmacist for more information. Do not breast-feed an infant while taking this medicine. ?Men are advised not to father a child while receiving this medicine. ?This product may contain alcohol. Ask your pharmacist or healthcare provider if this medicine contains alcohol. Be sure to tell all healthcare providers you are taking this medicine. Certain medicines, like metronidazole and disulfiram, can cause an unpleasant reaction when taken with alcohol. The reaction includes flushing, headache, nausea, vomiting, sweating, and increased thirst. The reaction can last from 30 minutes to several hours. ?What side effects may I notice from receiving this medication? ?Side effects that you should report to your doctor or health care professional as soon as possible: ?allergic reactions like skin rash, itching or hives, swelling of the face, lips, or tongue ?breathing problems ?changes in vision ?fast, irregular heartbeat ?high or low blood pressure ?mouth sores ?pain, tingling, numbness in the hands or feet ?signs of decreased platelets or bleeding - bruising, pinpoint red spots on the skin, black, tarry stools, blood in the urine ?signs of decreased red blood cells - unusually weak or tired, feeling faint or lightheaded, falls ?signs of infection - fever or chills, cough, sore throat, pain or difficulty passing urine ?signs and symptoms of liver injury like dark yellow or brown urine; general ill feeling or flu-like symptoms; light-colored stools; loss of appetite; nausea; right  upper belly pain; unusually weak or tired; yellowing of the eyes or skin ?swelling of the ankles, feet, hands ?unusually slow heartbeat ?Side effects that usually do not require medical attention (report to your doctor or health care professional if they continue or are bothersome): ?diarrhea ?hair loss ?loss of appetite ?muscle or joint pain ?nausea, vomiting ?pain, redness, or irritation at site where injected ?tiredness ?This list may not describe all possible side effects. Call your doctor for medical advice about side effects. You may report side effects to FDA at 1-800-FDA-1088. ?Where should I keep my medication? ?This drug is given in a hospital or clinic and will not be stored at home. ?NOTE: This sheet is a summary. It may not cover all possible information. If you have questions about this medicine, talk to your doctor, pharmacist, or health care provider. ?? 2022 Elsevier/Gold Standard (2021-08-30 00:00:00) ? ?[facility] AT [place]  Discharge Instructions: ?Thank you for choosing [facility] to provide your oncology and hematology care.  ?If you have a lab appointment with the [facility], please go directly to the [facility] and check in at the registration area. ? ?Wear comfortable clothing and clothing appropriate for easy access to any Portacath or PICC line.  ? ?We strive to give you quality time with your provider. You may need to reschedule your appointment if you arrive late (15 or more minutes).  Arriving late affects you and other patients whose appointments are after yours.  Also, if you miss three or more appointments without notifying the  office, you may be dismissed from the clinic at the provider?s discretion.    ?  ?For prescription refill requests, have your pharmacy contact our office and allow 72 hours for refills to be completed.   ? ?Today you received the following chemotherapy and/or immunotherapy agents Taxol    ?  ?To help prevent nausea  and vomiting after your treatment, we encourage you to take your nausea medication as directed. ? ?BELOW ARE SYMPTOMS THAT SHOULD BE REPORTED IMMEDIATELY: ?*FEVER GREATER THAN 100.4 F (38 ?C) OR HIGHER ?*C

## 2022-03-22 NOTE — Progress Notes (Signed)
[place] ?OFFICE PROGRESS NOTE ? ?Patient Care Team: ?[name], MD as PCP - General (Internal Medicine) ?[name], MD (General Surgery) ?Marden Noble, MD (Internal Medicine) ?[name], MD as Consulting Physician (Internal Medicine) ? ?SUMMARY OF ONCOLOGIC HISTORY: ? ?Oncology History Overview Note  ?# 2007- LEFT BREAST CA STAGE II [T2N21m; s/p mastec; Dr.Byrnett] ER-Pos/PR Neg; Her- 2 Neu- NEG; ACx4-Taxol x12; Femara; stopped May 2009-stopped follow up. ? ?#JAN 2015- Post mastectomy Chest wall recurrence [chest wall mass bx-]; ER- 90%; PR- NEG; Her2 Neu- NEG; s/p excision [involving skeletal muscle/adipose tissue; clear margins]; CT July 2017- NED;  ? ?# Jan 2020- progression/ hilar- START abema [jan 14th]+ faslodex[jan 13th]; OCT-NOV 2022-PET scan shows progressive disease in the bones to liver.  Discontinue Abema+ Faslodex ? ?# DEC 1st, 2022- START Xeloda 2w/1w; MARCH 10th, 2023-progressive disease-lung bone liver ? ?# end of March 2023- taxol weekly ? ?# DEC 2021- anemia/? CKD [stool ];#Ascending colon mass-incidentally noted on PET scan.  S/p  Dr. AVicente Males  colonoscpy DEC 2022- NEGATIVE; reviewed small adenomas.  ? ?# OSTEOPENIA [BMD- June 2016] ? ?# Kidney cysts ? ?DIAGNOSIS: LEFT BREAST CA ? ?STAGE: IV- NED  ;GOALS: pallaitive ? ? ? ?  ?Breast cancer metastasized to skin ([facility]  ?02/05/2014 Initial Diagnosis  ? Breast cancer metastasized to skin ([facility] ?  ?Malignant ne[place] of overlapping sites of left breast in [place], [place] receptor positive [place]  ?07/24/2016 Initial Diagnosis  ? Cancer of overlapping sites of left female breast ([facility] ?  ?01/06/2019 - 08/05/2020 Chemotherapy  ? Patient is on Treatment Plan : BREAST Abemaciclib + Fulvestrant q28d  ?   ?03/22/2022 -  Chemotherapy  ? Patient is on Treatment Plan : BREAST Paclitaxel D1,8,15 q28d  ?   ? ?INTERVAL HISTORY: Accompanied by daughter; Ambulating independently. ? ?A very pleasant 81 year old  African-American female patient with above history of ER/PR positive breast cancer stage IV, progressed on xeloda, here for consideration of new Taxol chemotherapy given with palliative intent.  She has chronic hip pain which is stable and unchanged.  Denies any nausea, vomiting, abdominal pain.  No fevers or chills.  No rashes.  No mouth sores.  No diarrhea or constipation. Her son lives with her. Daughter is nearby. Reports good family support.  ? ?Review of Systems  ?Constitutional:  Positive for malaise/fatigue. Negative for chills, diaphoresis, fever and weight loss.  ?HENT:  Negative for nosebleeds and sore throat.   ?Eyes:  Negative for double vision.  ?Respiratory:  Negative for cough, hemoptysis, sputum production, shortness of breath and wheezing.   ?Cardiovascular:  Negative for chest pain, palpitations, orthopnea and leg swelling.  ?Gastrointestinal:  Negative for abdominal pain, blood in stool, constipation, diarrhea, heartburn, melena, nausea and vomiting.  ?Musculoskeletal:  Positive for back pain and joint pain.  ?Skin: Negative.  Negative for itching and rash.  ?Neurological:  Negative for tingling, focal weakness, weakness and headaches.  ?Endo/Heme/Allergies:  Does not bruise/bleed easily.  ?Psychiatric/Behavioral:  Negative for depression. The patient is not nervous/anxious and does not have insomnia.   ? ?PAST MEDICAL HISTORY :  ?Past Medical History:  ?Diagnosis Date  ? [place]  ? Arthritis   ? Cancer ([facility]  ? diagnosed 01/07 left breast with simple mastectomu & sn bx. The pt had a 3.5cm tumor. Frozen section report on the 2 sn were negative. On permanent sections a 0.51mmicrometastasis was identified. She was not felt to require a complete axillary dissection.  She has completed her Tamoxifen therapy and has been released from routine f/u with medical oncology service.  ? COPD (chronic obstructive pulmonary disease) ([place])   ? Diabetes mellitus without complication ([place])   ?  Hyperlipidemia   ? Hypertension   ? since age 66  ? Malignant neoplasm of upper-outer quadrant of female breast ([place]) 12/2013  ? Mastectomy site recurrence resected 01/26/2014, ER 90%, PR 0%, HER-2/neu nonamplified.  ? Other benign neoplasm of connective and other soft tissue of trunk, unspecified 2014  ? Personal history of colonic polyps   ? Personal history of tobacco use, presenting hazards to health   ? Special screening for malignant neoplasms, colon   ? Unspecified disorder of skin and subcutaneous tissue 02/19/2013  ? biopsy of skin over the sternum on the right breast showed hypertrophic scar,keloid formation.  ? ? ?PAST SURGICAL HISTORY :   ?Past Surgical History:  ?Procedure Laterality Date  ? ABDOMINAL HYSTERECTOMY  2004  ? total  ? BREAST BIOPSY Right January 26, 2014  ? Core biopsy for mild increase breast uptake on PET scan, usual ductal hyperplasia and stromal fibrosis  ? BREAST SURGERY Left 2007  ? mastectomy  ? chest wall mass  01/26/14  ? CHOLECYSTECTOMY  2007  ? COLONOSCOPY W/ POLYPECTOMY  2011  ? Dr. Brynda Greathouse  ? COLONOSCOPY WITH PROPOFOL N/A 12/22/2021  ? Procedure: COLONOSCOPY WITH PROPOFOL;  Surgeon: Jonathon Bellows, MD;  Location[facility] ENDOSCOPY;  Service: Gastroenterology;  Laterality: N/A;  ? EYE SURGERY Right   ? cataract surgery  ? head surgery  1991  ? IR IMAGING GUIDED PORT INSERTION  03/13/2022  ? MASTECTOMY Left 2007  ? PORT-A-CATH REMOVAL  2008  ? PORTACATH PLACEMENT  2007  ? ? ?FAMILY HISTORY :   ?Family History  ?Problem Relation Age of Onset  ? Cancer Other   ?     ovarian,breast,colon cancers;relations not listed  ? Breast cancer Neg Hx   ? ? ?SOCIAL HISTORY:   ?Social History  ? ?Tobacco Use  ? Smoking status: Some Days  ?  Packs/day: 1.00  ?  Years: 50.00  ?  Pack years: 50.00  ?  Types: Cigarettes  ? Smokeless tobacco: Never  ?Vaping Use  ? Vaping Use: Some days  ?Substance Use Topics  ? Alcohol use: No  ?  Alcohol/week: 0.0 standard drinks  ? Drug use: No  ? ? ?ALLERGIES:  is allergic  to metoprolol. ? ?MEDICATIONS:  ?Current Outpatient Medications  ?Medication Sig Dispense Refill  ? amLODipine (NORVASC) 10 MG tablet TAKE 1 TABLET(10 MG) BY MOUTH DAILY 90 tablet 1  ? atorvastatin (LIPITOR) 20 MG tablet Take 20 mg by mouth daily at 6 PM.    ? citalopram (CELEXA) 40 MG tablet TAKE 1 TABLET(40 MG) BY MOUTH DAILY 90 tablet 1  ? lidocaine-prilocaine (EMLA) cream Apply on the port. 30 -45 min  prior to port access. 30 g 3  ? lisinopril (ZESTRIL) 2.5 MG tablet Take 1 tablet (2.5 mg total) by mouth daily. 90 tablet 1  ? meloxicam (MOBIC) 15 MG tablet Take 1 tablet (15 mg total) by mouth daily. 60 tablet 1  ? ondansetron (ZOFRAN) 8 MG tablet One pill every 8 hours as needed for nausea/vomitting. 40 tablet 1  ? prochlorperazine (COMPAZINE) 10 MG tablet Take 1 tablet (10 mg total) by mouth every 6 (six) hours as needed for nausea or vomiting. 40 tablet 1  ? capecitabine (XELODA) 500 MG tablet Take 3 tablets (1,500 mg total)  by mouth 2 (two) times daily after a meal. Take for 14 days, then hold for 7 days. Repeat every 21 days. Start on 12/15/21. (Patient not taking: Reported on 03/22/2022) 84 tablet 1  ? ?No current facility-administered medications for this visit.  ? ? ?PHYSICAL EXAMINATION: ?ECOG PERFORMANCE STATUS: 0 - Asymptomatic ? ?BP (!) 152/80 (BP Location: Right Arm, Patient Position: Sitting, Cuff Size: Normal)   Pulse 78   Temp 99 ?F (37.2 ?C) (Tympanic)   Resp 16   Ht 5' 6"  (1.676 m)   Wt 151 lb 11.2 oz (68.8 kg)   SpO2 99%   BMI 24.49 kg/m?  ? Danley Danker Weights  ? 03/22/22 0946  ?Weight: 151 lb 11.2 oz (68.8 kg)  ? ? ?Physical Exam ?Constitutional:   ?   Appearance: She is not ill-appearing.  ?Eyes:  ?   General: No scleral icterus. ?   Conjunctiva/sclera: Conjunctivae normal.  ?Cardiovascular:  ?   Rate and Rhythm: Normal rate and regular rhythm.  ?Abdominal:  ?   General: There is no distension.  ?   Palpations: Abdomen is soft.  ?   Tenderness: There is no abdominal tenderness. There is no  guarding.  ?Musculoskeletal:     ?   General: No deformity.  ?   Right lower leg: No edema.  ?   Left lower leg: No edema.  ?Lymphadenopathy:  ?   Cervical: No cervical adenopathy.  ?Skin: ?   General: Skin is warm and

## 2022-03-23 LAB — CANCER ANTIGEN 27.29: CA 27.29: 125.4 U/mL — ABNORMAL HIGH (ref 0.0–38.6)

## 2022-03-27 ENCOUNTER — Other Ambulatory Visit: Payer: Self-pay | Admitting: Internal Medicine

## 2022-03-28 ENCOUNTER — Other Ambulatory Visit: Payer: Medicare HMO

## 2022-03-28 ENCOUNTER — Ambulatory Visit: Payer: Medicare HMO | Admitting: Nurse Practitioner

## 2022-03-28 ENCOUNTER — Ambulatory Visit: Payer: Medicare HMO

## 2022-03-30 ENCOUNTER — Inpatient Hospital Stay: Payer: Medicare HMO

## 2022-03-30 ENCOUNTER — Ambulatory Visit: Payer: Medicare HMO

## 2022-03-30 ENCOUNTER — Inpatient Hospital Stay (HOSPITAL_BASED_OUTPATIENT_CLINIC_OR_DEPARTMENT_OTHER): Payer: Medicare HMO | Admitting: Nurse Practitioner

## 2022-03-30 ENCOUNTER — Encounter: Payer: Self-pay | Admitting: Nurse Practitioner

## 2022-03-30 ENCOUNTER — Inpatient Hospital Stay: Payer: Medicare HMO | Attending: Internal Medicine

## 2022-03-30 VITALS — BP 146/77 | HR 75 | Temp 97.6°F | Resp 16 | Ht 66.0 in | Wt 152.6 lb

## 2022-03-30 DIAGNOSIS — C50812 Malignant neoplasm of overlapping sites of left female breast: Secondary | ICD-10-CM

## 2022-03-30 DIAGNOSIS — Z5111 Encounter for antineoplastic chemotherapy: Secondary | ICD-10-CM | POA: Insufficient documentation

## 2022-03-30 DIAGNOSIS — C787 Secondary malignant neoplasm of liver and intrahepatic bile duct: Secondary | ICD-10-CM | POA: Diagnosis not present

## 2022-03-30 DIAGNOSIS — Z170 Estrogen receptor positive status [ER+]: Secondary | ICD-10-CM | POA: Insufficient documentation

## 2022-03-30 DIAGNOSIS — C7951 Secondary malignant neoplasm of bone: Secondary | ICD-10-CM

## 2022-03-30 DIAGNOSIS — M899 Disorder of bone, unspecified: Secondary | ICD-10-CM | POA: Insufficient documentation

## 2022-03-30 DIAGNOSIS — C782 Secondary malignant neoplasm of pleura: Secondary | ICD-10-CM | POA: Insufficient documentation

## 2022-03-30 DIAGNOSIS — C50919 Malignant neoplasm of unspecified site of unspecified female breast: Secondary | ICD-10-CM | POA: Diagnosis not present

## 2022-03-30 LAB — CBC WITH DIFFERENTIAL/PLATELET
Abs Immature Granulocytes: 0.02 10*3/uL (ref 0.00–0.07)
Basophils Absolute: 0 10*3/uL (ref 0.0–0.1)
Basophils Relative: 1 %
Eosinophils Absolute: 0.1 10*3/uL (ref 0.0–0.5)
Eosinophils Relative: 3 %
HCT: 32.6 % — ABNORMAL LOW (ref 36.0–46.0)
Hemoglobin: 10.5 g/dL — ABNORMAL LOW (ref 12.0–15.0)
Immature Granulocytes: 0 %
Lymphocytes Relative: 37 %
Lymphs Abs: 1.7 10*3/uL (ref 0.7–4.0)
MCH: 32.3 pg (ref 26.0–34.0)
MCHC: 32.2 g/dL (ref 30.0–36.0)
MCV: 100.3 fL — ABNORMAL HIGH (ref 80.0–100.0)
Monocytes Absolute: 0.4 10*3/uL (ref 0.1–1.0)
Monocytes Relative: 9 %
Neutro Abs: 2.3 10*3/uL (ref 1.7–7.7)
Neutrophils Relative %: 50 %
Platelets: 243 10*3/uL (ref 150–400)
RBC: 3.25 MIL/uL — ABNORMAL LOW (ref 3.87–5.11)
RDW: 15.9 % — ABNORMAL HIGH (ref 11.5–15.5)
WBC: 4.6 10*3/uL (ref 4.0–10.5)
nRBC: 0 % (ref 0.0–0.2)

## 2022-03-30 LAB — COMPREHENSIVE METABOLIC PANEL
ALT: 14 U/L (ref 0–44)
AST: 31 U/L (ref 15–41)
Albumin: 3.8 g/dL (ref 3.5–5.0)
Alkaline Phosphatase: 79 U/L (ref 38–126)
Anion gap: 8 (ref 5–15)
BUN: 15 mg/dL (ref 8–23)
CO2: 25 mmol/L (ref 22–32)
Calcium: 8.8 mg/dL — ABNORMAL LOW (ref 8.9–10.3)
Chloride: 101 mmol/L (ref 98–111)
Creatinine, Ser: 0.94 mg/dL (ref 0.44–1.00)
GFR, Estimated: >60 mL/min (ref >60)
Glucose, Bld: 142 mg/dL — ABNORMAL HIGH (ref 70–99)
Potassium: 3.5 mmol/L (ref 3.5–5.1)
Sodium: 134 mmol/L — ABNORMAL LOW (ref 135–145)
Total Bilirubin: 0.5 mg/dL (ref 0.3–1.2)
Total Protein: 7.7 g/dL (ref 6.5–8.1)

## 2022-03-30 MED ORDER — SODIUM CHLORIDE 0.9 % IV SOLN
80.0000 mg/m2 | Freq: Once | INTRAVENOUS | Status: AC
  Administered 2022-03-30: 144 mg via INTRAVENOUS
  Filled 2022-03-30: qty 24

## 2022-03-30 MED ORDER — DIPHENHYDRAMINE HCL 50 MG/ML IJ SOLN
50.0000 mg | Freq: Once | INTRAMUSCULAR | Status: AC
  Administered 2022-03-30: 50 mg via INTRAVENOUS
  Filled 2022-03-30: qty 1

## 2022-03-30 MED ORDER — HEPARIN SOD (PORK) LOCK FLUSH 100 UNIT/ML IV SOLN
500.0000 [IU] | Freq: Once | INTRAVENOUS | Status: AC | PRN
  Administered 2022-03-30: 500 [IU]
  Filled 2022-03-30: qty 5

## 2022-03-30 MED ORDER — HEPARIN SOD (PORK) LOCK FLUSH 100 UNIT/ML IV SOLN
500.0000 [IU] | Freq: Once | INTRAVENOUS | Status: DC
  Filled 2022-03-30: qty 5

## 2022-03-30 MED ORDER — SODIUM CHLORIDE 0.9% FLUSH
10.0000 mL | INTRAVENOUS | Status: DC | PRN
  Administered 2022-03-30: 10 mL via INTRAVENOUS
  Filled 2022-03-30: qty 10

## 2022-03-30 MED ORDER — SODIUM CHLORIDE 0.9 % IV SOLN
Freq: Once | INTRAVENOUS | Status: AC
  Filled 2022-03-30: qty 250

## 2022-03-30 MED ORDER — SODIUM CHLORIDE 0.9 % IV SOLN
10.0000 mg | Freq: Once | INTRAVENOUS | Status: AC
  Administered 2022-03-30: 10 mg via INTRAVENOUS
  Filled 2022-03-30: qty 10

## 2022-03-30 MED ORDER — FAMOTIDINE IN NACL 20-0.9 MG/50ML-% IV SOLN
20.0000 mg | Freq: Once | INTRAVENOUS | Status: AC
  Administered 2022-03-30: 20 mg via INTRAVENOUS
  Filled 2022-03-30: qty 50

## 2022-03-30 MED ORDER — ZOLEDRONIC ACID 4 MG/100ML IV SOLN
4.0000 mg | Freq: Once | INTRAVENOUS | Status: AC
  Administered 2022-03-30: 4 mg via INTRAVENOUS
  Filled 2022-03-30: qty 100

## 2022-03-30 MED ORDER — HEPARIN SOD (PORK) LOCK FLUSH 100 UNIT/ML IV SOLN
INTRAVENOUS | Status: AC
  Filled 2022-03-30: qty 5

## 2022-03-30 NOTE — Progress Notes (Signed)
Per Beckey Rutter NP pt to receive Taxol and Zometa (okay with Calcium 8.8 per Beckey Rutter NP) at this time, No IVFs at this time.  ? ?

## 2022-03-30 NOTE — Progress Notes (Signed)
[place] ?OFFICE PROGRESS NOTE ? ?Patient Care Team: ?[name], MD as PCP - General (Internal Medicine) ?[name], MD (General Surgery) ?Marden Noble, MD (Internal Medicine) ?[name], MD as Consulting Physician (Internal Medicine) ? ?SUMMARY OF ONCOLOGIC HISTORY: ? ?Oncology History Overview Note  ?# 2007- LEFT BREAST CA STAGE II [T2N6m; s/p mastec; Dr.Byrnett] ER-Pos/PR Neg; Her- 2 Neu- NEG; ACx4-Taxol x12; Femara; stopped May 2009-stopped follow up. ? ?#JAN 2015- Post mastectomy Chest wall recurrence [chest wall mass bx-]; ER- 90%; PR- NEG; Her2 Neu- NEG; s/p excision [involving skeletal muscle/adipose tissue; clear margins]; CT July 2017- NED;  ? ?# Jan 2020- progression/ hilar- START abema [jan 14th]+ faslodex[jan 13th]; OCT-NOV 2022-PET scan shows progressive disease in the bones to liver.  Discontinue Abema+ Faslodex ? ?# DEC 1st, 2022- START Xeloda 2w/1w; MARCH 10th, 2023-progressive disease-lung bone liver ? ?# end of March 2023- taxol weekly ? ?# DEC 2021- anemia/? CKD [stool ];#Ascending colon mass-incidentally noted on PET scan.  S/p  Dr. AVicente Males  colonoscpy DEC 2022- NEGATIVE; reviewed small adenomas.  ? ?# OSTEOPENIA [BMD- June 2016] ? ?# Kidney cysts ? ?DIAGNOSIS: LEFT BREAST CA ? ?STAGE: IV- NED  ;GOALS: pallaitive ? ? ? ?  ?Breast cancer metastasized to skin ([facility]  ?02/05/2014 Initial Diagnosis  ? Breast cancer metastasized to skin ([facility] ?  ?Malignant ne[place] of overlapping sites of left breast in [place], estrogen receptor positive ([place]  ?07/24/2016 Initial Diagnosis  ? Cancer of overlapping sites of left female breast ([facility] ?  ?01/06/2019 - 08/05/2020 Chemotherapy  ? Patient is on Treatment Plan : BREAST Abemaciclib + Fulvestrant q28d  ?   ?03/22/2022 -  Chemotherapy  ? Patient is on Treatment Plan : BREAST Paclitaxel D1,8,15 q28d  ?   ? ?INTERVAL HISTORY: Accompanied by daughter; Ambulating independently. ? ?A very pleasant 81year old  African-American female patient with above history of ER/PR positive breast cancer stage IV, progressed on xeloda, now s/p cycle 1 of taxol is here for consideration of cycle 2 + zometa. Tolerated first cycle well with little to no side effects. No tingling. Pain is unchanged and denies pain today. Rare nausea. No vomiting. No diarrhea or constipation. Feels well.  ?Review of Systems  ?Constitutional:  Negative for chills, fever, malaise/fatigue and weight loss.  ?HENT:  Negative for hearing loss, nosebleeds, sore throat and tinnitus.   ?Eyes:  Negative for blurred vision and double vision.  ?Respiratory:  Negative for cough, hemoptysis, shortness of breath and wheezing.   ?Cardiovascular:  Negative for chest pain, palpitations and leg swelling.  ?Gastrointestinal:  Negative for abdominal pain, blood in stool, constipation, diarrhea, melena, nausea and vomiting.  ?Genitourinary:  Negative for dysuria and urgency.  ?Musculoskeletal:  Negative for back pain, falls, joint pain and myalgias.  ?Skin:  Negative for itching and rash.  ?Neurological:  Negative for dizziness, tingling, sensory change, loss of consciousness, weakness and headaches.  ?Endo/Heme/Allergies:  Negative for environmental allergies. Does not bruise/bleed easily.  ?Psychiatric/Behavioral:  Negative for depression. The patient is not nervous/anxious and does not have insomnia.   ? ?PAST MEDICAL HISTORY :  ?Past Medical History:  ?Diagnosis Date  ? [place]  ? Arthritis   ? Cancer ([facility] 2007  ? diagnosed 01/07 left breast with simple mastectomu & sn bx. The pt had a 3.5cm tumor. Frozen section report on the 2 sn were negative. On permanent sections a 0.569mmicrometastasis was identified. She was not felt to require a complete axillary  dissection. She has completed her Tamoxifen therapy and has been released from routine f/u with medical oncology service.  ? COPD (chronic obstructive pulmonary disease) ([place])   ? Diabetes mellitus without  complication ([place])   ? Hyperlipidemia   ? Hypertension   ? since age 48  ? Malignant neoplasm of upper-outer quadrant of female breast ([place]) 12/2013  ? Mastectomy site recurrence resected 01/26/2014, ER 90%, PR 0%, HER-2/neu nonamplified.  ? Other benign neoplasm of connective and other soft tissue of trunk, unspecified 2014  ? Personal history of colonic polyps   ? Personal history of tobacco use, presenting hazards to health   ? Special screening for malignant neoplasms, colon   ? Unspecified disorder of skin and subcutaneous tissue 02/19/2013  ? biopsy of skin over the sternum on the right breast showed hypertrophic scar,keloid formation.  ? ? ?PAST SURGICAL HISTORY :   ?Past Surgical History:  ?Procedure Laterality Date  ? ABDOMINAL HYSTERECTOMY  2004  ? total  ? BREAST BIOPSY Right January 26, 2014  ? Core biopsy for mild increase breast uptake on PET scan, usual ductal hyperplasia and stromal fibrosis  ? BREAST SURGERY Left 2007  ? mastectomy  ? chest wall mass  01/26/14  ? CHOLECYSTECTOMY  2007  ? COLONOSCOPY W/ POLYPECTOMY  2011  ? Dr. Brynda Greathouse  ? COLONOSCOPY WITH PROPOFOL N/A 12/22/2021  ? Procedure: COLONOSCOPY WITH PROPOFOL;  Surgeon: Jonathon Bellows, MD;  Location: [facility] ENDOSCOPY;  Service: Gastroenterology;  Laterality: N/A;  ? EYE SURGERY Right   ? cataract surgery  ? head surgery  1991  ? IR IMAGING GUIDED PORT INSERTION  03/13/2022  ? MASTECTOMY Left 2007  ? PORT-A-CATH REMOVAL  2008  ? PORTACATH PLACEMENT  2007  ? ? ?FAMILY HISTORY :   ?Family History  ?Problem Relation Age of Onset  ? Cancer Other   ?     ovarian,breast,colon cancers;relations not listed  ? Breast cancer Neg Hx   ? ? ?SOCIAL HISTORY:   ?Social History  ? ?Tobacco Use  ? Smoking status: Some Days  ?  Packs/day: 1.00  ?  Years: 50.00  ?  Pack years: 50.00  ?  Types: Cigarettes  ? Smokeless tobacco: Never  ?Vaping Use  ? Vaping Use: Some days  ?Substance Use Topics  ? Alcohol use: No  ?  Alcohol/week: 0.0 standard drinks  ? Drug use: No   ? ? ?ALLERGIES:  is allergic to metoprolol. ? ?MEDICATIONS:  ?Current Outpatient Medications  ?Medication Sig Dispense Refill  ? amLODipine (NORVASC) 10 MG tablet TAKE 1 TABLET(10 MG) BY MOUTH DAILY 90 tablet 1  ? atorvastatin (LIPITOR) 20 MG tablet Take 20 mg by mouth daily at 6 PM.    ? citalopram (CELEXA) 40 MG tablet TAKE 1 TABLET(40 MG) BY MOUTH DAILY 90 tablet 1  ? lidocaine-prilocaine (EMLA) cream Apply on the port. 30 -45 min  prior to port access. 30 g 3  ? lisinopril (ZESTRIL) 2.5 MG tablet Take 1 tablet (2.5 mg total) by mouth daily. 90 tablet 1  ? meloxicam (MOBIC) 15 MG tablet Take 1 tablet (15 mg total) by mouth daily. 60 tablet 1  ? ondansetron (ZOFRAN) 8 MG tablet One pill every 8 hours as needed for nausea/vomitting. 40 tablet 1  ? prochlorperazine (COMPAZINE) 10 MG tablet TAKE 1 TABLET(10 MG) BY MOUTH EVERY 6 HOURS AS NEEDED FOR NAUSEA OR VOMITING 40 tablet 1  ? capecitabine (XELODA) 500 MG tablet Take 3 tablets (1,500 mg total) by mouth  2 (two) times daily after a meal. Take for 14 days, then hold for 7 days. Repeat every 21 days. Start on 12/15/21. (Patient not taking: Reported on 03/22/2022) 84 tablet 1  ? ?No current facility-administered medications for this visit.  ? ?Facility-Administered Medications Ordered in Other Visits  ?Medication Dose Route Frequency Provider Last Rate Last Admin  ? heparin lock flush 100 unit/mL  500 Units Intravenous Once Verlon Au, NP      ? sodium chloride flush (NS) 0.9 % injection 10 mL  10 mL Intravenous PRN Verlon Au, NP   10 mL at 03/30/22 0912  ? ? ?PHYSICAL EXAMINATION: ?ECOG PERFORMANCE STATUS: 0 - Asymptomatic ? ?BP (!) 146/77 (BP Location: Right Arm, Patient Position: Sitting, Cuff Size: Normal)   Pulse 75   Temp 97.6 ?F (36.4 ?C) (Tympanic)   Resp 16   Ht 5' 6"  (1.676 m)   Wt 152 lb 9.6 oz (69.2 kg)   SpO2 98%   BMI 24.63 kg/m?  ? Danley Danker Weights  ? 03/30/22 0912  ?Weight: 152 lb 9.6 oz (69.2 kg)  ? ? ?Physical Exam ?Constitutional:    ?   Appearance: She is not ill-appearing.  ?Eyes:  ?   General: No scleral icterus. ?   Conjunctiva/sclera: Conjunctivae normal.  ?Cardiovascular:  ?   Rate and Rhythm: Normal rate and regular rhythm.  ?Abdominal:  ?   General:

## 2022-03-30 NOTE — Progress Notes (Signed)
Patient here for pre treatment check she has no complaints today or questions. ?

## 2022-03-30 NOTE — Patient Instructions (Addendum)
[facility] CANCER CTR AT [place]  Discharge Instructions: ?Thank you for choosing [facility] to provide your oncology and hematology care.  ?If you have a lab appointment with the [facility], please go directly to the [place] [place] and check in at the registration area. ? ?Wear comfortable clothing and clothing appropriate for easy access to any Portacath or PICC line.  ? ?We strive to give you quality time with your provider. You may need to reschedule your appointment if you arrive late (15 or more minutes).  Arriving late affects you and other patients whose appointments are after yours.  Also, if you miss three or more appointments without notifying the office, you may be dismissed from the clinic at the provider?s discretion.    ?  ?For prescription refill requests, have your pharmacy contact our office and allow 72 hours for refills to be completed.   ? ?Today you received the following chemotherapy and/or immunotherapy agents Taxol  ?    ?  ?To help prevent nausea and vomiting after your treatment, we encourage you to take your nausea medication as directed. ? ?BELOW ARE SYMPTOMS THAT SHOULD BE REPORTED IMMEDIATELY: ?*FEVER GREATER THAN 100.4 F (38 ?C) OR HIGHER ?*CHILLS OR SWEATING ?*NAUSEA AND VOMITING THAT IS NOT CONTROLLED WITH YOUR NAUSEA MEDICATION ?*UNUSUAL SHORTNESS OF BREATH ?*UNUSUAL BRUISING OR BLEEDING ?*URINARY PROBLEMS (pain or burning when urinating, or frequent urination) ?*BOWEL PROBLEMS (unusual diarrhea, constipation, pain near the anus) ?TENDERNESS IN MOUTH AND THROAT WITH OR WITHOUT PRESENCE OF ULCERS (sore throat, sores in mouth, or a toothache) ?UNUSUAL RASH, SWELLING OR PAIN  ?UNUSUAL VAGINAL DISCHARGE OR ITCHING  ? ?Items with * indicate a potential emergency and should be followed up as soon as possible or go to the Emergency Department if any problems should occur. ? ?Please show the CHEMOTHERAPY ALERT CARD or IMMUNOTHERAPY ALERT CARD at check-in to  the Emergency Department and triage nurse. ? ?Should you have questions after your visit or need to cancel or reschedule your appointment, please contact [facility] CANCER [place] AT [place]  [phone] and follow the prompts.  Office hours are 8:00 a.m. to 4:30 p.m. Monday - Friday. Please note that voicemails left after 4:00 p.m. may not be returned until the following business day.  We are closed weekends and major holidays. You have access to a nurse at all times for urgent questions. Please call the main number to the clinic [phone] and follow the prompts. ? ?For any non-urgent questions, you may also contact your provider using MyChart. We now offer e-Visits for anyone 47 and older to request care online for non-urgent symptoms. For details visit [email or web address]. ?  ?Also download the MyChart app! Go to the app store, search "MyChart", open the app, select [place], and log in with your MyChart username and password. ? ?Due to Covid, a mask is required upon entering the hospital/clinic. If you do not have a mask, one will be given to you upon arrival. For doctor visits, patients may have 1 support person aged 44 or older with them. For treatment visits, patients cannot have anyone with them due to current Covid guidelines and our immunocompromised population.  ? ? ? ?Zoledronic Acid Injection (Hypercalcemia, Oncology) ?What is this medication? ?ZOLEDRONIC ACID (ZOE le dron ik AS id) slows calcium loss from bones. It high calcium levels in the blood from some kinds of cancer. It may be used in other people at risk for bone loss. ?This medicine may be used  for other purposes; ask your health care provider or pharmacist if you have questions. ?COMMON BRAND NAME(S): Zometa ?What should I tell my care team before I take this medication? ?They need to know if you have any of these conditions: ?cancer ?dehydration ?dental disease ?kidney disease ?liver disease ?low levels of calcium in the  blood ?lung or breathing disease (asthma) ?receiving steroids like dexamethasone or prednisone ?an unusual or allergic reaction to zoledronic acid, other medicines, foods, dyes, or preservatives ?pregnant or trying to get pregnant ?breast-feeding ?How should I use this medication? ?This drug is injected into a vein. It is given by a health care provider in a hospital or clinic setting. ?Talk to your health care provider about the use of this drug in children. Special care may be needed. ?Overdosage: If you think you have taken too much of this medicine contact a poison control center or emergency room at once. ?NOTE: This medicine is only for you. Do not share this medicine with others. ?What if I miss a dose? ?Keep appointments for follow-up doses. It is important not to miss your dose. Call your health care provider if you are unable to keep an appointment. ?What may interact with this medication? ?certain antibiotics given by injection ?NSAIDs, medicines for pain and inflammation, like ibuprofen or naproxen ?some diuretics like bumetanide, furosemide ?teriparatide ?thalidomide ?This list may not describe all possible interactions. Give your health care provider a list of all the medicines, herbs, non-prescription drugs, or dietary supplements you use. Also tell them if you smoke, drink alcohol, or use illegal drugs. Some items may interact with your medicine. ?What should I watch for while using this medication? ?Visit your health care provider for regular checks on your progress. It may be some time before you see the benefit from this drug. ?Some people who take this drug have severe bone, joint, or muscle pain. This drug may also increase your risk for jaw problems or a broken thigh bone. Tell your health care provider right away if you have severe pain in your jaw, bones, joints, or muscles. Tell you health care provider if you have any pain that does not go away or that gets worse. ?Tell your dentist and  dental surgeon that you are taking this drug. You should not have major dental surgery while on this drug. See your dentist to have a dental exam and fix any dental problems before starting this drug. Take good care of your teeth while on this drug. Make sure you see your dentist for regular follow-up appointments. ?You should make sure you get enough calcium and vitamin D while you are taking this drug. Discuss the foods you eat and the vitamins you take with your health care provider. ?Check with your health care provider if you have severe diarrhea, nausea, and vomiting, or if you sweat a lot. The loss of too much body fluid may make it dangerous for you to take this drug. ?You may need blood work done while you are taking this drug. ?Do not become pregnant while taking this drug. Women should inform their health care provider if they wish to become pregnant or think they might be pregnant. There is potential for serious harm to an unborn child. Talk to your health care provider for more information. ?What side effects may I notice from receiving this medication? ?Side effects that you should report to your doctor or health care provider as soon as possible: ?allergic reactions (skin rash, itching or hives; swelling of  the face, lips, or tongue) ?bone pain ?infection (fever, chills, cough, sore throat, pain or trouble passing urine) ?jaw pain, especially after dental work ?joint pain ?kidney injury (trouble passing urine or change in the amount of urine) ?low blood pressure (dizziness; feeling faint or lightheaded, falls; unusually weak or tired) ?low calcium levels (fast heartbeat; muscle cramps or pain; pain, tingling, or numbness in the hands or feet; seizures) ?low magnesium levels (fast, irregular heartbeat; muscle cramp or pain; muscle weakness; tremors; seizures) ?low red blood cell counts (trouble breathing; feeling faint; lightheaded, falls; unusually weak or tired) ?muscle pain ?redness, blistering,  peeling, or loosening of the skin, including inside the mouth ?severe diarrhea ?swelling of the ankles, feet, hands ?trouble breathing ?Side effects that usually do not require medical attention (report to you

## 2022-03-31 LAB — CEA: CEA: 6.2 ng/mL — ABNORMAL HIGH (ref 0.0–4.7)

## 2022-03-31 LAB — CANCER ANTIGEN 27.29: CA 27.29: 127.3 U/mL — ABNORMAL HIGH (ref 0.0–38.6)

## 2022-04-05 MED FILL — Dexamethasone Sodium Phosphate Inj 100 MG/10ML: INTRAMUSCULAR | Qty: 1 | Status: AC

## 2022-04-06 ENCOUNTER — Encounter: Payer: Self-pay | Admitting: Internal Medicine

## 2022-04-06 ENCOUNTER — Inpatient Hospital Stay: Payer: Medicare HMO

## 2022-04-06 ENCOUNTER — Inpatient Hospital Stay (HOSPITAL_BASED_OUTPATIENT_CLINIC_OR_DEPARTMENT_OTHER): Payer: Medicare HMO | Admitting: Internal Medicine

## 2022-04-06 ENCOUNTER — Ambulatory Visit
Admission: RE | Admit: 2022-04-06 | Discharge: 2022-04-06 | Disposition: A | Discharge status: Home / Self Care | Payer: Medicare HMO | Source: Ambulatory Visit | Attending: Internal Medicine | Admitting: Internal Medicine

## 2022-04-06 ENCOUNTER — Ambulatory Visit: Payer: Medicare HMO

## 2022-04-06 VITALS — BP 137/67 | HR 83 | Temp 98.3°F | Ht 66.0 in | Wt 150.2 lb

## 2022-04-06 DIAGNOSIS — Z170 Estrogen receptor positive status [ER+]: Secondary | ICD-10-CM

## 2022-04-06 DIAGNOSIS — M899 Disorder of bone, unspecified: Secondary | ICD-10-CM | POA: Diagnosis not present

## 2022-04-06 DIAGNOSIS — C782 Secondary malignant neoplasm of pleura: Secondary | ICD-10-CM | POA: Diagnosis not present

## 2022-04-06 DIAGNOSIS — Z5111 Encounter for antineoplastic chemotherapy: Secondary | ICD-10-CM | POA: Diagnosis not present

## 2022-04-06 DIAGNOSIS — R0609 Other forms of dyspnea: Secondary | ICD-10-CM

## 2022-04-06 DIAGNOSIS — C50812 Malignant neoplasm of overlapping sites of left female breast: Secondary | ICD-10-CM

## 2022-04-06 DIAGNOSIS — Z95828 Presence of other vascular implants and grafts: Secondary | ICD-10-CM

## 2022-04-06 DIAGNOSIS — C787 Secondary malignant neoplasm of liver and intrahepatic bile duct: Secondary | ICD-10-CM | POA: Diagnosis not present

## 2022-04-06 DIAGNOSIS — R0600 Dyspnea, unspecified: Secondary | ICD-10-CM | POA: Diagnosis not present

## 2022-04-06 LAB — CBC WITH DIFFERENTIAL/PLATELET
Abs Immature Granulocytes: 0.02 10*3/uL (ref 0.00–0.07)
Basophils Absolute: 0 10*3/uL (ref 0.0–0.1)
Basophils Relative: 1 %
Eosinophils Absolute: 0 10*3/uL (ref 0.0–0.5)
Eosinophils Relative: 1 %
HCT: 32.6 % — ABNORMAL LOW (ref 36.0–46.0)
Hemoglobin: 10.5 g/dL — ABNORMAL LOW (ref 12.0–15.0)
Immature Granulocytes: 1 %
Lymphocytes Relative: 39 %
Lymphs Abs: 1.4 10*3/uL (ref 0.7–4.0)
MCH: 32.3 pg (ref 26.0–34.0)
MCHC: 32.2 g/dL (ref 30.0–36.0)
MCV: 100.3 fL — ABNORMAL HIGH (ref 80.0–100.0)
Monocytes Absolute: 0.3 10*3/uL (ref 0.1–1.0)
Monocytes Relative: 8 %
Neutro Abs: 1.9 10*3/uL (ref 1.7–7.7)
Neutrophils Relative %: 50 %
Platelets: 291 10*3/uL (ref 150–400)
RBC: 3.25 MIL/uL — ABNORMAL LOW (ref 3.87–5.11)
RDW: 15.5 % (ref 11.5–15.5)
WBC: 3.7 10*3/uL — ABNORMAL LOW (ref 4.0–10.5)
nRBC: 0 % (ref 0.0–0.2)

## 2022-04-06 LAB — COMPREHENSIVE METABOLIC PANEL
ALT: 14 U/L (ref 0–44)
AST: 28 U/L (ref 15–41)
Albumin: 3.7 g/dL (ref 3.5–5.0)
Alkaline Phosphatase: 74 U/L (ref 38–126)
Anion gap: 8 (ref 5–15)
BUN: 24 mg/dL — ABNORMAL HIGH (ref 8–23)
CO2: 23 mmol/L (ref 22–32)
Calcium: 8.5 mg/dL — ABNORMAL LOW (ref 8.9–10.3)
Chloride: 102 mmol/L (ref 98–111)
Creatinine, Ser: 0.94 mg/dL (ref 0.44–1.00)
GFR, Estimated: >60 mL/min (ref >60)
Glucose, Bld: 199 mg/dL — ABNORMAL HIGH (ref 70–99)
Potassium: 4 mmol/L (ref 3.5–5.1)
Sodium: 133 mmol/L — ABNORMAL LOW (ref 135–145)
Total Bilirubin: 0.6 mg/dL (ref 0.3–1.2)
Total Protein: 7.7 g/dL (ref 6.5–8.1)

## 2022-04-06 MED ORDER — HEPARIN SOD (PORK) LOCK FLUSH 100 UNIT/ML IV SOLN
500.0000 [IU] | Freq: Once | INTRAVENOUS | Status: AC
  Administered 2022-04-06: 500 [IU] via INTRAVENOUS
  Filled 2022-04-06: qty 5

## 2022-04-06 MED ORDER — PREDNISONE 20 MG PO TABS: 20.0000 mg | ORAL_TABLET | Freq: Every day | ORAL | 0 refills | Status: DC

## 2022-04-06 MED ORDER — AZITHROMYCIN 250 MG PO TABS: ORAL_TABLET | ORAL | 0 refills | Status: DC

## 2022-04-06 MED ORDER — SODIUM CHLORIDE 0.9% FLUSH
10.0000 mL | Freq: Once | INTRAVENOUS | Status: AC
  Administered 2022-04-06: 10 mL via INTRAVENOUS
  Filled 2022-04-06: qty 10

## 2022-04-06 NOTE — Patient Instructions (Signed)
Please get CXR today.  ?

## 2022-04-06 NOTE — Assessment & Plan Note (Addendum)
#  Left breast ipsilateral chest wall recurrence stage IV [2015]-bone/liver metastases/lung nodules ; currently on Xeloda [since December 2022]- CT scan March 10th, 2023-shows interval enlargement of subpleural nodules of the medial right lower lobe, consistent with worsened metastatic disease. Interval enlargement of a subtly hypodense lesion of the anterior left lobe of the liver, consistent with worsened hepatic metastatic Disease. Extensive new and increased osseous sclerosis involving the vertebral bodies, ribs, and bony pelvis, consistent with post treatment sclerosis of osseous metastatic disease.  Currently on Taxol weekly. ? ?# HOLD Taxol weekly cycle #1 da-15 today. Labs today reviewed; see discussion below ? ?# Dyspnea: recheck pulox after ambulating: O2 97 at start and dropped to 93 after 2 labs and HR did increase to 110.  After sitting a few minutes 02 went back up to 94 and HR decreased to 92.  Recommend stat chest x-ray; prednisone 20 mg a day Z-Pak. ? ?# PN-1- sec to Taxol- monitor for now.  ? ?# Bilateral hip pain- arthritis-[s/p ortho evaluation]; on meloxicam.  STABLE.  ? ?# HTN- continue amlodipine/Lisinopril-systolic blood pressure 671I.  Monitor closely. STABLE.  ? ?# Bone lesions: Zometa-patient would benefit from Zometa.  Will order. This will need to be scheduled in future/visits [will need dental clearance] ? ?# Mild anemia hemoglobin- 11-12/ CKD-STABLE;    ? ?# CKD- stage III- GFR 48-50- STABLE.  ? ?# Anxiety/depression- celexa-  STABLE.   ? ?# DISPOSITION:  ?# Hold chemo today; de-access ?# follow up in 2 weeks--; MD; cbc/cmp;CEA/ca27-29; Taxol - Dr.B ? ? ? ? ?

## 2022-04-06 NOTE — Progress Notes (Signed)
I connected with [name] on 04/06/22 at  9:00 AM EDT by video enabled telemedicine visit and verified that I am speaking with the correct person using two identifiers.  ?I discussed the limitations, risks, security and privacy concerns of performing an evaluation and management service by telemedicine and the availability of in-person appointments. I also discussed with the patient that there may be a patient responsible charge related to this service. The patient expressed understanding and agreed to proceed.  ? ? ?Other persons participating in the visit and their role in the encounter: RN/medical reconciliation ?Patient?s location: office ?Provider?s location: Home ? ?Oncology History Overview Note  ?# 2007- LEFT BREAST CA STAGE II [T2N4m; s/p mastec; Dr.Byrnett] ER-Pos/PR Neg; Her- 2 Neu- NEG; ACx4-Taxol x12; Femara; stopped May 2009-stopped follow up. ? ?#JAN 2015- Post mastectomy Chest wall recurrence [chest wall mass bx-]; ER- 90%; PR- NEG; Her2 Neu- NEG; s/p excision [involving skeletal muscle/adipose tissue; clear margins]; CT July 2017- NED;  ? ?# Jan 2020- progression/ hilar- START abema [jan 14th]+ faslodex[jan 13th]; OCT-NOV 2022-PET scan shows progressive disease in the bones to liver.  Discontinue Abema+ Faslodex ? ?# DEC 1st, 2022- START Xeloda 2w/1w; MARCH 10th, 2023-progressive disease-lung bone liver ? ?# end of March 2023- taxol weekly ? ?# DEC 2021- anemia/? CKD [stool ];#Ascending colon mass-incidentally noted on PET scan.  S/p  Dr. AVicente Males  colonoscpy DEC 2022- NEGATIVE; reviewed small adenomas.  ? ?# OSTEOPENIA [BMD- June 2016] ? ?# Kidney cysts ? ?DIAGNOSIS: LEFT BREAST CA ? ?STAGE: IV- NED  ;GOALS: pallaitive ? ? ? ?  ?Breast cancer metastasized to skin ([facility]  ?02/05/2014 Initial Diagnosis  ? Breast cancer metastasized to skin ([facility] ?  ?Malignant neoplasm of overlapping sites of left breast in female, estrogen receptor positive ([place]  ?07/24/2016 Initial Diagnosis  ? Cancer of  overlapping sites of left female breast ([facility] ?  ?01/06/2019 - 08/05/2020 Chemotherapy  ? Patient is on Treatment Plan : BREAST Abemaciclib + Fulvestrant q28d  ?   ?03/22/2022 -  Chemotherapy  ? Patient is on Treatment Plan : BREAST Paclitaxel D1,8,15 q28d  ?   ? ? ?Chief Complaint: Breast cancer ? ?History of present illness:Jessica MJANELIS [id number].[id number].  female with history of patient with above history of recurrent ER/PR positive breast cancer stage IV currently on Taxol weekly. ? ?Patient complains of worsening shortness of breath in the last few weeks.  No cough.  No chest pain.  Concerned about possible pollen.  Mild tingling and numbness in extremities. ?  ?Patient denies any nausea vomiting abdominal pain.  No fever no chills.  No rash on palms and soles.  Chronic joint pains.  Not worse.  No sores in the mouth.  No diarrhea ? ?Observation/objective: Alert & oriented x 3. In No acute distress.  ? ?Assessment and plan: ?Malignant neoplasm of overlapping sites of left breast in female, estrogen receptor positive ([place] ?#Left breast ipsilateral chest wall recurrence stage IV [2015]-bone/liver metastases/lung nodules ; currently on Xeloda [since December 2022]- CT scan March 10th, 2023-shows interval enlargement of subpleural nodules of the medial right lower lobe, consistent with worsened metastatic disease. Interval enlargement of a subtly hypodense lesion of the anterior left lobe of the liver, consistent with worsened hepatic metastatic Disease. Extensive new and increased osseous sclerosis involving the vertebral bodies, ribs, and bony pelvis, consistent with post treatment sclerosis of osseous metastatic disease.  Currently on Taxol weekly. ? ?# HOLD Taxol weekly cycle #1 da-15 today. Labs today reviewed;  see discussion below ? ?# Dyspnea: recheck pulox after ambulating: O2 97 at start and dropped to 93 after 2 labs and HR did increase to 110.  After sitting a few minutes 02 went back up to 94 and HR decreased to  92.  Recommend stat chest x-ray; prednisone 20 mg a day Z-Pak. ? ?# PN-1- sec to Taxol- monitor for now.  ? ?# Bilateral hip pain- arthritis-[s/p ortho evaluation]; on meloxicam.  STABLE.  ? ?# HTN- continue amlodipine/Lisinopril-systolic blood pressure 703J.  Monitor closely. STABLE.  ? ?# Bone lesions: Zometa-patient would benefit from Zometa.  Will order. This will need to be scheduled in future/visits [will need dental clearance] ? ?# Mild anemia hemoglobin- 11-12/ CKD-STABLE;    ? ?# CKD- stage III- GFR 48-50- STABLE.  ? ?# Anxiety/depression- celexa-  STABLE.   ? ?# DISPOSITION:  ?# Hold chemo today; de-access ?# follow up in 2 weeks--; MD; cbc/cmp;CEA/ca27-29; Taxol - Dr.B ? ? ? ? ?Follow-up instructions: ? ?I discussed the assessment and treatment plan with the patient.  The patient was provided an opportunity to ask questions and all were answered.  The patient agreed with the plan and demonstrated understanding of instructions. ? ?The patient was advised to call back or seek an in person evaluation if the symptoms worsen or if the condition fails to improve as anticipated. ? ?Dr. Charlaine Dalton ?[facility] ?04/06/2022 ?1:40 PM ?

## 2022-04-06 NOTE — Progress Notes (Signed)
C/o SOB getting worse. ? ? ?

## 2022-04-07 ENCOUNTER — Telehealth: Payer: Self-pay | Admitting: Internal Medicine

## 2022-04-07 LAB — CEA: CEA: 6.8 ng/mL — ABNORMAL HIGH (ref 0.0–4.7)

## 2022-04-07 LAB — CANCER ANTIGEN 27.29: CA 27.29: 135.3 U/mL — ABNORMAL HIGH (ref 0.0–38.6)

## 2022-04-10 ENCOUNTER — Other Ambulatory Visit: Payer: Self-pay | Admitting: Internal Medicine

## 2022-04-20 ENCOUNTER — Inpatient Hospital Stay: Payer: Medicare HMO

## 2022-04-20 ENCOUNTER — Inpatient Hospital Stay (HOSPITAL_BASED_OUTPATIENT_CLINIC_OR_DEPARTMENT_OTHER): Payer: Medicare HMO | Admitting: Internal Medicine

## 2022-04-20 ENCOUNTER — Encounter: Payer: Self-pay | Admitting: Internal Medicine

## 2022-04-20 DIAGNOSIS — C50812 Malignant neoplasm of overlapping sites of left female breast: Secondary | ICD-10-CM | POA: Diagnosis not present

## 2022-04-20 DIAGNOSIS — Z170 Estrogen receptor positive status [ER+]: Secondary | ICD-10-CM | POA: Diagnosis not present

## 2022-04-20 DIAGNOSIS — C782 Secondary malignant neoplasm of pleura: Secondary | ICD-10-CM | POA: Diagnosis not present

## 2022-04-20 DIAGNOSIS — Z5111 Encounter for antineoplastic chemotherapy: Secondary | ICD-10-CM | POA: Diagnosis not present

## 2022-04-20 DIAGNOSIS — C787 Secondary malignant neoplasm of liver and intrahepatic bile duct: Secondary | ICD-10-CM | POA: Diagnosis not present

## 2022-04-20 DIAGNOSIS — M899 Disorder of bone, unspecified: Secondary | ICD-10-CM | POA: Diagnosis not present

## 2022-04-20 LAB — CBC WITH DIFFERENTIAL/PLATELET
Abs Immature Granulocytes: 0.07 10*3/uL (ref 0.00–0.07)
Basophils Absolute: 0.1 10*3/uL (ref 0.0–0.1)
Basophils Relative: 0 %
Eosinophils Absolute: 0.1 10*3/uL (ref 0.0–0.5)
Eosinophils Relative: 1 %
HCT: 34.6 % — ABNORMAL LOW (ref 36.0–46.0)
Hemoglobin: 11.2 g/dL — ABNORMAL LOW (ref 12.0–15.0)
Immature Granulocytes: 1 %
Lymphocytes Relative: 24 %
Lymphs Abs: 3.1 10*3/uL (ref 0.7–4.0)
MCH: 32.1 pg (ref 26.0–34.0)
MCHC: 32.4 g/dL (ref 30.0–36.0)
MCV: 99.1 fL (ref 80.0–100.0)
Monocytes Absolute: 1.4 10*3/uL — ABNORMAL HIGH (ref 0.1–1.0)
Monocytes Relative: 11 %
Neutro Abs: 8.5 10*3/uL — ABNORMAL HIGH (ref 1.7–7.7)
Neutrophils Relative %: 63 %
Platelets: 260 10*3/uL (ref 150–400)
RBC: 3.49 MIL/uL — ABNORMAL LOW (ref 3.87–5.11)
RDW: 15 % (ref 11.5–15.5)
WBC: 13.3 10*3/uL — ABNORMAL HIGH (ref 4.0–10.5)
nRBC: 0 % (ref 0.0–0.2)

## 2022-04-20 LAB — COMPREHENSIVE METABOLIC PANEL
ALT: 16 U/L (ref 0–44)
AST: 25 U/L (ref 15–41)
Albumin: 3.7 g/dL (ref 3.5–5.0)
Alkaline Phosphatase: 96 U/L (ref 38–126)
Anion gap: 11 (ref 5–15)
BUN: 18 mg/dL (ref 8–23)
CO2: 25 mmol/L (ref 22–32)
Calcium: 9.2 mg/dL (ref 8.9–10.3)
Chloride: 99 mmol/L (ref 98–111)
Creatinine, Ser: 0.95 mg/dL (ref 0.44–1.00)
GFR, Estimated: >60 mL/min (ref >60)
Glucose, Bld: 159 mg/dL — ABNORMAL HIGH (ref 70–99)
Potassium: 4 mmol/L (ref 3.5–5.1)
Sodium: 135 mmol/L (ref 135–145)
Total Bilirubin: 0.7 mg/dL (ref 0.3–1.2)
Total Protein: 7.8 g/dL (ref 6.5–8.1)

## 2022-04-20 MED ORDER — HEPARIN SOD (PORK) LOCK FLUSH 100 UNIT/ML IV SOLN
500.0000 [IU] | Freq: Once | INTRAVENOUS | Status: AC | PRN
  Filled 2022-04-20: qty 5

## 2022-04-20 MED ORDER — METHOCARBAMOL 500 MG PO TABS: 500.0000 mg | ORAL_TABLET | Freq: Two times a day (BID) | ORAL | 1 refills | Status: AC

## 2022-04-20 MED ORDER — HEPARIN SOD (PORK) LOCK FLUSH 100 UNIT/ML IV SOLN
INTRAVENOUS | Status: AC
  Administered 2022-04-20: 500 [IU]
  Filled 2022-04-20: qty 5

## 2022-04-20 MED ORDER — MELOXICAM 15 MG PO TABS: ORAL_TABLET | ORAL | 1 refills | Status: AC

## 2022-04-20 MED ORDER — SODIUM CHLORIDE 0.9 % IV SOLN
Freq: Once | INTRAVENOUS | Status: AC
  Filled 2022-04-20: qty 250

## 2022-04-20 MED ORDER — DIPHENHYDRAMINE HCL 50 MG/ML IJ SOLN
50.0000 mg | Freq: Once | INTRAMUSCULAR | Status: AC
  Administered 2022-04-20: 50 mg via INTRAVENOUS
  Filled 2022-04-20: qty 1

## 2022-04-20 MED ORDER — FAMOTIDINE IN NACL 20-0.9 MG/50ML-% IV SOLN
20.0000 mg | Freq: Once | INTRAVENOUS | Status: AC
  Administered 2022-04-20: 20 mg via INTRAVENOUS
  Filled 2022-04-20: qty 50

## 2022-04-20 MED ORDER — SODIUM CHLORIDE 0.9 % IV SOLN
10.0000 mg | Freq: Once | INTRAVENOUS | Status: AC
  Administered 2022-04-20: 10 mg via INTRAVENOUS
  Filled 2022-04-20: qty 10

## 2022-04-20 MED ORDER — SODIUM CHLORIDE 0.9 % IV SOLN
80.0000 mg/m2 | Freq: Once | INTRAVENOUS | Status: AC
  Administered 2022-04-20: 144 mg via INTRAVENOUS
  Filled 2022-04-20: qty 24

## 2022-04-20 MED FILL — Dexamethasone Sodium Phosphate Inj 100 MG/10ML: INTRAMUSCULAR | Qty: 1 | Status: AC

## 2022-04-20 NOTE — Progress Notes (Signed)
Needs refill of meloxicam and methocarbamol. ?

## 2022-04-20 NOTE — Progress Notes (Signed)
[place] ?OFFICE PROGRESS NOTE ? ?Patient Care Team: ?[name], MD as PCP - General (Internal Medicine) ?[name], MD (General Surgery) ?Marden Noble, MD (Internal Medicine) ?[name], MD as Consulting Physician (Internal Medicine) ? ? ?SUMMARY OF ONCOLOGIC HISTORY: ? ?Oncology History Overview Note  ?# 2007- LEFT BREAST CA STAGE II [T2N21m; s/p mastec; Dr.Byrnett] ER-Pos/PR Neg; Her- 2 Neu- NEG; ACx4-Taxol x12; Femara; stopped May 2009-stopped follow up. ? ?#JAN 2015- Post mastectomy Chest wall recurrence [chest wall mass bx-]; ER- 90%; PR- NEG; Her2 Neu- NEG; s/p excision [involving skeletal muscle/adipose tissue; clear margins]; CT July 2017- NED;  ? ?# Jan 2020- progression/ hilar- START abema [jan 14th]+ faslodex[jan 13th]; OCT-NOV 2022-PET scan shows progressive disease in the bones to liver.  Discontinue Abema+ Faslodex ? ?# DEC 1st, 2022- START Xeloda 2w/1w; MARCH 10th, 2023-progressive disease-lung bone liver ? ?# end of March 2023- taxol weekly ? ?# DEC 2021- anemia/? CKD [stool ];#Ascending colon mass-incidentally noted on PET scan.  S/p  Dr. AVicente Males  colonoscpy DEC 2022- NEGATIVE; reviewed small adenomas.  ? ?# OSTEOPENIA [BMD- June 2016] ? ?# Kidney cysts ? ?DIAGNOSIS: LEFT BREAST CA ? ?STAGE: IV- NED  ;GOALS: pallaitive ? ? ? ?  ?Breast cancer metastasized to skin ([facility]  ?02/05/2014 Initial Diagnosis  ? Breast cancer metastasized to skin ([facility] ? ?  ?Malignant neoplasm of overlapping sites of left breast in female, estrogen receptor positive ([facility]  ?07/24/2016 Initial Diagnosis  ? Cancer of overlapping sites of left female breast ([facility] ? ?  ?01/06/2019 - 08/05/2020 Chemotherapy  ? Patient is on Treatment Plan : BREAST Abemaciclib + Fulvestrant q28d  ? ?  ?  ?03/22/2022 -  Chemotherapy  ? Patient is on Treatment Plan : BREAST Paclitaxel D1,8,15 q28d  ? ?  ?  ? ?INTERVAL HISTORY: Accompanied by  daughter; Ambulating independently. ? ?A very pleasant  81year old African-American female patient with above history of ER/PR positive breast cancer stage IV currently on Taxol single agent is here for follow-up. ? ?Patient's treatment was held 2 weeks ago because of bronchitis.  S/p prednisone/Z-Pak symptoms resolved. ? ?No nausea no vomiting.  Mild fatigue.  Mild shortness of breath on exertion. No fever no chills.  No rash on palms and soles.  Chronic joint pains.  Not worse.  No sores in the mouth.  No diarrhea. ? ?Review of Systems  ?Constitutional:  Positive for malaise/fatigue. Negative for chills, diaphoresis, fever and weight loss.  ?HENT:  Negative for nosebleeds and sore throat.   ?Eyes:  Negative for double vision.  ?Respiratory:  Negative for cough, hemoptysis, sputum production, shortness of breath and wheezing.   ?Cardiovascular:  Negative for chest pain, palpitations, orthopnea and leg swelling.  ?Gastrointestinal:  Negative for abdominal pain, blood in stool, constipation, diarrhea, heartburn, melena, nausea and vomiting.  ?Musculoskeletal:  Positive for back pain and joint pain.  ?Skin: Negative.  Negative for itching and rash.  ?Neurological:  Negative for tingling, focal weakness, weakness and headaches.  ?Endo/Heme/Allergies:  Does not bruise/bleed easily.  ?Psychiatric/Behavioral:  Negative for depression. The patient is not nervous/anxious and does not have insomnia.   ?PAST MEDICAL HISTORY :  ?Past Medical History:  ?Diagnosis Date  ? Aneurysm ([facility] 2007  ? Arthritis   ? Cancer ([facility] 2007  ? diagnosed 01/07 left breast with simple mastectomu & sn bx. The pt had a 3.5cm tumor. Frozen section report on the 2 sn were negative. On permanent sections a 0.51mmicrometastasis  was identified. She was not felt to require a complete axillary dissection. She has completed her Tamoxifen therapy and has been released from routine f/u with medical oncology service.  ? COPD (chronic obstructive pulmonary disease) ([place])   ? Diabetes mellitus without  complication ([place])   ? Hyperlipidemia   ? Hypertension   ? since age 8  ? Malignant neoplasm of upper-outer quadrant of female breast ([place]) 12/2013  ? Mastectomy site recurrence resected 01/26/2014, ER 90%, PR 0%, HER-2/neu nonamplified.  ? Other benign neoplasm of connective and other soft tissue of trunk, unspecified 2014  ? Personal history of colonic polyps   ? Personal history of tobacco use, presenting hazards to health   ? Special screening for malignant neoplasms, colon   ? Unspecified disorder of skin and subcutaneous tissue 02/19/2013  ? biopsy of skin over the sternum on the right breast showed hypertrophic scar,keloid formation.  ? ? ?PAST SURGICAL HISTORY :   ?Past Surgical History:  ?Procedure Laterality Date  ? ABDOMINAL HYSTERECTOMY  2004  ? total  ? BREAST BIOPSY Right January 26, 2014  ? Core biopsy for mild increase breast uptake on PET scan, usual ductal hyperplasia and stromal fibrosis  ? BREAST SURGERY Left 2007  ? mastectomy  ? chest wall mass  01/26/14  ? CHOLECYSTECTOMY  2007  ? COLONOSCOPY W/ POLYPECTOMY  2011  ? Dr. Brynda Greathouse  ? COLONOSCOPY WITH PROPOFOL N/A 12/22/2021  ? Procedure: COLONOSCOPY WITH PROPOFOL;  Surgeon: Jonathon Bellows, MD;  Location[facility] END[facility]COPY;  Service: Gastroenterology;  Laterality: N/A;  ? EYE SURGERY Right   ? cataract surgery  ? head surgery  1991  ? IR IMAGING GUIDED PORT INSERTION  03/13/2022  ? MASTECTOMY Left 2007  ? PORT-A-CATH REMOVAL  2008  ? PORTACATH PLACEMENT  2007  ? ? ?FAMILY HISTORY :   ?Family History  ?Problem Relation Age of Onset  ? Cancer Other   ?     ovarian,breast,colon cancers;relations not listed  ? Breast cancer Neg Hx   ? ? ?SOCIAL HISTORY:   ?Social History  ? ?Tobacco Use  ? Smoking status: Some Days  ?  Packs/day: 1.00  ?  Years: 50.00  ?  Pack years: 50.00  ?  Types: Cigarettes  ? Smokeless tobacco: Never  ?Vaping Use  ? Vaping Use: Some days  ?Substance Use Topics  ? Alcohol use: No  ?  Alcohol/week: 0.0 standard drinks  ? Drug use: No   ? ? ?ALLERGIES:  is allergic to metoprolol. ? ?MEDICATIONS:  ?Current Outpatient Medications  ?Medication Sig Dispense Refill  ? amLODipine (NORVASC) 10 MG tablet TAKE 1 TABLET(10 MG) BY MOUTH DAILY 90 tablet 1  ? atorvastatin (LIPITOR) 20 MG tablet Take 20 mg by mouth daily at 6 PM.    ? azithromycin (ZITHROMAX) 250 MG tablet Take 2 on day 1; and then 1 pill once a day. 6 each 0  ? citalopram (CELEXA) 40 MG tablet TAKE 1 TABLET(40 MG) BY MOUTH DAILY 90 tablet 1  ? lidocaine-prilocaine (EMLA) cream Apply on the port. 30 -45 min  prior to port access. 30 g 3  ? lisinopril (ZESTRIL) 2.5 MG tablet Take 1 tablet (2.5 mg total) by mouth daily. 90 tablet 1  ? ondansetron (ZOFRAN) 8 MG tablet One pill every 8 hours as needed for nausea/vomitting. 40 tablet 1  ? predniSONE (DELTASONE) 20 MG tablet Take 1 tablet (20 mg total) by mouth daily with breakfast. Once a day with food. 14 tablet 0  ?  prochlorperazine (COMPAZINE) 10 MG tablet TAKE 1 TABLET(10 MG) BY MOUTH EVERY 6 HOURS AS NEEDED FOR NAUSEA OR VOMITING 40 tablet 1  ? meloxicam (MOBIC) 15 MG tablet TAKE 1 TABLET(15 MG) BY MOUTH DAILY 90 tablet 1  ? methocarbamol (ROBAXIN) 500 MG tablet Take 1 tablet (500 mg total) by mouth in the morning and at bedtime. 60 tablet 1  ? ?No current facility-administered medications for this visit.  ? ? ?PHYSICAL EXAMINATION: ?ECOG PERFORMANCE STATUS: 0 - Asymptomatic ? ?BP 135/75 (BP Location: Right Arm, Patient Position: Sitting, Cuff Size: Normal)   Pulse 85   Temp 99 ?F (37.2 ?C) (Tympanic)   Ht 5' 6"  (1.676 m)   Wt 150 lb 3.2 oz (68.1 kg)   SpO2 100%   BMI 24.24 kg/m?  ? Danley Danker Weights  ? 04/20/22 0857  ?Weight: 150 lb 3.2 oz (68.1 kg)  ? ? ? ?Physical Exam ?HENT:  ?   Head: Normocephalic and atraumatic.  ?   Mouth/Throat:  ?   Pharynx: No oropharyngeal exudate.  ?Eyes:  ?   Pupils: Pupils are equal, round, and reactive to light.  ?Cardiovascular:  ?   Rate and Rhythm: Normal rate and regular rhythm.  ?Pulmonary:  ?   Effort:  No respiratory distress.  ?   Breath sounds: No wheezing.  ?Abdominal:  ?   General: Bowel sounds are normal. There is no distension.  ?   Palpations: Abdomen is soft. There is no mass.  ?   Tenderness: There is no abdominal tender

## 2022-04-20 NOTE — Patient Instructions (Signed)
Jessica Compton AT [place]-MEDICAL ONCOLOGY  Discharge Instructions: Thank you for choosing [facility] to provide your oncology and hematology care.  If you have a lab appointment with the Cancer Center, please go directly to the Cancer Center and check in at the registration area.  Wear comfortable clothing and clothing appropriate for easy access to any Portacath or PICC line.   We strive to give you quality time with your provider. You may need to reschedule your appointment if you arrive late (15 or more minutes).  Arriving late affects you and other patients whose appointments are after yours.  Also, if you miss three or more appointments without notifying the office, you may be dismissed from the clinic at the provider's discretion.      For prescription refill requests, have your pharmacy contact our office and allow 72 hours for refills to be completed.    Today you received the following chemotherapy and/or immunotherapy agents Taxol      To help prevent nausea and vomiting after your treatment, we encourage you to take your nausea medication as directed.  BELOW ARE SYMPTOMS THAT SHOULD BE REPORTED IMMEDIATELY: *FEVER GREATER THAN 100.4 F (38 C) OR HIGHER *CHILLS OR SWEATING *NAUSEA AND VOMITING THAT IS NOT CONTROLLED WITH YOUR NAUSEA MEDICATION *UNUSUAL SHORTNESS OF BREATH *UNUSUAL BRUISING OR BLEEDING *URINARY PROBLEMS (pain or burning when urinating, or frequent urination) *BOWEL PROBLEMS (unusual diarrhea, constipation, pain near the anus) TENDERNESS IN MOUTH AND THROAT WITH OR WITHOUT PRESENCE OF ULCERS (sore throat, sores in mouth, or a toothache) UNUSUAL RASH, SWELLING OR PAIN  UNUSUAL VAGINAL DISCHARGE OR ITCHING   Items with * indicate a potential emergency and should be followed up as soon as possible or go to the Emergency Department if any problems should occur.  Please show the CHEMOTHERAPY ALERT CARD or IMMUNOTHERAPY ALERT CARD at check-in to the  Emergency Department and triage nurse.  Should you have questions after your visit or need to cancel or reschedule your appointment, please contact Jessica Compton AT [place]-MEDICAL ONCOLOGY  [phone] and follow the prompts.  Office hours are 8:00 a.m. to 4:30 p.m. Monday - Friday. Please note that voicemails left after 4:00 p.m. may not be returned until the following business day.  We are closed weekends and major holidays. You have access to a nurse at all times for urgent questions. Please call the main number to the clinic [phone] and follow the prompts.  For any non-urgent questions, you may also contact your provider using MyChart. We now offer e-Visits for anyone 18 and older to request care online for non-urgent symptoms. For details visit [email or web address].   Also download the MyChart app! Go to the app store, search "MyChart", open the app, select [place], and log in with your MyChart username and password.  Due to Covid, a mask is required upon entering the hospital/clinic. If you do not have a mask, one will be given to you upon arrival. For doctor visits, patients may have 1 support person aged 18 or older with them. For treatment visits, patients cannot have anyone with them due to current Covid guidelines and our immunocompromised population.  

## 2022-04-20 NOTE — Assessment & Plan Note (Signed)
#  Left breast ipsilateral chest wall recurrence stage IV [2015]-bone/liver metastases/lung nodules ;  CT scan March 10th, 2023-shows interval enlargement of subpleural nodules of the medial right lower lobe, consistent with worsened metastatic disease. Interval enlargement of a subtly hypodense lesion of the anterior left lobe of the liver, consistent with worsened hepatic metastatic Disease. Extensive new and increased osseous sclerosis involving the vertebral bodies, ribs, and bony pelvis, consistent with post treatment sclerosis of osseous metastatic disease.  Currently on Taxol weekly. ? ?# proceed with  Taxol weekly cycle #2 da-1 today. Labs today reviewed; see discussion below.  Discussed that tumor markers are rising-however patient had only 2 weekly treatments of Taxol at this time.  Discussed that we will plan reimaging in 2 to 3 months. ? ?# acute bronchitis- s/p prednisone 20 mg a day Z-Pak; improved.. ? ?# PN-1- sec to Taxol- monitor for now.  ? ?# Bilateral hip pain- arthritis-[s/p ortho evaluation]; on meloxicam/robaxin- refilled.  STABLE.  ? ?# HTN- continue amlodipine/Lisinopril-systolic blood pressure 462M.  Monitor closely. STABLE.  ? ?# Bone lesions: discussed zometa. Recommend ca [500]+vit D [1000 IU]. Discussed re: risk of hypocalcemia/ONJ. Pt is edentulous/does not use dentures.  ? ?# Mild anemia hemoglobin- 11-12/ CKD-STABLE;    ? ?# CKD- stage III- GFR 48-50- STABLE;   ? ?# Anxiety/depression- celexa-  STABLE.   ? ?# DISPOSITION:  ?# chemo today;  ?# 1 week- labs- cbc/cmp;taxol; Zometa ?# follow up in 2 weeks--; MD; cbc/cmp;CEA/ca27-29; Taxol - Dr.B ? ? ? ? ?

## 2022-04-21 LAB — CEA: CEA: 8.5 ng/mL — ABNORMAL HIGH (ref 0.0–4.7)

## 2022-04-21 LAB — CANCER ANTIGEN 27.29: CA 27.29: 153.2 U/mL — ABNORMAL HIGH (ref 0.0–38.6)

## 2022-04-27 ENCOUNTER — Inpatient Hospital Stay: Payer: Medicare HMO

## 2022-04-27 ENCOUNTER — Inpatient Hospital Stay: Payer: Medicare HMO | Attending: Internal Medicine

## 2022-04-27 VITALS — BP 168/80 | HR 78 | Temp 98.0°F | Resp 16 | Wt 150.0 lb

## 2022-04-27 DIAGNOSIS — Z5111 Encounter for antineoplastic chemotherapy: Secondary | ICD-10-CM | POA: Insufficient documentation

## 2022-04-27 DIAGNOSIS — Z170 Estrogen receptor positive status [ER+]: Secondary | ICD-10-CM | POA: Diagnosis not present

## 2022-04-27 DIAGNOSIS — C50812 Malignant neoplasm of overlapping sites of left female breast: Secondary | ICD-10-CM | POA: Diagnosis not present

## 2022-04-27 DIAGNOSIS — C787 Secondary malignant neoplasm of liver and intrahepatic bile duct: Secondary | ICD-10-CM | POA: Insufficient documentation

## 2022-04-27 DIAGNOSIS — C7951 Secondary malignant neoplasm of bone: Secondary | ICD-10-CM | POA: Insufficient documentation

## 2022-04-27 DIAGNOSIS — C7801 Secondary malignant neoplasm of right lung: Secondary | ICD-10-CM | POA: Diagnosis not present

## 2022-04-27 LAB — COMPREHENSIVE METABOLIC PANEL
ALT: 15 U/L (ref 0–44)
AST: 26 U/L (ref 15–41)
Albumin: 3.5 g/dL (ref 3.5–5.0)
Alkaline Phosphatase: 77 U/L (ref 38–126)
Anion gap: 4 — ABNORMAL LOW (ref 5–15)
BUN: 14 mg/dL (ref 8–23)
CO2: 26 mmol/L (ref 22–32)
Calcium: 8.4 mg/dL — ABNORMAL LOW (ref 8.9–10.3)
Chloride: 106 mmol/L (ref 98–111)
Creatinine, Ser: 0.78 mg/dL (ref 0.44–1.00)
GFR, Estimated: >60 mL/min (ref >60)
Glucose, Bld: 169 mg/dL — ABNORMAL HIGH (ref 70–99)
Potassium: 3.6 mmol/L (ref 3.5–5.1)
Sodium: 136 mmol/L (ref 135–145)
Total Bilirubin: 0.5 mg/dL (ref 0.3–1.2)
Total Protein: 7.2 g/dL (ref 6.5–8.1)

## 2022-04-27 LAB — CBC WITH DIFFERENTIAL/PLATELET
Abs Immature Granulocytes: 0.02 10*3/uL (ref 0.00–0.07)
Basophils Absolute: 0 10*3/uL (ref 0.0–0.1)
Basophils Relative: 1 %
Eosinophils Absolute: 0.2 10*3/uL (ref 0.0–0.5)
Eosinophils Relative: 4 %
HCT: 31.3 % — ABNORMAL LOW (ref 36.0–46.0)
Hemoglobin: 10 g/dL — ABNORMAL LOW (ref 12.0–15.0)
Immature Granulocytes: 0 %
Lymphocytes Relative: 41 %
Lymphs Abs: 2.2 10*3/uL (ref 0.7–4.0)
MCH: 31.7 pg (ref 26.0–34.0)
MCHC: 31.9 g/dL (ref 30.0–36.0)
MCV: 99.4 fL (ref 80.0–100.0)
Monocytes Absolute: 0.3 10*3/uL (ref 0.1–1.0)
Monocytes Relative: 6 %
Neutro Abs: 2.6 10*3/uL (ref 1.7–7.7)
Neutrophils Relative %: 48 %
Platelets: 248 10*3/uL (ref 150–400)
RBC: 3.15 MIL/uL — ABNORMAL LOW (ref 3.87–5.11)
RDW: 14.8 % (ref 11.5–15.5)
WBC: 5.4 10*3/uL (ref 4.0–10.5)
nRBC: 0 % (ref 0.0–0.2)

## 2022-04-27 MED ORDER — SODIUM CHLORIDE 0.9 % IV SOLN
10.0000 mg | Freq: Once | INTRAVENOUS | Status: AC
  Administered 2022-04-27: 10 mg via INTRAVENOUS
  Filled 2022-04-27: qty 10

## 2022-04-27 MED ORDER — DIPHENHYDRAMINE HCL 50 MG/ML IJ SOLN
50.0000 mg | Freq: Once | INTRAMUSCULAR | Status: AC
  Administered 2022-04-27: 50 mg via INTRAVENOUS
  Filled 2022-04-27: qty 1

## 2022-04-27 MED ORDER — FAMOTIDINE IN NACL 20-0.9 MG/50ML-% IV SOLN
20.0000 mg | Freq: Once | INTRAVENOUS | Status: AC
  Administered 2022-04-27: 20 mg via INTRAVENOUS
  Filled 2022-04-27: qty 50

## 2022-04-27 MED ORDER — SODIUM CHLORIDE 0.9 % IV SOLN
80.0000 mg/m2 | Freq: Once | INTRAVENOUS | Status: AC
  Administered 2022-04-27: 144 mg via INTRAVENOUS
  Filled 2022-04-27: qty 24

## 2022-04-27 MED ORDER — HEPARIN SOD (PORK) LOCK FLUSH 100 UNIT/ML IV SOLN
500.0000 [IU] | Freq: Once | INTRAVENOUS | Status: AC | PRN
  Administered 2022-04-27: 500 [IU]
  Filled 2022-04-27: qty 5

## 2022-04-27 MED ORDER — SODIUM CHLORIDE 0.9 % IV SOLN
Freq: Once | INTRAVENOUS | Status: AC
  Filled 2022-04-27: qty 250

## 2022-05-04 ENCOUNTER — Encounter: Payer: Self-pay | Admitting: Internal Medicine

## 2022-05-04 ENCOUNTER — Inpatient Hospital Stay: Payer: Medicare HMO

## 2022-05-04 ENCOUNTER — Inpatient Hospital Stay (HOSPITAL_BASED_OUTPATIENT_CLINIC_OR_DEPARTMENT_OTHER): Payer: Medicare HMO | Admitting: Internal Medicine

## 2022-05-04 DIAGNOSIS — C7951 Secondary malignant neoplasm of bone: Secondary | ICD-10-CM | POA: Diagnosis not present

## 2022-05-04 DIAGNOSIS — Z170 Estrogen receptor positive status [ER+]: Secondary | ICD-10-CM

## 2022-05-04 DIAGNOSIS — C787 Secondary malignant neoplasm of liver and intrahepatic bile duct: Secondary | ICD-10-CM | POA: Diagnosis not present

## 2022-05-04 DIAGNOSIS — C50812 Malignant neoplasm of overlapping sites of left female breast: Secondary | ICD-10-CM

## 2022-05-04 DIAGNOSIS — Z5111 Encounter for antineoplastic chemotherapy: Secondary | ICD-10-CM | POA: Diagnosis not present

## 2022-05-04 DIAGNOSIS — C7801 Secondary malignant neoplasm of right lung: Secondary | ICD-10-CM | POA: Diagnosis not present

## 2022-05-04 LAB — COMPREHENSIVE METABOLIC PANEL
ALT: 18 U/L (ref 0–44)
AST: 28 U/L (ref 15–41)
Albumin: 3.7 g/dL (ref 3.5–5.0)
Alkaline Phosphatase: 63 U/L (ref 38–126)
Anion gap: 5 (ref 5–15)
BUN: 14 mg/dL (ref 8–23)
CO2: 24 mmol/L (ref 22–32)
Calcium: 8.4 mg/dL — ABNORMAL LOW (ref 8.9–10.3)
Chloride: 106 mmol/L (ref 98–111)
Creatinine, Ser: 0.83 mg/dL (ref 0.44–1.00)
GFR, Estimated: >60 mL/min (ref >60)
Glucose, Bld: 154 mg/dL — ABNORMAL HIGH (ref 70–99)
Potassium: 3.6 mmol/L (ref 3.5–5.1)
Sodium: 135 mmol/L (ref 135–145)
Total Bilirubin: 0.9 mg/dL (ref 0.3–1.2)
Total Protein: 7.4 g/dL (ref 6.5–8.1)

## 2022-05-04 LAB — CBC WITH DIFFERENTIAL/PLATELET
Abs Immature Granulocytes: 0.02 10*3/uL (ref 0.00–0.07)
Basophils Absolute: 0 10*3/uL (ref 0.0–0.1)
Basophils Relative: 1 %
Eosinophils Absolute: 0.1 10*3/uL (ref 0.0–0.5)
Eosinophils Relative: 3 %
HCT: 31 % — ABNORMAL LOW (ref 36.0–46.0)
Hemoglobin: 9.8 g/dL — ABNORMAL LOW (ref 12.0–15.0)
Immature Granulocytes: 1 %
Lymphocytes Relative: 61 %
Lymphs Abs: 2.2 10*3/uL (ref 0.7–4.0)
MCH: 31.1 pg (ref 26.0–34.0)
MCHC: 31.6 g/dL (ref 30.0–36.0)
MCV: 98.4 fL (ref 80.0–100.0)
Monocytes Absolute: 0.2 10*3/uL (ref 0.1–1.0)
Monocytes Relative: 6 %
Neutro Abs: 1 10*3/uL — ABNORMAL LOW (ref 1.7–7.7)
Neutrophils Relative %: 28 %
Platelets: 275 10*3/uL (ref 150–400)
RBC: 3.15 MIL/uL — ABNORMAL LOW (ref 3.87–5.11)
RDW: 14.8 % (ref 11.5–15.5)
WBC: 3.6 10*3/uL — ABNORMAL LOW (ref 4.0–10.5)
nRBC: 0 % (ref 0.0–0.2)

## 2022-05-04 MED ORDER — SODIUM CHLORIDE 0.9 % IV SOLN
Freq: Once | INTRAVENOUS | Status: AC
  Filled 2022-05-04: qty 250

## 2022-05-04 MED ORDER — SODIUM CHLORIDE 0.9% FLUSH
10.0000 mL | INTRAVENOUS | Status: DC | PRN
  Administered 2022-05-04: 10 mL
  Filled 2022-05-04: qty 10

## 2022-05-04 MED ORDER — HEPARIN SOD (PORK) LOCK FLUSH 100 UNIT/ML IV SOLN
INTRAVENOUS | Status: AC
  Administered 2022-05-04: 500 [IU]
  Filled 2022-05-04: qty 5

## 2022-05-04 MED ORDER — SODIUM CHLORIDE 0.9 % IV SOLN
10.0000 mg | Freq: Once | INTRAVENOUS | Status: AC
  Administered 2022-05-04: 10 mg via INTRAVENOUS
  Filled 2022-05-04: qty 10

## 2022-05-04 MED ORDER — FAMOTIDINE IN NACL 20-0.9 MG/50ML-% IV SOLN
20.0000 mg | Freq: Once | INTRAVENOUS | Status: AC
  Administered 2022-05-04: 20 mg via INTRAVENOUS
  Filled 2022-05-04: qty 50

## 2022-05-04 MED ORDER — SODIUM CHLORIDE 0.9 % IV SOLN
80.0000 mg/m2 | Freq: Once | INTRAVENOUS | Status: AC
  Administered 2022-05-04: 144 mg via INTRAVENOUS
  Filled 2022-05-04: qty 24

## 2022-05-04 MED ORDER — HEPARIN SOD (PORK) LOCK FLUSH 100 UNIT/ML IV SOLN
500.0000 [IU] | Freq: Once | INTRAVENOUS | Status: AC | PRN
  Filled 2022-05-04: qty 5

## 2022-05-04 MED ORDER — DIPHENHYDRAMINE HCL 50 MG/ML IJ SOLN
50.0000 mg | Freq: Once | INTRAMUSCULAR | Status: AC
  Administered 2022-05-04: 50 mg via INTRAVENOUS
  Filled 2022-05-04: qty 1

## 2022-05-04 NOTE — Patient Instructions (Signed)
[facility] CANCER [facility] AT [place]  Discharge Instructions: ?Thank you for choosing [facility] to provide your oncology and hematology care.  ?If you have a lab appointment with the [facility], please go directly to the [facility] and check in at the registration area. ? ?Wear comfortable clothing and clothing appropriate for easy access to any Portacath or PICC line.  ? ?We strive to give you quality time with your provider. You may need to reschedule your appointment if you arrive late (15 or more minutes).  Arriving late affects you and other patients whose appointments are after yours.  Also, if you miss three or more appointments without notifying the office, you may be dismissed from the clinic at the provider?s discretion.    ?  ?For prescription refill requests, have your pharmacy contact our office and allow 72 hours for refills to be completed.   ? ?Today you received the following chemotherapy and/or immunotherapy agents taxol    ?  ?To help prevent nausea and vomiting after your treatment, we encourage you to take your nausea medication as directed. ? ?BELOW ARE SYMPTOMS THAT SHOULD BE REPORTED IMMEDIATELY: ?*FEVER GREATER THAN 100.4 F (38 ?C) OR HIGHER ?*CHILLS OR SWEATING ?*NAUSEA AND VOMITING THAT IS NOT CONTROLLED WITH YOUR NAUSEA MEDICATION ?*UNUSUAL SHORTNESS OF BREATH ?*UNUSUAL BRUISING OR BLEEDING ?*URINARY PROBLEMS (pain or burning when urinating, or frequent urination) ?*BOWEL PROBLEMS (unusual diarrhea, constipation, pain near the anus) ?TENDERNESS IN MOUTH AND THROAT WITH OR WITHOUT PRESENCE OF ULCERS (sore throat, sores in mouth, or a toothache) ?UNUSUAL RASH, SWELLING OR PAIN  ?UNUSUAL VAGINAL DISCHARGE OR ITCHING  ? ?Items with * indicate a potential emergency and should be followed up as soon as possible or go to the Emergency Department if any problems should occur. ? ?Please show the CHEMOTHERAPY ALERT CARD or IMMUNOTHERAPY ALERT CARD at check-in to the  Emergency Department and triage nurse. ? ?Should you have questions after your visit or need to cancel or reschedule your appointment, please contact [facility] CANCER [place] AT [place]  [phone] and follow the prompts.  Office hours are 8:00 a.m. to 4:30 p.m. Monday - Friday. Please note that voicemails left after 4:00 p.m. may not be returned until the following business day.  We are closed weekends and major holidays. You have access to a nurse at all times for urgent questions. Please call the main number to the clinic [phone] and follow the prompts. ? ?For any non-urgent questions, you may also contact your provider using MyChart. We now offer e-Visits for anyone 67 and older to request care online for non-urgent symptoms. For details visit [email or web address]. ?  ?Also download the MyChart app! Go to the app store, search "MyChart", open the app, select [facility], and log in with your MyChart username and password. ? ?Due to Covid, a mask is required upon entering the hospital/clinic. If you do not have a mask, one will be given to you upon arrival. For doctor visits, patients may have 1 support person aged 49 or older with them. For treatment visits, patients cannot have anyone with them due to current Covid guidelines and our immunocompromised population.  ? ?Paclitaxel injection ?What is this medication? ?PACLITAXEL (PAK li TAX el) is a chemotherapy drug. It targets fast dividing cells, like cancer cells, and causes these cells to die. This medicine is used to treat ovarian cancer, breast cancer, lung cancer, Kaposi's sarcoma, and other cancers. ?This medicine may be used for other purposes; ask  your health care provider or pharmacist if you have questions. ?COMMON BRAND NAME(S): Onxol, Taxol ?What should I tell my care team before I take this medication? ?They need to know if you have any of these conditions: ?history of irregular heartbeat ?liver disease ?low blood counts, like low  white cell, platelet, or red cell counts ?lung or breathing disease, like asthma ?tingling of the fingers or toes, or other nerve disorder ?an unusual or allergic reaction to paclitaxel, alcohol, polyoxyethylated castor oil, other chemotherapy, other medicines, foods, dyes, or preservatives ?pregnant or trying to get pregnant ?breast-feeding ?How should I use this medication? ?This drug is given as an infusion into a vein. It is administered in a hospital or clinic by a specially trained health care professional. ?Talk to your pediatrician regarding the use of this medicine in children. Special care may be needed. ?Overdosage: If you think you have taken too much of this medicine contact a poison control center or emergency room at once. ?NOTE: This medicine is only for you. Do not share this medicine with others. ?What if I miss a dose? ?It is important not to miss your dose. Call your doctor or health care professional if you are unable to keep an appointment. ?What may interact with this medication? ?Do not take this medicine with any of the following medications: ?live virus vaccines ?This medicine may also interact with the following medications: ?antiviral medicines for hepatitis, HIV or AIDS ?certain antibiotics like erythromycin and clarithromycin ?certain medicines for fungal infections like ketoconazole and itraconazole ?certain medicines for seizures like carbamazepine, phenobarbital, phenytoin ?gemfibrozil ?nefazodone ?rifampin ?St. John's wort ?This list may not describe all possible interactions. Give your health care provider a list of all the medicines, herbs, non-prescription drugs, or dietary supplements you use. Also tell them if you smoke, drink alcohol, or use illegal drugs. Some items may interact with your medicine. ?What should I watch for while using this medication? ?Your condition will be monitored carefully while you are receiving this medicine. You will need important blood work done  while you are taking this medicine. ?This medicine can cause serious allergic reactions. To reduce your risk you will need to take other medicine(s) before treatment with this medicine. If you experience allergic reactions like skin rash, itching or hives, swelling of the face, lips, or tongue, tell your doctor or health care professional right away. ?In some cases, you may be given additional medicines to help with side effects. Follow all directions for their use. ?This drug may make you feel generally unwell. This is not uncommon, as chemotherapy can affect healthy cells as well as cancer cells. Report any side effects. Continue your course of treatment even though you feel ill unless your doctor tells you to stop. ?Call your doctor or health care professional for advice if you get a fever, chills or sore throat, or other symptoms of a cold or flu. Do not treat yourself. This drug decreases your body's ability to fight infections. Try to avoid being around people who are sick. ?This medicine may increase your risk to bruise or bleed. Call your doctor or health care professional if you notice any unusual bleeding. ?Be careful brushing and flossing your teeth or using a toothpick because you may get an infection or bleed more easily. If you have any dental work done, tell your dentist you are receiving this medicine. ?Avoid taking products that contain aspirin, acetaminophen, ibuprofen, naproxen, or ketoprofen unless instructed by your doctor. These medicines may hide a   fever. ?Do not become pregnant while taking this medicine. Women should inform their doctor if they wish to become pregnant or think they might be pregnant. There is a potential for serious side effects to an unborn child. Talk to your health care professional or pharmacist for more information. Do not breast-feed an infant while taking this medicine. ?Men are advised not to father a child while receiving this medicine. ?This product may contain  alcohol. Ask your pharmacist or healthcare provider if this medicine contains alcohol. Be sure to tell all healthcare providers you are taking this medicine. Certain medicines, like metronidazole and disulf

## 2022-05-04 NOTE — Progress Notes (Signed)
Patient denies new problems/concerns today.   °

## 2022-05-04 NOTE — Assessment & Plan Note (Addendum)
#  Left breast ipsilateral chest wall recurrence stage IV [2015]-bone/liver metastases/lung nodules ;  CT scan March 10th, 2023-shows interval enlargement of subpleural nodules of the medial right lower lobe, consistent with worsened metastatic disease. Interval enlargement of a subtly hypodense lesion of the anterior left lobe of the liver, consistent with worsened hepatic metastatic Disease. Extensive new and increased osseous sclerosis involving the vertebral bodies, ribs, and bony pelvis, consistent with post treatment sclerosis of osseous metastatic disease.  Currently on Taxol weekly.  Clinically stable. ? ?# proceed with  Taxol weekly cycle #2 day-15 today. Labs today reviewed; see discussion below.  Discussed that tumor markers are rising. Discussed that we will plan reimaging in 1 months/after cycle #3.  ? ?# PN-1- sec to Taxol- monitor for now. STABLE.  ? ?# Bilateral hip pain- arthritis-[s/p ortho evaluation]; on meloxicam/robaxin- refilled.  STABLE.   ? ?# HTN- continue amlodipine/Lisinopril-systolic blood pressure 220U.  Monitor closely. STABLE.  ? ?# Bone lesions: discussed zometa. Recommend ca [500]+vit D [1000 IU]. On Zometa q 4 weeks; hold Zometa calcium 8.5 today  ? ?# Mild anemia hemoglobin- 11-12/ CKD-STABLE.     ? ?# CKD- stage III- GFR 48-50- STABLE.  ? ?# Anxiety/depression- celexa-  STABLE.  ? ?Zometa q4 w; cycle#3-d-8 ? ?# DISPOSITION:  ?# chemo today; HOLD Zometa today ?# follow up in 2 weeks--; MD; cbc/cmp;CEA/ca27-29; Taxol - Dr.B ? ? ? ? ?

## 2022-05-04 NOTE — Progress Notes (Signed)
Pt tolerated all infusions well today with no problems or complaints.  Pt left infusion suite stable and ambulatory.  

## 2022-05-04 NOTE — Progress Notes (Signed)
[place] ?OFFICE PROGRESS NOTE ? ?Patient Care Team: ?[name], MD as PCP - General (Internal Medicine) ?[name], MD (General Surgery) ?Marden Noble, MD (Internal Medicine) ?[name], MD as Consulting Physician (Internal Medicine) ? ? ?SUMMARY OF ONCOLOGIC HISTORY: ? ?Oncology History Overview Note  ?# 2007- LEFT BREAST CA STAGE II [T2N58m; s/p mastec; Dr.Byrnett] ER-Pos/PR Neg; Her- 2 Neu- NEG; ACx4-Taxol x12; Femara; stopped May 2009-stopped follow up. ? ?#JAN 2015- Post mastectomy Chest wall recurrence [chest wall mass bx-]; ER- 90%; PR- NEG; Her2 Neu- NEG; s/p excision [involving skeletal muscle/adipose tissue; clear margins]; CT July 2017- NED;  ? ?# Jan 2020- progression/ hilar- START abema [jan 14th]+ faslodex[jan 13th]; OCT-NOV 2022-PET scan shows progressive disease in the bones to liver.  Discontinue Abema+ Faslodex ? ?# DEC 1st, 2022- START Xeloda 2w/1w; MARCH 10th, 2023-progressive disease-lung bone liver ? ?# end of March 2023- taxol weekly ? ?# DEC 2021- anemia/? CKD [stool ];#Ascending colon mass-incidentally noted on PET scan.  S/p  Dr. AVicente Males  colonoscpy DEC 2022- NEGATIVE; reviewed small adenomas.  ? ?# OSTEOPENIA [BMD- June 2016] ? ?# Kidney cysts ? ?DIAGNOSIS: LEFT BREAST CA ? ?STAGE: IV- NED  ;GOALS: pallaitive ? ? ? ?  ?Breast cancer metastasized to skin ([facility]  ?02/05/2014 Initial Diagnosis  ? Breast cancer metastasized to skin ([facility] ? ?  ?Malignant ne[place] of overlapping sites of left breast in [place], estrogen receptor positive [place]  ?07/24/2016 Initial Diagnosis  ? Cancer of overlapping sites of left female breast ([facility] ? ?  ?01/06/2019 - 08/05/2020 Chemotherapy  ? Patient is on Treatment Plan : BREAST Abemaciclib + Fulvestrant q28d  ? ?   ?03/22/2022 -  Chemotherapy  ? Patient is on Treatment Plan : BREAST Paclitaxel D1,8,15 q28d  ? ?   ? ?INTERVAL HISTORY: Accompanied by  sister; Ambulating independently. ? ?A very pleasant 81 year old  African-American female patient with above history of ER/PR positive breast cancer stage IV currently on Taxol single agent is here for follow-up. ? ?Patient continues to complain of mild fatigue.  Denies any worsening cough.  Mild shortness of breath on exertion. ? ?No nausea no vomiting.  No fever no chills.  No rash on palms and soles.  Chronic joint pains.  Not worse.  No sores in the mouth.  No diarrhea. ? ?Review of Systems  ?Constitutional:  Positive for malaise/fatigue. Negative for chills, diaphoresis, fever and weight loss.  ?HENT:  Negative for nosebleeds and sore throat.   ?Eyes:  Negative for double vision.  ?Respiratory:  Negative for cough, hemoptysis, sputum production, shortness of breath and wheezing.   ?Cardiovascular:  Negative for chest pain, palpitations, orthopnea and leg swelling.  ?Gastrointestinal:  Negative for abdominal pain, blood in stool, constipation, diarrhea, heartburn, melena, nausea and vomiting.  ?Musculoskeletal:  Positive for back pain and joint pain.  ?Skin: Negative.  Negative for itching and rash.  ?Neurological:  Negative for tingling, focal weakness, weakness and headaches.  ?Endo/Heme/Allergies:  Does not bruise/bleed easily.  ?Psychiatric/Behavioral:  Negative for depression. The patient is not nervous/anxious and does not have insomnia.   ?PAST MEDICAL HISTORY :  ?Past Medical History:  ?Diagnosis Date  ? [place] ([place]  ? Arthritis   ? Cancer ([facility]  ? diagnosed 01/07 left breast with simple mastectomu & sn bx. The pt had a 3.5cm tumor. Frozen section report on the 2 sn were negative. On permanent sections a 0.526mmicrometastasis was identified. She was not felt to  require a complete axillary dissection. She has completed her Tamoxifen therapy and has been released from routine f/u with medical oncology service.  ? COPD (chronic obstructive pulmonary disease) ([place])   ? Diabetes mellitus without complication ([place])   ? Hyperlipidemia   ? Hypertension   ? since  age 29  ? Malignant neoplasm of upper-outer quadrant of female breast ([place]) 12/2013  ? Mastectomy site recurrence resected 01/26/2014, ER 90%, PR 0%, HER-2/neu nonamplified.  ? Other benign neoplasm of connective and other soft tissue of trunk, unspecified 2014  ? Personal history of colonic polyps   ? Personal history of tobacco use, presenting hazards to health   ? Special screening for malignant neoplasms, colon   ? Unspecified disorder of skin and subcutaneous tissue 02/19/2013  ? biopsy of skin over the sternum on the right breast showed hypertrophic scar,keloid formation.  ? ? ?PAST SURGICAL HISTORY :   ?Past Surgical History:  ?Procedure Laterality Date  ? ABDOMINAL HYSTERECTOMY  2004  ? total  ? BREAST BIOPSY Right January 26, 2014  ? Core biopsy for mild increase breast uptake on PET scan, usual ductal hyperplasia and stromal fibrosis  ? BREAST SURGERY Left 2007  ? mastectomy  ? chest wall mass  01/26/14  ? CHOLECYSTECTOMY  2007  ? COLONOSCOPY W/ POLYPECTOMY  2011  ? Dr. Brynda Greathouse  ? COLONOSCOPY WITH PROPOFOL N/A 12/22/2021  ? Procedure: COLONOSCOPY WITH PROPOFOL;  Surgeon: Jonathon Bellows, MD;  Location: [facility] ENDOSCOPY;  Service: Gastroenterology;  Laterality: N/A;  ? EYE SURGERY Right   ? cataract surgery  ? head surgery  1991  ? IR IMAGING GUIDED PORT INSERTION  03/13/2022  ? MASTECTOMY Left 2007  ? PORT-A-CATH REMOVAL  2008  ? PORTACATH PLACEMENT  2007  ? ? ?FAMILY HISTORY :   ?Family History  ?Problem Relation Age of Onset  ? Cancer Other   ?     ovarian,breast,colon cancers;relations not listed  ? Breast cancer Neg Hx   ? ? ?SOCIAL HISTORY:   ?Social History  ? ?Tobacco Use  ? Smoking status: Some Days  ?  Packs/day: 1.00  ?  Years: 50.00  ?  Pack years: 50.00  ?  Types: Cigarettes  ? Smokeless tobacco: Never  ?Vaping Use  ? Vaping Use: Some days  ?Substance Use Topics  ? Alcohol use: No  ?  Alcohol/week: 0.0 standard drinks  ? Drug use: No  ? ? ?ALLERGIES:  is allergic to metoprolol. ? ?MEDICATIONS:  ?Current  Outpatient Medications  ?Medication Sig Dispense Refill  ? amLODipine (NORVASC) 10 MG tablet TAKE 1 TABLET(10 MG) BY MOUTH DAILY 90 tablet 1  ? atorvastatin (LIPITOR) 20 MG tablet Take 20 mg by mouth daily at 6 PM.    ? citalopram (CELEXA) 40 MG tablet TAKE 1 TABLET(40 MG) BY MOUTH DAILY 90 tablet 1  ? lidocaine-prilocaine (EMLA) cream Apply on the port. 30 -45 min  prior to port access. 30 g 3  ? lisinopril (ZESTRIL) 2.5 MG tablet Take 1 tablet (2.5 mg total) by mouth daily. 90 tablet 1  ? meloxicam (MOBIC) 15 MG tablet TAKE 1 TABLET(15 MG) BY MOUTH DAILY 90 tablet 1  ? methocarbamol (ROBAXIN) 500 MG tablet Take 1 tablet (500 mg total) by mouth in the morning and at bedtime. 60 tablet 1  ? ondansetron (ZOFRAN) 8 MG tablet One pill every 8 hours as needed for nausea/vomitting. 40 tablet 1  ? prochlorperazine (COMPAZINE) 10 MG tablet TAKE 1 TABLET(10 MG) BY MOUTH EVERY  6 HOURS AS NEEDED FOR NAUSEA OR VOMITING 40 tablet 1  ? azithromycin (ZITHROMAX) 250 MG tablet Take 2 on day 1; and then 1 pill once a day. (Patient not taking: Reported on 05/04/2022) 6 each 0  ? predniSONE (DELTASONE) 20 MG tablet Take 1 tablet (20 mg total) by mouth daily with breakfast. Once a day with food. (Patient not taking: Reported on 05/04/2022) 14 tablet 0  ? ?No current facility-administered medications for this visit.  ? ?Facility-Administered Medications Ordered in Other Visits  ?Medication Dose Route Frequency Provider Last Rate Last Admin  ? heparin lock flush 100 unit/mL  500 Units Intracatheter Once PRN Brahmanday, Govinda R, MD      ? PACLitaxel (TAXOL) 144 mg in sodium chloride 0.9 % 250 mL chemo infusion (</= 80mg/m2)  80 mg/m2 (Treatment Plan Recorded) Intravenous Once Brahmanday, Govinda R, MD 274 mL/hr at 05/04/22 1302 144 mg at 05/04/22 1302  ? sodium chloride flush (NS) 0.9 % injection 10 mL  10 mL Intracatheter PRN Brahmanday, Govinda R, MD      ? ? ?PHYSICAL EXAMINATION: ?ECOG PERFORMANCE STATUS: 0 - Asymptomatic ? ?BP (!)  170/79   Pulse 75   Temp 98 ?F (36.7 ?C)   Resp 16   Wt 149 lb 9.6 oz (67.9 kg)   SpO2 99%   BMI 24.15 kg/m?  ? ?Filed Weights  ? 05/04/22 1100  ?Weight: 149 lb 9.6 oz (67.9 kg)  ? ? ? ?Physical Exam ?HENT

## 2022-05-05 LAB — CANCER ANTIGEN 27.29: CA 27.29: 147.3 U/mL — ABNORMAL HIGH (ref 0.0–38.6)

## 2022-05-06 LAB — CEA: CEA: 8.1 ng/mL — ABNORMAL HIGH (ref 0.0–4.7)

## 2022-05-17 ENCOUNTER — Other Ambulatory Visit: Payer: Self-pay

## 2022-05-17 DIAGNOSIS — C50812 Malignant neoplasm of overlapping sites of left female breast: Secondary | ICD-10-CM

## 2022-05-17 MED FILL — Dexamethasone Sodium Phosphate Inj 100 MG/10ML: INTRAMUSCULAR | Qty: 1 | Status: AC

## 2022-05-18 ENCOUNTER — Inpatient Hospital Stay: Payer: Medicare HMO

## 2022-05-18 ENCOUNTER — Inpatient Hospital Stay (HOSPITAL_BASED_OUTPATIENT_CLINIC_OR_DEPARTMENT_OTHER): Payer: Medicare HMO | Admitting: Internal Medicine

## 2022-05-18 ENCOUNTER — Encounter: Payer: Self-pay | Admitting: Internal Medicine

## 2022-05-18 DIAGNOSIS — Z5111 Encounter for antineoplastic chemotherapy: Secondary | ICD-10-CM | POA: Diagnosis not present

## 2022-05-18 DIAGNOSIS — C50812 Malignant neoplasm of overlapping sites of left female breast: Secondary | ICD-10-CM

## 2022-05-18 DIAGNOSIS — C787 Secondary malignant neoplasm of liver and intrahepatic bile duct: Secondary | ICD-10-CM | POA: Diagnosis not present

## 2022-05-18 DIAGNOSIS — Z170 Estrogen receptor positive status [ER+]: Secondary | ICD-10-CM

## 2022-05-18 DIAGNOSIS — C7801 Secondary malignant neoplasm of right lung: Secondary | ICD-10-CM | POA: Diagnosis not present

## 2022-05-18 DIAGNOSIS — C7951 Secondary malignant neoplasm of bone: Secondary | ICD-10-CM | POA: Diagnosis not present

## 2022-05-18 LAB — CBC WITH DIFFERENTIAL/PLATELET
Abs Immature Granulocytes: 0.05 10*3/uL (ref 0.00–0.07)
Basophils Absolute: 0 10*3/uL (ref 0.0–0.1)
Basophils Relative: 0 %
Eosinophils Absolute: 0.1 10*3/uL (ref 0.0–0.5)
Eosinophils Relative: 1 %
HCT: 31.5 % — ABNORMAL LOW (ref 36.0–46.0)
Hemoglobin: 10 g/dL — ABNORMAL LOW (ref 12.0–15.0)
Immature Granulocytes: 1 %
Lymphocytes Relative: 35 %
Lymphs Abs: 2.4 10*3/uL (ref 0.7–4.0)
MCH: 30.8 pg (ref 26.0–34.0)
MCHC: 31.7 g/dL (ref 30.0–36.0)
MCV: 96.9 fL (ref 80.0–100.0)
Monocytes Absolute: 1.1 10*3/uL — ABNORMAL HIGH (ref 0.1–1.0)
Monocytes Relative: 16 %
Neutro Abs: 3.4 10*3/uL (ref 1.7–7.7)
Neutrophils Relative %: 47 %
Platelets: 247 10*3/uL (ref 150–400)
RBC: 3.25 MIL/uL — ABNORMAL LOW (ref 3.87–5.11)
RDW: 15.4 % (ref 11.5–15.5)
WBC: 7.1 10*3/uL (ref 4.0–10.5)
nRBC: 0 % (ref 0.0–0.2)

## 2022-05-18 LAB — COMPREHENSIVE METABOLIC PANEL
ALT: 14 U/L (ref 0–44)
AST: 31 U/L (ref 15–41)
Albumin: 3.8 g/dL (ref 3.5–5.0)
Alkaline Phosphatase: 65 U/L (ref 38–126)
Anion gap: 10 (ref 5–15)
BUN: 17 mg/dL (ref 8–23)
CO2: 25 mmol/L (ref 22–32)
Calcium: 8.8 mg/dL — ABNORMAL LOW (ref 8.9–10.3)
Chloride: 102 mmol/L (ref 98–111)
Creatinine, Ser: 0.94 mg/dL (ref 0.44–1.00)
GFR, Estimated: >60 mL/min (ref >60)
Glucose, Bld: 182 mg/dL — ABNORMAL HIGH (ref 70–99)
Potassium: 3.4 mmol/L — ABNORMAL LOW (ref 3.5–5.1)
Sodium: 137 mmol/L (ref 135–145)
Total Bilirubin: 0.8 mg/dL (ref 0.3–1.2)
Total Protein: 7.3 g/dL (ref 6.5–8.1)

## 2022-05-18 MED ORDER — HEPARIN SOD (PORK) LOCK FLUSH 100 UNIT/ML IV SOLN
500.0000 [IU] | Freq: Once | INTRAVENOUS | Status: AC | PRN
  Administered 2022-05-18: 500 [IU]
  Filled 2022-05-18: qty 5

## 2022-05-18 MED ORDER — SODIUM CHLORIDE 0.9 % IV SOLN
80.0000 mg/m2 | Freq: Once | INTRAVENOUS | Status: AC
  Administered 2022-05-18: 144 mg via INTRAVENOUS
  Filled 2022-05-18: qty 24

## 2022-05-18 MED ORDER — FAMOTIDINE IN NACL 20-0.9 MG/50ML-% IV SOLN
20.0000 mg | Freq: Once | INTRAVENOUS | Status: AC
  Administered 2022-05-18: 20 mg via INTRAVENOUS
  Filled 2022-05-18: qty 50

## 2022-05-18 MED ORDER — SODIUM CHLORIDE 0.9 % IV SOLN
10.0000 mg | Freq: Once | INTRAVENOUS | Status: AC
  Administered 2022-05-18: 10 mg via INTRAVENOUS
  Filled 2022-05-18: qty 10

## 2022-05-18 MED ORDER — SODIUM CHLORIDE 0.9 % IV SOLN
Freq: Once | INTRAVENOUS | Status: AC
  Filled 2022-05-18: qty 250

## 2022-05-18 MED ORDER — DIPHENHYDRAMINE HCL 50 MG/ML IJ SOLN
50.0000 mg | Freq: Once | INTRAMUSCULAR | Status: AC
  Administered 2022-05-18: 50 mg via INTRAVENOUS
  Filled 2022-05-18: qty 1

## 2022-05-18 NOTE — Assessment & Plan Note (Addendum)
#  Left breast ipsilateral chest wall recurrence stage IV [2015]-bone/liver metastases/lung nodules ;  CT scan March 10th, 2023-shows interval enlargement of subpleural nodules of the medial right lower lobe, consistent with worsened metastatic disease. Interval enlargement of a subtly hypodense lesion of the anterior left lobe of the liver, consistent with worsened hepatic metastatic Disease. Extensive new and increased osseous sclerosis involving the vertebral bodies, ribs, and bony pelvis, consistent with post treatment sclerosis of osseous metastatic disease.  Currently on Taxol weekly.  Clinically stable.   # proceed with  Taxol weekly cycle #3 day-1 today. Labs today reviewed; see discussion below.  Discussed that tumor markers are rising/STABLE. Discussed that we will plan reimaging in 1 months/after cycle #3. Will order at next visit.  Again discussed with the patient and daughter that treatments are non-curative; treatments are recommended indefinitely/until progression or toxicity.  # PN-1- sec to Taxol- monitor for now. STABLE.   #Hypocalcemia-continue calcium plus vitamin D twice daily; check vitamin D levels  # Bilateral hip pain- arthritis-[s/p ortho evaluation]; on meloxicam/robaxin- refilled.   STABLE.   # HTN- continue amlodipine/Lisinopril-systolic blood pressure 627O.  Monitor closely.  STABLE.   # Bone lesions: discussed zometa. Recommend ca [500]+vit D [1000 IU]. On Zometa q 4 weeks; hold Zometa calcium 8.5 today   # Mild anemia hemoglobin- 11-12/ CKD- STABLE.       # CKD- stage III- GFR 48-50- STABLE.   # Anxiety/depression- celexa-  STABLE.   ? Zometa q4 w; cycle#3-d-8  # DISPOSITION:  # chemo today;  # 1 week- labs- cbc/cmp;taxol # follow up in 2 weeks--; MD; cbc/cmp;CEA/ca27-29; v25-OH Vit D levels -Taxol - Dr.B

## 2022-05-18 NOTE — Patient Instructions (Signed)
[facility]  Discharge Instructions: Thank you for choosing [facility] to provide your oncology and hematology care.  If you have a lab appointment with the Cancer Center, please go directly to the Cancer Center and check in at the registration area.  Wear comfortable clothing and clothing appropriate for easy access to any Portacath or PICC line.   We strive to give you quality time with your provider. You may need to reschedule your appointment if you arrive late (15 or more minutes).  Arriving late affects you and other patients whose appointments are after yours.  Also, if you miss three or more appointments without notifying the office, you may be dismissed from the clinic at the provider's discretion.      For prescription refill requests, have your pharmacy contact our office and allow 72 hours for refills to be completed.    Today you received the following chemotherapy and/or immunotherapy agents Taxol      To help prevent nausea and vomiting after your treatment, we encourage you to take your nausea medication as directed.  BELOW ARE SYMPTOMS THAT SHOULD BE REPORTED IMMEDIATELY: *FEVER GREATER THAN 100.4 F (38 C) OR HIGHER *CHILLS OR SWEATING *NAUSEA AND VOMITING THAT IS NOT CONTROLLED WITH YOUR NAUSEA MEDICATION *UNUSUAL SHORTNESS OF BREATH *UNUSUAL BRUISING OR BLEEDING *URINARY PROBLEMS (pain or burning when urinating, or frequent urination) *BOWEL PROBLEMS (unusual diarrhea, constipation, pain near the anus) TENDERNESS IN MOUTH AND THROAT WITH OR WITHOUT PRESENCE OF ULCERS (sore throat, sores in mouth, or a toothache) UNUSUAL RASH, SWELLING OR PAIN  UNUSUAL VAGINAL DISCHARGE OR ITCHING   Items with * indicate a potential emergency and should be followed up as soon as possible or go to the Emergency Department if any problems should occur.  Please show the CHEMOTHERAPY ALERT CARD or IMMUNOTHERAPY ALERT CARD at check-in to the  Emergency Department and triage nurse.  Should you have questions after your visit or need to cancel or reschedule your appointment, please contact [facility]  [phone] and follow the prompts.  Office hours are 8:00 a.m. to 4:30 p.m. Monday - Friday. Please note that voicemails left after 4:00 p.m. may not be returned until the following business day.  We are closed weekends and major holidays. You have access to a nurse at all times for urgent questions. Please call the main number to the clinic [phone] and follow the prompts.  For any non-urgent questions, you may also contact your provider using MyChart. We now offer e-Visits for anyone 18 and older to request care online for non-urgent symptoms. For details visit [email or web address].   Also download the MyChart app! Go to the app store, search "MyChart", open the app, select [place], and log in with your MyChart username and password.  Due to Covid, a mask is required upon entering the hospital/clinic. If you do not have a mask, one will be given to you upon arrival. For doctor visits, patients may have 1 support person aged 18 or older with them. For treatment visits, patients cannot have anyone with them due to current Covid guidelines and our immunocompromised population.  

## 2022-05-18 NOTE — Progress Notes (Signed)
[place] OFFICE PROGRESS NOTE  Patient Care Team: [name], MD as PCP - General (Internal Medicine) Bary Castilla, Forest Gleason, MD (General Surgery) Marden Noble, MD (Internal Medicine) [name], MD as Consulting Physician (Internal Medicine)   SUMMARY OF ONCOLOGIC HISTORY:  Oncology History Overview Note  # 2007- LEFT BREAST CA STAGE II [T2N41m; s/p mastec; Dr.Byrnett] ER-Pos/PR Neg; Her- 2 Neu- NEG; ACx4-Taxol x12; Femara; stopped May 2009-stopped follow up.  #JAN 2015- Post mastectomy Chest wall recurrence [chest wall mass bx-]; ER- 90%; PR- NEG; Her2 Neu- NEG; s/p excision [involving skeletal muscle/adipose tissue; clear margins]; CT July 2017- NED;   # Jan 2020- progression/ hilar- START abema [jan 14th]+ faslodex[jan 13th]; OCT-NOV 2022-PET scan shows progressive disease in the bones to liver.  Discontinue Abema+ Faslodex  # DEC 1st, 2022- START Xeloda 2w/1w; MARCH 10th, 2023-progressive disease-lung bone liver  # end of March 2023- taxol weekly  # DEC 2021- anemia/? CKD [stool ];#Ascending colon mass-incidentally noted on PET scan.  S/p  Dr. AVicente Males  colonoscpy DEC 2022- NEGATIVE; reviewed small adenomas.   # OSTEOPENIA [BMD- June 2016]  # Kidney cysts  DIAGNOSIS: LEFT BREAST CA  STAGE: IV- NED  ;GOALS: pallaitive      Breast cancer metastasized to skin ([facility]  02/05/2014 Initial Diagnosis   Breast cancer metastasized to skin ([facility]    Malignant neoplasm of overlapping sites of left breast in female, estrogen receptor positive ([facility]  07/24/2016 Initial Diagnosis   Cancer of overlapping sites of left female breast ([facility]    01/06/2019 - 08/05/2020 Chemotherapy   Patient is on Treatment Plan : BREAST Abemaciclib + Fulvestrant q28d      03/22/2022 -  Chemotherapy   Patient is on Treatment Plan : BREAST Paclitaxel D1,8,15 q28d       INTERVAL HISTORY: Accompanied by  daughter; Ambulating independently.  A very pleasant 81year old  African-American female patient with above history of ER/PR positive breast cancer stage IV currently on Taxol single agent is here for follow-up.  Patient continues to complain of mild fatigue.  Denies any significant tingling or numbness extremities.  No falls.  No nausea no vomiting.  No fever no chills.  No rash on palms and soles.  Chronic joint pains.  Not worse.  No sores in the mouth.  No diarrhea.  Review of Systems  Constitutional:  Positive for malaise/fatigue. Negative for chills, diaphoresis, fever and weight loss.  HENT:  Negative for nosebleeds and sore throat.   Eyes:  Negative for double vision.  Respiratory:  Negative for cough, hemoptysis, sputum production, shortness of breath and wheezing.   Cardiovascular:  Negative for chest pain, palpitations, orthopnea and leg swelling.  Gastrointestinal:  Negative for abdominal pain, blood in stool, constipation, diarrhea, heartburn, melena, nausea and vomiting.  Musculoskeletal:  Positive for back pain and joint pain.  Skin: Negative.  Negative for itching and rash.  Neurological:  Negative for tingling, focal weakness, weakness and headaches.  Endo/Heme/Allergies:  Does not bruise/bleed easily.  Psychiatric/Behavioral:  Negative for depression. The patient is not nervous/anxious and does not have insomnia.   PAST MEDICAL HISTORY :  Past Medical History:  Diagnosis Date   [place] ([place]   Arthritis    Cancer ([facility]   diagnosed 01/07 left breast with simple mastectomu & sn bx. The pt had a 3.5cm tumor. Frozen section report on the 2 sn were negative. On permanent sections a 0.526mmicrometastasis was identified. She was not felt to require  a complete axillary dissection. She has completed her Tamoxifen therapy and has been released from routine f/u with medical oncology service.   COPD (chronic obstructive pulmonary disease) (HCC)    Diabetes mellitus without complication ([place])    Hyperlipidemia    Hypertension    since  age 40   Malignant neoplasm of upper-outer quadrant of female breast ([place]) 12/2013   Mastectomy site recurrence resected 01/26/2014, ER 90%, PR 0%, HER-2/neu nonamplified.   Other benign neoplasm of connective and other soft tissue of trunk, unspecified 2014   Personal history of colonic polyps    Personal history of tobacco use, presenting hazards to health    Special screening for malignant neoplasms, colon    Unspecified disorder of skin and subcutaneous tissue 02/19/2013   biopsy of skin over the sternum on the right breast showed hypertrophic scar,keloid formation.    PAST SURGICAL HISTORY :   Past Surgical History:  Procedure Laterality Date   ABDOMINAL HYSTERECTOMY  2004   total   BREAST BIOPSY Right January 26, 2014   Core biopsy for mild increase breast uptake on PET scan, usual ductal hyperplasia and stromal fibrosis   BREAST SURGERY Left 2007   mastectomy   chest wall mass  01/26/14   CHOLECYSTECTOMY  2007   COLONOSCOPY W/ POLYPECTOMY  2011   Dr. Brynda Greathouse   COLONOSCOPY WITH PROPOFOL N/A 12/22/2021   Procedure: COLONOSCOPY WITH PROPOFOL;  Surgeon: Jonathon Bellows, MD;  Location: [facility] ENDOSCOPY;  Service: Gastroenterology;  Laterality: N/A;   EYE SURGERY Right    cataract surgery   head surgery  1991   IR IMAGING GUIDED PORT INSERTION  03/13/2022   MASTECTOMY Left 2007   PORT-A-CATH REMOVAL  2008   PORTACATH PLACEMENT  2007    FAMILY HISTORY :   Family History  Problem Relation Age of Onset   Cancer Other        ovarian,breast,colon cancers;relations not listed   Breast cancer Neg Hx     SOCIAL HISTORY:   Social History   Tobacco Use   Smoking status: Some Days    Packs/day: 1.00    Years: 50.00    Pack years: 50.00    Types: Cigarettes   Smokeless tobacco: Never  Vaping Use   Vaping Use: Some days  Substance Use Topics   Alcohol use: No    Alcohol/week: 0.0 standard drinks   Drug use: No    ALLERGIES:  is allergic to metoprolol.  MEDICATIONS:  Current  Outpatient Medications  Medication Sig Dispense Refill   amLODipine (NORVASC) 10 MG tablet TAKE 1 TABLET(10 MG) BY MOUTH DAILY 90 tablet 1   atorvastatin (LIPITOR) 20 MG tablet Take 20 mg by mouth daily at 6 PM.     citalopram (CELEXA) 40 MG tablet TAKE 1 TABLET(40 MG) BY MOUTH DAILY 90 tablet 1   lidocaine-prilocaine (EMLA) cream Apply on the port. 30 -45 min  prior to port access. 30 g 3   lisinopril (ZESTRIL) 2.5 MG tablet Take 1 tablet (2.5 mg total) by mouth daily. 90 tablet 1   meloxicam (MOBIC) 15 MG tablet TAKE 1 TABLET(15 MG) BY MOUTH DAILY 90 tablet 1   methocarbamol (ROBAXIN) 500 MG tablet Take 1 tablet (500 mg total) by mouth in the morning and at bedtime. 60 tablet 1   ondansetron (ZOFRAN) 8 MG tablet One pill every 8 hours as needed for nausea/vomitting. 40 tablet 1   prochlorperazine (COMPAZINE) 10 MG tablet TAKE 1 TABLET(10 MG) BY MOUTH EVERY 6  HOURS AS NEEDED FOR NAUSEA OR VOMITING 40 tablet 1   No current facility-administered medications for this visit.    PHYSICAL EXAMINATION: ECOG PERFORMANCE STATUS: 0 - Asymptomatic  BP (!) 147/73 (BP Location: Right Arm, Patient Position: Sitting, Cuff Size: Normal)   Pulse 72   Temp (!) 97.5 F (36.4 C) (Tympanic)   Ht _0  (1.676 m)   Wt 150 lb 9.6 oz (68.3 kg)   SpO2 100%   BMI 24.31 kg/m   Filed Weights   05/18/22 0840  Weight: 150 lb 9.6 oz (68.3 kg)     Physical Exam HENT:     Head: Normocephalic and atraumatic.     Mouth/Throat:     Pharynx: No oropharyngeal exudate.  Eyes:     Pupils: Pupils are equal, round, and reactive to light.  Cardiovascular:     Rate and Rhythm: Normal rate and regular rhythm.  Pulmonary:     Effort: No respiratory distress.     Breath sounds: No wheezing.  Abdominal:     General: Bowel sounds are normal. There is no distension.     Palpations: Abdomen is soft. There is no mass.     Tenderness: There is no abdominal tenderness. There is no guarding or rebound.  Musculoskeletal:         General: No tenderness. Normal range of motion.     Cervical back: Normal range of motion and neck supple.  Skin:    General: Skin is warm.  Neurological:     Mental Status: She is alert and oriented to person, place, and time.  Psychiatric:        Mood and Affect: Affect normal.   LABORATORY DATA:  I have reviewed the data as listed    Component Value Date/Time   NA 137 05/18/2022 0832   NA 139 05/30/2014 0127   K 3.4 (L) 05/18/2022 0832   K 3.7 05/30/2014 0127   CL 102 05/18/2022 0832   CL 106 05/30/2014 0127   CO2 25 05/18/2022 0832   CO2 25 05/30/2014 0127   GLUCOSE 182 (H) 05/18/2022 0832   GLUCOSE 136 (H) 05/30/2014 0127   BUN 17 05/18/2022 0832   BUN 19 (H) 05/30/2014 0127   CREATININE 0.94 05/18/2022 0832   CREATININE 0.98 05/30/2014 0127   CALCIUM 8.8 (L) 05/18/2022 0832   CALCIUM 9.0 05/30/2014 0127   PROT 7.3 05/18/2022 0832   PROT 7.8 05/08/2014 1131   ALBUMIN 3.8 05/18/2022 0832   ALBUMIN 3.7 05/08/2014 1131   AST 31 05/18/2022 0832   AST 20 05/08/2014 1131   ALT 14 05/18/2022 0832   ALT 18 05/08/2014 1131   ALKPHOS 65 05/18/2022 0832   ALKPHOS 58 05/08/2014 1131   BILITOT 0.8 05/18/2022 0832   BILITOT 0.4 05/08/2014 1131   GFRNONAA >60 05/18/2022 0832   GFRNONAA 58 (L) 05/30/2014 0127   GFRAA 47 (L) 09/02/2020 0920   GFRAA >60 05/30/2014 0127    No results found for: SPEP, UPEP  Lab Results  Component Value Date   WBC 7.1 05/18/2022   NEUTROABS 3.4 05/18/2022   HGB 10.0 (L) 05/18/2022   HCT 31.5 (L) 05/18/2022   MCV 96.9 05/18/2022   PLT 247 05/18/2022      Chemistry      Component Value Date/Time   NA 137 05/18/2022 0832   NA 139 05/30/2014 0127   K 3.4 (L) 05/18/2022 0832   K 3.7 05/30/2014 0127   CL 102 05/18/2022 0832   CL  106 05/30/2014 0127   CO2 25 05/18/2022 0832   CO2 25 05/30/2014 0127   BUN 17 05/18/2022 0832   BUN 19 (H) 05/30/2014 0127   CREATININE 0.94 05/18/2022 0832   CREATININE 0.98 05/30/2014 0127       Component Value Date/Time   CALCIUM 8.8 (L) 05/18/2022 0832   CALCIUM 9.0 05/30/2014 0127   ALKPHOS 65 05/18/2022 0832   ALKPHOS 58 05/08/2014 1131   AST 31 05/18/2022 0832   AST 20 05/08/2014 1131   ALT 14 05/18/2022 0832   ALT 18 05/08/2014 1131   BILITOT 0.8 05/18/2022 0832   BILITOT 0.4 05/08/2014 1131         RADIOGRAPHIC STUDIES: I have personally reviewed the radiological images as listed and agreed with the findings in the report. No results found.   ASSESSMENT & PLAN:   Malignant neoplasm of overlapping sites of left breast in female, estrogen receptor positive ([place]) #Left breast ipsilateral chest wall recurrence stage IV [2015]-bone/liver metastases/lung nodules ;  CT scan March 10th, 2023-shows interval enlargement of subpleural nodules of the medial right lower lobe, consistent with worsened metastatic disease. Interval enlargement of a subtly hypodense lesion of the anterior left lobe of the liver, consistent with worsened hepatic metastatic Disease. Extensive new and increased osseous sclerosis involving the vertebral bodies, ribs, and bony pelvis, consistent with post treatment sclerosis of osseous metastatic disease.  Currently on Taxol weekly.  Clinically stable.   # proceed with  Taxol weekly cycle #3 day-1 today. Labs today reviewed; see discussion below.  Discussed that tumor markers are rising/STABLE. Discussed that we will plan reimaging in 1 months/after cycle #3. Will order at next visit.  Again discussed with the patient and daughter that treatments are non-curative; treatments are recommended indefinitely/until progression or toxicity.  # PN-1- sec to Taxol- monitor for now. STABLE.   #Hypocalcemia-continue calcium plus vitamin D twice daily; check vitamin D levels  # Bilateral hip pain- arthritis-[s/p ortho evaluation]; on meloxicam/robaxin- refilled.   STABLE.   # HTN- continue amlodipine/Lisinopril-systolic blood pressure 038B.  Monitor closely.   STABLE.   # Bone lesions: discussed zometa. Recommend ca [500]+vit D [1000 IU]. On Zometa q 4 weeks; hold Zometa calcium 8.5 today   # Mild anemia hemoglobin- 11-12/ CKD- STABLE.       # CKD- stage III- GFR 48-50- STABLE.   # Anxiety/depression- celexa-  STABLE.   ? Zometa q4 w; cycle#3-d-8  # DISPOSITION:  # chemo today;  # 1 week- labs- cbc/cmp;taxol # follow up in 2 weeks--; MD; cbc/cmp;CEA/ca27-29; v25-OH Vit D levels -Taxol - Dr.B          [name], MD 05/18/2022 8:26 PM

## 2022-05-19 LAB — CANCER ANTIGEN 27.29: CA 27.29: 165.5 U/mL — ABNORMAL HIGH (ref 0.0–38.6)

## 2022-05-19 LAB — CEA: CEA: 10.1 ng/mL — ABNORMAL HIGH (ref 0.0–4.7)

## 2022-05-24 MED FILL — Dexamethasone Sodium Phosphate Inj 100 MG/10ML: INTRAMUSCULAR | Qty: 1 | Status: AC

## 2022-05-25 ENCOUNTER — Inpatient Hospital Stay: Payer: Medicare HMO | Attending: Internal Medicine

## 2022-05-25 ENCOUNTER — Inpatient Hospital Stay: Payer: Medicare HMO

## 2022-05-25 VITALS — BP 143/65 | HR 75 | Temp 97.0°F | Resp 17 | Wt 151.1 lb

## 2022-05-25 DIAGNOSIS — D649 Anemia, unspecified: Secondary | ICD-10-CM | POA: Insufficient documentation

## 2022-05-25 DIAGNOSIS — C50812 Malignant neoplasm of overlapping sites of left female breast: Secondary | ICD-10-CM | POA: Diagnosis not present

## 2022-05-25 DIAGNOSIS — C787 Secondary malignant neoplasm of liver and intrahepatic bile duct: Secondary | ICD-10-CM | POA: Diagnosis not present

## 2022-05-25 DIAGNOSIS — Z79899 Other long term (current) drug therapy: Secondary | ICD-10-CM | POA: Insufficient documentation

## 2022-05-25 DIAGNOSIS — Z5111 Encounter for antineoplastic chemotherapy: Secondary | ICD-10-CM | POA: Insufficient documentation

## 2022-05-25 DIAGNOSIS — C7951 Secondary malignant neoplasm of bone: Secondary | ICD-10-CM | POA: Diagnosis not present

## 2022-05-25 DIAGNOSIS — Z170 Estrogen receptor positive status [ER+]: Secondary | ICD-10-CM

## 2022-05-25 LAB — CBC WITH DIFFERENTIAL/PLATELET
Abs Immature Granulocytes: 0.05 10*3/uL (ref 0.00–0.07)
Basophils Absolute: 0 10*3/uL (ref 0.0–0.1)
Basophils Relative: 1 %
Eosinophils Absolute: 0.2 10*3/uL (ref 0.0–0.5)
Eosinophils Relative: 2 %
HCT: 31.4 % — ABNORMAL LOW (ref 36.0–46.0)
Hemoglobin: 10.1 g/dL — ABNORMAL LOW (ref 12.0–15.0)
Immature Granulocytes: 1 %
Lymphocytes Relative: 39 %
Lymphs Abs: 3.1 10*3/uL (ref 0.7–4.0)
MCH: 30.8 pg (ref 26.0–34.0)
MCHC: 32.2 g/dL (ref 30.0–36.0)
MCV: 95.7 fL (ref 80.0–100.0)
Monocytes Absolute: 0.5 10*3/uL (ref 0.1–1.0)
Monocytes Relative: 6 %
Neutro Abs: 4 10*3/uL (ref 1.7–7.7)
Neutrophils Relative %: 51 %
Platelets: 331 10*3/uL (ref 150–400)
RBC: 3.28 MIL/uL — ABNORMAL LOW (ref 3.87–5.11)
RDW: 15.1 % (ref 11.5–15.5)
WBC: 7.9 10*3/uL (ref 4.0–10.5)
nRBC: 0 % (ref 0.0–0.2)

## 2022-05-25 LAB — COMPREHENSIVE METABOLIC PANEL
ALT: 13 U/L (ref 0–44)
AST: 27 U/L (ref 15–41)
Albumin: 3.9 g/dL (ref 3.5–5.0)
Alkaline Phosphatase: 64 U/L (ref 38–126)
Anion gap: 8 (ref 5–15)
BUN: 15 mg/dL (ref 8–23)
CO2: 27 mmol/L (ref 22–32)
Calcium: 9.4 mg/dL (ref 8.9–10.3)
Chloride: 102 mmol/L (ref 98–111)
Creatinine, Ser: 1.01 mg/dL — ABNORMAL HIGH (ref 0.44–1.00)
GFR, Estimated: 57 mL/min — ABNORMAL LOW (ref >60)
Glucose, Bld: 132 mg/dL — ABNORMAL HIGH (ref 70–99)
Potassium: 3.3 mmol/L — ABNORMAL LOW (ref 3.5–5.1)
Sodium: 137 mmol/L (ref 135–145)
Total Bilirubin: 0.4 mg/dL (ref 0.3–1.2)
Total Protein: 7.6 g/dL (ref 6.5–8.1)

## 2022-05-25 MED ORDER — HEPARIN SOD (PORK) LOCK FLUSH 100 UNIT/ML IV SOLN
500.0000 [IU] | Freq: Once | INTRAVENOUS | Status: AC | PRN
  Administered 2022-05-25: 500 [IU]
  Filled 2022-05-25: qty 5

## 2022-05-25 MED ORDER — DIPHENHYDRAMINE HCL 50 MG/ML IJ SOLN
50.0000 mg | Freq: Once | INTRAMUSCULAR | Status: AC
  Administered 2022-05-25: 50 mg via INTRAVENOUS
  Filled 2022-05-25: qty 1

## 2022-05-25 MED ORDER — FAMOTIDINE IN NACL 20-0.9 MG/50ML-% IV SOLN
20.0000 mg | Freq: Once | INTRAVENOUS | Status: AC
  Administered 2022-05-25: 20 mg via INTRAVENOUS
  Filled 2022-05-25: qty 50

## 2022-05-25 MED ORDER — SODIUM CHLORIDE 0.9 % IV SOLN
Freq: Once | INTRAVENOUS | Status: AC
  Filled 2022-05-25: qty 250

## 2022-05-25 MED ORDER — SODIUM CHLORIDE 0.9 % IV SOLN
10.0000 mg | Freq: Once | INTRAVENOUS | Status: AC
  Administered 2022-05-25: 10 mg via INTRAVENOUS
  Filled 2022-05-25: qty 10

## 2022-05-25 MED ORDER — SODIUM CHLORIDE 0.9 % IV SOLN
80.0000 mg/m2 | Freq: Once | INTRAVENOUS | Status: AC
  Administered 2022-05-25: 144 mg via INTRAVENOUS
  Filled 2022-05-25: qty 24

## 2022-05-25 NOTE — Patient Instructions (Signed)
[facility]  Discharge Instructions: Thank you for choosing [facility] to provide your oncology and hematology care.  If you have a lab appointment with the Cancer Center, please go directly to the Cancer Center and check in at the registration area.  Wear comfortable clothing and clothing appropriate for easy access to any Portacath or PICC line.   We strive to give you quality time with your provider. You may need to reschedule your appointment if you arrive late (15 or more minutes).  Arriving late affects you and other patients whose appointments are after yours.  Also, if you miss three or more appointments without notifying the office, you may be dismissed from the clinic at the provider's discretion.      For prescription refill requests, have your pharmacy contact our office and allow 72 hours for refills to be completed.    Today you received the following chemotherapy and/or immunotherapy agents Taxol      To help prevent nausea and vomiting after your treatment, we encourage you to take your nausea medication as directed.  BELOW ARE SYMPTOMS THAT SHOULD BE REPORTED IMMEDIATELY: *FEVER GREATER THAN 100.4 F (38 C) OR HIGHER *CHILLS OR SWEATING *NAUSEA AND VOMITING THAT IS NOT CONTROLLED WITH YOUR NAUSEA MEDICATION *UNUSUAL SHORTNESS OF BREATH *UNUSUAL BRUISING OR BLEEDING *URINARY PROBLEMS (pain or burning when urinating, or frequent urination) *BOWEL PROBLEMS (unusual diarrhea, constipation, pain near the anus) TENDERNESS IN MOUTH AND THROAT WITH OR WITHOUT PRESENCE OF ULCERS (sore throat, sores in mouth, or a toothache) UNUSUAL RASH, SWELLING OR PAIN  UNUSUAL VAGINAL DISCHARGE OR ITCHING   Items with * indicate a potential emergency and should be followed up as soon as possible or go to the Emergency Department if any problems should occur.  Please show the CHEMOTHERAPY ALERT CARD or IMMUNOTHERAPY ALERT CARD at check-in to the  Emergency Department and triage nurse.  Should you have questions after your visit or need to cancel or reschedule your appointment, please contact [facility]  [phone] and follow the prompts.  Office hours are 8:00 a.m. to 4:30 p.m. Monday - Friday. Please note that voicemails left after 4:00 p.m. may not be returned until the following business day.  We are closed weekends and major holidays. You have access to a nurse at all times for urgent questions. Please call the main number to the clinic [phone] and follow the prompts.  For any non-urgent questions, you may also contact your provider using MyChart. We now offer e-Visits for anyone 18 and older to request care online for non-urgent symptoms. For details visit [email or web address].   Also download the MyChart app! Go to the app store, search "MyChart", open the app, select [place], and log in with your MyChart username and password.  Due to Covid, a mask is required upon entering the hospital/clinic. If you do not have a mask, one will be given to you upon arrival. For doctor visits, patients may have 1 support person aged 18 or older with them. For treatment visits, patients cannot have anyone with them due to current Covid guidelines and our immunocompromised population.  

## 2022-05-26 ENCOUNTER — Other Ambulatory Visit: Payer: Self-pay

## 2022-05-26 ENCOUNTER — Emergency Department
Admission: EM | Admit: 2022-05-26 | Discharge: 2022-05-27 | Disposition: A | Discharge status: Home / Self Care | Payer: Medicare HMO | Attending: Emergency Medicine | Admitting: Emergency Medicine

## 2022-05-26 DIAGNOSIS — R0602 Shortness of breath: Secondary | ICD-10-CM | POA: Insufficient documentation

## 2022-05-26 DIAGNOSIS — R7989 Other specified abnormal findings of blood chemistry: Secondary | ICD-10-CM

## 2022-05-26 DIAGNOSIS — R531 Weakness: Secondary | ICD-10-CM | POA: Insufficient documentation

## 2022-05-26 DIAGNOSIS — Z79899 Other long term (current) drug therapy: Secondary | ICD-10-CM | POA: Diagnosis not present

## 2022-05-26 DIAGNOSIS — Y92003 Bedroom of unspecified non-institutional (private) residence as the place of occurrence of the external cause: Secondary | ICD-10-CM | POA: Diagnosis not present

## 2022-05-26 DIAGNOSIS — R5381 Other malaise: Secondary | ICD-10-CM | POA: Diagnosis not present

## 2022-05-26 DIAGNOSIS — E876 Hypokalemia: Secondary | ICD-10-CM | POA: Insufficient documentation

## 2022-05-26 DIAGNOSIS — Y907 Blood alcohol level of 200-239 mg/100 ml: Secondary | ICD-10-CM | POA: Insufficient documentation

## 2022-05-26 DIAGNOSIS — W19XXXA Unspecified fall, initial encounter: Secondary | ICD-10-CM

## 2022-05-26 DIAGNOSIS — F10129 Alcohol abuse with intoxication, unspecified: Secondary | ICD-10-CM | POA: Diagnosis not present

## 2022-05-26 DIAGNOSIS — S0083XA Contusion of other part of head, initial encounter: Secondary | ICD-10-CM | POA: Insufficient documentation

## 2022-05-26 DIAGNOSIS — E872 Acidosis, unspecified: Secondary | ICD-10-CM | POA: Insufficient documentation

## 2022-05-26 DIAGNOSIS — W06XXXA Fall from bed, initial encounter: Secondary | ICD-10-CM | POA: Diagnosis not present

## 2022-05-26 DIAGNOSIS — S0993XA Unspecified injury of face, initial encounter: Secondary | ICD-10-CM | POA: Diagnosis present

## 2022-05-26 DIAGNOSIS — F10929 Alcohol use, unspecified with intoxication, unspecified: Secondary | ICD-10-CM

## 2022-05-26 LAB — CBC WITH DIFFERENTIAL/PLATELET
Abs Immature Granulocytes: 0.04 10*3/uL (ref 0.00–0.07)
Basophils Absolute: 0 10*3/uL (ref 0.0–0.1)
Basophils Relative: 0 %
Eosinophils Absolute: 0 10*3/uL (ref 0.0–0.5)
Eosinophils Relative: 0 %
HCT: 29.6 % — ABNORMAL LOW (ref 36.0–46.0)
Hemoglobin: 9.3 g/dL — ABNORMAL LOW (ref 12.0–15.0)
Immature Granulocytes: 1 %
Lymphocytes Relative: 28 %
Lymphs Abs: 2.2 10*3/uL (ref 0.7–4.0)
MCH: 30.5 pg (ref 26.0–34.0)
MCHC: 31.4 g/dL (ref 30.0–36.0)
MCV: 97 fL (ref 80.0–100.0)
Monocytes Absolute: 0.3 10*3/uL (ref 0.1–1.0)
Monocytes Relative: 4 %
Neutro Abs: 5.3 10*3/uL (ref 1.7–7.7)
Neutrophils Relative %: 67 %
Platelets: 306 10*3/uL (ref 150–400)
RBC: 3.05 MIL/uL — ABNORMAL LOW (ref 3.87–5.11)
RDW: 15.2 % (ref 11.5–15.5)
WBC: 7.9 10*3/uL (ref 4.0–10.5)
nRBC: 0 % (ref 0.0–0.2)

## 2022-05-26 LAB — LACTIC ACID, PLASMA: Lactic Acid, Venous: 4.6 mmol/L (ref 0.5–1.9)

## 2022-05-26 LAB — COMPREHENSIVE METABOLIC PANEL
ALT: 15 U/L (ref 0–44)
AST: 30 U/L (ref 15–41)
Albumin: 3.7 g/dL (ref 3.5–5.0)
Alkaline Phosphatase: 53 U/L (ref 38–126)
Anion gap: 13 (ref 5–15)
BUN: 22 mg/dL (ref 8–23)
CO2: 22 mmol/L (ref 22–32)
Calcium: 8.7 mg/dL — ABNORMAL LOW (ref 8.9–10.3)
Chloride: 99 mmol/L (ref 98–111)
Creatinine, Ser: 0.9 mg/dL (ref 0.44–1.00)
GFR, Estimated: >60 mL/min (ref >60)
Glucose, Bld: 184 mg/dL — ABNORMAL HIGH (ref 70–99)
Potassium: 3.2 mmol/L — ABNORMAL LOW (ref 3.5–5.1)
Sodium: 134 mmol/L — ABNORMAL LOW (ref 135–145)
Total Bilirubin: 0.7 mg/dL (ref 0.3–1.2)
Total Protein: 7.5 g/dL (ref 6.5–8.1)

## 2022-05-26 LAB — CANCER ANTIGEN 27.29: CA 27.29: 171.6 U/mL — ABNORMAL HIGH (ref 0.0–38.6)

## 2022-05-26 LAB — ETHANOL: Alcohol, Ethyl (B): 206 mg/dL — ABNORMAL HIGH (ref <10)

## 2022-05-26 MED ORDER — SODIUM CHLORIDE 0.9 % IV BOLUS
500.0000 mL | Freq: Once | INTRAVENOUS | Status: AC
  Administered 2022-05-26: 500 mL via INTRAVENOUS

## 2022-05-26 NOTE — ED Triage Notes (Addendum)
Per EMS, pt found face down by family after multiple falls today. Pt had chemo yesterday and reports she normally gets weak after chemo. Port in upper right chest. Pt is dx with terminal breast and bone cancer- is full code. Pt denies LOC as does the family. Pt with swelling and abrasion to right cheek, no obvious deformity or bleeding noted. Pt AOX4, in NAD. Admits to drinking 3 beers today, does not drink on a regular basis.  BGL- 188 HR 80 154/74 97% RA

## 2022-05-26 NOTE — ED Provider Notes (Signed)
[facility] Provider Note    Event Date/Time   First MD Initiated Contact with Patient 05/26/22 2258     (approximate)   History   Fall   HPI {Remember to add pertinent medical, surgical, social, and/or OB history to HPI:1} Jessica Compton is a 81 y.o. female  ***       Physical Exam   Triage Vital Signs: ED Triage Vitals  Enc Vitals Group     BP 05/26/22 2239 (!) 154/70     Pulse Rate 05/26/22 2239 85     Resp 05/26/22 2239 16     Temp 05/26/22 2244 98.3 F (36.8 C)     Temp Source 05/26/22 2244 Oral     SpO2 05/26/22 2239 95 %     Weight --      Height --      Head Circumference --      Peak Flow --      Pain Score 05/26/22 2244 1     Pain Loc --      Pain Edu? --      Excl. in [place]? --     Most recent vital signs: Vitals:   05/26/22 2244 05/26/22 2300  BP:  127/77  Pulse:  78  Resp:  (!) 29  Temp: 98.3 F (36.8 C)   SpO2:  98%    {Only need to document appropriate and relevant physical exam:1} General: Awake, no distress. *** CV:  Good peripheral perfusion. *** Resp:  Normal effort. *** Abd:  No distention. *** Other:  ***   ED Results / Procedures / Treatments   Labs (all labs ordered are listed, but only abnormal results are displayed) Labs Reviewed  CBC WITH DIFFERENTIAL/PLATELET - Abnormal; Notable for the following components:      Result Value   RBC 3.05 (*)    Hemoglobin 9.3 (*)    HCT 29.6 (*)    All other components within normal limits  LACTIC ACID, PLASMA  LACTIC ACID, PLASMA  COMPREHENSIVE METABOLIC PANEL  ETHANOL     EKG  ***   RADIOLOGY *** {USE THE WORD "INTERPRETED"!! You MUST document your own interpretation of imaging, as well as the fact that you reviewed the radiologist's report!:1}   PROCEDURES:  Critical Care performed: {CriticalCareYesNo:19197::"Yes, see critical care procedure note(s)","No"}  Procedures   MEDICATIONS ORDERED IN ED: Medications  sodium chloride 0.9 %  bolus 500 mL (500 mLs Intravenous New Bag/Given 05/26/22 2319)     IMPRESSION / MDM / [place] / ED COURSE  I reviewed the triage vital signs and the nursing notes.                              Differential diagnosis includes, but is not limited to, ***  Patient's presentation is most consistent with {EM COPA:27473}  {If the patient is on the monitor, remove the brackets and asterisks on the sentence below and remember to document it as a Procedure as well. Otherwise delete the sentence below:1} {**The patient is on the cardiac monitor to evaluate for evidence of arrhythmia and/or significant heart rate changes.**} {Remember to include, when applicable, any/all of the following data: independent review of imaging independent review of labs (comment specifically on pertinent positives and negatives) review of specific prior hospitalizations, PCP/specialist notes, etc. discuss meds given and prescribed document any discussion with consultants (including hospitalists) any clinical decision tools you used and why (  PECARN, NEXUS, etc.) did you consider admitting the patient? document social determinants of health affecting patient's care (homelessness, inability to follow up in a timely fashion, etc) document any pre-existing conditions increasing risk on current visit (e.g. diabetes and HTN increasing danger of high-risk chest pain/ACS) describes what meds you gave (especially parenteral) and why any other interventions?:1}     FINAL CLINICAL IMPRESSION(S) / ED DIAGNOSES   Final diagnoses:  None     Rx / DC Orders   ED Discharge Orders     None        Note:  This document was prepared using Dragon voice recognition software and may include unintentional dictation errors.

## 2022-05-26 NOTE — ED Notes (Addendum)
Full rainbow sent to lab including red top, dark green, and SST.

## 2022-05-27 DIAGNOSIS — S0083XA Contusion of other part of head, initial encounter: Secondary | ICD-10-CM | POA: Diagnosis not present

## 2022-05-27 LAB — LACTIC ACID, PLASMA: Lactic Acid, Venous: 4.1 mmol/L (ref 0.5–1.9)

## 2022-05-27 MED ORDER — SODIUM CHLORIDE 0.9 % IV BOLUS
500.0000 mL | Freq: Once | INTRAVENOUS | Status: AC
  Administered 2022-05-27: 500 mL via INTRAVENOUS

## 2022-05-27 MED ORDER — POTASSIUM CHLORIDE CRYS ER 20 MEQ PO TBCR: 20.0000 meq | EXTENDED_RELEASE_TABLET | Freq: Every day | ORAL | 0 refills | Status: DC

## 2022-05-27 MED ORDER — POTASSIUM CHLORIDE CRYS ER 20 MEQ PO TBCR
40.0000 meq | EXTENDED_RELEASE_TABLET | Freq: Once | ORAL | Status: AC
  Administered 2022-05-27: 40 meq via ORAL
  Filled 2022-05-27: qty 2

## 2022-05-27 NOTE — ED Notes (Signed)
Pt discharge information reviewed. Pt understands need for follow up care and when to return if symptoms worsen. All questions answered. Pt is alert and oriented with even and regular respirations. Pt is brought out of department in wheelchair with family.  ?

## 2022-05-27 NOTE — Discharge Instructions (Addendum)
As we discussed, we believe that Jessica Compton passed out as a result of most likely not eating and drinking enough food and fluids, drinking too much alcohol, and having recently gotten chemotherapy.  Her labs are generally reassuring except for potassium being a little bit low (we prescribed supplement) and her lactic acid being elevated, but this is likely due to multiple reasons and there is no sign that she has an infection at this time.  Please encourage her to avoid alcohol, drink plenty of fluids, eat some good meals, and follow-up with Dr. Rogue Bussing at the next available opportunity.    Return to the emergency department if you develop new or worsening symptoms that concern you.

## 2022-05-31 MED FILL — Dexamethasone Sodium Phosphate Inj 100 MG/10ML: INTRAMUSCULAR | Qty: 1 | Status: AC

## 2022-06-01 ENCOUNTER — Inpatient Hospital Stay: Payer: Medicare HMO

## 2022-06-01 ENCOUNTER — Encounter: Payer: Self-pay | Admitting: Internal Medicine

## 2022-06-01 ENCOUNTER — Inpatient Hospital Stay (HOSPITAL_BASED_OUTPATIENT_CLINIC_OR_DEPARTMENT_OTHER): Payer: Medicare HMO | Admitting: Internal Medicine

## 2022-06-01 VITALS — BP 130/69 | HR 86 | Temp 97.6°F | Ht 66.0 in | Wt 147.6 lb

## 2022-06-01 DIAGNOSIS — C50812 Malignant neoplasm of overlapping sites of left female breast: Secondary | ICD-10-CM

## 2022-06-01 DIAGNOSIS — Z170 Estrogen receptor positive status [ER+]: Secondary | ICD-10-CM | POA: Diagnosis not present

## 2022-06-01 DIAGNOSIS — D649 Anemia, unspecified: Secondary | ICD-10-CM | POA: Diagnosis not present

## 2022-06-01 DIAGNOSIS — Z9221 Personal history of antineoplastic chemotherapy: Secondary | ICD-10-CM

## 2022-06-01 DIAGNOSIS — C7951 Secondary malignant neoplasm of bone: Secondary | ICD-10-CM | POA: Diagnosis not present

## 2022-06-01 DIAGNOSIS — C787 Secondary malignant neoplasm of liver and intrahepatic bile duct: Secondary | ICD-10-CM | POA: Diagnosis not present

## 2022-06-01 DIAGNOSIS — Z5111 Encounter for antineoplastic chemotherapy: Secondary | ICD-10-CM | POA: Diagnosis not present

## 2022-06-01 DIAGNOSIS — Z79899 Other long term (current) drug therapy: Secondary | ICD-10-CM | POA: Diagnosis not present

## 2022-06-01 LAB — CBC WITH DIFFERENTIAL/PLATELET
Abs Immature Granulocytes: 0.04 10*3/uL (ref 0.00–0.07)
Basophils Absolute: 0 10*3/uL (ref 0.0–0.1)
Basophils Relative: 1 %
Eosinophils Absolute: 0.1 10*3/uL (ref 0.0–0.5)
Eosinophils Relative: 3 %
HCT: 29.6 % — ABNORMAL LOW (ref 36.0–46.0)
Hemoglobin: 9.4 g/dL — ABNORMAL LOW (ref 12.0–15.0)
Immature Granulocytes: 1 %
Lymphocytes Relative: 49 %
Lymphs Abs: 1.7 10*3/uL (ref 0.7–4.0)
MCH: 30.9 pg (ref 26.0–34.0)
MCHC: 31.8 g/dL (ref 30.0–36.0)
MCV: 97.4 fL (ref 80.0–100.0)
Monocytes Absolute: 0.2 10*3/uL (ref 0.1–1.0)
Monocytes Relative: 6 %
Neutro Abs: 1.3 10*3/uL — ABNORMAL LOW (ref 1.7–7.7)
Neutrophils Relative %: 40 %
Platelets: 326 10*3/uL (ref 150–400)
RBC: 3.04 MIL/uL — ABNORMAL LOW (ref 3.87–5.11)
RDW: 15.5 % (ref 11.5–15.5)
WBC: 3.4 10*3/uL — ABNORMAL LOW (ref 4.0–10.5)
nRBC: 0.6 % — ABNORMAL HIGH (ref 0.0–0.2)

## 2022-06-01 LAB — COMPREHENSIVE METABOLIC PANEL
ALT: 17 U/L (ref 0–44)
AST: 33 U/L (ref 15–41)
Albumin: 3.9 g/dL (ref 3.5–5.0)
Alkaline Phosphatase: 52 U/L (ref 38–126)
Anion gap: 11 (ref 5–15)
BUN: 12 mg/dL (ref 8–23)
CO2: 24 mmol/L (ref 22–32)
Calcium: 9.1 mg/dL (ref 8.9–10.3)
Chloride: 101 mmol/L (ref 98–111)
Creatinine, Ser: 1.02 mg/dL — ABNORMAL HIGH (ref 0.44–1.00)
GFR, Estimated: 56 mL/min — ABNORMAL LOW (ref >60)
Glucose, Bld: 215 mg/dL — ABNORMAL HIGH (ref 70–99)
Potassium: 3.6 mmol/L (ref 3.5–5.1)
Sodium: 136 mmol/L (ref 135–145)
Total Bilirubin: 0.6 mg/dL (ref 0.3–1.2)
Total Protein: 7.1 g/dL (ref 6.5–8.1)

## 2022-06-01 LAB — VITAMIN D 25 HYDROXY (VIT D DEFICIENCY, FRACTURES): Vit D, 25-Hydroxy: 16.18 ng/mL — ABNORMAL LOW (ref 30–100)

## 2022-06-01 MED ORDER — HEPARIN SOD (PORK) LOCK FLUSH 100 UNIT/ML IV SOLN
500.0000 [IU] | Freq: Once | INTRAVENOUS | Status: AC
  Administered 2022-06-01: 500 [IU] via INTRAVENOUS
  Filled 2022-06-01: qty 5

## 2022-06-01 NOTE — Progress Notes (Signed)
[facility] OFFICE PROGRESS NOTE  Patient Care Team: Pcp, No as PCP - General Byrnett, Forest Gleason, MD (General Surgery) Marden Noble, MD (Internal Medicine) [name], MD as Consulting Physician (Internal Medicine)   SUMMARY OF ONCOLOGIC HISTORY:  Oncology History Overview Note  # 2007- LEFT BREAST CA STAGE II [T2N37m; s/p mastec; Dr.Byrnett] ER-Pos/PR Neg; Her- 2 Neu- NEG; ACx4-Taxol x12; Femara; stopped May 2009-stopped follow up.  #JAN 2015- Post mastectomy Chest wall recurrence [chest wall mass bx-]; ER- 90%; PR- NEG; Her2 Neu- NEG; s/p excision [involving skeletal muscle/adipose tissue; clear margins]; CT July 2017- NED;   # Jan 2020- progression/ hilar- START abema [jan 14th]+ faslodex[jan 13th]; OCT-NOV 2022-PET scan shows progressive disease in the bones to liver.  Discontinue Abema+ Faslodex  # DEC 1st, 2022- START Xeloda 2w/1w; MARCH 10th, 2023-progressive disease-lung bone liver  # end of March 2023- taxol weekly  # DEC 2021- anemia/? CKD [stool ];#Ascending colon mass-incidentally noted on PET scan.  S/p  Dr. AVicente Males  colonoscpy DEC 2022- NEGATIVE; reviewed small adenomas.   # OSTEOPENIA [BMD- June 2016]  # Kidney cysts  DIAGNOSIS: LEFT BREAST CA  STAGE: IV- NED  ;GOALS: pallaitive      Bre[place] cancer [place]as[place] to skin ([place]  02/05/2014 Initial Diagnosis   Breast cancer metastasized to skin ([facility]   Malignant neoplasm of overlapping sites of left breast in female, estrogen receptor positive ([place]  07/24/2016 Initial Diagnosis   Cancer of overlapping sites of left female breast ([place]   01/06/2019 - 08/05/2020 Chemotherapy   Patient is on Treatment Plan : BREAST Abemaciclib + Fulvestrant q28d     03/22/2022 -  Chemotherapy   Patient is on Treatment Plan : BREAST Paclitaxel D1,8,15 q28d      INTERVAL HISTORY: Accompanied by  daughter; Ambulating independently.  A very pleasant 81 year old African-American female patient with above  history of ER/PR positive breast cancer stage IV currently on Taxol single agent is here for follow-up.  In the interim patient was a evaluated in the emergency room post fall.  No loss of consciousness; lost balance.  Head CT was not done.  Chest x-ray overall stable.  Of note patient had chemotherapy the day prior to the fall.  She also had 3 beers.  Concerns of numbness of the legs.  No headaches.  Patient continues to complain of moderate fatigue.   No nausea no vomiting.  No fever no chills.  No rash on palms and soles.  Chronic joint pains.  Not worse.  No sores in the mouth.  No diarrhea.  Review of Systems  Constitutional:  Positive for malaise/fatigue. Negative for chills, diaphoresis, fever and weight loss.  HENT:  Negative for nosebleeds and sore throat.   Eyes:  Negative for double vision.  Respiratory:  Negative for cough, hemoptysis, sputum production, shortness of breath and wheezing.   Cardiovascular:  Negative for chest pain, palpitations, orthopnea and leg swelling.  Gastrointestinal:  Negative for abdominal pain, blood in stool, constipation, diarrhea, heartburn, melena, nausea and vomiting.  Musculoskeletal:  Positive for back pain and joint pain.  Skin: Negative.  Negative for itching and rash.  Neurological:  Negative for tingling, focal weakness, weakness and headaches.  Endo/Heme/Allergies:  Does not bruise/bleed easily.  Psychiatric/Behavioral:  Negative for depression. The patient is not nervous/anxious and does not have insomnia.    PAST MEDICAL HISTORY :  Past Medical History:  Diagnosis Date   Aneurysm ([place] 2007   Arthritis    Cancer (  [place]) 2007   diagnosed 01/07 left breast with simple mastectomu & sn bx. The pt had a 3.5cm tumor. Frozen section report on the 2 sn were negative. On permanent sections a 0.72m micrometastasis was identified. She was not felt to require a complete axillary dissection. She has completed her Tamoxifen therapy and has been released  from routine f/u with medical oncology service.   COPD (chronic obstructive pulmonary disease) (HCC)    Diabetes mellitus without complication ([facility]    Hyperlipidemia    Hypertension    since age 89  Malignant neoplasm of upper-outer quadrant of female breast ([facility] 12/2013   Mastectomy site recurrence resected 01/26/2014, ER 90%, PR 0%, HER-2/neu nonamplified.   Other benign neoplasm of connective and other soft tissue of trunk, unspecified 2014   Personal history of colonic polyps    Personal history of tobacco use, presenting hazards to health    Special screening for malignant neoplasms, colon    Unspecified disorder of skin and subcutaneous tissue 02/19/2013   biopsy of skin over the sternum on the right breast showed hypertrophic scar,keloid formation.    PAST SURGICAL HISTORY :   Past Surgical History:  Procedure Laterality Date   ABDOMINAL HYSTERECTOMY  2004   total   BREAST BIOPSY Right January 26, 2014   Core biopsy for mild increase breast uptake on PET scan, usual ductal hyperplasia and stromal fibrosis   BREAST SURGERY Left 2007   mastectomy   chest wall mass  01/26/14   CHOLECYSTECTOMY  2007   COLONOSCOPY W/ POLYPECTOMY  2011   Dr. EBrynda Greathouse  COLONOSCOPY WITH PROPOFOL N/A 12/22/2021   Procedure: COLONOSCOPY WITH PROPOFOL;  Surgeon: AJonathon Bellows MD;  [facility];  Service: Gastroenterology;  Laterality: N/A;   EYE SURGERY Right    cataract surgery   head surgery  1991   IR IMAGING GUIDED PORT INSERTION  03/13/2022   MASTECTOMY Left 2007   PORT-A-CATH REMOVAL  2008   PORTACATH PLACEMENT  2007    FAMILY HISTORY :   Family History  Problem Relation Age of Onset   Cancer Other        ovarian,breast,colon cancers;relations not listed   Breast cancer Neg Hx     SOCIAL HISTORY:   Social History   Tobacco Use   Smoking status: Some Days    Packs/day: 1.00    Years: 50.00    Total pack years: 50.00    Types: Cigarettes   Smokeless tobacco: Never   Vaping Use   Vaping Use: Some days  Substance Use Topics   Alcohol use: No    Alcohol/week: 0.0 standard drinks of alcohol   Drug use: No    ALLERGIES:  is allergic to metoprolol.  MEDICATIONS:  Current Outpatient Medications  Medication Sig Dispense Refill   amLODipine (NORVASC) 10 MG tablet TAKE 1 TABLET(10 MG) BY MOUTH DAILY 90 tablet 1   atorvastatin (LIPITOR) 20 MG tablet Take 20 mg by mouth daily at 6 PM.     citalopram (CELEXA) 40 MG tablet TAKE 1 TABLET(40 MG) BY MOUTH DAILY 90 tablet 1   lidocaine-prilocaine (EMLA) cream Apply on the port. 30 -45 min  prior to port access. 30 g 3   lisinopril (ZESTRIL) 2.5 MG tablet Take 1 tablet (2.5 mg total) by mouth daily. 90 tablet 1   meloxicam (MOBIC) 15 MG tablet TAKE 1 TABLET(15 MG) BY MOUTH DAILY 90 tablet 1   methocarbamol (ROBAXIN) 500 MG tablet Take 1 tablet (500 mg  total) by mouth in the morning and at bedtime. 60 tablet 1   ondansetron (ZOFRAN) 8 MG tablet One pill every 8 hours as needed for nausea/vomitting. 40 tablet 1   prochlorperazine (COMPAZINE) 10 MG tablet TAKE 1 TABLET(10 MG) BY MOUTH EVERY 6 HOURS AS NEEDED FOR NAUSEA OR VOMITING 40 tablet 1   No current facility-administered medications for this visit.    PHYSICAL EXAMINATION: ECOG PERFORMANCE STATUS: 0 - Asymptomatic  BP 130/69 (BP Location: Right Arm, Patient Position: Sitting, Cuff Size: Normal)   Pulse 86   Temp 97.6 F (36.4 C) (Tympanic)   Ht 5' 6"  (1.676 m)   Wt 147 lb 9.6 oz (67 kg)   SpO2 100%   BMI 23.82 kg/m   Filed Weights   06/01/22 0817  Weight: 147 lb 9.6 oz (67 kg)      Physical Exam HENT:     Head: Normocephalic and atraumatic.     Mouth/Throat:     Pharynx: No oropharyngeal exudate.  Eyes:     Pupils: Pupils are equal, round, and reactive to light.  Cardiovascular:     Rate and Rhythm: Normal rate and regular rhythm.  Pulmonary:     Effort: No respiratory distress.     Breath sounds: No wheezing.  Abdominal:      General: Bowel sounds are normal. There is no distension.     Palpations: Abdomen is soft. There is no mass.     Tenderness: There is no abdominal tenderness. There is no guarding or rebound.  Musculoskeletal:        General: No tenderness. Normal range of motion.     Cervical back: Normal range of motion and neck supple.  Skin:    General: Skin is warm.  Neurological:     Mental Status: She is alert and oriented to person, place, and time.  Psychiatric:        Mood and Affect: Affect normal.    LABORATORY DATA:  I have reviewed the data as listed    Component Value Date/Time   NA 136 06/01/2022 0822   NA 139 05/30/2014 0127   K 3.6 06/01/2022 0822   K 3.7 05/30/2014 0127   CL 101 06/01/2022 0822   CL 106 05/30/2014 0127   CO2 24 06/01/2022 0822   CO2 25 05/30/2014 0127   GLUCOSE 215 (H) 06/01/2022 0822   GLUCOSE 136 (H) 05/30/2014 0127   BUN 12 06/01/2022 0822   BUN 19 (H) 05/30/2014 0127   CREATININE 1.02 (H) 06/01/2022 0822   CREATININE 0.98 05/30/2014 0127   CALCIUM 9.1 06/01/2022 0822   CALCIUM 9.0 05/30/2014 0127   PROT 7.1 06/01/2022 0822   PROT 7.8 05/08/2014 1131   ALBUMIN 3.9 06/01/2022 0822   ALBUMIN 3.7 05/08/2014 1131   AST 33 06/01/2022 0822   AST 20 05/08/2014 1131   ALT 17 06/01/2022 0822   ALT 18 05/08/2014 1131   ALKPHOS 52 06/01/2022 0822   ALKPHOS 58 05/08/2014 1131   BILITOT 0.6 06/01/2022 0822   BILITOT 0.4 05/08/2014 1131   GFRNONAA 56 (L) 06/01/2022 0822   GFRNONAA 58 (L) 05/30/2014 0127   GFRAA 47 (L) 09/02/2020 0920   GFRAA >60 05/30/2014 0127    No results found for: "SPEP", "UPEP"  Lab Results  Component Value Date   WBC 3.4 (L) 06/01/2022   NEUTROABS 1.3 (L) 06/01/2022   HGB 9.4 (L) 06/01/2022   HCT 29.6 (L) 06/01/2022   MCV 97.4 06/01/2022   PLT 326 06/01/2022  Chemistry      Component Value Date/Time   NA 136 06/01/2022 0822   NA 139 05/30/2014 0127   K 3.6 06/01/2022 0822   K 3.7 05/30/2014 0127   CL 101  06/01/2022 0822   CL 106 05/30/2014 0127   CO2 24 06/01/2022 0822   CO2 25 05/30/2014 0127   BUN 12 06/01/2022 0822   BUN 19 (H) 05/30/2014 0127   CREATININE 1.02 (H) 06/01/2022 0822   CREATININE 0.98 05/30/2014 0127      Component Value Date/Time   CALCIUM 9.1 06/01/2022 0822   CALCIUM 9.0 05/30/2014 0127   ALKPHOS 52 06/01/2022 0822   ALKPHOS 58 05/08/2014 1131   AST 33 06/01/2022 0822   AST 20 05/08/2014 1131   ALT 17 06/01/2022 0822   ALT 18 05/08/2014 1131   BILITOT 0.6 06/01/2022 0822   BILITOT 0.4 05/08/2014 1131         RADIOGRAPHIC STUDIES: I have personally reviewed the radiological images as listed and agreed with the findings in the report. No results found.   ASSESSMENT & PLAN:   Malignant neoplasm of overlapping sites of left breast in female, estrogen receptor positive ([place]) #Left breast ipsilateral chest wall recurrence stage IV [2015]-bone/liver metastases/lung nodules ;  CT scan March 10th, 2023-shows interval enlargement of subpleural nodules of the medial right lower lobe, consistent with worsened metastatic disease. Interval enlargement of a subtly hypodense lesion of the anterior left lobe of the liver, consistent with worsened hepatic metastatic Disease. Extensive new and increased osseous sclerosis involving the vertebral bodies, ribs, and bony pelvis, consistent with post treatment sclerosis of osseous metastatic disease.  Currently on Taxol weekly.  Clinically stable. check with pathology if they have the-ER/PR HER2/neu testing results from breast pathology- in 2007.   # HOLD Taxol weekly cycle #3 day-15 today-given the fall/neuropathy [see below].  We will repeat imaging CT scan chest and pelvis in 2 weeks.  Also get 2D echo.  # Fall-appears mechanical/neuropathy.  Consider brain imaging given history of metastatic disease.   # PN-2-3 sec to Taxol- monitor for now; hold chemotherapy.  Consider Neurontin.  # Hypocalcemia-continue calcium plus vitamin  D twice daily; awaiting vitamin D levels  # Bilateral hip pain- arthritis-[s/p ortho evaluation]; on meloxicam/robaxin- refilled.   STABLE.   # HTN- continue amlodipine/Lisinopril-systolic blood pressure 956L.  Monitor closely.  STABLE.   # Bone lesions: discussed zometa. Recommend ca [500]+vit D [1000 IU]. On Zometa q 4 weeks;   # Mild anemia hemoglobin- 11-12/ CKD- STABLE.       # CKD- stage III- GFR 48-50- STABLE.   # Anxiety/depression- Celexa; recommend social worker referral..   ? Zometa q4 w; cycle#3-d-8 # DISPOSITION:  # social worker referral re: anxiety/difficulty coping  # 2 d echo ASAP  # HOLD chemo today; de-access.   # follow up in 2 weeks--; MD; cbc/cmp;CEA/ca27-29;  Taxol; CT CAP prior- Dr.B          [name], MD 06/01/2022 9:50 AM

## 2022-06-01 NOTE — Assessment & Plan Note (Addendum)
#  Left breast ipsilateral chest wall recurrence stage IV [2015]-bone/liver metastases/lung nodules ;  CT scan March 10th, 2023-shows interval enlargement of subpleural nodules of the medial right lower lobe, consistent with worsened metastatic disease. Interval enlargement of a subtly hypodense lesion of the anterior left lobe of the liver, consistent with worsened hepatic metastatic Disease. Extensive new and increased osseous sclerosis involving the vertebral bodies, ribs, and bony pelvis, consistent with post treatment sclerosis of osseous metastatic disease.  Currently on Taxol weekly.  Clinically stable. check with pathology if they have the-ER/PR HER2/neu testing results from breast pathology- in 2007.   # HOLD Taxol weekly cycle #3 day-15 today-given the fall/neuropathy [see below].  We will repeat imaging CT scan chest and pelvis in 2 weeks.  Also get 2D echo.  # Fall-appears mechanical/neuropathy.  Consider brain imaging given history of metastatic disease.   # PN-2-3 sec to Taxol- monitor for now; hold chemotherapy.  Consider Neurontin.  # Hypocalcemia-continue calcium plus vitamin D twice daily; awaiting vitamin D levels  # Bilateral hip pain- arthritis-[s/p ortho evaluation]; on meloxicam/robaxin- refilled.   STABLE.   # HTN- continue amlodipine/Lisinopril-systolic blood pressure 355H.  Monitor closely.  STABLE.   # Bone lesions: discussed zometa. Recommend ca [500]+vit D [1000 IU]. On Zometa q 4 weeks;   # Mild anemia hemoglobin- 11-12/ CKD- STABLE.       # CKD- stage III- GFR 48-50- STABLE.   # Anxiety/depression- Celexa; recommend social worker referral..   ? Zometa q4 w; cycle#3-d-8 # DISPOSITION:  # social worker referral re: anxiety/difficulty coping  # 2 d echo ASAP  # HOLD chemo today; de-access.   # follow up in 2 weeks--; MD; cbc/cmp;CEA/ca27-29;  Taxol; CT CAP prior- Dr.B

## 2022-06-01 NOTE — Progress Notes (Signed)
[place] last Friday, has lesion under right eye.  C/o worsening sob.  ED gave her potassium but hasn't started, wanted to run it by you first.

## 2022-06-01 NOTE — Addendum Note (Signed)
Addended by: Vanice Sarah on: 06/01/2022 12:06 PM   Modules accepted: Orders

## 2022-06-02 ENCOUNTER — Other Ambulatory Visit
Admission: RE | Admit: 2022-06-02 | Discharge: 2022-06-02 | Disposition: A | Discharge status: Home / Self Care | Payer: Medicare HMO | Source: Ambulatory Visit | Attending: Internal Medicine | Admitting: Internal Medicine

## 2022-06-02 ENCOUNTER — Ambulatory Visit
Admission: RE | Admit: 2022-06-02 | Discharge: 2022-06-02 | Disposition: A | Discharge status: Home / Self Care | Payer: Medicare HMO | Source: Ambulatory Visit | Attending: Internal Medicine | Admitting: Internal Medicine

## 2022-06-02 DIAGNOSIS — I340 Nonrheumatic mitral (valve) insufficiency: Secondary | ICD-10-CM | POA: Diagnosis not present

## 2022-06-02 DIAGNOSIS — J449 Chronic obstructive pulmonary disease, unspecified: Secondary | ICD-10-CM | POA: Insufficient documentation

## 2022-06-02 DIAGNOSIS — I080 Rheumatic disorders of both mitral and aortic valves: Secondary | ICD-10-CM | POA: Diagnosis not present

## 2022-06-02 DIAGNOSIS — Z170 Estrogen receptor positive status [ER+]: Secondary | ICD-10-CM | POA: Insufficient documentation

## 2022-06-02 DIAGNOSIS — E119 Type 2 diabetes mellitus without complications: Secondary | ICD-10-CM | POA: Diagnosis present

## 2022-06-02 DIAGNOSIS — I119 Hypertensive heart disease without heart failure: Secondary | ICD-10-CM | POA: Diagnosis not present

## 2022-06-02 DIAGNOSIS — Z9012 Acquired absence of left breast and nipple: Secondary | ICD-10-CM | POA: Insufficient documentation

## 2022-06-02 DIAGNOSIS — C50812 Malignant neoplasm of overlapping sites of left female breast: Secondary | ICD-10-CM | POA: Insufficient documentation

## 2022-06-02 DIAGNOSIS — I10 Essential (primary) hypertension: Secondary | ICD-10-CM | POA: Diagnosis not present

## 2022-06-02 DIAGNOSIS — Z9221 Personal history of antineoplastic chemotherapy: Secondary | ICD-10-CM | POA: Diagnosis not present

## 2022-06-02 LAB — ECHOCARDIOGRAM COMPLETE
AR max vel: 2.38 cm2
AV Area VTI: 2.96 cm2
AV Area mean vel: 2.36 cm2
AV Mean grad: 2.5 mmHg
AV Peak grad: 4.9 mmHg
Ao pk vel: 1.11 m/s
Area-P 1/2: 3.31 cm2
MV VTI: 2.92 cm2
S' Lateral: 1.7 cm

## 2022-06-02 LAB — CANCER ANTIGEN 27.29: CA 27.29: 164.3 U/mL — ABNORMAL HIGH (ref 0.0–38.6)

## 2022-06-02 LAB — CEA: CEA: 9.8 ng/mL — ABNORMAL HIGH (ref 0.0–4.7)

## 2022-06-02 NOTE — Progress Notes (Signed)
*  PRELIMINARY RESULTS* Echocardiogram 2D Echocardiogram has been performed.  Sherrie Sport 06/02/2022, 10:34 AM

## 2022-06-03 ENCOUNTER — Encounter: Payer: Self-pay | Admitting: Internal Medicine

## 2022-06-03 LAB — CEA: CEA: 10.2 ng/mL — ABNORMAL HIGH (ref 0.0–4.7)

## 2022-06-03 NOTE — Progress Notes (Signed)
I spoke to patient's daughter Helene Kelp to check on the patient since the last visit.  Patient emotionally more stable.  Discussed regarding the tumor markers.  We will plan to follow-up with imaging to finalize treatment plan. GB

## 2022-06-05 ENCOUNTER — Encounter: Payer: Self-pay | Admitting: Licensed Clinical Social Worker

## 2022-06-05 NOTE — Progress Notes (Signed)
[facility] Work  Initial Assessment   Jessica Compton is a 81 y.o. year old female contacted by phone. Clinical Social Work was referred by medical provider for assessment of psychosocial needs.   SDOH (Social Determinants of Health) assessments performed: Yes SDOH Interventions    Flowsheet Row Most Recent Value  SDOH Interventions   Food Insecurity Interventions Intervention Not Indicated  Financial Strain Interventions Intervention Not Indicated  Housing Interventions Intervention Not Indicated  Physical Activity Interventions Intervention Not Indicated, Other (Comments)  [Patient does not have current needs]  Stress Interventions Intervention Not Indicated  Social Connections Interventions Intervention Not Indicated  Transportation Interventions Intervention Not Indicated, Patient Resources (Friends/Family)       SDOH Screenings   Alcohol Screen: Low Risk  (06/05/2022)   Alcohol Screen    Last Alcohol Screening Score (AUDIT): 0  Depression (PHQ2-9): Low Risk  (06/05/2022)   Depression (PHQ2-9)    PHQ-2 Score: 0  Financial Resource Strain: Low Risk  (06/05/2022)   Overall Financial Resource Strain (CARDIA)    Difficulty of Paying Living Expenses: Not hard at all  Food Insecurity: No Food Insecurity (06/05/2022)   Hunger Vital Sign    Worried About Running Out of Food in the Last Year: Never true    [place] in the Last Year: Never true  Housing: Low Risk  (06/05/2022)   Housing    Last Housing Risk Score: 0  Physical Activity: Inactive (06/05/2022)   Exercise Vital Sign    Days of Exercise per Week: 0 days    Minutes of Exercise per Session: 0 min  Social Connections: Moderately Isolated (06/05/2022)   Social Connection and Isolation Panel [NHANES]    Frequency of Communication with Friends and Family: Three times a week    Frequency of Social Gatherings with Friends and Family: Three times a week    Attends Religious Services: 1 to 4 times per year     Active Member of Clubs or Organizations: No    Attends Archivist Meetings: Never    Marital Status: Never married  Stress: No Stress Concern Present (06/05/2022)   [employer] [place]    Feeling of Stress : Only a little  Tobacco Use: High Risk (06/01/2022)   Patient History    Smoking Tobacco Use: Some Days    Smokeless Tobacco Use: Never    Passive Exposure: Not on file  Transportation Needs: No Transportation Needs (06/05/2022)   PRAPARE - Transportation    Lack of Transportation (Medical): No    Lack of Transportation (Non-Medical): No     Distress Screen completed: No     No data to display            Family/Social Information:  Housing Arrangement: patient lives with son. . Family members/support persons in your life? Family, Friends, and Geographical information systems officer concerns: no  Employment: Retired .  Income source: Paediatric nurse concerns: No Type of concern: None Food access concerns: no Religious or spiritual practice: Not known Services Currently in place:  [employer] and Medicaid  Coping/ Adjustment to diagnosis: Patient understands treatment plan and what happens next? yes Concerns about diagnosis and/or treatment: I'm not especially worried about anything Patient reported stressors:  Patient stated her only concern is her fear of falling when she is walking around the house.  Patient has walker and cane, but state she rarely uses it. Patient stated is not interested in community groups  or attending day centers. Hopes and/or priorities: N/A Patient enjoys watching TV and time with family/ friends Current coping skills/ strengths: Active sense of humor , Capable of independent living , Financial means , Motivation for treatment/growth , and Supportive family/friends     SUMMARY: Current SDOH Barriers:  Patient is mildly isolated, no other SDOH concerns identified.  Patient stated is not interested in community groups or attending day centers  Clinical Social Work Clinical Goal(s):  No clinical social work goals at this time  Interventions: Discussed common feeling and emotions when being diagnosed with cancer, and the importance of support during treatment Informed patient of the support team roles and support services at [facility] Provided CSW contact information and encouraged patient to call with any questions or concerns Provided patient with information about CSW role in patient care and available resource. Patient stated she has not concerns or needs at this time.   Follow Up Plan: Patient will contact CSW with any support or resource needs Patient verbalizes understanding of plan: No    Keelia Graybill, LCSW

## 2022-06-09 ENCOUNTER — Ambulatory Visit
Admission: RE | Admit: 2022-06-09 | Discharge: 2022-06-09 | Disposition: A | Discharge status: Home / Self Care | Payer: Medicare HMO | Source: Ambulatory Visit | Attending: Internal Medicine | Admitting: Internal Medicine

## 2022-06-09 DIAGNOSIS — K7689 Other specified diseases of liver: Secondary | ICD-10-CM | POA: Diagnosis not present

## 2022-06-09 DIAGNOSIS — J941 Fibrothorax: Secondary | ICD-10-CM | POA: Diagnosis not present

## 2022-06-09 DIAGNOSIS — N1330 Unspecified hydronephrosis: Secondary | ICD-10-CM | POA: Diagnosis not present

## 2022-06-09 DIAGNOSIS — Z170 Estrogen receptor positive status [ER+]: Secondary | ICD-10-CM | POA: Diagnosis not present

## 2022-06-09 DIAGNOSIS — Q625 Duplication of ureter: Secondary | ICD-10-CM | POA: Diagnosis not present

## 2022-06-09 DIAGNOSIS — C50812 Malignant neoplasm of overlapping sites of left female breast: Secondary | ICD-10-CM | POA: Diagnosis not present

## 2022-06-09 DIAGNOSIS — C50912 Malignant neoplasm of unspecified site of left female breast: Secondary | ICD-10-CM | POA: Diagnosis not present

## 2022-06-09 DIAGNOSIS — J701 Chronic and other pulmonary manifestations due to radiation: Secondary | ICD-10-CM | POA: Diagnosis not present

## 2022-06-09 DIAGNOSIS — J432 Centrilobular emphysema: Secondary | ICD-10-CM | POA: Diagnosis not present

## 2022-06-09 DIAGNOSIS — N1370 Vesicoureteral-reflux, unspecified: Secondary | ICD-10-CM | POA: Diagnosis not present

## 2022-06-09 MED ORDER — IOHEXOL 300 MG/ML  SOLN
80.0000 mL | Freq: Once | INTRAMUSCULAR | Status: AC | PRN
  Administered 2022-06-09: 80 mL via INTRAVENOUS

## 2022-06-14 MED FILL — Dexamethasone Sodium Phosphate Inj 100 MG/10ML: INTRAMUSCULAR | Qty: 1 | Status: AC

## 2022-06-15 ENCOUNTER — Inpatient Hospital Stay: Payer: Medicare HMO

## 2022-06-15 ENCOUNTER — Inpatient Hospital Stay (HOSPITAL_BASED_OUTPATIENT_CLINIC_OR_DEPARTMENT_OTHER): Payer: Medicare HMO | Admitting: Internal Medicine

## 2022-06-15 DIAGNOSIS — Z170 Estrogen receptor positive status [ER+]: Secondary | ICD-10-CM | POA: Diagnosis not present

## 2022-06-15 DIAGNOSIS — C50812 Malignant neoplasm of overlapping sites of left female breast: Secondary | ICD-10-CM | POA: Diagnosis not present

## 2022-06-15 DIAGNOSIS — C787 Secondary malignant neoplasm of liver and intrahepatic bile duct: Secondary | ICD-10-CM | POA: Diagnosis not present

## 2022-06-15 DIAGNOSIS — Z5111 Encounter for antineoplastic chemotherapy: Secondary | ICD-10-CM | POA: Diagnosis not present

## 2022-06-15 DIAGNOSIS — C7951 Secondary malignant neoplasm of bone: Secondary | ICD-10-CM | POA: Diagnosis not present

## 2022-06-15 DIAGNOSIS — Z79899 Other long term (current) drug therapy: Secondary | ICD-10-CM | POA: Diagnosis not present

## 2022-06-15 DIAGNOSIS — D649 Anemia, unspecified: Secondary | ICD-10-CM | POA: Diagnosis not present

## 2022-06-15 LAB — CBC WITH DIFFERENTIAL/PLATELET
Abs Immature Granulocytes: 0.05 10*3/uL (ref 0.00–0.07)
Basophils Absolute: 0 10*3/uL (ref 0.0–0.1)
Basophils Relative: 0 %
Eosinophils Absolute: 0.1 10*3/uL (ref 0.0–0.5)
Eosinophils Relative: 1 %
HCT: 32.8 % — ABNORMAL LOW (ref 36.0–46.0)
Hemoglobin: 10.4 g/dL — ABNORMAL LOW (ref 12.0–15.0)
Immature Granulocytes: 1 %
Lymphocytes Relative: 20 %
Lymphs Abs: 2 10*3/uL (ref 0.7–4.0)
MCH: 29.8 pg (ref 26.0–34.0)
MCHC: 31.7 g/dL (ref 30.0–36.0)
MCV: 94 fL (ref 80.0–100.0)
Monocytes Absolute: 1.1 10*3/uL — ABNORMAL HIGH (ref 0.1–1.0)
Monocytes Relative: 10 %
Neutro Abs: 7.2 10*3/uL (ref 1.7–7.7)
Neutrophils Relative %: 68 %
Platelets: 251 10*3/uL (ref 150–400)
RBC: 3.49 MIL/uL — ABNORMAL LOW (ref 3.87–5.11)
RDW: 15.4 % (ref 11.5–15.5)
WBC: 10.4 10*3/uL (ref 4.0–10.5)
nRBC: 0 % (ref 0.0–0.2)

## 2022-06-15 LAB — COMPREHENSIVE METABOLIC PANEL
ALT: 15 U/L (ref 0–44)
AST: 35 U/L (ref 15–41)
Albumin: 3.7 g/dL (ref 3.5–5.0)
Alkaline Phosphatase: 73 U/L (ref 38–126)
Anion gap: 10 (ref 5–15)
BUN: 15 mg/dL (ref 8–23)
CO2: 24 mmol/L (ref 22–32)
Calcium: 8.9 mg/dL (ref 8.9–10.3)
Chloride: 101 mmol/L (ref 98–111)
Creatinine, Ser: 1.11 mg/dL — ABNORMAL HIGH (ref 0.44–1.00)
GFR, Estimated: 51 mL/min — ABNORMAL LOW (ref >60)
Glucose, Bld: 141 mg/dL — ABNORMAL HIGH (ref 70–99)
Potassium: 3.4 mmol/L — ABNORMAL LOW (ref 3.5–5.1)
Sodium: 135 mmol/L (ref 135–145)
Total Bilirubin: 0.8 mg/dL (ref 0.3–1.2)
Total Protein: 7.7 g/dL (ref 6.5–8.1)

## 2022-06-15 MED ORDER — HEPARIN SOD (PORK) LOCK FLUSH 100 UNIT/ML IV SOLN
INTRAVENOUS | Status: AC
  Filled 2022-06-15: qty 5

## 2022-06-15 MED ORDER — ERGOCALCIFEROL 1.25 MG (50000 UT) PO CAPS: 50000.0000 [IU] | ORAL_CAPSULE | ORAL | 1 refills | Status: AC

## 2022-06-15 MED ORDER — HEPARIN SOD (PORK) LOCK FLUSH 100 UNIT/ML IV SOLN
500.0000 [IU] | Freq: Once | INTRAVENOUS | Status: AC
  Administered 2022-06-15: 500 [IU] via INTRAVENOUS
  Filled 2022-06-15: qty 5

## 2022-06-15 MED ORDER — SODIUM CHLORIDE 0.9% FLUSH
10.0000 mL | INTRAVENOUS | Status: DC | PRN
  Administered 2022-06-15: 10 mL via INTRAVENOUS
  Filled 2022-06-15: qty 10

## 2022-06-15 NOTE — Progress Notes (Unsigned)
[place] OFFICE PROGRESS NOTE  Patient Care Team: Pcp, No as PCP - General Byrnett, Forest Gleason, MD (General Surgery) Marden Noble, MD (Internal Medicine) [name], MD as Consulting Physician (Internal Medicine)   SUMMARY OF ONCOLOGIC HISTORY:  Oncology History Overview Note  # 2007- LEFT BREAST CA STAGE II [T2N60m; s/p mastec; Dr.Byrnett] ER-Pos/PR Neg; Her- 2 Neu- NEG; ACx4-Taxol x12; Femara; stopped May 2009-stopped follow up.  #JAN 2015- Post mastectomy Chest wall recurrence [chest wall mass bx-]; ER- 90%; PR- NEG; Her2 Neu- NEG; s/p excision [involving skeletal muscle/adipose tissue; clear margins]; CT July 2017- NED;   # Jan 2020- progression/ hilar- START abema [jan 14th]+ faslodex[jan 13th]; OCT-NOV 2022-PET scan shows progressive disease in the bones to liver.  Discontinue Abema+ Faslodex  # DEC 1st, 2022- START Xeloda 2w/1w; MARCH 10th, 2023-progressive disease-lung bone liver  # end of March 2023- taxol weekly  # DEC 2021- anemia/? CKD [stool ];#Ascending colon mass-incidentally noted on PET scan.  S/p  Dr. AVicente Males  colonoscpy DEC 2022- NEGATIVE; reviewed small adenomas.   # OSTEOPENIA [BMD- June 2016]  # Kidney cysts  DIAGNOSIS: LEFT BREAST CA  STAGE: IV- NED  ;GOALS: pallaitive      Breast cancer metastasized to skin ([facility]  02/05/2014 Initial Diagnosis   Breast cancer metastasized to skin ([facility]   Malignant neoplasm of overlapping sites of left breast in female, estrogen receptor positive ([facility]  07/24/2016 Initial Diagnosis   Cancer of overlapping sites of left female breast ([place]   01/06/2019 - 08/05/2020 Chemotherapy   Patient is on Treatment Plan : BREAST Abemaciclib + Fulvestrant q28d     03/22/2022 -  Chemotherapy   Patient is on Treatment Plan : BREAST Paclitaxel D1,8,15 q28d      INTERVAL HISTORY: Accompanied by  daughter; Ambulating independently.  A very pleasant 81 year old African-American female patient with above  history of ER/PR positive breast cancer stage IV currently on Taxol single agent is here for follow-up/is here to review the results of the CT scan.  Chemotherapy was held 2 weeks ago because of loss of balance/concern for worsening neuropathy...   In the interim patient was a evaluated in the emergency room post fall.  No loss of consciousness; lost balance.  Head CT was not done.  Chest x-ray overall stable.  Of note patient had chemotherapy the day prior to the fall.  She also had 3 beers.  Concerns of numbness of the legs.  No headaches.  Patient continues to complain of moderate fatigue.   No nausea no vomiting.  No fever no chills.  No rash on palms and soles.  Chronic joint pains.  Not worse.  No sores in the mouth.  No diarrhea.  Review of Systems  Constitutional:  Positive for malaise/fatigue. Negative for chills, diaphoresis, fever and weight loss.  HENT:  Negative for nosebleeds and sore throat.   Eyes:  Negative for double vision.  Respiratory:  Negative for cough, hemoptysis, sputum production, shortness of breath and wheezing.   Cardiovascular:  Negative for chest pain, palpitations, orthopnea and leg swelling.  Gastrointestinal:  Negative for abdominal pain, blood in stool, constipation, diarrhea, heartburn, melena, nausea and vomiting.  Musculoskeletal:  Positive for back pain and joint pain.  Skin: Negative.  Negative for itching and rash.  Neurological:  Negative for tingling, focal weakness, weakness and headaches.  Endo/Heme/Allergies:  Does not bruise/bleed easily.  Psychiatric/Behavioral:  Negative for depression. The patient is not nervous/anxious and does not have insomnia.  PAST MEDICAL HISTORY :  Past Medical History:  Diagnosis Date   Aneurysm ([place]) 2007   Arthritis    Cancer ([place]) 2007   diagnosed 01/07 left breast with simple mastectomu & sn bx. The pt had a 3.5cm tumor. Frozen section report on the 2 sn were negative. On permanent sections a 0.67m  micrometastasis was identified. She was not felt to require a complete axillary dissection. She has completed her Tamoxifen therapy and has been released from routine f/u with medical oncology service.   COPD (chronic obstructive pulmonary disease) (HCC)    Diabetes mellitus without complication ([facility]    Hyperlipidemia    Hypertension    since age 81  Malignant neoplasm of upper-outer quadrant of female breast ([facility] 12/2013   Mastectomy site recurrence resected 01/26/2014, ER 90%, PR 0%, HER-2/neu nonamplified.   Other benign neoplasm of connective and other soft tissue of trunk, unspecified 2014   Personal history of colonic polyps    Personal history of tobacco use, presenting hazards to health    Special screening for malignant neoplasms, colon    Unspecified disorder of skin and subcutaneous tissue 02/19/2013   biopsy of skin over the sternum on the right breast showed hypertrophic scar,keloid formation.    PAST SURGICAL HISTORY :   Past Surgical History:  Procedure Laterality Date   ABDOMINAL HYSTERECTOMY  2004   total   BREAST BIOPSY Right January 26, 2014   Core biopsy for mild increase breast uptake on PET scan, usual ductal hyperplasia and stromal fibrosis   BREAST SURGERY Left 2007   mastectomy   chest wall mass  01/26/14   CHOLECYSTECTOMY  2007   COLONOSCOPY W/ POLYPECTOMY  2011   Dr. EBrynda Greathouse  COLONOSCOPY WITH PROPOFOL N/A 12/22/2021   Procedure: COLONOSCOPY WITH PROPOFOL;  Surgeon: AJonathon Bellows MD;  [facility]  Service: Gastroenterology;  Laterality: N/A;   EYE SURGERY Right    cataract surgery   head surgery  1991   IR IMAGING GUIDED PORT INSERTION  03/13/2022   MASTECTOMY Left 2007   PORT-A-CATH REMOVAL  2008   PORTACATH PLACEMENT  2007    FAMILY HISTORY :   Family History  Problem Relation Age of Onset   Cancer Other        ovarian,breast,colon cancers;relations not listed   Breast cancer Neg Hx     SOCIAL HISTORY:   Social History    Tobacco Use   Smoking status: Some Days    Packs/day: 1.00    Years: 50.00    Total pack years: 50.00    Types: Cigarettes   Smokeless tobacco: Never  Vaping Use   Vaping Use: Some days  Substance Use Topics   Alcohol use: No    Alcohol/week: 0.0 standard drinks of alcohol   Drug use: No    ALLERGIES:  is allergic to metoprolol.  MEDICATIONS:  Current Outpatient Medications  Medication Sig Dispense Refill   amLODipine (NORVASC) 10 MG tablet TAKE 1 TABLET(10 MG) BY MOUTH DAILY 90 tablet 1   atorvastatin (LIPITOR) 20 MG tablet Take 20 mg by mouth daily at 6 PM.     citalopram (CELEXA) 40 MG tablet TAKE 1 TABLET(40 MG) BY MOUTH DAILY 90 tablet 1   lidocaine-prilocaine (EMLA) cream Apply on the port. 30 -45 min  prior to port access. 30 g 3   lisinopril (ZESTRIL) 2.5 MG tablet Take 1 tablet (2.5 mg total) by mouth daily. 90 tablet 1   meloxicam (MOBIC) 15 MG  tablet TAKE 1 TABLET(15 MG) BY MOUTH DAILY 90 tablet 1   methocarbamol (ROBAXIN) 500 MG tablet Take 1 tablet (500 mg total) by mouth in the morning and at bedtime. 60 tablet 1   ondansetron (ZOFRAN) 8 MG tablet One pill every 8 hours as needed for nausea/vomitting. 40 tablet 1   prochlorperazine (COMPAZINE) 10 MG tablet TAKE 1 TABLET(10 MG) BY MOUTH EVERY 6 HOURS AS NEEDED FOR NAUSEA OR VOMITING 40 tablet 1   No current facility-administered medications for this visit.   Facility-Administered Medications Ordered in Other Visits  Medication Dose Route Frequency Provider Last Rate Last Admin   heparin lock flush 100 unit/mL  500 Units Intravenous Once Charlaine Dalton R, MD       sodium chloride flush (NS) 0.9 % injection 10 mL  10 mL Intravenous PRN [name], MD   10 mL at 06/15/22 4166    PHYSICAL EXAMINATION: ECOG PERFORMANCE STATUS: 0 - Asymptomatic  BP 125/68 (BP Location: Right Arm, Patient Position: Sitting, Cuff Size: Normal)   Pulse 80   Temp 98.7 F (37.1 C) (Tympanic)   Ht 5' 6"  (1.676 m)    Wt 146 lb (66.2 kg)   SpO2 100%   BMI 23.57 kg/m   Filed Weights   06/15/22 0836  Weight: 146 lb (66.2 kg)       Physical Exam HENT:     Head: Normocephalic and atraumatic.     Mouth/Throat:     Pharynx: No oropharyngeal exudate.  Eyes:     Pupils: Pupils are equal, round, and reactive to light.  Cardiovascular:     Rate and Rhythm: Normal rate and regular rhythm.  Pulmonary:     Effort: No respiratory distress.     Breath sounds: No wheezing.  Abdominal:     General: Bowel sounds are normal. There is no distension.     Palpations: Abdomen is soft. There is no mass.     Tenderness: There is no abdominal tenderness. There is no guarding or rebound.  Musculoskeletal:        General: No tenderness. Normal range of motion.     Cervical back: Normal range of motion and neck supple.  Skin:    General: Skin is warm.  Neurological:     Mental Status: She is alert and oriented to person, place, and time.  Psychiatric:        Mood and Affect: Affect normal.    LABORATORY DATA:  I have reviewed the data as listed    Component Value Date/Time   NA 135 06/15/2022 0813   NA 139 05/30/2014 0127   K 3.4 (L) 06/15/2022 0813   K 3.7 05/30/2014 0127   CL 101 06/15/2022 0813   CL 106 05/30/2014 0127   CO2 24 06/15/2022 0813   CO2 25 05/30/2014 0127   GLUCOSE 141 (H) 06/15/2022 0813   GLUCOSE 136 (H) 05/30/2014 0127   BUN 15 06/15/2022 0813   BUN 19 (H) 05/30/2014 0127   CREATININE 1.11 (H) 06/15/2022 0813   CREATININE 0.98 05/30/2014 0127   CALCIUM 8.9 06/15/2022 0813   CALCIUM 9.0 05/30/2014 0127   PROT 7.7 06/15/2022 0813   PROT 7.8 05/08/2014 1131   ALBUMIN 3.7 06/15/2022 0813   ALBUMIN 3.7 05/08/2014 1131   AST 35 06/15/2022 0813   AST 20 05/08/2014 1131   ALT 15 06/15/2022 0813   ALT 18 05/08/2014 1131   ALKPHOS 73 06/15/2022 0813   ALKPHOS 58 05/08/2014 1131   BILITOT  0.8 06/15/2022 0813   BILITOT 0.4 05/08/2014 1131   GFRNONAA 51 (L) 06/15/2022 0813    GFRNONAA 58 (L) 05/30/2014 0127   GFRAA 47 (L) 09/02/2020 0920   GFRAA >60 05/30/2014 0127    No results found for: "SPEP", "UPEP"  Lab Results  Component Value Date   WBC 10.4 06/15/2022   NEUTROABS 7.2 06/15/2022   HGB 10.4 (L) 06/15/2022   HCT 32.8 (L) 06/15/2022   MCV 94.0 06/15/2022   PLT 251 06/15/2022      Chemistry      Component Value Date/Time   NA 135 06/15/2022 0813   NA 139 05/30/2014 0127   K 3.4 (L) 06/15/2022 0813   K 3.7 05/30/2014 0127   CL 101 06/15/2022 0813   CL 106 05/30/2014 0127   CO2 24 06/15/2022 0813   CO2 25 05/30/2014 0127   BUN 15 06/15/2022 0813   BUN 19 (H) 05/30/2014 0127   CREATININE 1.11 (H) 06/15/2022 0813   CREATININE 0.98 05/30/2014 0127      Component Value Date/Time   CALCIUM 8.9 06/15/2022 0813   CALCIUM 9.0 05/30/2014 0127   ALKPHOS 73 06/15/2022 0813   ALKPHOS 58 05/08/2014 1131   AST 35 06/15/2022 0813   AST 20 05/08/2014 1131   ALT 15 06/15/2022 0813   ALT 18 05/08/2014 1131   BILITOT 0.8 06/15/2022 0813   BILITOT 0.4 05/08/2014 1131         RADIOGRAPHIC STUDIES: I have personally reviewed the radiological images as listed and agreed with the findings in the report. No results found.   ASSESSMENT & PLAN:   Malignant neoplasm of overlapping sites of left breast in female, estrogen receptor positive ([place]) #Left breast ipsilateral chest wall recurrence stage IV [2015]-bone/liver metastases/lung nodules ; Currently on Taxol weekly. CT scan JUNE 18th, 2023- Multiple subpleural pulmonary nodules; subtly hypodense lesion of the anterior left lobe of the liver; Unchanged, widespread sclerotic osseous metastatic disease involving the axial skeleton; No evidence of new metastatic disease in the chest, abdomen, or pelvis.  CHECKED with UNABLE to run-ER/PR HER2/neu testing in 2007.  # HOLD Taxol weekly cycle #3 day-15 today-given the fall/neuropathy [see below].   JUNE 9th, 2023- 2D echo-EF  55-60%. Recommend liver Biopsy.  Will discuss with IR.   # Fall-appears mechanical/neuropathy.  Consider brain imaging given history of metastatic disease- STABLE.   # PN-2-3 sec to Taxol- monitor for now; hold chemotherapy.  Consider Neurontin. STABLE.   # Hypocalcemia-continue calcium plus vitamin D twice daily; vitamin D 25-OH- 16 [June 2023]; recommend Ergocaliferol 50,000 unit/weekly.   # Bilateral hip pain- arthritis-[s/p ortho evaluation]; on meloxicam/robaxin- refilled.    STABLE.   # HTN- continue amlodipine/Lisinopril-systolic blood pressure 211H.  Monitor closely.  STABLE.   # Bone lesions: discussed zometa. Recommend ca [500]+vit D [1000 IU]. On Zometa q 4 weeks;   # Mild anemia hemoglobin- 11-12/ CKD- STABLE.       # CKD- stage III- GFR 48-50- STABLE.   # Anxiety/depression- Celexa; recommend social worker referral.  #Incidental findings on Imaging  CT scan JUNE 18th-Duplication of the right ureters, with new, mild right hydronephrosis and hydroureter to the ureterovesicular junction uncertain significance, intermittently seen on prior examinations and perhaps related to vesicoureteral reflux in the setting of ureteral duplication, with no evidence of obstructing calculus, mass, or other apparent etiology of hydronephrosis;6. Emphysema'  Coronary artery disease. I reviewed/discussed/counseled the patient.    ? Zometa q4 w; cycle#3-d-8  # DISPOSITION:  #  HOLD chemo today; de-access.  # follow up in 3 weeks--; MD; cbc/cmp;CEA/ca27-29; Taxol- Dr.B          [name], MD 06/15/2022 9:04 AM

## 2022-06-15 NOTE — Assessment & Plan Note (Addendum)
#  Left breast ipsilateral chest wall recurrence stage IV [2015]-bone/liver metastases/lung nodules ; Currently on Taxol weekly. CT scan JUNE 18th, 2023- Multiple subpleural pulmonary nodules; subtly hypodense lesion of the anterior left lobe of the liver; Unchanged, widespread sclerotic osseous metastatic disease involving the axial skeleton; No evidence of new metastatic disease in the chest, abdomen, or pelvis. CHECKED with UNABLE to run-ER/PR HER2/neu testing in 2007.  # HOLD Taxol weekly cycle #3 day-15 today-given the fall/neuropathy [see below].   JUNE 9th, 2023- 2D echo-EF  55-60%. Recommend liver Biopsy. Will discuss with IR.   # Fall-appears mechanical/neuropathy.  Consider brain imaging given history of metastatic disease- STABLE.   # PN-2-3 sec to Taxol- monitor for now; hold chemotherapy.  Consider Neurontin. STABLE.   # Hypocalcemia-continue calcium plus vitamin D twice daily; vitamin D 25-OH- 16 [June 2023]; recommend Ergocaliferol 50,000 unit/weekly.   # Bilateral hip pain- arthritis-[s/p ortho evaluation]; on meloxicam/robaxin- refilled.    STABLE.   # HTN- continue amlodipine/Lisinopril-systolic blood pressure 200J.  Monitor closely.  STABLE.   # Bone lesions: discussed zometa. Recommend ca [500]+vit D [1000 IU]. On Zometa q 4 weeks;   # Mild anemia hemoglobin- 11-12/ CKD- STABLE.       # CKD- stage III- GFR 48-50- STABLE.   # Anxiety/depression- Celexa; recommend social worker referral.  #Incidental findings on Imaging  CT scan JUNE 18th-Duplication of the right ureters, with new, mild right hydronephrosis and hydroureter to the ureterovesicular junction uncertain significance, intermittently seen on prior examinations and perhaps related to vesicoureteral reflux in the setting of ureteral duplication, with no evidence of obstructing calculus, mass, or other apparent etiology of hydronephrosis;6. Emphysema'  Coronary artery disease. I reviewed/discussed/counseled the  patient.    ? Zometa q4 w; cycle#3-d-8  # DISPOSITION:  # liver biopsy -US guided # HOLD chemo today; de-access.  # follow up in 3 weeks--; MD; cbc/cmp;CEA/ca27-29; Taxol- Dr.B

## 2022-06-16 ENCOUNTER — Telehealth: Payer: Self-pay

## 2022-06-16 ENCOUNTER — Encounter: Payer: Self-pay | Admitting: Internal Medicine

## 2022-06-16 ENCOUNTER — Other Ambulatory Visit: Payer: Self-pay | Admitting: Internal Medicine

## 2022-06-16 DIAGNOSIS — C50812 Malignant neoplasm of overlapping sites of left female breast: Secondary | ICD-10-CM

## 2022-06-16 LAB — CANCER ANTIGEN 27.29: CA 27.29: 180.8 U/mL — ABNORMAL HIGH (ref 0.0–38.6)

## 2022-06-16 NOTE — Telephone Encounter (Signed)
Schedule request for US liver bx faxed to specialty scheduling.

## 2022-06-29 ENCOUNTER — Other Ambulatory Visit: Payer: Self-pay | Admitting: Internal Medicine

## 2022-06-29 DIAGNOSIS — C50812 Malignant neoplasm of overlapping sites of left female breast: Secondary | ICD-10-CM

## 2022-06-29 DIAGNOSIS — I159 Secondary hypertension, unspecified: Secondary | ICD-10-CM

## 2022-07-04 ENCOUNTER — Encounter: Payer: Self-pay | Admitting: Internal Medicine

## 2022-07-04 ENCOUNTER — Other Ambulatory Visit: Payer: Self-pay | Admitting: Radiology

## 2022-07-05 ENCOUNTER — Other Ambulatory Visit: Payer: Self-pay | Admitting: Internal Medicine

## 2022-07-05 ENCOUNTER — Other Ambulatory Visit: Payer: Self-pay

## 2022-07-05 ENCOUNTER — Ambulatory Visit
Admission: RE | Admit: 2022-07-05 | Discharge: 2022-07-05 | Disposition: A | Discharge status: Home / Self Care | Payer: Medicare HMO | Source: Ambulatory Visit | Attending: Internal Medicine | Admitting: Internal Medicine

## 2022-07-05 DIAGNOSIS — K649 Unspecified hemorrhoids: Secondary | ICD-10-CM | POA: Diagnosis present

## 2022-07-05 DIAGNOSIS — C7951 Secondary malignant neoplasm of bone: Secondary | ICD-10-CM | POA: Diagnosis present

## 2022-07-05 DIAGNOSIS — E119 Type 2 diabetes mellitus without complications: Secondary | ICD-10-CM | POA: Diagnosis not present

## 2022-07-05 DIAGNOSIS — F17210 Nicotine dependence, cigarettes, uncomplicated: Secondary | ICD-10-CM | POA: Diagnosis present

## 2022-07-05 DIAGNOSIS — C50812 Malignant neoplasm of overlapping sites of left female breast: Secondary | ICD-10-CM | POA: Diagnosis present

## 2022-07-05 DIAGNOSIS — K921 Melena: Secondary | ICD-10-CM | POA: Diagnosis not present

## 2022-07-05 DIAGNOSIS — K08109 Complete loss of teeth, unspecified cause, unspecified class: Secondary | ICD-10-CM | POA: Diagnosis present

## 2022-07-05 DIAGNOSIS — Z791 Long term (current) use of non-steroidal anti-inflammatories (NSAID): Secondary | ICD-10-CM | POA: Diagnosis not present

## 2022-07-05 DIAGNOSIS — Z7952 Long term (current) use of systemic steroids: Secondary | ICD-10-CM | POA: Diagnosis not present

## 2022-07-05 DIAGNOSIS — I10 Essential (primary) hypertension: Secondary | ICD-10-CM | POA: Diagnosis not present

## 2022-07-05 DIAGNOSIS — I129 Hypertensive chronic kidney disease with stage 1 through stage 4 chronic kidney disease, or unspecified chronic kidney disease: Secondary | ICD-10-CM | POA: Diagnosis present

## 2022-07-05 DIAGNOSIS — C787 Secondary malignant neoplasm of liver and intrahepatic bile duct: Secondary | ICD-10-CM | POA: Diagnosis present

## 2022-07-05 DIAGNOSIS — E876 Hypokalemia: Secondary | ICD-10-CM | POA: Diagnosis present

## 2022-07-05 DIAGNOSIS — Z888 Allergy status to other drugs, medicaments and biological substances status: Secondary | ICD-10-CM | POA: Diagnosis not present

## 2022-07-05 DIAGNOSIS — R160 Hepatomegaly, not elsewhere classified: Secondary | ICD-10-CM | POA: Insufficient documentation

## 2022-07-05 DIAGNOSIS — Q632 Ectopic kidney: Secondary | ICD-10-CM | POA: Diagnosis not present

## 2022-07-05 DIAGNOSIS — K922 Gastrointestinal hemorrhage, unspecified: Secondary | ICD-10-CM | POA: Diagnosis present

## 2022-07-05 DIAGNOSIS — Z170 Estrogen receptor positive status [ER+]: Secondary | ICD-10-CM | POA: Diagnosis not present

## 2022-07-05 DIAGNOSIS — K5730 Diverticulosis of large intestine without perforation or abscess without bleeding: Secondary | ICD-10-CM | POA: Diagnosis not present

## 2022-07-05 DIAGNOSIS — C7800 Secondary malignant neoplasm of unspecified lung: Secondary | ICD-10-CM | POA: Diagnosis not present

## 2022-07-05 DIAGNOSIS — F17200 Nicotine dependence, unspecified, uncomplicated: Secondary | ICD-10-CM | POA: Diagnosis not present

## 2022-07-05 DIAGNOSIS — N189 Chronic kidney disease, unspecified: Secondary | ICD-10-CM | POA: Diagnosis present

## 2022-07-05 DIAGNOSIS — K769 Liver disease, unspecified: Secondary | ICD-10-CM | POA: Diagnosis not present

## 2022-07-05 DIAGNOSIS — D62 Acute posthemorrhagic anemia: Secondary | ICD-10-CM | POA: Diagnosis present

## 2022-07-05 DIAGNOSIS — K7689 Other specified diseases of liver: Secondary | ICD-10-CM | POA: Diagnosis not present

## 2022-07-05 DIAGNOSIS — E785 Hyperlipidemia, unspecified: Secondary | ICD-10-CM | POA: Diagnosis present

## 2022-07-05 DIAGNOSIS — J449 Chronic obstructive pulmonary disease, unspecified: Secondary | ICD-10-CM | POA: Diagnosis present

## 2022-07-05 DIAGNOSIS — D649 Anemia, unspecified: Secondary | ICD-10-CM | POA: Diagnosis not present

## 2022-07-05 DIAGNOSIS — M1990 Unspecified osteoarthritis, unspecified site: Secondary | ICD-10-CM | POA: Diagnosis present

## 2022-07-05 DIAGNOSIS — Z90710 Acquired absence of both cervix and uterus: Secondary | ICD-10-CM | POA: Diagnosis not present

## 2022-07-05 DIAGNOSIS — Z79899 Other long term (current) drug therapy: Secondary | ICD-10-CM | POA: Diagnosis not present

## 2022-07-05 DIAGNOSIS — E1122 Type 2 diabetes mellitus with diabetic chronic kidney disease: Secondary | ICD-10-CM | POA: Diagnosis present

## 2022-07-05 DIAGNOSIS — Z9012 Acquired absence of left breast and nipple: Secondary | ICD-10-CM | POA: Diagnosis not present

## 2022-07-05 DIAGNOSIS — Z853 Personal history of malignant neoplasm of breast: Secondary | ICD-10-CM | POA: Diagnosis not present

## 2022-07-05 LAB — PROTIME-INR
INR: 1.1 (ref 0.8–1.2)
Prothrombin Time: 13.9 seconds (ref 11.4–15.2)

## 2022-07-05 LAB — GLUCOSE, CAPILLARY: Glucose-Capillary: 142 mg/dL — ABNORMAL HIGH (ref 70–99)

## 2022-07-05 MED ORDER — ONDANSETRON HCL 4 MG/2ML IJ SOLN
INTRAMUSCULAR | Status: AC
  Filled 2022-07-05: qty 2

## 2022-07-05 MED ORDER — FENTANYL CITRATE (PF) 100 MCG/2ML IJ SOLN
INTRAMUSCULAR | Status: AC
  Filled 2022-07-05: qty 2

## 2022-07-05 MED ORDER — SODIUM CHLORIDE 0.9 % IV SOLN: INTRAVENOUS | Status: DC

## 2022-07-05 MED ORDER — SULFUR HEXAFLUORIDE MICROSPH 60.7-25 MG IJ SUSR
5.0000 mL | Freq: Once | INTRAMUSCULAR | Status: AC | PRN
  Administered 2022-07-05: 10 mL via INTRAVENOUS

## 2022-07-05 MED ORDER — MIDAZOLAM HCL 2 MG/2ML IJ SOLN
INTRAMUSCULAR | Status: AC
  Filled 2022-07-05: qty 2

## 2022-07-05 MED ORDER — ONDANSETRON HCL 4 MG/2ML IJ SOLN: 4.0000 mg | Freq: Once | INTRAMUSCULAR | Status: DC

## 2022-07-05 NOTE — Procedures (Signed)
Vascular and Interventional Radiology Procedure Note  Patient: Jessica Compton DOB: 1941/06/30 Medical Record Number: [id number] Note Date/Time: 07/05/22 12:17 PM   Performing Physician: Michaelle Birks, MD Assistant(s): None  Diagnosis: Breast CA w liver lesions  Procedure:  CONTRAST-ENHANCED ULTRASOUND OF LIVER LIVER MASS BIOPSY   Anesthesia: Conscious Sedation Complications: None Estimated Blood Loss: Minimal Specimens: Sent for Pathology   Findings:  CE-US with enhancing liver mass, and rapid washout. Successful Ultrasound-guided biopsy of liver mass. A total of 3 samples were obtained. Samples submitted in Formalin.   Hemostasis of the tract was achieved using Gelfoam Slurry Embolization.   Plan: Bed rest for 2 hours.  See detailed procedure note with images in PACS. The patient tolerated the procedure well without incident or complication and was returned to Recovery in stable condition.    Michaelle Birks, MD [facility] Radiology   Pager. [place]

## 2022-07-05 NOTE — H&P (Signed)
Chief Complaint: Patient was seen in consultation today for liver lesion at the request of Brahmanday,Govinda R  Referring Physician(s): [name]  Supervising Physician: Michaelle Birks  Patient Status: [facility] - Out-pt  History of Present Illness: Jessica Compton is a 81 y.o. female with significant PMHx of left sided breast cancer in 2007 with recurrence and PET imaging in 2022 with hypermetabolic liver lesions and spine lesions undergoing treatment, CT 02/2022 with interval enlargement of subtly hypodense lesion of the anterior left lobe of liver. The patient presents today for a liver lesion biopsy under ultrasound guidance with contrast. Patient also with PMHx for COPD not on chronic oxygen, DM, HTN.   The patient denies any current chest pain, she does admit to worsening chronic shortness of breath, she currently does not wear oxygen at home. She denies any current blood thinner use, denies any known bleeding or clotting disorder. She has no known complications to sedation.    Past Medical History:  Diagnosis Date   Aneurysm ([place]) 2007   Arthritis    Cancer ([place]) 2007   diagnosed 01/07 left breast with simple mastectomu & sn bx. The pt had a 3.5cm tumor. Frozen section report on the 2 sn were negative. On permanent sections a 0.81m micrometastasis was identified. She was not felt to require a complete axillary dissection. She has completed her Tamoxifen therapy and has been released from routine f/u with medical oncology service.   COPD (chronic obstructive pulmonary disease) (HCC)    Diabetes mellitus without complication ([place]    Hyperlipidemia    Hypertension    since age 89  [place]alignant [place] of [place]-outer quadrant [place]   Mastectomy site recurrence resected 01/26/2014, ER 90%, PR 0%, HER-2/neu nonamplified.   Other benign neoplasm of connective and other soft tissue of trunk, unspecified 2014   Personal history of colonic polyps     Personal history of tobacco use, presenting hazards to health    Special screening for malignant neoplasms, colon    Unspecified disorder of skin and subcutaneous tissue 02/19/2013   biopsy of skin over the sternum on the right breast showed hypertrophic scar,keloid formation.    Past Surgical History:  Procedure Laterality Date   ABDOMINAL HYSTERECTOMY  2004   total   BREAST BIOPSY Right January 26, 2014   Core biopsy for mild increase breast uptake on PET scan, usual ductal hyperplasia and stromal fibrosis   BREAST SURGERY Left 2007   mastectomy   chest wall mass  01/26/14   CHOLECYSTECTOMY  2007   COLONOSCOPY W/ POLYPECTOMY  2011   Dr. EBrynda Greathouse  COLONOSCOPY WITH PROPOFOL N/A 12/22/2021   Procedure: COLONOSCOPY WITH PROPOFOL;  Surgeon: AJonathon Bellows MD;  [facility];  Service: Gastroenterology;  Laterality: N/A;   EYE SURGERY Right    cataract surgery   head surgery  1991   IR IMAGING GUIDED PORT INSERTION  03/13/2022   MASTECTOMY Left 2007   PORT-A-CATH REMOVAL  2008   PORTACATH PLACEMENT  2007    Allergies: Metoprolol  Medications: Prior to Admission medications   Medication Sig Start Date End Date Taking? Authorizing Provider  amLODipine (NORVASC) 10 MG tablet TAKE 1 TABLET(10 MG) BY MOUTH DAILY 09/12/21  Yes BCammie Sickle MD  atorvastatin (LIPITOR) 20 MG tablet Take 20 mg by mouth daily at 6 PM. 08/14/14  Yes [provider]  citalopram (CELEXA) 40 MG tablet TAKE 1 TABLET(40 MG) BY MOUTH DAILY 02/27/22  Yes Brahmanday,  Elisha Headland, MD  ergocalciferol (VITAMIN D2) 1.25 MG (50000 UT) capsule Take 1 capsule (50,000 Units total) by mouth once a week. 06/15/22  Yes Charlaine Dalton R, MD  lisinopril (ZESTRIL) 2.5 MG tablet TAKE 1 TABLET(2.5 MG) BY MOUTH DAILY 07/04/22  Yes [name], MD  meloxicam (MOBIC) 15 MG tablet TAKE 1 TABLET(15 MG) BY MOUTH DAILY 04/20/22  Yes [name], MD  methocarbamol (ROBAXIN) 500 MG tablet Take 1 tablet  (500 mg total) by mouth in the morning and at bedtime. 04/20/22  Yes [name], MD  lidocaine-prilocaine (EMLA) cream Apply on the port. 30 -45 min  prior to port access. 03/08/22   [name], MD  ondansetron (ZOFRAN) 8 MG tablet One pill every 8 hours as needed for nausea/vomitting. 03/08/22   [name], MD  prochlorperazine (COMPAZINE) 10 MG tablet TAKE 1 TABLET(10 MG) BY MOUTH EVERY 6 HOURS AS NEEDED FOR NAUSEA OR VOMITING 03/27/22   Hughie Closs, PA-C     Family History  Problem Relation Age of Onset   Cancer Other        ovarian,breast,colon cancers;relations not listed   Breast cancer Neg Hx     Social History   Socioeconomic History   Marital status: Single    Spouse name: Not on file   Number of children: Not on file   Years of education: Not on file   Highest education level: Not on file  Occupational History   Not on file  Tobacco Use   Smoking status: Some Days    Packs/day: 1.00    Years: 50.00    Total pack years: 50.00    Types: Cigarettes   Smokeless tobacco: Never  Vaping Use   Vaping Use: Some days  Substance and Sexual Activity   Alcohol use: No    Alcohol/week: 0.0 standard drinks of alcohol   Drug use: No   Sexual activity: Not on file  Other Topics Concern   Not on file  Social History Narrative   Not on file   Social Determinants of Health   Financial Resource Strain: Low Risk  (06/05/2022)   Overall Financial Resource Strain (CARDIA)    Difficulty of Paying Living Expenses: Not hard at all  Food Insecurity: No Food Insecurity (06/05/2022)   Hunger Vital Sign    Worried About Running Out of Food in the Last Year: Never true    Ran Out of Food in the Last Year: Never true  Transportation Needs: No Transportation Needs (06/05/2022)   PRAPARE - Hydrologist (Medical): No    Lack of Transportation (Non-Medical): No  Physical Activity: Inactive (06/05/2022)   Exercise Vital Sign     Days of Exercise per Week: 0 days    Minutes of Exercise per Session: 0 min  Stress: No Stress Concern Present (06/05/2022)   [place]    Feeling of Stress : Only a little  Social Connections: Moderately Isolated (06/05/2022)   Social Connection and Isolation Panel [NHANES]    Frequency of Communication with Friends and Family: Three times a week    Frequency of Social Gatherings with Friends and Family: Three times a week    Attends Religious Services: 1 to 4 times per year    Active Member of Clubs or Organizations: No    Attends Archivist Meetings: Never    Marital Status: Never married    Review of  Systems: A 12 point ROS discussed and pertinent positives are indicated in the HPI above.  All other systems are negative.  Review of Systems  Vital Signs: BP 135/74   Pulse 80   Temp 98.4 F (36.9 C)   Resp (!) 24   Ht 5' 5"  (1.651 m)   Wt 146 lb (66.2 kg)   SpO2 98%   BMI 24.30 kg/m   Physical Exam Constitutional:      General: She is not in acute distress.    Appearance: Normal appearance.  HENT:     Head: Normocephalic and atraumatic.  Cardiovascular:     Rate and Rhythm: Normal rate and regular rhythm.  Pulmonary:     Effort: Pulmonary effort is normal. No respiratory distress.     Comments: Right sided port intact Neurological:     Mental Status: She is alert and oriented to person, place, and time.     Imaging: CT CHEST ABDOMEN PELVIS W CONTRAST  Result Date: 06/11/2022 CLINICAL DATA:  Metastatic left breast cancer, assess treatment response * Tracking Code: BO * EXAM: CT CHEST, ABDOMEN, AND PELVIS WITH CONTRAST TECHNIQUE: Multidetector CT imaging of the chest, abdomen and pelvis was performed following the standard protocol during bolus administration of intravenous contrast. RADIATION DOSE REDUCTION: This exam was performed according to the departmental dose-optimization program which  includes automated exposure control, adjustment of the mA and/or kV according to patient size and/or use of iterative reconstruction technique. CONTRAST:  9m OMNIPAQUE IOHEXOL 300 MG/ML SOLN, additional oral enteric contrast COMPARISON:  03/03/2022 FINDINGS: CT CHEST FINDINGS Cardiovascular: Right chest port catheter. Aortic atherosclerosis. Normal heart size. Three-vessel coronary artery calcifications. No pericardial effusion. Mediastinum/Nodes: No enlarged mediastinal, hilar, or axillary lymph nodes. Thyroid gland, trachea, and esophagus demonstrate no significant findings. Lungs/Pleura: Severe centrilobular emphysema. Multiple subpleural pulmonary nodules of the right lung base are unchanged, largest nodule medially measuring 2.0 x 1.2 cm (series 4, image 110). Minimal subpleural radiation fibrosis of the anterior left upper lobe and lingula (series 4, image 66). No pleural effusion or pneumothorax. Musculoskeletal: Status post left mastectomy CT ABDOMEN PELVIS FINDINGS Hepatobiliary: Unchanged subtly hypodense lesion of the anterior left lobe of the liver, hepatic segment IVa, dominant component measuring 1.6 x 0.9 cm (series 2, image 51). Status post cholecystectomy. Postoperative biliary dilatation. Pancreas: Unremarkable. No pancreatic ductal dilatation or surrounding inflammatory changes. Spleen: Normal in size without significant abnormality. Adrenals/Urinary Tract: Adrenal glands are unremarkable. Duplication of the right ureters, with new, mild right hydronephrosis and hydroureter to the ureterovesicular junction (series 2, image 64, 98). No left-sided hydronephrosis. No renal calculi or lesions. Bladder is unremarkable. Stomach/Bowel: Stomach is within normal limits. Diverticulum of the transverse duodenum (series 2, image 63). Appendix appears normal. No evidence of bowel wall thickening, distention, or inflammatory changes. Pancolonic diverticulosis. Vascular/Lymphatic: Aortic atherosclerosis. No  enlarged abdominal or pelvic lymph nodes. Reproductive: Status post hysterectomy. Other: No abdominal wall hernia or abnormality. No ascites. Musculoskeletal: No acute osseous findings. Unchanged, widespread sclerotic osseous metastatic disease involving the axial skeleton. IMPRESSION: 1. Multiple subpleural pulmonary nodules about the right lung base are unchanged. 2. Unchanged subtly hypodense lesion of the anterior left lobe of the liver. 3. Unchanged, widespread sclerotic osseous metastatic disease involving the axial skeleton. 4. No evidence of new metastatic disease in the chest, abdomen, or pelvis. 5. Duplication of the right ureters, with new, mild right hydronephrosis and hydroureter to the ureterovesicular junction. This is of uncertain significance, intermittently seen on prior examinations and perhaps related to  vesicoureteral reflux in the setting of ureteral duplication, with no evidence of obstructing calculus, mass, or other apparent etiology of hydronephrosis. 6. Emphysema. 7. Coronary artery disease. Aortic Atherosclerosis (ICD10-I70.0) and Emphysema (ICD10-J43.9). Electronically Signed   By: Delanna Ahmadi M.D.   On: 06/11/2022 14:53    Labs:  CBC: Recent Labs    05/25/22 1234 05/26/22 2242 06/01/22 0822 06/15/22 0813  WBC 7.9 7.9 3.4* 10.4  HGB 10.1* 9.3* 9.4* 10.4*  HCT 31.4* 29.6* 29.6* 32.8*  PLT 331 306 326 251    COAGS: Recent Labs    07/05/22 1052  INR 1.1    BMP: Recent Labs    05/25/22 1234 05/26/22 2242 06/01/22 0822 06/15/22 0813  NA 137 134* 136 135  K 3.3* 3.2* 3.6 3.4*  CL 102 99 101 101  CO2 27 22 24 24   GLUCOSE 132* 184* 215* 141*  BUN 15 22 12 15   CALCIUM 9.4 8.7* 9.1 8.9  CREATININE 1.01* 0.90 1.02* 1.11*  GFRNONAA 57* >60 56* 51*    LIVER FUNCTION TESTS: Recent Labs    05/25/22 1234 05/26/22 2242 06/01/22 0822 06/15/22 0813  BILITOT 0.4 0.7 0.6 0.8  AST 27 30 33 35  ALT 13 15 17 15   ALKPHOS 64 53 52 73  PROT 7.6 7.5 7.1 7.7   ALBUMIN 3.9 3.7 3.9 3.7    Assessment and Plan: This is a 81 year old female with significant PMHx of left sided breast cancer in 2007 with recurrence and PET imaging in 2022 with hypermetabolic liver lesions and spine lesions undergoing treatment, CT 02/2022 with interval enlargement of subtly hypodense lesion of the anterior left lobe of liver. The patient presents today for a liver lesion biopsy under ultrasound guidance with contrast. Patient also with PMHx for COPD not on chronic oxygen, DM, HTN.   The patient has been NPO, no blood thinners taken, imaging, labs and vitals have been reviewed.  Risks and benefits of US guided contrast enhanced liver lesion biopsy with moderate sedation was discussed with the patient and/or patient's family including, but not limited to bleeding, infection, damage to adjacent structures or low yield requiring additional tests.  All of the questions were answered and there is agreement to proceed.  Consent signed and in chart.   Thank you for this interesting consult.  I greatly enjoyed meeting Jessica Compton and look forward to participating in their care.  A copy of this report was sent to the requesting provider on this date.  Electronically Signed: Hedy Jacob, PA-C 07/05/2022, 12:00 PM   I spent a total of 15 Minutes in face to face in clinical consultation, greater than 50% of which was counseling/coordinating care for liver lesion.

## 2022-07-05 NOTE — Sedation Documentation (Signed)
Pt. HOB up after liver biopsy. HR down to 45 for approx. 20 sec., then returned to 67-70. BP 120/69. Pt. C/o severe nausea without any vomiting at present.  Stable for tx to [facility] recovery. With RN escort, on monitor.

## 2022-07-06 ENCOUNTER — Inpatient Hospital Stay: Payer: Medicare HMO | Admitting: Internal Medicine

## 2022-07-06 ENCOUNTER — Inpatient Hospital Stay: Payer: Medicare HMO

## 2022-07-06 MED FILL — Dexamethasone Sodium Phosphate Inj 100 MG/10ML: INTRAMUSCULAR | Qty: 1 | Status: AC

## 2022-07-07 ENCOUNTER — Emergency Department: Payer: Medicare HMO

## 2022-07-07 ENCOUNTER — Inpatient Hospital Stay: Payer: Medicare HMO | Attending: Internal Medicine

## 2022-07-07 ENCOUNTER — Other Ambulatory Visit: Payer: Self-pay

## 2022-07-07 ENCOUNTER — Inpatient Hospital Stay: Payer: Medicare HMO

## 2022-07-07 ENCOUNTER — Encounter: Payer: Self-pay | Admitting: Internal Medicine

## 2022-07-07 ENCOUNTER — Inpatient Hospital Stay (HOSPITAL_BASED_OUTPATIENT_CLINIC_OR_DEPARTMENT_OTHER): Payer: Medicare HMO | Admitting: Internal Medicine

## 2022-07-07 ENCOUNTER — Inpatient Hospital Stay
Admission: EM | Admit: 2022-07-07 | Discharge: 2022-07-08 | DRG: 394 | Disposition: A | Discharge status: Home / Self Care | Payer: Medicare HMO | Attending: General Practice | Admitting: General Practice

## 2022-07-07 VITALS — BP 107/60 | HR 85 | Temp 98.9°F | Resp 18 | Wt 140.0 lb

## 2022-07-07 DIAGNOSIS — E876 Hypokalemia: Secondary | ICD-10-CM | POA: Diagnosis present

## 2022-07-07 DIAGNOSIS — I129 Hypertensive chronic kidney disease with stage 1 through stage 4 chronic kidney disease, or unspecified chronic kidney disease: Secondary | ICD-10-CM | POA: Diagnosis present

## 2022-07-07 DIAGNOSIS — Z90710 Acquired absence of both cervix and uterus: Secondary | ICD-10-CM

## 2022-07-07 DIAGNOSIS — E785 Hyperlipidemia, unspecified: Secondary | ICD-10-CM | POA: Diagnosis present

## 2022-07-07 DIAGNOSIS — J449 Chronic obstructive pulmonary disease, unspecified: Secondary | ICD-10-CM | POA: Diagnosis present

## 2022-07-07 DIAGNOSIS — E1122 Type 2 diabetes mellitus with diabetic chronic kidney disease: Secondary | ICD-10-CM | POA: Diagnosis present

## 2022-07-07 DIAGNOSIS — C50812 Malignant neoplasm of overlapping sites of left female breast: Secondary | ICD-10-CM

## 2022-07-07 DIAGNOSIS — M1990 Unspecified osteoarthritis, unspecified site: Secondary | ICD-10-CM | POA: Diagnosis present

## 2022-07-07 DIAGNOSIS — Z791 Long term (current) use of non-steroidal anti-inflammatories (NSAID): Secondary | ICD-10-CM

## 2022-07-07 DIAGNOSIS — Z7952 Long term (current) use of systemic steroids: Secondary | ICD-10-CM

## 2022-07-07 DIAGNOSIS — D62 Acute posthemorrhagic anemia: Secondary | ICD-10-CM | POA: Diagnosis present

## 2022-07-07 DIAGNOSIS — Z79899 Other long term (current) drug therapy: Secondary | ICD-10-CM | POA: Diagnosis not present

## 2022-07-07 DIAGNOSIS — K649 Unspecified hemorrhoids: Principal | ICD-10-CM | POA: Diagnosis present

## 2022-07-07 DIAGNOSIS — K922 Gastrointestinal hemorrhage, unspecified: Secondary | ICD-10-CM

## 2022-07-07 DIAGNOSIS — Z888 Allergy status to other drugs, medicaments and biological substances status: Secondary | ICD-10-CM | POA: Diagnosis not present

## 2022-07-07 DIAGNOSIS — D649 Anemia, unspecified: Principal | ICD-10-CM

## 2022-07-07 DIAGNOSIS — Z853 Personal history of malignant neoplasm of breast: Secondary | ICD-10-CM | POA: Diagnosis not present

## 2022-07-07 DIAGNOSIS — C7951 Secondary malignant neoplasm of bone: Secondary | ICD-10-CM | POA: Diagnosis present

## 2022-07-07 DIAGNOSIS — K08109 Complete loss of teeth, unspecified cause, unspecified class: Secondary | ICD-10-CM | POA: Diagnosis present

## 2022-07-07 DIAGNOSIS — C7800 Secondary malignant neoplasm of unspecified lung: Secondary | ICD-10-CM | POA: Insufficient documentation

## 2022-07-07 DIAGNOSIS — F17210 Nicotine dependence, cigarettes, uncomplicated: Secondary | ICD-10-CM | POA: Diagnosis present

## 2022-07-07 DIAGNOSIS — C787 Secondary malignant neoplasm of liver and intrahepatic bile duct: Secondary | ICD-10-CM | POA: Insufficient documentation

## 2022-07-07 DIAGNOSIS — Z9012 Acquired absence of left breast and nipple: Secondary | ICD-10-CM

## 2022-07-07 DIAGNOSIS — Z170 Estrogen receptor positive status [ER+]: Secondary | ICD-10-CM

## 2022-07-07 DIAGNOSIS — N189 Chronic kidney disease, unspecified: Secondary | ICD-10-CM | POA: Diagnosis present

## 2022-07-07 LAB — CBC WITH DIFFERENTIAL/PLATELET
Abs Immature Granulocytes: 0.07 10*3/uL (ref 0.00–0.07)
Basophils Absolute: 0 10*3/uL (ref 0.0–0.1)
Basophils Relative: 0 %
Eosinophils Absolute: 0.1 10*3/uL (ref 0.0–0.5)
Eosinophils Relative: 1 %
HCT: 25.3 % — ABNORMAL LOW (ref 36.0–46.0)
Hemoglobin: 7.9 g/dL — ABNORMAL LOW (ref 12.0–15.0)
Immature Granulocytes: 1 %
Lymphocytes Relative: 23 %
Lymphs Abs: 2.2 10*3/uL (ref 0.7–4.0)
MCH: 28.6 pg (ref 26.0–34.0)
MCHC: 31.2 g/dL (ref 30.0–36.0)
MCV: 91.7 fL (ref 80.0–100.0)
Monocytes Absolute: 0.9 10*3/uL (ref 0.1–1.0)
Monocytes Relative: 10 %
Neutro Abs: 6.1 10*3/uL (ref 1.7–7.7)
Neutrophils Relative %: 65 %
Platelets: 272 10*3/uL (ref 150–400)
RBC: 2.76 MIL/uL — ABNORMAL LOW (ref 3.87–5.11)
RDW: 16.4 % — ABNORMAL HIGH (ref 11.5–15.5)
WBC: 9.3 10*3/uL (ref 4.0–10.5)
nRBC: 0 % (ref 0.0–0.2)

## 2022-07-07 LAB — CBC
HCT: 25.9 % — ABNORMAL LOW (ref 36.0–46.0)
Hemoglobin: 7.8 g/dL — ABNORMAL LOW (ref 12.0–15.0)
MCH: 27.5 pg (ref 26.0–34.0)
MCHC: 30.1 g/dL (ref 30.0–36.0)
MCV: 91.2 fL (ref 80.0–100.0)
Platelets: 268 10*3/uL (ref 150–400)
RBC: 2.84 MIL/uL — ABNORMAL LOW (ref 3.87–5.11)
RDW: 16.2 % — ABNORMAL HIGH (ref 11.5–15.5)
WBC: 9.7 10*3/uL (ref 4.0–10.5)
nRBC: 0 % (ref 0.0–0.2)

## 2022-07-07 LAB — COMPREHENSIVE METABOLIC PANEL
ALT: 12 U/L (ref 0–44)
AST: 91 U/L — ABNORMAL HIGH (ref 15–41)
Albumin: 3.5 g/dL (ref 3.5–5.0)
Alkaline Phosphatase: 119 U/L (ref 38–126)
Anion gap: 8 (ref 5–15)
BUN: 16 mg/dL (ref 8–23)
CO2: 24 mmol/L (ref 22–32)
Calcium: 8.4 mg/dL — ABNORMAL LOW (ref 8.9–10.3)
Chloride: 105 mmol/L (ref 98–111)
Creatinine, Ser: 1.01 mg/dL — ABNORMAL HIGH (ref 0.44–1.00)
GFR, Estimated: 57 mL/min — ABNORMAL LOW (ref >60)
Glucose, Bld: 135 mg/dL — ABNORMAL HIGH (ref 70–99)
Potassium: 3.3 mmol/L — ABNORMAL LOW (ref 3.5–5.1)
Sodium: 137 mmol/L (ref 135–145)
Total Bilirubin: 0.4 mg/dL (ref 0.3–1.2)
Total Protein: 7.2 g/dL (ref 6.5–8.1)

## 2022-07-07 LAB — ABO/RH: ABO/RH(D): O POS

## 2022-07-07 MED ORDER — ACETAMINOPHEN 325 MG PO TABS: 650.0000 mg | ORAL_TABLET | Freq: Four times a day (QID) | ORAL | Status: DC | PRN

## 2022-07-07 MED ORDER — LISINOPRIL 5 MG PO TABS
2.5000 mg | ORAL_TABLET | Freq: Every day | ORAL | Status: DC
  Administered 2022-07-08: 2.5 mg via ORAL
  Filled 2022-07-07: qty 1

## 2022-07-07 MED ORDER — IOHEXOL 300 MG/ML  SOLN
100.0000 mL | Freq: Once | INTRAMUSCULAR | Status: AC | PRN
  Administered 2022-07-07: 100 mL via INTRAVENOUS

## 2022-07-07 MED ORDER — INSULIN ASPART 100 UNIT/ML IJ SOLN
0.0000 [IU] | INTRAMUSCULAR | Status: DC
  Administered 2022-07-08: 2 [IU] via SUBCUTANEOUS
  Filled 2022-07-07: qty 1

## 2022-07-07 MED ORDER — SODIUM CHLORIDE 0.9% IV SOLUTION
Freq: Once | INTRAVENOUS | Status: AC
  Filled 2022-07-07: qty 250

## 2022-07-07 MED ORDER — PANTOPRAZOLE INFUSION (NEW) - SIMPLE MED
8.0000 mg/h | INTRAVENOUS | Status: DC
  Administered 2022-07-07 – 2022-07-08 (×3): 8 mg/h via INTRAVENOUS
  Filled 2022-07-07 (×2): qty 100

## 2022-07-07 MED ORDER — IPRATROPIUM BROMIDE 0.02 % IN SOLN: 0.5000 mg | Freq: Four times a day (QID) | RESPIRATORY_TRACT | Status: DC | PRN

## 2022-07-07 MED ORDER — PANTOPRAZOLE 80MG IVPB - SIMPLE MED
80.0000 mg | Freq: Once | INTRAVENOUS | Status: AC
  Administered 2022-07-07: 80 mg via INTRAVENOUS
  Filled 2022-07-07: qty 100

## 2022-07-07 MED ORDER — SODIUM CHLORIDE 0.9 % IV SOLN: INTRAVENOUS | Status: DC

## 2022-07-07 MED ORDER — HYDROCODONE-ACETAMINOPHEN 5-325 MG PO TABS: 1.0000 | ORAL_TABLET | ORAL | Status: DC | PRN

## 2022-07-07 MED ORDER — MORPHINE SULFATE (PF) 2 MG/ML IV SOLN: 1.0000 mg | Freq: Four times a day (QID) | INTRAVENOUS | Status: DC | PRN

## 2022-07-07 MED ORDER — TRAZODONE HCL 50 MG PO TABS: 25.0000 mg | ORAL_TABLET | Freq: Every evening | ORAL | Status: DC | PRN

## 2022-07-07 MED ORDER — PREDNISONE 20 MG PO TABS: 20.0000 mg | ORAL_TABLET | Freq: Every day | ORAL | 0 refills | Status: DC

## 2022-07-07 MED ORDER — ATORVASTATIN CALCIUM 20 MG PO TABS
20.0000 mg | ORAL_TABLET | Freq: Every day | ORAL | Status: DC
  Administered 2022-07-08: 20 mg via ORAL
  Filled 2022-07-07: qty 1

## 2022-07-07 MED ORDER — ALBUTEROL SULFATE (2.5 MG/3ML) 0.083% IN NEBU: 2.5000 mg | INHALATION_SOLUTION | Freq: Four times a day (QID) | RESPIRATORY_TRACT | Status: DC | PRN

## 2022-07-07 MED ORDER — ONDANSETRON HCL 4 MG PO TABS: 4.0000 mg | ORAL_TABLET | Freq: Four times a day (QID) | ORAL | Status: DC | PRN

## 2022-07-07 MED ORDER — ACETAMINOPHEN 650 MG RE SUPP: 650.0000 mg | Freq: Four times a day (QID) | RECTAL | Status: DC | PRN

## 2022-07-07 MED ORDER — POTASSIUM CHLORIDE CRYS ER 20 MEQ PO TBCR
40.0000 meq | EXTENDED_RELEASE_TABLET | Freq: Once | ORAL | Status: AC
  Administered 2022-07-08: 40 meq via ORAL
  Filled 2022-07-07: qty 2

## 2022-07-07 MED ORDER — CITALOPRAM HYDROBROMIDE 20 MG PO TABS
40.0000 mg | ORAL_TABLET | Freq: Every day | ORAL | Status: DC
  Administered 2022-07-08: 40 mg via ORAL
  Filled 2022-07-07: qty 2

## 2022-07-07 MED ORDER — PANTOPRAZOLE SODIUM 40 MG IV SOLR: 40.0000 mg | Freq: Two times a day (BID) | INTRAVENOUS | Status: DC

## 2022-07-07 MED ORDER — AMLODIPINE BESYLATE 10 MG PO TABS
10.0000 mg | ORAL_TABLET | Freq: Every day | ORAL | Status: DC
  Administered 2022-07-08: 10 mg via ORAL
  Filled 2022-07-07: qty 1

## 2022-07-07 MED ORDER — ONDANSETRON HCL 4 MG/2ML IJ SOLN: 4.0000 mg | Freq: Four times a day (QID) | INTRAMUSCULAR | Status: DC | PRN

## 2022-07-07 MED FILL — Dexamethasone Sodium Phosphate Inj 100 MG/10ML: INTRAMUSCULAR | Qty: 1 | Status: AC

## 2022-07-07 NOTE — ED Provider Notes (Signed)
[facility] Provider Note    Event Date/Time   First MD Initiated Contact with Patient 07/07/22 1833     (approximate)   History   Post-op Problem   HPI  Jessica Compton is a 81 y.o. female  who, per oncology note dated earlier today has history of breast cancer and underwent liver biopsy 2 days ago, who was sent to the emergency department today by oncology because of concern for anemia. The patient states she has been weak and tired even before the biopsy but those symptoms have gotten worse since the biopsy. The patient has not noticed any bloody stool or black or tarry stool. Denies any fevers or recent illness. No abdominal pain.   Physical Exam   Triage Vital Signs: ED Triage Vitals  Enc Vitals Group     BP 07/07/22 1511 131/66     Pulse Rate 07/07/22 1511 87     Resp 07/07/22 1511 17     Temp 07/07/22 1511 99.1 F (37.3 C)     Temp Source 07/07/22 1511 Oral     SpO2 07/07/22 1511 96 %     Weight 07/07/22 1519 140 lb (63.5 kg)     Height 07/07/22 1519 '5\' 5"'$  (1.651 m)     Head Circumference --      Peak Flow --      Pain Score 07/07/22 1519 0     Pain Loc --      Pain Edu? --      Excl. in [place]? --     Most recent vital signs: Vitals:   07/07/22 1511  BP: 131/66  Pulse: 87  Resp: 17  Temp: 99.1 F (37.3 C)  SpO2: 96%    General: Awake, alert, oriented. CV:  Good peripheral perfusion. Regular rate and rhythm. Resp:  Normal effort. Lungs clear. Abd:  No distention.  Rectal:  GUIAC positive  ED Results / Procedures / Treatments   Labs (all labs ordered are listed, but only abnormal results are displayed) Labs Reviewed  CBC - Abnormal; Notable for the following components:      Result Value   RBC 2.84 (*)    Hemoglobin 7.8 (*)    HCT 25.9 (*)    RDW 16.2 (*)    All other components within normal limits  CBC  CBC  BASIC METABOLIC PANEL  CBC  PROTIME-INR  HEMOGLOBIN A1C  TYPE AND SCREEN  ABO/RH  PREPARE RBC  (CROSSMATCH)     EKG  None   RADIOLOGY None   PROCEDURES:  Critical Care performed: No  Procedures   MEDICATIONS ORDERED IN ED: Medications - No data to display   IMPRESSION / MDM / [place] / ED COURSE  I reviewed the triage vital signs and the nursing notes.                              Differential diagnosis includes, but is not limited to, gi bleed, anemia of chronic disease, post procedure bleeding.  Patient's presentation is most consistent with acute presentation with potential threat to life or bodily function.  Patient presented to the emergency department today from oncology clinic because of concerns for anemia in the setting of recent liver biopsy.  Patient CT scan here does not show any acute bleed around the site of the liver.  She was however found to be guaiac positive.  Given concerns for GI bleed and  anemia I do think patient would benefit from IV Protonix.  Discussed with findings and plan with patient and family.  Discussed with Dr. Ara Kussmaul with hospital service will plan on admission.  FINAL CLINICAL IMPRESSION(S) / ED DIAGNOSES   Final diagnoses:  Anemia, unspecified type  Gastrointestinal hemorrhage, unspecified gastrointestinal hemorrhage type     Note:  This document was prepared using Dragon voice recognition software and may include unintentional dictation errors.    Nance Pear, MD 07/07/22 2352

## 2022-07-07 NOTE — Assessment & Plan Note (Addendum)
#  Left breast ipsilateral chest wall recurrence stage IV [2015]-bone/liver metastases/lung nodules ; Currently on Taxol weekly. CT scan JUNE 18th, 2023- Multiple subpleural pulmonary nodules; subtly hypodense lesion of the anterior left lobe of the liver; Unchanged, widespread sclerotic osseous metastatic disease involving the axial skeleton; No evidence of new metastatic disease in the chest, abdomen, or pelvis. CHECKED with UNABLE to run-ER/PR HER2/neu testing in 2007.  Liver biopsy- 7/12-preliminary positive for breast cancer; ER positive.  Discussed with Dr. Rubinas; will order her-2 by FISH; and NGS.  Consider alternative chemotherapy-based on patient's NGS results.  Recommend giving patient a chemotherapy break given overall poor tolerance.  #Continue HOLDING Taxol weekly cycle #3 day-15 today-given fall/neuropathy/worsening anemia  [see below].   JUNE 9th, 2023- 2D echo-EF  55-60%.   #Acute worsening of anemia-off chemotherapy; status post recent biopsy concerning for any intermittent bleeding.  Recommend urgent evaluation emergency room for CT scan.  Patient reluctantly agrees.  Also discussed need regarding need for possible blood transfusion in the emergency room.   #Worsening fatigue-multifactorial worsening anemia see above; also recommend starting patient on prednisone 20 mg once a day.  # Fall-appears mechanical/neuropathy.  Consider brain imaging given history of metastatic disease- STABLE.   # PN-2-3 sec to Taxol- monitor for now; hold chemotherapy.  Consider Neurontin. STABLE.   # Hypocalcemia-continue calcium plus vitamin D twice daily; vitamin D 25-OH- 16 [June 2023]; currently on Ergocaliferol 50,000 unit/weekly.   # Bilateral hip pain- arthritis-[s/p ortho evaluation]; on meloxicam/robaxin- refilled.    STABLE.   # HTN- continue amlodipine/Lisinopril-systolic blood pressure 150s.  Monitor closely.  STABLE.   # Bone lesions: discussed zometa. Recommend ca [500]+vit D [1000 IU].  On Zometa q 4 weeks;   # CKD- stage III- GFR 48-50- STABLE; mild hypokalemia potassium 3.3-.   # Anxiety/depression- Celexa; recommend social worker referral.  ? Zometa q4 w; cycle#3-d-8  # DISPOSITION:  # send to ER; keep port accessed re: possible internal bleeding ; s/p recent liver biopsy  # HOLD chemo today  # Follow up with NP- 7/20- labs- cbc; HOLD tube; D-2 possible 1 unit PRBC    # follow up in 3 weeks--; MD; cbc/cmp;CEA/ca27-29- Dr.B        

## 2022-07-07 NOTE — Progress Notes (Signed)
[place] OFFICE PROGRESS NOTE  Patient Care Team: Pcp, No as PCP - General Byrnett, Forest Gleason, MD (General Surgery) Marden Noble, MD (Internal Medicine) [name], MD as Consulting Physician (Internal Medicine)   SUMMARY OF ONCOLOGIC HISTORY:  Oncology History Overview Note  # 2007- LEFT BREAST CA STAGE II [T2N12m; s/p mastec; Dr.Byrnett] ER-Pos/PR Neg; Her- 2 Neu- NEG; ACx4-Taxol x12; Femara; stopped May 2009-stopped follow up.  #JAN 2015- Post mastectomy Chest wall recurrence [chest wall mass bx-]; ER- 90%; PR- NEG; Her2 Neu- NEG; s/p excision [involving skeletal muscle/adipose tissue; clear margins]; CT July 2017- NED;   # Jan 2020- progression/ hilar- START abema [jan 14th]+ faslodex[jan 13th]; OCT-NOV 2022-PET scan shows progressive disease in the bones to liver.  Discontinue Abema+ Faslodex  # DEC 1st, 2022- START Xeloda 2w/1w; MARCH 10th, 2023-progressive disease-lung bone liver  # end of March 2023- taxol weekly; Taxol held since June 1st, 2023-neuropathy     Liver biopsy- 07/05/2022- -preliminary positive for breast cancer; ER positive.  Discussed with Dr. RReuel Derby [facility]   # DEC 2021- anemia/? CKD [stool ];#Ascending colon mass-incidentally noted on PET scan.  S/p  Dr. AVicente Males  colonoscpy DEC 2022- NEGATIVE; reviewed small adenomas.   # OSTEOPENIA [BMD- June 2016]  # Kidney cysts  DIAGNOSIS: LEFT BREAST CA  STAGE: IV- NED  ;GOALS: pallaitive      [place]  02/05/2014 Initial Diagnosis   Breast cancer metastasized [facility] skin ([facility]   Malignant neoplasm of overlapping sites of left breast in female, estrogen receptor positive ([place]  07/24/2016 Initial Diagnosis   Cancer of overlapping sites of left female breast ([place]   01/06/2019 - 08/05/2020 Chemotherapy   Patient is on Treatment Plan : BREAST Abemaciclib + Fulvestrant q28d     03/22/2022 -  Chemotherapy   Patient is on  Treatment Plan : BREAST Paclitaxel D1,8,15 q28d      INTERVAL HISTORY: Accompanied by daughter; ambulating with the patient.  A very pleasant 81 year old African-American female patient with above history of ER/PR positive breast cancer stage IV on Taxol single agent [currently on hold because of poor tolerance] is here for follow-up.  In the interim patient underwent a biopsy of her liver lesion 3 days ago.  Patient continues to feel poorly.  She feels worse after the biopsy.  More fatigue.  Denies any blood in stools or black or stools.  Continues to feel wobbly on her gait.  No falls. Otherwise no nausea no pain.  Review of Systems  Constitutional:  Positive for malaise/fatigue. Negative for chills, diaphoresis, fever and weight loss.  HENT:  Negative for nosebleeds and sore throat.   Eyes:  Negative for double vision.  Respiratory:  Negative for cough, hemoptysis, sputum production, shortness of breath and wheezing.   Cardiovascular:  Negative for chest pain, palpitations, orthopnea and leg swelling.  Gastrointestinal:  Negative for abdominal pain, blood in stool, constipation, diarrhea, heartburn, melena, nausea and vomiting.  Musculoskeletal:  Positive for back pain and joint pain.  Skin: Negative.  Negative for itching and rash.  Neurological:  Negative for tingling, focal weakness, weakness and headaches.  Endo/Heme/Allergies:  Does not bruise/bleed easily.  Psychiatric/Behavioral:  Negative for depression. The patient is not nervous/anxious and does not have insomnia.    PAST MEDICAL HISTORY :  Past Medical History:  Diagnosis Date   [place]neurysm ([place]   Arthritis    Cancer ([facility] 2007   diagnosed  01/07 left breast with simple mastectomu & sn bx. The pt had a 3.5cm tumor. Frozen section report on the 2 sn were negative. On permanent sections a 0.64m micrometastasis was identified. She was not felt to require a complete axillary dissection. She has completed her Tamoxifen  therapy and has been released from routine f/u with medical oncology service.   COPD (chronic obstructive pulmonary disease) (HCC)    Diabetes mellitus without complication ([facility]    Hyperlipidemia    Hypertension    since age 81  Malignant ne[place] of upper-outer quadrant of [place] breast [place] 12/[place]   Mastectomy site recurrence resected 01/26/2014, ER 90%, PR 0%, HER-2/neu nonamplified.   Other benign neoplasm of connective and other soft tissue of trunk, unspecified 2014   Personal history of colonic polyps    Personal history of tobacco use, presenting hazards to health    Special screening for malignant neoplasms, colon    Unspecified disorder of skin and subcutaneous tissue 02/19/2013   biopsy of skin over the sternum on the right breast showed hypertrophic scar,keloid formation.    PAST SURGICAL HISTORY :   Past Surgical History:  Procedure Laterality Date   ABDOMINAL HYSTERECTOMY  2004   total   BREAST BIOPSY Right January 26, 2014   Core biopsy for mild increase breast uptake on PET scan, usual ductal hyperplasia and stromal fibrosis   BREAST SURGERY Left 2007   mastectomy   chest wall mass  01/26/14   CHOLECYSTECTOMY  2007   COLONOSCOPY W/ POLYPECTOMY  2011   Dr. EBrynda Greathouse  COLONOSCOPY WITH PROPOFOL N/A 12/22/2021   Procedure: COLONOSCOPY WITH PROPOFOL;  Surgeon: AJonathon Bellows MD;  [facility];  Service: Gastroenterology;  Laterality: N/A;   EYE SURGERY Right    cataract surgery   head surgery  1991   IR IMAGING GUIDED PORT INSERTION  03/13/2022   MASTECTOMY Left 2007   PORT-A-CATH REMOVAL  2008   PORTACATH PLACEMENT  2007    FAMILY HISTORY :   Family History  Problem Relation Age of Onset   Cancer Other        ovarian,breast,colon cancers;relations not listed   Breast cancer Neg Hx     SOCIAL HISTORY:   Social History   Tobacco Use   Smoking status: Some Days    Packs/day: 1.00    Years: 50.00    Total pack years: 50.00    Types: Cigarettes    Smokeless tobacco: Never  Vaping Use   Vaping Use: Some days  Substance Use Topics   Alcohol use: No    Alcohol/week: 0.0 standard drinks of alcohol   Drug use: No    ALLERGIES:  is allergic to metoprolol.  MEDICATIONS:  Current Outpatient Medications  Medication Sig Dispense Refill   amLODipine (NORVASC) 10 MG tablet TAKE 1 TABLET(10 MG) BY MOUTH DAILY 90 tablet 1   atorvastatin (LIPITOR) 20 MG tablet Take 20 mg by mouth daily at 6 PM.     citalopram (CELEXA) 40 MG tablet TAKE 1 TABLET(40 MG) BY MOUTH DAILY 90 tablet 1   ergocalciferol (VITAMIN D2) 1.25 MG (50000 UT) capsule Take 1 capsule (50,000 Units total) by mouth once a week. 12 capsule 1   lidocaine-prilocaine (EMLA) cream Apply on the port. 30 -45 min  prior to port access. 30 g 3   lisinopril (ZESTRIL) 2.5 MG tablet TAKE 1 TABLET(2.5 MG) BY MOUTH DAILY 90 tablet 1   meloxicam (MOBIC) 15 MG tablet TAKE 1 TABLET(15 MG) BY  MOUTH DAILY 90 tablet 1   methocarbamol (ROBAXIN) 500 MG tablet Take 1 tablet (500 mg total) by mouth in the morning and at bedtime. 60 tablet 1   ondansetron (ZOFRAN) 8 MG tablet One pill every 8 hours as needed for nausea/vomitting. 40 tablet 1   predniSONE (DELTASONE) 20 MG tablet Take 1 tablet (20 mg total) by mouth daily with breakfast. 30 tablet 0   prochlorperazine (COMPAZINE) 10 MG tablet TAKE 1 TABLET(10 MG) BY MOUTH EVERY 6 HOURS AS NEEDED FOR NAUSEA OR VOMITING 40 tablet 1   No current facility-administered medications for this visit.    PHYSICAL EXAMINATION: ECOG PERFORMANCE STATUS: 0 - Asymptomatic  BP 107/60 (BP Location: Right Arm, Patient Position: Sitting)   Pulse 85   Temp 98.9 F (37.2 C)   Resp 18   Wt 140 lb (63.5 kg)   BMI 23.30 kg/m   Filed Weights   07/07/22 1314  Weight: 140 lb (63.5 kg)       Physical Exam HENT:     Head: Normocephalic and atraumatic.     Mouth/Throat:     Pharynx: No oropharyngeal exudate.  Eyes:     Pupils: Pupils are equal, round, and  reactive to light.  Cardiovascular:     Rate and Rhythm: Normal rate and regular rhythm.  Pulmonary:     Effort: No respiratory distress.     Breath sounds: No wheezing.  Abdominal:     General: Bowel sounds are normal. There is no distension.     Palpations: Abdomen is soft. There is no mass.     Tenderness: There is no abdominal tenderness. There is no guarding or rebound.  Musculoskeletal:        General: No tenderness. Normal range of motion.     Cervical back: Normal range of motion and neck supple.  Skin:    General: Skin is warm.  Neurological:     Mental Status: She is alert and oriented to person, place, and time.  Psychiatric:        Mood and Affect: Affect normal.    LABORATORY DATA:  I have reviewed the data as listed    Component Value Date/Time   NA 137 07/07/2022 1302   NA 139 05/30/2014 0127   K 3.3 (L) 07/07/2022 1302   K 3.7 05/30/2014 0127   CL 105 07/07/2022 1302   CL 106 05/30/2014 0127   CO2 24 07/07/2022 1302   CO2 25 05/30/2014 0127   GLUCOSE 135 (H) 07/07/2022 1302   GLUCOSE 136 (H) 05/30/2014 0127   BUN 16 07/07/2022 1302   BUN 19 (H) 05/30/2014 0127   CREATININE 1.01 (H) 07/07/2022 1302   CREATININE 0.98 05/30/2014 0127   CALCIUM 8.4 (L) 07/07/2022 1302   CALCIUM 9.0 05/30/2014 0127   PROT 7.2 07/07/2022 1302   PROT 7.8 05/08/2014 1131   ALBUMIN 3.5 07/07/2022 1302   ALBUMIN 3.7 05/08/2014 1131   AST 91 (H) 07/07/2022 1302   AST 20 05/08/2014 1131   ALT 12 07/07/2022 1302   ALT 18 05/08/2014 1131   ALKPHOS 119 07/07/2022 1302   ALKPHOS 58 05/08/2014 1131   BILITOT 0.4 07/07/2022 1302   BILITOT 0.4 05/08/2014 1131   GFRNONAA 57 (L) 07/07/2022 1302   GFRNONAA 58 (L) 05/30/2014 0127   GFRAA 47 (L) 09/02/2020 0920   GFRAA >60 05/30/2014 0127    No results found for: "SPEP", "UPEP"  Lab Results  Component Value Date   WBC 9.3 07/07/2022  NEUTROABS 6.1 07/07/2022   HGB 7.9 (L) 07/07/2022   HCT 25.3 (L) 07/07/2022   MCV 91.7  07/07/2022   PLT 272 07/07/2022      Chemistry      Component Value Date/Time   NA 137 07/07/2022 1302   NA 139 05/30/2014 0127   K 3.3 (L) 07/07/2022 1302   K 3.7 05/30/2014 0127   CL 105 07/07/2022 1302   CL 106 05/30/2014 0127   CO2 24 07/07/2022 1302   CO2 25 05/30/2014 0127   BUN 16 07/07/2022 1302   BUN 19 (H) 05/30/2014 0127   CREATININE 1.01 (H) 07/07/2022 1302   CREATININE 0.98 05/30/2014 0127      Component Value Date/Time   CALCIUM 8.4 (L) 07/07/2022 1302   CALCIUM 9.0 05/30/2014 0127   ALKPHOS 119 07/07/2022 1302   ALKPHOS 58 05/08/2014 1131   AST 91 (H) 07/07/2022 1302   AST 20 05/08/2014 1131   ALT 12 07/07/2022 1302   ALT 18 05/08/2014 1131   BILITOT 0.4 07/07/2022 1302   BILITOT 0.4 05/08/2014 1131         RADIOGRAPHIC STUDIES: I have personally reviewed the radiological images as listed and agreed with the findings in the report. No results found.   ASSESSMENT & PLAN:   Malignant neoplasm of overlapping sites of left breast in female, estrogen receptor positive ([place]) #Left breast ipsilateral chest wall recurrence stage IV [2015]-bone/liver metastases/lung nodules ; Currently on Taxol weekly. CT scan JUNE 18th, 2023- Multiple subpleural pulmonary nodules; subtly hypodense lesion of the anterior left lobe of the liver; Unchanged, widespread sclerotic osseous metastatic disease involving the axial skeleton; No evidence of new metastatic disease in the chest, abdomen, or pelvis.  CHECKED with UNABLE to run-ER/PR HER2/neu testing in 2007.  Liver biopsy- 7/12-preliminary positive for breast cancer; ER positive.  Discussed with Dr. Reuel Derby; will order her-2 [facility]; and NGS.  Consider alternative chemotherapy-based on patient's NGS results.  Recommend giving patient a chemotherapy break given overall poor tolerance.  #Continue HOLDING Taxol weekly cycle #3 day-15 today-given fall/neuropathy/worsening anemia  [see below].   JUNE 9th, 2023- 2D echo-EF  55-60%.    #Acute worsening of anemia-off chemotherapy; status post recent biopsy concerning for any intermittent bleeding.  Recommend urgent evaluation emergency room for CT scan.  Patient reluctantly agrees.  Also discussed need regarding need for possible blood transfusion in the emergency room.   #Worsening fatigue-multifactorial worsening anemia see above; also recommend starting patient on prednisone 20 mg once a day.  # Fall-appears mechanical/neuropathy.  Consider brain imaging given history of metastatic disease- STABLE.   # PN-2-3 sec to Taxol- monitor for now; hold chemotherapy.  Consider Neurontin. STABLE.   # Hypocalcemia-continue calcium plus vitamin D twice daily; vitamin D 25-OH- 16 [June 2023]; currently on Ergocaliferol 50,000 unit/weekly.   # Bilateral hip pain- arthritis-[s/p ortho evaluation]; on meloxicam/robaxin- refilled.    STABLE.   # HTN- continue amlodipine/Lisinopril-systolic blood pressure 163A.  Monitor closely.  STABLE.   # Bone lesions: discussed zometa. Recommend ca [500]+vit D [1000 IU]. On Zometa q 4 weeks;   # CKD- stage III- GFR 48-50- STABLE; mild hypokalemia potassium 3.3-.   # Anxiety/depression- Celexa; recommend social worker referral.  ? Zometa q4 w; cycle#3-d-8  # DISPOSITION:  # send to ER; keep port accessed re: possible internal bleeding ; s/p recent liver biopsy  # HOLD chemo today  # Follow up with NP- 7/20- labs- cbc; HOLD tube; D-2 possible 1 unit PRBC    #  follow up in 3 weeks--; MD; cbc/cmp;CEA/ca27-29- Dr.B             [name], MD 07/07/2022 2:09 PM

## 2022-07-07 NOTE — ED Notes (Signed)
Power  port was accessed prior to RN receiving pt

## 2022-07-07 NOTE — ED Triage Notes (Signed)
Patient to ER from [facility]. Reports she had labs drawn today and was advised to be sene in the ER due to her hgb being 7. Denies any bleeding or dark/ tarry stools. Reports that she had a liver biopsy done 3 days ago. Reports fatigue and unsteady gait.

## 2022-07-07 NOTE — Progress Notes (Signed)
Patient here for follow up. Reports decreased appetite and increased weakness.

## 2022-07-07 NOTE — H&P (Signed)
History and Physical   [facility] - [place] @ [facility] Admission History and Physical [employer], D.O.    Patient Name: Jessica Compton MR#: [id number] Date of Birth: [birth year] Date of Admission: 07/07/2022  Referring MD/NP/PA: Dr. Archie Balboa Primary Care Physician: Pcp, No  Chief Complaint:  Chief Complaint  Patient presents with   Post-op Problem    HPI: Jessica Compton is a 81 y.o. female with a known history of OA, BRCA, COPD, DM, HTN, HLD presents to the emergency department for evaluation of anemia.  Patient was in a usual state of health until when she developed weakness and is easily fatigued.  Of note, two days ago she underwent liver biopsy and she states that her symptoms have worsened since then. . Oncology referred her to ER for evaluation of anemia, with recent drop in H/H.  (HgB 3 weeks ago 10.4 --> 7.9 today)  Patient denies hematochezia, melena, hematemesis, fevers/chills, dizziness, chest pain, shortness of breath, N/V/C/D, abdominal pain, dysuria/frequency, changes in mental status.    Otherwise there has been no change in status. Patient has been taking medication as prescribed and there has been no recent change in medication or diet.  No recent antibiotics.  There has been no recent illness, hospitalizations, travel or sick contacts.    EMS/ED Course: Patient received Protonix bolus and drip.  Stool guaiac was found to be positive in ER Medical admission has been requested for further management of normocytic anemia likely 2/2 GIB  Review of Systems:  CONSTITUTIONAL: No fever/chills, Positive fatigue, weakness, negative weight gain/loss, headache. EYES: No blurry or double vision. ENT: No tinnitus, postnasal drip, redness or soreness of the oropharynx. RESPIRATORY: No cough, dyspnea, wheeze.  No hemoptysis.  CARDIOVASCULAR: No chest pain, palpitations, syncope, orthopnea. No lower extremity edema.  GASTROINTESTINAL: No nausea, vomiting, abdominal pain,  diarrhea, constipation.  No hematemesis, melena or hematochezia. GENITOURINARY: No dysuria, frequency, hematuria. ENDOCRINE: No polyuria or nocturia. No heat or cold intolerance. HEMATOLOGY: No anemia, bruising, bleeding. INTEGUMENTARY: No rashes, ulcers, lesions. MUSCULOSKELETAL: No arthritis, gout, dyspnea. NEUROLOGIC: No numbness, tingling, ataxia, seizure-type activity, weakness. PSYCHIATRIC: No anxiety, depression, insomnia.   Past Medical History:  Diagnosis Date   Aneurysm ([place]) 2007   Arthritis    Cancer ([place]) 2007   diagnosed 01/07 left breast with simple mastectomu & sn bx. The pt had a 3.5cm tumor. Frozen section report on the 2 sn were negative. On permanent sections a 0.44m micrometastasis was identified. She was not felt to require a complete axillary dissection. She has completed her Tamoxifen therapy and has been released from routine f/u with medical oncology service.   COPD (chronic obstructive pulmonary disease) (HCC)    Diabetes mellitus without complication ([place]    Hyperlipidemia    Hypertension    since age 81  [place]alignant [place] of [place]-outer quadrant of [place]   Mastectomy site recurrence resected 01/26/2014, ER 90%, PR 0%, HER-2/neu nonamplified.   Other benign neoplasm of connective and other soft tissue of trunk, unspecified 2014   Personal history of colonic polyps    Personal history of tobacco use, presenting hazards to health    Special screening for malignant neoplasms, colon    Unspecified disorder of skin and subcutaneous tissue 02/19/2013   biopsy of skin over the sternum on the right breast showed hypertrophic scar,keloid formation.    Past Surgical History:  Procedure Laterality Date   ABDOMINAL HYSTERECTOMY  2004   total   BREAST BIOPSY Right January 26, 2014   Core biopsy for mild increase breast uptake on PET scan, usual ductal hyperplasia and stromal fibrosis   BREAST SURGERY Left 2007   mastectomy   chest wall  mass  01/26/14   CHOLECYSTECTOMY  2007   COLONOSCOPY W/ POLYPECTOMY  2011   Dr. Brynda Greathouse   COLONOSCOPY WITH PROPOFOL N/A 12/22/2021   Procedure: COLONOSCOPY WITH PROPOFOL;  Surgeon: Jonathon Bellows, MD;  Location: [facility] ENDOSCOPY;  Service: Gastroenterology;  Laterality: N/A;   EYE SURGERY Right    cataract surgery   head surgery  1991   IR IMAGING GUIDED PORT INSERTION  03/13/2022   MASTECTOMY Left 2007   PORT-A-CATH REMOVAL  2008   PORTACATH PLACEMENT  2007     reports that she has been smoking cigarettes. She has a 50.00 pack-year smoking history. She has never used smokeless tobacco. She reports that she does not drink alcohol and does not use drugs.  Allergies  Allergen Reactions   Metoprolol     Dizziness and fatigue    Family History  Problem Relation Age of Onset   Cancer Other        ovarian,breast,colon cancers;relations not listed   Breast cancer Neg Hx     Prior to Admission medications   Medication Sig Start Date End Date Taking? Authorizing Provider  amLODipine (NORVASC) 10 MG tablet TAKE 1 TABLET(10 MG) BY MOUTH DAILY 09/12/21   [name], MD  atorvastatin (LIPITOR) 20 MG tablet Take 20 mg by mouth daily at 6 PM. 08/14/14   [provider]  citalopram (CELEXA) 40 MG tablet TAKE 1 TABLET(40 MG) BY MOUTH DAILY 02/27/22   [name], MD  ergocalciferol (VITAMIN D2) 1.25 MG (50000 UT) capsule Take 1 capsule (50,000 Units total) by mouth once a week. 06/15/22   [name], MD  lidocaine-prilocaine (EMLA) cream Apply on the port. 30 -45 min  prior to port access. 03/08/22   [name], MD  lisinopril (ZESTRIL) 2.5 MG tablet TAKE 1 TABLET(2.5 MG) BY MOUTH DAILY 07/04/22   [name], MD  meloxicam (MOBIC) 15 MG tablet TAKE 1 TABLET(15 MG) BY MOUTH DAILY 04/20/22   [name], MD  methocarbamol (ROBAXIN) 500 MG tablet Take 1 tablet (500 mg total) by mouth in the morning and at bedtime. 04/20/22   [name], MD  ondansetron (ZOFRAN) 8 MG tablet One pill every 8 hours as needed for nausea/vomitting. 03/08/22   [name], MD  predniSONE (DELTASONE) 20 MG tablet Take 1 tablet (20 mg total) by mouth daily with breakfast. 07/07/22   [name], MD  prochlorperazine (COMPAZINE) 10 MG tablet TAKE 1 TABLET(10 MG) BY MOUTH EVERY 6 HOURS AS NEEDED FOR NAUSEA OR VOMITING 03/27/22   Hughie Closs, PA-C    Physical Exam: Vitals:   07/07/22 1511 07/07/22 1519 07/07/22 1845  BP: 131/66  (!) 142/74  Pulse: 87  98  Resp: 17    Temp: 99.1 F (37.3 C)    TempSrc: Oral    SpO2: 96%  97%  Weight:  63.5 kg   Height:  5' 5"  (1.651 m)     GENERAL: 81 y.o.-year-old black female patient, well-developed, well-nourished lying in the bed in no acute distress.  Pleasant and cooperative.   HEENT: Head atraumatic, normocephalic. Pupils equal. Mucus membranes moist. NECK: Supple. No JVD. CHEST: Normal breath sounds bilaterally. No wheezing, rales, rhonchi or crackles. No use of accessory muscles of respiration.  No reproducible chest  wall tenderness.  CARDIOVASCULAR: S1, S2 normal. No murmurs, rubs, or gallops. Cap refill <2 seconds. Pulses intact distally.  ABDOMEN: Soft, nondistended, nontender. No rebound, guarding, rigidity. Normoactive bowel sounds present in all four quadrants.  EXTREMITIES: No pedal edema, cyanosis, or clubbing. No calf tenderness or Homan's sign.  NEUROLOGIC: The patient is alert and oriented x 3. Cranial nerves II through XII are grossly intact with no focal sensorimotor deficit. PSYCHIATRIC:  Normal affect, mood, thought content. SKIN: Warm, dry, and intact without obvious rash, lesion, or ulcer.    Labs on Admission:  CBC: Recent Labs  Lab 07/07/22 1302  WBC 9.3  NEUTROABS 6.1  HGB 7.9*  HCT 25.3*  MCV 91.7  PLT 505   Basic Metabolic Panel: Recent Labs  Lab 07/07/22 1302  NA 137  K 3.3*  CL 105  CO2 24  GLUCOSE 135*  BUN 16  CREATININE 1.01*   CALCIUM 8.4*   GFR: Estimated Creatinine Clearance: 40.6 mL/min (A) (by C-G formula based on SCr of 1.01 mg/dL (H)). Liver Function Tests: Recent Labs  Lab 07/07/22 1302  AST 91*  ALT 12  ALKPHOS 119  BILITOT 0.4  PROT 7.2  ALBUMIN 3.5   No results for input(s): "LIPASE", "AMYLASE" in the last 168 hours. No results for input(s): "AMMONIA" in the last 168 hours. Coagulation Profile: Recent Labs  Lab 07/05/22 1052  INR 1.1   Cardiac Enzymes: No results for input(s): "CKTOTAL", "CKMB", "CKMBINDEX", "TROPONINI" in the last 168 hours. BNP (last 3 results) No results for input(s): "PROBNP" in the last 8760 hours. HbA1C: No results for input(s): "HGBA1C" in the last 72 hours. CBG: Recent Labs  Lab 07/05/22 1054  GLUCAP 142*   Lipid Profile: No results for input(s): "CHOL", "HDL", "LDLCALC", "TRIG", "CHOLHDL", "LDLDIRECT" in the last 72 hours. Thyroid Function Tests: No results for input(s): "TSH", "T4TOTAL", "FREET4", "T3FREE", "THYROIDAB" in the last 72 hours. Anemia Panel: No results for input(s): "VITAMINB12", "FOLATE", "FERRITIN", "TIBC", "IRON", "RETICCTPCT" in the last 72 hours. Urine analysis:    Component Value Date/Time   COLORURINE YELLOW (A) 12/27/2020 0858   APPEARANCEUR TURBID (A) 12/27/2020 0858   LABSPEC 1.011 12/27/2020 0858   PHURINE 5.0 12/27/2020 0858   GLUCOSEU NEGATIVE 12/27/2020 0858   HGBUR SMALL (A) 12/27/2020 0858   BILIRUBINUR NEGATIVE 12/27/2020 0858   KETONESUR NEGATIVE 12/27/2020 0858   PROTEINUR 30 (A) 12/27/2020 0858   NITRITE POSITIVE (A) 12/27/2020 0858   LEUKOCYTESUR LARGE (A) 12/27/2020 0858   Sepsis Labs: @LABRCNTIP (procalcitonin:4,lacticidven:4) )No results found for this or any previous visit (from the past 240 hour(s)).   Radiological Exams on Admission: CT ABDOMEN PELVIS W CONTRAST  Result Date: 07/07/2022 CLINICAL DATA:  Recent liver biopsy, anemia EXAM: CT ABDOMEN AND PELVIS WITH CONTRAST TECHNIQUE: Multidetector CT  imaging of the abdomen and pelvis was performed using the standard protocol following bolus administration of intravenous contrast. RADIATION DOSE REDUCTION: This exam was performed according to the departmental dose-optimization program which includes automated exposure control, adjustment of the mA and/or kV according to patient size and/or use of iterative reconstruction technique. CONTRAST:  127m OMNIPAQUE IOHEXOL 300 MG/ML  SOLN COMPARISON:  06/09/2022 FINDINGS: Lower chest: Small nodular densities and pleural-based densities in the lower lung fields appear stable. There is previous left mastectomy. Coronary artery calcifications are seen. Hepatobiliary: In image 23 of series 2, there is 2 cm low-density in the anterior aspect of the left lobe. There is dilation of bile ducts, possibly related to previous cholecystectomy. This finding appears  stable. Pancreas: No new focal abnormalities are seen. Spleen: Spleen is smaller than usual in size. Adrenals/Urinary Tract: Adrenals are unremarkable. There is no hydronephrosis. There are a few low-density lesions in the renal cortex largest measuring 2.5 cm in the lower pole of left kidney, possibly cysts. There are no renal or ureteral stones. 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 Urinary bladder is unremarkable. Stomach/Bowel: Stomach is not distended. Small bowel loops are not dilated. The appendix is not dilated. Multiple diverticula are seen in colon without signs of focal acute diverticulitis. Vascular/Lymphatic: Atherosclerotic plaques and calcifications are seen in aorta. There is ectasia of the infrarenal aorta measuring 2.5 cm. Reproductive: Uterus is not seen.  There are no adnexal masses. Other: There is no ascites or pneumoperitoneum. Musculoskeletal: There are patchy sclerotic lesions in the bony structures with no significant interval change suggesting metastatic disease. There is minimal anterolisthesis at the L4-L5 level.  IMPRESSION: There is no evidence of intestinal obstruction or pneumoperitoneum. There is no hydronephrosis. Appendix is not dilated. Diverticulosis of colon without signs of focal diverticulitis. There are scattered sclerotic lesions in the bony structures and 2 cm lesion in the left lobe of liver suggesting metastatic disease with no significant change. Coronary artery disease. Other findings as described in the body of the report. Electronically Signed   By: Elmer Picker M.D.   On: 07/07/2022 19:58    EKG: Normal sinus rhythm at 76 bpm with normal axis and nonspecific ST-T wave changes.   Assessment/Plan  This is a 81 y.o. female with a history of OA, BRCA, COPD, DM, HTN, HLD now being admitted with:  #. Normocytic anemia 2/2 GI Bleed - Admit inpatient -IV Protonix -Serial CBCs -Nothing by mouth -IV fluid hydration -Hold anticoagulants, Mobic - Transfuse two units pRBCs -Please consult GI in AM  #. Mild hypokalemia - Replace orally  #. History of HTN - Continue Norvasc, lisinopril  #. History of CKD, stable at baseline - Continue monitor BMP  #. History of HLD - Continue Lipitor  Admission status: Inpatient IV Fluids: NS Diet/Nutrition: NPO Consults called: GI  DVT Px: SCDs and early ambulation. Code Status: Full Code  Disposition Plan: To home in 1-2 days  All the records are reviewed and case discussed with ED provider. Management plans discussed with the patient and/or family who express understanding and agree with plan of care.  Tommi Crepeau D.O. on 07/07/2022 at 9:02 PM CC: Primary care physician; Pcp, No   07/07/2022, 9:02 PM

## 2022-07-08 ENCOUNTER — Other Ambulatory Visit: Payer: Self-pay

## 2022-07-08 ENCOUNTER — Inpatient Hospital Stay: Payer: Medicare HMO | Admitting: Anesthesiology

## 2022-07-08 ENCOUNTER — Encounter
Admission: EM | Disposition: A | Discharge status: Home / Self Care | Payer: Self-pay | Source: Home / Self Care | Attending: General Practice

## 2022-07-08 ENCOUNTER — Encounter: Payer: Self-pay | Admitting: Family Medicine

## 2022-07-08 HISTORY — PX: ESOPHAGOGASTRODUODENOSCOPY (EGD) WITH PROPOFOL: SHX5813

## 2022-07-08 LAB — CBC
HCT: 33.6 % — ABNORMAL LOW (ref 36.0–46.0)
HCT: 34.6 % — ABNORMAL LOW (ref 36.0–46.0)
HCT: 33.5 % — ABNORMAL LOW (ref 36.0–46.0)
Hemoglobin: 11 g/dL — ABNORMAL LOW (ref 12.0–15.0)
Hemoglobin: 11.2 g/dL — ABNORMAL LOW (ref 12.0–15.0)
Hemoglobin: 10.8 g/dL — ABNORMAL LOW (ref 12.0–15.0)
MCH: 28.9 pg (ref 26.0–34.0)
MCH: 28.6 pg (ref 26.0–34.0)
MCH: 28.3 pg (ref 26.0–34.0)
MCHC: 32.7 g/dL (ref 30.0–36.0)
MCHC: 32.4 g/dL (ref 30.0–36.0)
MCHC: 32.2 g/dL (ref 30.0–36.0)
MCV: 88.2 fL (ref 80.0–100.0)
MCV: 88.5 fL (ref 80.0–100.0)
MCV: 87.7 fL (ref 80.0–100.0)
Platelets: 233 10*3/uL (ref 150–400)
Platelets: 232 10*3/uL (ref 150–400)
Platelets: 228 10*3/uL (ref 150–400)
RBC: 3.81 MIL/uL — ABNORMAL LOW (ref 3.87–5.11)
RBC: 3.91 MIL/uL (ref 3.87–5.11)
RBC: 3.82 MIL/uL — ABNORMAL LOW (ref 3.87–5.11)
RDW: 15.3 % (ref 11.5–15.5)
RDW: 15.3 % (ref 11.5–15.5)
RDW: 15.4 % (ref 11.5–15.5)
WBC: 8.3 10*3/uL (ref 4.0–10.5)
WBC: 8.4 10*3/uL (ref 4.0–10.5)
WBC: 8.6 10*3/uL (ref 4.0–10.5)
nRBC: 0 % (ref 0.0–0.2)
nRBC: 0 % (ref 0.0–0.2)
nRBC: 0 % (ref 0.0–0.2)

## 2022-07-08 LAB — BASIC METABOLIC PANEL
Anion gap: 7 (ref 5–15)
BUN: 9 mg/dL (ref 8–23)
CO2: 23 mmol/L (ref 22–32)
Calcium: 9.1 mg/dL (ref 8.9–10.3)
Chloride: 108 mmol/L (ref 98–111)
Creatinine, Ser: 0.88 mg/dL (ref 0.44–1.00)
GFR, Estimated: >60 mL/min (ref >60)
Glucose, Bld: 93 mg/dL (ref 70–99)
Potassium: 3.8 mmol/L (ref 3.5–5.1)
Sodium: 138 mmol/L (ref 135–145)

## 2022-07-08 LAB — GLUCOSE, CAPILLARY
Glucose-Capillary: 101 mg/dL — ABNORMAL HIGH (ref 70–99)
Glucose-Capillary: 127 mg/dL — ABNORMAL HIGH (ref 70–99)
Glucose-Capillary: 128 mg/dL — ABNORMAL HIGH (ref 70–99)
Glucose-Capillary: 169 mg/dL — ABNORMAL HIGH (ref 70–99)
Glucose-Capillary: 88 mg/dL (ref 70–99)

## 2022-07-08 LAB — APTT: aPTT: 24 seconds (ref 24–36)

## 2022-07-08 LAB — PROTIME-INR
INR: 1 (ref 0.8–1.2)
Prothrombin Time: 13 seconds (ref 11.4–15.2)

## 2022-07-08 LAB — LACTATE DEHYDROGENASE: LDH: 404 U/L — ABNORMAL HIGH (ref 98–192)

## 2022-07-08 LAB — HEMOGLOBIN A1C
Hgb A1c MFr Bld: 6.7 % — ABNORMAL HIGH (ref 4.8–5.6)
Mean Plasma Glucose: 145.59 mg/dL

## 2022-07-08 LAB — CANCER ANTIGEN 27.29: CA 27.29: 268.6 U/mL — ABNORMAL HIGH (ref 0.0–38.6)

## 2022-07-08 LAB — CEA: CEA: 13.7 ng/mL — ABNORMAL HIGH (ref 0.0–4.7)

## 2022-07-08 SURGERY — ESOPHAGOGASTRODUODENOSCOPY (EGD) WITH PROPOFOL
Anesthesia: General

## 2022-07-08 MED ORDER — LIDOCAINE HCL (PF) 2 % IJ SOLN
INTRAMUSCULAR | Status: AC
  Filled 2022-07-08: qty 5

## 2022-07-08 MED ORDER — PROPOFOL 10 MG/ML IV BOLUS
INTRAVENOUS | Status: DC | PRN
  Administered 2022-07-08: 80 mg via INTRAVENOUS
  Administered 2022-07-08: 30 mg via INTRAVENOUS

## 2022-07-08 MED ORDER — HEPARIN SOD (PORK) LOCK FLUSH 100 UNIT/ML IV SOLN
500.0000 [IU] | Freq: Once | INTRAVENOUS | Status: AC
  Administered 2022-07-08: 500 [IU] via INTRAVENOUS
  Filled 2022-07-08: qty 5

## 2022-07-08 MED ORDER — PROPOFOL 10 MG/ML IV BOLUS
INTRAVENOUS | Status: AC
  Filled 2022-07-08: qty 40

## 2022-07-08 MED ORDER — LIDOCAINE HCL (CARDIAC) PF 100 MG/5ML IV SOSY
PREFILLED_SYRINGE | INTRAVENOUS | Status: DC | PRN
  Administered 2022-07-08: 80 mg via INTRAVENOUS

## 2022-07-08 MED ORDER — OMEPRAZOLE MAGNESIUM 20 MG PO TBEC
40.0000 mg | DELAYED_RELEASE_TABLET | Freq: Every day | ORAL | 0 refills | Status: AC
Start: 2022-07-08 — End: 2022-10-19

## 2022-07-08 NOTE — Care Plan (Signed)
EGD unremarkable. Blood on rectal exam likely from anal outlet. Recommend bowel regimen and PPI long term if she is going to take NSAIDS. No plan for repeat colonoscopy given she had a good quality exam 6 months ago.  Raylene Miyamoto MD, MPH [place]

## 2022-07-08 NOTE — Anesthesia Preprocedure Evaluation (Addendum)
Anesthesia Evaluation  Patient identified by MRN, date of birth, ID band Patient awake    Reviewed: Allergy & Precautions, NPO status , Patient's Chart, lab work & pertinent test results  History of Anesthesia Complications Negative for: history of anesthetic complications  Airway Mallampati: IV   Neck ROM: Full    Dental  (+) Edentulous Upper, Edentulous Lower   Pulmonary COPD, Current Smoker and Patient abstained from smoking.,    Pulmonary exam normal breath sounds clear to auscultation       Cardiovascular Exercise Tolerance: Poor hypertension, Normal cardiovascular exam Rhythm:Regular Rate:Normal  ECG 11/30/21:  Normal sinus rhythm Septal infarct , age undetermined  Coronary calcifications noted on CT scan   Neuro/Psych Hx cerebral aneurysm s/p surgery 1981    GI/Hepatic negative GI ROS,   Endo/Other  diabetes, Type 2  Renal/GU Renal InsufficiencyRenal disease     Musculoskeletal  (+) Arthritis ,   Abdominal Normal abdominal exam  (+)   Peds  Hematology Breast CA   Anesthesia Other Findings GI bleed 81 y.o. lady with metastatic breast cancer stage IV who follows with oncology who presents with worsening fatigue and general malaise after a liver biopsy last week. S/p 2 u pRBCs  Reproductive/Obstetrics                           Anesthesia Physical  Anesthesia Plan  ASA: 3  Anesthesia Plan: General   Post-op Pain Management:    Induction: Intravenous  PONV Risk Score and Plan: 2 and Propofol infusion, TIVA and Treatment may vary due to age or medical condition  Airway Management Planned: Natural Airway  Additional Equipment:   Intra-op Plan:   Post-operative Plan:   Informed Consent: I have reviewed the patients History and Physical, chart, labs and discussed the procedure including the risks, benefits and alternatives for the proposed anesthesia with the patient or  authorized representative who has indicated his/her understanding and acceptance.       Plan Discussed with: CRNA  Anesthesia Plan Comments:        Anesthesia Quick Evaluation

## 2022-07-08 NOTE — Discharge Summary (Signed)
Physician Discharge Summary   Patient: Jessica Compton MRN: [id number] DOB: May 20, 1941  Admit date:     07/07/2022  Discharge date: 07/08/22  Discharge Physician: Vanna Scotland   PCP: Pcp, No   Discharge Diagnoses: Principal Problem:   GI bleed  Resolved Problems:   * No resolved hospital problems. *  Hospital Course: 81 y.o. female with a known history of OA, BRCA, COPD, DM, HTN, HLD presents to the emergency department for evaluation of anemia.  Patient was in a usual state of health until when she developed weakness and is easily fatigued.  Of note, two days ago she underwent liver biopsy and she states that her symptoms have worsened since then. . Oncology referred her to ER for evaluation of anemia, with recent drop in H/H.  (HgB 3 weeks ago 10.4 --> 7.9 today)  #. Normocytic anemia 2/2 GI Bleed Oral PPI per GI EGD 7/15- completely normal per GI, as such no biopsies taken. Given NORMAL colonoscopy 6 months ago per GI, likely patient had GIB/ABLA secondary to hemorrhoids. She is currently hemodynamically stable, tolerating regular diet. Her weakness has resolved. She is interested in going home with close follow up in the outpatient setting.  S/p 2 u pRBCs, Hb 11    #. Mild hypokalemia - Replace orally   #. History of HTN - Continue Norvasc, lisinopril   #. History of CKD, stable at baseline - Continue monitor BMP   #. History of HLD - Continue Lipitor --    Assessment and Plan: No notes have been filed under this hospital service. Service: Hospitalist        Consultants: GI Procedures performed: EGD- NORMAL per GI notes Disposition: Home Diet recommendation:  Discharge Diet Orders (From admission, onward)     Start     Ordered   07/08/22 0000  Diet - low sodium heart healthy        07/08/22 1633           Carb modified diet DISCHARGE MEDICATION: Allergies as of 07/08/2022       Reactions   Metoprolol    Dizziness and fatigue        Medication  List     STOP taking these medications    predniSONE 20 MG tablet Commonly known as: DELTASONE       TAKE these medications    amLODipine 10 MG tablet Commonly known as: NORVASC TAKE 1 TABLET(10 MG) BY MOUTH DAILY   atorvastatin 20 MG tablet Commonly known as: LIPITOR Take 20 mg by mouth daily at 6 PM.   citalopram 40 MG tablet Commonly known as: CELEXA TAKE 1 TABLET(40 MG) BY MOUTH DAILY   ergocalciferol 1.25 MG (50000 UT) capsule Commonly known as: VITAMIN D2 Take 1 capsule (50,000 Units total) by mouth once a week.   lidocaine-prilocaine cream Commonly known as: EMLA Apply on the port. 30 -45 min  prior to port access.   lisinopril 2.5 MG tablet Commonly known as: ZESTRIL TAKE 1 TABLET(2.5 MG) BY MOUTH DAILY   meloxicam 15 MG tablet Commonly known as: MOBIC TAKE 1 TABLET(15 MG) BY MOUTH DAILY   methocarbamol 500 MG tablet Commonly known as: ROBAXIN Take 1 tablet (500 mg total) by mouth in the morning and at bedtime.   omeprazole 20 MG tablet Commonly known as: PriLOSEC OTC Take 2 tablets (40 mg total) by mouth daily.   ondansetron 8 MG tablet Commonly known as: ZOFRAN One pill every 8 hours as needed for nausea/vomitting.  prochlorperazine 10 MG tablet Commonly known as: COMPAZINE TAKE 1 TABLET(10 MG) BY MOUTH EVERY 6 HOURS AS NEEDED FOR NAUSEA OR VOMITING        Follow-up Information     Lesly Rubenstein, MD Follow up in 2 week(s).   Specialty: Gastroenterology Contact information: [place] [address] [phone]                Discharge Exam: Danley Danker Weights   07/07/22 1519 07/08/22 1443  Weight: 63.5 kg 63.5 kg    GENERAL: 81 y.o.-year-old black female patient, well-developed, well-nourished lying in the bed in no acute distress.  Pleasant and cooperative.   HEENT: Head atraumatic, normocephalic. Pupils equal. Mucus membranes moist. NECK: Supple. No JVD. CHEST: Normal breath sounds bilaterally. No  wheezing, rales, rhonchi or crackles. No use of accessory muscles of respiration.  No reproducible chest wall tenderness.  CARDIOVASCULAR: S1, S2 normal. No murmurs, rubs, or gallops. Cap refill <2 seconds. Pulses intact distally.  ABDOMEN: Soft, nondistended, nontender. No rebound, guarding, rigidity. Normoactive bowel sounds present in all four quadrants.  EXTREMITIES: No pedal edema, cyanosis, or clubbing. No calf tenderness or Homan's sign.  NEUROLOGIC: The patient is alert and oriented x 3. Cranial nerves II through XII are grossly intact with no focal sensorimotor deficit. PSYCHIATRIC:  Normal affect, mood, thought content. SKIN: Warm, dry, and intact without obvious rash, lesion, or ulcer. Condition at discharge: good  The results of significant diagnostics from this hospitalization (including imaging, microbiology, ancillary and laboratory) are listed below for reference.   Imaging Studies: CT ABDOMEN PELVIS W CONTRAST  Result Date: 07/07/2022 CLINICAL DATA:  Recent liver biopsy, anemia EXAM: CT ABDOMEN AND PELVIS WITH CONTRAST TECHNIQUE: Multidetector CT imaging of the abdomen and pelvis was performed using the standard protocol following bolus administration of intravenous contrast. RADIATION DOSE REDUCTION: This exam was performed according to the departmental dose-optimization program which includes automated exposure control, adjustment of the mA and/or kV according to patient size and/or use of iterative reconstruction technique. CONTRAST:  160m OMNIPAQUE IOHEXOL 300 MG/ML  SOLN COMPARISON:  06/09/2022 FINDINGS: Lower chest: Small nodular densities and pleural-based densities in the lower lung fields appear stable. There is previous left mastectomy. Coronary artery calcifications are seen. Hepatobiliary: In image 23 of series 2, there is 2 cm low-density in the anterior aspect of the left lobe. There is dilation of bile ducts, possibly related to previous cholecystectomy. This finding  appears stable. Pancreas: No new focal abnormalities are seen. Spleen: Spleen is smaller than usual in size. Adrenals/Urinary Tract: Adrenals are unremarkable. There is no hydronephrosis. There are a few low-density lesions in the renal cortex largest measuring 2.5 cm in the lower pole of left kidney, possibly cysts. There are no renal or ureteral stones. 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 Urinary bladder is unremarkable. Stomach/Bowel: Stomach is not distended. Small bowel loops are not dilated. The appendix is not dilated. Multiple diverticula are seen in colon without signs of focal acute diverticulitis. Vascular/Lymphatic: Atherosclerotic plaques and calcifications are seen in aorta. There is ectasia of the infrarenal aorta measuring 2.5 cm. Reproductive: Uterus is not seen.  There are no adnexal masses. Other: There is no ascites or pneumoperitoneum. Musculoskeletal: There are patchy sclerotic lesions in the bony structures with no significant interval change suggesting metastatic disease. There is minimal anterolisthesis at the L4-L5 level. IMPRESSION: There is no evidence of intestinal obstruction or pneumoperitoneum. There is no hydronephrosis. Appendix is not dilated. Diverticulosis  of colon without signs of focal diverticulitis. There are scattered sclerotic lesions in the bony structures and 2 cm lesion in the left lobe of liver suggesting metastatic disease with no significant change. Coronary artery disease. Other findings as described in the body of the report. Electronically Signed   By: Elmer Picker M.D.   On: 07/07/2022 19:58   US LIVER W/CM 1ST LESION  Result Date: 07/06/2022 INDICATION: Briefly, 81 year old female with a history of breast cancer and hypermetabolic small LEFT hepatic mass EXAM: Procedures: 1. ULTRASOUND ABDOMEN LIMITED RIGHT UPPER QUADRANT 2. US LIVER WITH CONTRAST 3. ULTRASOUND GUIDED LIVER LESION BIOPSY COMPARISON:  PET-CT, 10/31/2021.  CT  CAP, 06/09/2022. MEDICATIONS: None ANESTHESIA/SEDATION: Moderate (conscious) sedation was employed during this procedure. A total of Versed 0.5 mg and Fentanyl 25 mcg was administered intravenously. Moderate Sedation Time: 57 minutes. The patient's level of consciousness and vital signs were monitored continuously by radiology nursing throughout the procedure under my direct supervision. COMPLICATIONS: None immediate. CONTRAST:  Ultrasound contrast agent. LUMASON (sulfur hexafluoride lipid-type A microspheres) Number of injections: 3 COMPARISON:  None Available. FINDINGS: RIGHT UPPER QUADRANT ULTRASOUND; Gallbladder: Surgically absent Common bile duct: Diameter: 1.5 cm, likely physiologic post cholecystectomy distention Liver: Increased hepatic parenchymal echogenicity. Portal vein is patent on color Doppler imaging with normal direction of blood flow towards the liver. Multifocal hypodense hepatic masses, largest at the LEFT hepatic lobe measuring approximately 3.6 x 1.5 x 3.7 cm. Additional smaller rounded hypoechoic masses are present including; Paired masses at the LEFT hepatic lobe measuring 1.0 x 0.9 x 1.0 cm and 0.8 x 0.7 x 0.8 cm RIGHT hepatic lobe masses measuring 1.0 x 0.8 x 0.9 cm and 1.4 x 1.1 x 0.9 cm. Other: No perihepatic ascites LIVER ULTRASOUND WITH CONTRAST; Observation 1 Lobe: LEFT hepatic lobe (approximately at hepatic segment IVb) Size: 3.6 x 1.5 x 3.7 cm Arterial phase enhancement features: Diffuse Onset of washout: 28 seconds Degree of washout: Marked Tumor in vein: No CEUS LI-RADS category: CEUS LR-M PROCEDURE: LIVER MASS BIOPSY WITH CONTRAST; Informed written consent was obtained from the patient and/or patient's representative after a discussion of the risks, benefits and alternatives to treatment. The patient understands and consents the procedure. A timeout was performed prior to the initiation of the procedure. Ultrasound scanning was performed of the right upper abdominal quadrant  demonstrates ill-defined hypoechoic LEFT hepatic lobe mass measuring approximately 3.6 x 1.5 x 3.7 cm consistent with lesion as seen on PET-CT The LEFT hepatic lobe mass was selected for biopsy and the procedure was planned. The RIGHT upper abdominal quadrant was prepped and draped in the usual sterile fashion. The overlying soft tissues were anesthetized with 1% lidocaine with epinephrine. A 17 gauge, 6.8 cm co-axial needle was advanced into a peripheral aspect of the lesion. This was followed by 4 core biopsies with an 18 gauge core device under direct ultrasound guidance. The coaxial needle tract was embolized with a small amount of Gel-Foam slurry and superficial hemostasis was obtained with manual compression. Post procedural scanning was negative for definitive area of hemorrhage or additional complication. A dressing was placed. The patient tolerated the procedure well without immediate post procedural complication. FINDINGS: *Extrahepatic biliary ductal dilation likely post cholecystectomy physiologic distention. *Multifocal hepatic masses, as described above. *Target lesion at the LEFT hepatic lobe measuring up to 3.7 cm and demonstrating arterial phase enhancement (APHE) marked early washout (28 seconds) consistent with CEUS LR-M lesion. *Contrast-enhanced ultrasound-guided liver lesion targeted biopsy without complication. IMPRESSION: 1. Multifocal hepatic masses,  with 3.7 cm LEFT hepatic lobe (segment IVb) mass with characteristics consistent with CEUS LR-M lesion. 2. Successful contrast-enhanced, ultrasound-guided targeted core needle biopsy of LEFT hepatic lobe mass, as above. Michaelle Birks, MD [facility] Radiology Electronically Signed   By: Michaelle Birks M.D.   On: 07/06/2022 10:05   US Abdomen Limited RUQ (LIVER/GB)  Result Date: 07/06/2022 INDICATION: Briefly, 81 year old female with a history of breast cancer and hypermetabolic small LEFT hepatic  mass EXAM: Procedures: 1. ULTRASOUND ABDOMEN LIMITED RIGHT UPPER QUADRANT 2. US LIVER WITH CONTRAST 3. ULTRASOUND GUIDED LIVER LESION BIOPSY COMPARISON:  PET-CT, 10/31/2021.  CT CAP, 06/09/2022. MEDICATIONS: None ANESTHESIA/SEDATION: Moderate (conscious) sedation was employed during this procedure. A total of Versed 0.5 mg and Fentanyl 25 mcg was administered intravenously. Moderate Sedation Time: 57 minutes. The patient's level of consciousness and vital signs were monitored continuously by radiology nursing throughout the procedure under my direct supervision. COMPLICATIONS: None immediate. CONTRAST:  Ultrasound contrast agent. LUMASON (sulfur hexafluoride lipid-type A microspheres) Number of injections: 3 COMPARISON:  None Available. FINDINGS: RIGHT UPPER QUADRANT ULTRASOUND; Gallbladder: Surgically absent Common bile duct: Diameter: 1.5 cm, likely physiologic post cholecystectomy distention Liver: Increased hepatic parenchymal echogenicity. Portal vein is patent on color Doppler imaging with normal direction of blood flow towards the liver. Multifocal hypodense hepatic masses, largest at the LEFT hepatic lobe measuring approximately 3.6 x 1.5 x 3.7 cm. Additional smaller rounded hypoechoic masses are present including; Paired masses at the LEFT hepatic lobe measuring 1.0 x 0.9 x 1.0 cm and 0.8 x 0.7 x 0.8 cm RIGHT hepatic lobe masses measuring 1.0 x 0.8 x 0.9 cm and 1.4 x 1.1 x 0.9 cm. Other: No perihepatic ascites LIVER ULTRASOUND WITH CONTRAST; Observation 1 Lobe: LEFT hepatic lobe (approximately at hepatic segment IVb) Size: 3.6 x 1.5 x 3.7 cm Arterial phase enhancement features: Diffuse Onset of washout: 28 seconds Degree of washout: Marked Tumor in vein: No CEUS LI-RADS category: CEUS LR-M PROCEDURE: LIVER MASS BIOPSY WITH CONTRAST; Informed written consent was obtained from the patient and/or patient's representative after a discussion of the risks, benefits and alternatives to treatment. The patient  understands and consents the procedure. A timeout was performed prior to the initiation of the procedure. Ultrasound scanning was performed of the right upper abdominal quadrant demonstrates ill-defined hypoechoic LEFT hepatic lobe mass measuring approximately 3.6 x 1.5 x 3.7 cm consistent with lesion as seen on PET-CT The LEFT hepatic lobe mass was selected for biopsy and the procedure was planned. The RIGHT upper abdominal quadrant was prepped and draped in the usual sterile fashion. The overlying soft tissues were anesthetized with 1% lidocaine with epinephrine. A 17 gauge, 6.8 cm co-axial needle was advanced into a peripheral aspect of the lesion. This was followed by 4 core biopsies with an 18 gauge core device under direct ultrasound guidance. The coaxial needle tract was embolized with a small amount of Gel-Foam slurry and superficial hemostasis was obtained with manual compression. Post procedural scanning was negative for definitive area of hemorrhage or additional complication. A dressing was placed. The patient tolerated the procedure well without immediate post procedural complication. FINDINGS: *Extrahepatic biliary ductal dilation likely post cholecystectomy physiologic distention. *Multifocal hepatic masses, as described above. *Target lesion at the LEFT hepatic lobe measuring up to 3.7 cm and demonstrating arterial phase enhancement (APHE) marked early washout (28 seconds) consistent with CEUS LR-M lesion. *Contrast-enhanced ultrasound-guided liver lesion targeted biopsy without complication. IMPRESSION: 1. Multifocal hepatic masses, with 3.7 cm LEFT hepatic  lobe (segment IVb) mass with characteristics consistent with CEUS LR-M lesion. 2. Successful contrast-enhanced, ultrasound-guided targeted core needle biopsy of LEFT hepatic lobe mass, as above. Michaelle Birks, MD [facility] Radiology Electronically Signed   By: Michaelle Birks M.D.   On: 07/06/2022  10:05   US BIOPSY (LIVER)  Result Date: 07/06/2022 INDICATION: Briefly, 81 year old female with a history of breast cancer and hypermetabolic small LEFT hepatic mass EXAM: Procedures: 1. ULTRASOUND ABDOMEN LIMITED RIGHT UPPER QUADRANT 2. US LIVER WITH CONTRAST 3. ULTRASOUND GUIDED LIVER LESION BIOPSY COMPARISON:  PET-CT, 10/31/2021.  CT CAP, 06/09/2022. MEDICATIONS: None ANESTHESIA/SEDATION: Moderate (conscious) sedation was employed during this procedure. A total of Versed 0.5 mg and Fentanyl 25 mcg was administered intravenously. Moderate Sedation Time: 57 minutes. The patient's level of consciousness and vital signs were monitored continuously by radiology nursing throughout the procedure under my direct supervision. COMPLICATIONS: None immediate. CONTRAST:  Ultrasound contrast agent. LUMASON (sulfur hexafluoride lipid-type A microspheres) Number of injections: 3 COMPARISON:  None Available. FINDINGS: RIGHT UPPER QUADRANT ULTRASOUND; Gallbladder: Surgically absent Common bile duct: Diameter: 1.5 cm, likely physiologic post cholecystectomy distention Liver: Increased hepatic parenchymal echogenicity. Portal vein is patent on color Doppler imaging with normal direction of blood flow towards the liver. Multifocal hypodense hepatic masses, largest at the LEFT hepatic lobe measuring approximately 3.6 x 1.5 x 3.7 cm. Additional smaller rounded hypoechoic masses are present including; Paired masses at the LEFT hepatic lobe measuring 1.0 x 0.9 x 1.0 cm and 0.8 x 0.7 x 0.8 cm RIGHT hepatic lobe masses measuring 1.0 x 0.8 x 0.9 cm and 1.4 x 1.1 x 0.9 cm. Other: No perihepatic ascites LIVER ULTRASOUND WITH CONTRAST; Observation 1 Lobe: LEFT hepatic lobe (approximately at hepatic segment IVb) Size: 3.6 x 1.5 x 3.7 cm Arterial phase enhancement features: Diffuse Onset of washout: 28 seconds Degree of washout: Marked Tumor in vein: No CEUS LI-RADS category: CEUS LR-M PROCEDURE: LIVER MASS BIOPSY WITH CONTRAST; Informed  written consent was obtained from the patient and/or patient's representative after a discussion of the risks, benefits and alternatives to treatment. The patient understands and consents the procedure. A timeout was performed prior to the initiation of the procedure. Ultrasound scanning was performed of the right upper abdominal quadrant demonstrates ill-defined hypoechoic LEFT hepatic lobe mass measuring approximately 3.6 x 1.5 x 3.7 cm consistent with lesion as seen on PET-CT The LEFT hepatic lobe mass was selected for biopsy and the procedure was planned. The RIGHT upper abdominal quadrant was prepped and draped in the usual sterile fashion. The overlying soft tissues were anesthetized with 1% lidocaine with epinephrine. A 17 gauge, 6.8 cm co-axial needle was advanced into a peripheral aspect of the lesion. This was followed by 4 core biopsies with an 18 gauge core device under direct ultrasound guidance. The coaxial needle tract was embolized with a small amount of Gel-Foam slurry and superficial hemostasis was obtained with manual compression. Post procedural scanning was negative for definitive area of hemorrhage or additional complication. A dressing was placed. The patient tolerated the procedure well without immediate post procedural complication. FINDINGS: *Extrahepatic biliary ductal dilation likely post cholecystectomy physiologic distention. *Multifocal hepatic masses, as described above. *Target lesion at the LEFT hepatic lobe measuring up to 3.7 cm and demonstrating arterial phase enhancement (APHE) marked early washout (28 seconds) consistent with CEUS LR-M lesion. *Contrast-enhanced ultrasound-guided liver lesion targeted biopsy without complication. IMPRESSION: 1. Multifocal hepatic masses, with 3.7 cm LEFT hepatic lobe (segment IVb) mass with characteristics consistent  with CEUS LR-M lesion. 2. Successful contrast-enhanced, ultrasound-guided targeted core needle biopsy of LEFT hepatic lobe mass,  as above. Michaelle Birks, MD [facility] Radiology Electronically Signed   By: Michaelle Birks M.D.   On: 07/06/2022 10:05   CT CHEST ABDOMEN PELVIS W CONTRAST  Result Date: 06/11/2022 CLINICAL DATA:  Metastatic left breast cancer, assess treatment response * Tracking Code: BO * EXAM: CT CHEST, ABDOMEN, AND PELVIS WITH CONTRAST TECHNIQUE: Multidetector CT imaging of the chest, abdomen and pelvis was performed following the standard protocol during bolus administration of intravenous contrast. RADIATION DOSE REDUCTION: This exam was performed according to the departmental dose-optimization program which includes automated exposure control, adjustment of the mA and/or kV according to patient size and/or use of iterative reconstruction technique. CONTRAST:  1m OMNIPAQUE IOHEXOL 300 MG/ML SOLN, additional oral enteric contrast COMPARISON:  03/03/2022 FINDINGS: CT CHEST FINDINGS Cardiovascular: Right chest port catheter. Aortic atherosclerosis. Normal heart size. Three-vessel coronary artery calcifications. No pericardial effusion. Mediastinum/Nodes: No enlarged mediastinal, hilar, or axillary lymph nodes. Thyroid gland, trachea, and esophagus demonstrate no significant findings. Lungs/Pleura: Severe centrilobular emphysema. Multiple subpleural pulmonary nodules of the right lung base are unchanged, largest nodule medially measuring 2.0 x 1.2 cm (series 4, image 110). Minimal subpleural radiation fibrosis of the anterior left upper lobe and lingula (series 4, image 66). No pleural effusion or pneumothorax. Musculoskeletal: Status post left mastectomy CT ABDOMEN PELVIS FINDINGS Hepatobiliary: Unchanged subtly hypodense lesion of the anterior left lobe of the liver, hepatic segment IVa, dominant component measuring 1.6 x 0.9 cm (series 2, image 51). Status post cholecystectomy. Postoperative biliary dilatation. Pancreas: Unremarkable. No pancreatic ductal dilatation or  surrounding inflammatory changes. Spleen: Normal in size without significant abnormality. Adrenals/Urinary Tract: Adrenal glands are unremarkable. Duplication of the right ureters, with new, mild right hydronephrosis and hydroureter to the ureterovesicular junction (series 2, image 64, 98). No left-sided hydronephrosis. No renal calculi or lesions. Bladder is unremarkable. Stomach/Bowel: Stomach is within normal limits. Diverticulum of the transverse duodenum (series 2, image 63). Appendix appears normal. No evidence of bowel wall thickening, distention, or inflammatory changes. Pancolonic diverticulosis. Vascular/Lymphatic: Aortic atherosclerosis. No enlarged abdominal or pelvic lymph nodes. Reproductive: Status post hysterectomy. Other: No abdominal wall hernia or abnormality. No ascites. Musculoskeletal: No acute osseous findings. Unchanged, widespread sclerotic osseous metastatic disease involving the axial skeleton. IMPRESSION: 1. Multiple subpleural pulmonary nodules about the right lung base are unchanged. 2. Unchanged subtly hypodense lesion of the anterior left lobe of the liver. 3. Unchanged, widespread sclerotic osseous metastatic disease involving the axial skeleton. 4. No evidence of new metastatic disease in the chest, abdomen, or pelvis. 5. Duplication of the right ureters, with new, mild right hydronephrosis and hydroureter to the ureterovesicular junction. This is of uncertain significance, intermittently seen on prior examinations and perhaps related to vesicoureteral reflux in the setting of ureteral duplication, with no evidence of obstructing calculus, mass, or other apparent etiology of hydronephrosis. 6. Emphysema. 7. Coronary artery disease. Aortic Atherosclerosis (ICD10-I70.0) and Emphysema (ICD10-J43.9). Electronically Signed   By: ADelanna AhmadiM.D.   On: 06/11/2022 14:53    Microbiology: Results for orders placed or performed in visit on 12/07/20  Urine culture     Status: Abnormal    Collection Time: 12/07/20  1:03 PM   Specimen: Urine, Clean Catch  Result Value Ref Range Status   Specimen Description   Final    URINE, CLEAN CATCH [facility] [address] [place] [id number]  Special Requests   Final    NONE Performed at [facility], [address] [place], [place] [address]    Culture >=100,000 COLONIES/mL ESCHERICHIA COLI (A)  Final   Report Status 12/09/2020 FINAL  Final   Organism ID, Bacteria ESCHERICHIA COLI (A)  Final      Susceptibility   Escherichia coli - MIC*    AMPICILLIN >=32 RESISTANT Resistant     CEFAZOLIN >=64 RESISTANT Resistant     CEFEPIME <=0.12 SENSITIVE Sensitive     CEFTRIAXONE 32 RESISTANT Resistant     CIPROFLOXACIN <=0.25 SENSITIVE Sensitive     GENTAMICIN <=1 SENSITIVE Sensitive     IMIPENEM <=0.25 SENSITIVE Sensitive     NITROFURANTOIN <=16 SENSITIVE Sensitive     TRIMETH/SULFA <=20 SENSITIVE Sensitive     AMPICILLIN/SULBACTAM >=32 RESISTANT Resistant     PIP/TAZO <=4 SENSITIVE Sensitive     * >=100,000 COLONIES/mL ESCHERICHIA COLI    Labs: CBC: Recent Labs  Lab 07/07/22 1302 07/07/22 2230 07/08/22 0517 07/08/22 1214 07/08/22 1621  WBC 9.3 9.7 8.3 8.4 8.6  NEUTROABS 6.1  --   --   --   --   HGB 7.9* 7.8* 11.0* 11.2* 10.8*  HCT 25.3* 25.9* 33.6* 34.6* 33.5*  MCV 91.7 91.2 88.2 88.5 87.7  PLT 272 268 233 232 010   Basic Metabolic Panel: Recent Labs  Lab 07/07/22 1302 07/08/22 1214  NA 137 138  K 3.3* 3.8  CL 105 108  CO2 24 23  GLUCOSE 135* 93  BUN 16 9  CREATININE 1.01* 0.88  CALCIUM 8.4* 9.1   Liver Function Tests: Recent Labs  Lab 07/07/22 1302  AST 91*  ALT 12  ALKPHOS 119  BILITOT 0.4  PROT 7.2  ALBUMIN 3.5   CBG: Recent Labs  Lab 07/08/22 0012 07/08/22 0513 07/08/22 0759 07/08/22 1242 07/08/22 1608  GLUCAP 169* 127* 128* 88 101*    Discharge time spent: less than 30 minutes.  Signed: Vanna Scotland, MD [facility] 07/08/2022

## 2022-07-08 NOTE — Progress Notes (Signed)
MD order received in [facility] to discharge pt home today; verbally reviewed AVS and Endoscopy Discharge Instructions with pt and pt's daughter; Rx for Prilosec escribed to the [facility] on [address] and [address] in [place], [place]; pt to call Dr Drinda Butts office on Monday am to schedule follow-up appointment for 2 weeks; pt will keep her appointment at the [facility] on Thursday, 07/13/22; no questions voiced at this time; pt discharged via wheelchair by nursing to the [place] entrance

## 2022-07-08 NOTE — Progress Notes (Signed)
Pt transferred from Room 102 to Endoscopy for a scheduled EGD by the transporter/orderly; pt stable; A&O x 4

## 2022-07-08 NOTE — Progress Notes (Signed)
  Progress Note   Patient: Jessica Compton HQP:[id number] DOB: 09-Dec-1941 DOA: 07/07/2022     1 DOS: the patient was seen and examined on 07/08/2022   Brief hospital course: No notes on file  Assessment and Plan: 81 y.o. Fwith a h/o OA, BRCA, COPD, DM, HTN, HLD now being admitted with:   #. Normocytic anemia 2/2 GI Bleed Continue IV Protonix, CBC q12; NPO for now Hold A/C for now S/p 2 u pRBCs GI to see today, appreciate their guidance.   #. Mild hypokalemia - Replace orally   #. History of HTN - Continue Norvasc, lisinopril   #. History of CKD, stable at baseline - Continue monitor BMP   #. History of HLD - Continue Lipitor   Admission status: Inpatient IV Fluids: NS Diet/Nutrition: NPO Consults called: GI  DVT Px: SCDs and early ambulation. Code Status: Full Code  Disposition Plan: To home in 1-2 days        Subjective: s/p 2u pRBCs NAEON No Ab dpain; no f/c No Cp GI to see today, appreciate assistance.   Physical Exam: Vitals:   07/08/22 0030 07/08/22 0157 07/08/22 0230 07/08/22 0405  BP: (!) 117/102 (!) 147/78 (!) 144/72 (!) 162/75  Pulse: 76 79 78 82  Resp: $Remo'16 16 19 18  'FoxpV$ Temp: 98.9 F (37.2 C) 98.9 F (37.2 C) 98.5 F (36.9 C) 99 F (37.2 C)  TempSrc: Oral Oral Oral Oral  SpO2: 97% 98% 98% 99%  Weight:      Height:       GENERAL: 81 y.o.-year-old black female patient, well-developed, well-nourished lying in the bed in no acute distress.  Pleasant and cooperative.   HEENT: Head atraumatic, normocephalic. Pupils equal. Mucus membranes moist. NECK: Supple. No JVD. CHEST: Normal breath sounds bilaterally. No wheezing, rales, rhonchi or crackles. No use of accessory muscles of respiration.  No reproducible chest wall tenderness.  CARDIOVASCULAR: S1, S2 normal. No murmurs, rubs, or gallops. Cap refill <2 seconds. Pulses intact distally.  ABDOMEN: Soft, nondistended, nontender. No rebound, guarding, rigidity. Normoactive bowel sounds present in all four  quadrants.  EXTREMITIES: No pedal edema, cyanosis, or clubbing. No calf tenderness or Homan's sign.  NEUROLOGIC: The patient is alert and oriented x 3. Cranial nerves II through XII are grossly intact with no focal sensorimotor deficit. PSYCHIATRIC:  Normal affect, mood, thought content. SKIN: Warm, dry, and intact without obvious rash, lesion, or ulcer. Data Reviewed:  There are no new results to review at this time.  Family Communication: none available  Disposition: Status is: Inpatient Remains inpatient appropriate because: GIB  Planned Discharge Destination: Home    Time spent: 25 minutes  Author: Vanna Scotland, MD 07/08/2022 7:37 AM  For on call review [email or web address].

## 2022-07-08 NOTE — Transfer of Care (Signed)
Immediate Anesthesia Transfer of Care Note  Patient: Jessica Compton  Procedure(s) Performed: ESOPHAGOGASTRODUODENOSCOPY (EGD) WITH PROPOFOL  Patient Location: PACU  Anesthesia Type:General  Level of Consciousness: awake, alert  and oriented  Airway & Oxygen Therapy: Patient Spontanous Breathing and Patient connected to nasal cannula oxygen  Post-op Assessment: Report given to RN, Post -op Vital signs reviewed and stable and Patient moving all extremities  Post vital signs: Reviewed and stable  Last Vitals:  Vitals Value Taken Time  BP 123/62 07/08/22 1515  Temp 36.8 C 07/08/22 1510  Pulse 87 07/08/22 1517  Resp 24 07/08/22 1517  SpO2 95 % 07/08/22 1517  Vitals shown include unvalidated device data.  Last Pain:  Vitals:   07/08/22 1510  TempSrc:   PainSc: 0-No pain         Complications: No notable events documented.

## 2022-07-08 NOTE — Plan of Care (Signed)

## 2022-07-08 NOTE — Op Note (Signed)
[facility] Gastroenterology Patient Name: Jessica Compton Procedure Date: 07/08/2022 2:19 PM MRN: [id number] Account #: [id number] Date of Birth: 1941-07-25 Admit Type: Inpatient Age: 81 Room: [facility] ENDO ROOM 4 Gender: Female Note Status: Finalized Instrument Name: Altamese Cabal Endoscope [id number] Procedure:             Upper GI endoscopy Indications:           Hematochezia Providers:             Andrey Farmer MD, MD Referring MD:          No PCP Medicines:             Monitored Anesthesia Care Complications:         No immediate complications. Procedure:             Pre-Anesthesia Assessment:                        - Prior to the procedure, a History and Physical was                         performed, and patient medications and allergies were                         reviewed. The patient is competent. The risks and                         benefits of the procedure and the sedation options and                         risks were discussed with the patient. All questions                         were answered and informed consent was obtained.                         Patient identification and proposed procedure were                         verified by the physician, the nurse, the                         anesthesiologist, the anesthetist and the technician                         in the endoscopy suite. Mental Status Examination:                         alert and oriented. Airway Examination: normal                         oropharyngeal airway and neck mobility. Respiratory                         Examination: clear to auscultation. CV Examination:                         normal. Prophylactic Antibiotics: The patient does not  require prophylactic antibiotics. Prior                         Anticoagulants: The patient has taken no previous                         anticoagulant or antiplatelet agents. ASA Grade                         Assessment: III -  A patient with severe systemic                         disease. After reviewing the risks and benefits, the                         patient was deemed in satisfactory condition to                         undergo the procedure. The anesthesia plan was to use                         monitored anesthesia care (MAC). Immediately prior to                         administration of medications, the patient was                         re-assessed for adequacy to receive sedatives. The                         heart rate, respiratory rate, oxygen saturations,                         blood pressure, adequacy of pulmonary ventilation, and                         response to care were monitored throughout the                         procedure. The physical status of the patient was                         re-assessed after the procedure.                        After obtaining informed consent, the endoscope was                         passed under direct vision. Throughout the procedure,                         the patient's blood pressure, pulse, and oxygen                         saturations were monitored continuously. The Endoscope                         was introduced through the mouth, and advanced to the  second part of duodenum. The upper GI endoscopy was                         accomplished without difficulty. The patient tolerated                         the procedure well. Findings:      The examined esophagus was normal.      The entire examined stomach was normal.      The examined duodenum was normal. Impression:            - Normal esophagus.                        - Normal stomach.                        - Normal examined duodenum.                        - No specimens collected. Recommendation:        - Return patient to hospital ward for ongoing care.                        - Advance diet as tolerated.                        - Use a proton pump inhibitor PO  daily.                        - Given colonoscopy done about 6 months ago that was                         negative, blood on rectal likely from hemorrhoids as                         patient has been constipated. Procedure Code(s):     --- Professional ---                        [phone], Esophagogastroduodenoscopy, flexible,                         transoral; diagnostic, including collection of                         specimen(s) by brushing or washing, when performed                         (separate procedure) Diagnosis Code(s):     --- Professional ---                        K92.1, Melena (includes Hematochezia) CPT copyright 2019 American Medical Association. All rights reserved. The codes documented in this report are preliminary and upon coder review may  be revised to meet current compliance requirements. Andrey Farmer MD, MD 07/08/2022 3:17:29 PM Number of Addenda: 0 Note Initiated On: 07/08/2022 2:19 PM Estimated Blood Loss:  Estimated blood loss: none.      [facility]

## 2022-07-08 NOTE — Consult Note (Signed)
Consultation  Referring Provider: Hospitalist     Admit date: 7/14 Consult date: 7/15         Reason for Consultation:   Anemia           HPI:   Jessica Compton is a 81 y.o. lady with metastatic breast cancer stage IV who follows with oncology who presents with worsening fatigue and general malaise after a liver biopsy last week. She was feeling poorly before the biopsy as well. She denies any overt GI bleeding such as melena/hematochezia. She takes meloxicam daily without PPI. No blood thinners. Had colonoscopy 6 months ago with small polyps after a PET scan noted a possible lesion in the ascending colon. CT abdomen in the ED without any blood around the liver.  Past Medical History:  Diagnosis Date   Aneurysm ([place]) 2007   Arthritis    Cancer ([place]) 2007   diagnosed 01/07 left breast with simple mastectomu & sn bx. The pt had a 3.5cm tumor. Frozen section report on the 2 sn were negative. On permanent sections a 0.70m micrometastasis was identified. She was not felt to require a complete axillary dissection. She has completed her Tamoxifen therapy and has been released from routine f/u with medical oncology service.   COPD (chronic obstructive pulmonary disease) (HCC)    Diabetes mellitus without complication ([place]    Hyperlipidemia    Hypertension    since age 81  Malignant neoplasm of upper-outer quadrant of female breast ([place] 12/2013   Mastectomy site recurrence resected 01/26/2014, ER 90%, PR 0%, HER-2/neu nonamplified.   Other benign neoplasm of connective and other soft tissue of trunk, unspecified 2014   Personal history of colonic polyps    Personal history of tobacco use, presenting hazards to health    Special screening for malignant neoplasms, colon    Unspecified disorder of skin and subcutaneous tissue 02/19/2013   biopsy of skin over the sternum on the right breast showed hypertrophic scar,keloid formation.    Past Surgical History:  Procedure Laterality Date    ABDOMINAL HYSTERECTOMY  2004   total   BREAST BIOPSY Right January 26, 2014   Core biopsy for mild increase breast uptake on PET scan, usual ductal hyperplasia and stromal fibrosis   BREAST SURGERY Left 2007   mastectomy   chest wall mass  01/26/14   CHOLECYSTECTOMY  2007   COLONOSCOPY W/ POLYPECTOMY  2011   Dr. EBrynda Greathouse  COLONOSCOPY WITH PROPOFOL N/A 12/22/2021   Procedure: COLONOSCOPY WITH PROPOFOL;  Surgeon: AJonathon Bellows MD;  [facility]  Service: Gastroenterology;  Laterality: N/A;   EYE SURGERY Right    cataract surgery   head surgery  1991   IR IMAGING GUIDED PORT INSERTION  03/13/2022   MASTECTOMY Left 2007   PORT-A-CATH REMOVAL  2008   PORTACATH PLACEMENT  2007    Family History  Problem Relation Age of Onset   Cancer Other        ovarian,breast,colon cancers;relations not listed   Breast cancer Neg Hx      Social History   Tobacco Use   Smoking status: Some Days    Packs/day: 1.00    Years: 50.00    Total pack years: 50.00    Types: Cigarettes   Smokeless tobacco: Never  Vaping Use   Vaping Use: Some days  Substance Use Topics   Alcohol use: No    Alcohol/week: 0.0 standard drinks of alcohol   Drug use: No    Prior  to Admission medications   Medication Sig Start Date End Date Taking? Authorizing Provider  amLODipine (NORVASC) 10 MG tablet TAKE 1 TABLET(10 MG) BY MOUTH DAILY 09/12/21  Yes [name], MD  atorvastatin (LIPITOR) 20 MG tablet Take 20 mg by mouth daily at 6 PM. 08/14/14  Yes [provider]  citalopram (CELEXA) 40 MG tablet TAKE 1 TABLET(40 MG) BY MOUTH DAILY 02/27/22  Yes [name], MD  ergocalciferol (VITAMIN D2) 1.25 MG (50000 UT) capsule Take 1 capsule (50,000 Units total) by mouth once a week. 06/15/22  Yes Charlaine Dalton R, MD  lisinopril (ZESTRIL) 2.5 MG tablet TAKE 1 TABLET(2.5 MG) BY MOUTH DAILY 07/04/22  Yes [name], MD  meloxicam (MOBIC) 15 MG tablet TAKE 1 TABLET(15 MG) BY MOUTH  DAILY 04/20/22  Yes [name], MD  methocarbamol (ROBAXIN) 500 MG tablet Take 1 tablet (500 mg total) by mouth in the morning and at bedtime. 04/20/22  Yes [name], MD  lidocaine-prilocaine (EMLA) cream Apply on the port. 30 -45 min  prior to port access. 03/08/22   [name], MD  ondansetron (ZOFRAN) 8 MG tablet One pill every 8 hours as needed for nausea/vomitting. 03/08/22   [name], MD  predniSONE (DELTASONE) 20 MG tablet Take 1 tablet (20 mg total) by mouth daily with breakfast. 07/07/22   [name], MD  prochlorperazine (COMPAZINE) 10 MG tablet TAKE 1 TABLET(10 MG) BY MOUTH EVERY 6 HOURS AS NEEDED FOR NAUSEA OR VOMITING 03/27/22   Hughie Closs, PA-C    Current Facility-Administered Medications  Medication Dose Route Frequency Provider Last Rate Last Admin   0.9 %  sodium chloride infusion   Intravenous Continuous Hugelmeyer, Alexis, DO 75 mL/hr at 07/08/22 0424 New Bag at 07/08/22 0424   acetaminophen (TYLENOL) tablet 650 mg  650 mg Oral Q6H PRN Hugelmeyer, Alexis, DO       Or   acetaminophen (TYLENOL) suppository 650 mg  650 mg Rectal Q6H PRN Hugelmeyer, Alexis, DO       albuterol (PROVENTIL) (2.5 MG/3ML) 0.083% nebulizer solution 2.5 mg  2.5 mg Nebulization Q6H PRN Hugelmeyer, Alexis, DO       amLODipine (NORVASC) tablet 10 mg  10 mg Oral Daily Hugelmeyer, Alexis, DO   10 mg at 07/08/22 0901   atorvastatin (LIPITOR) tablet 20 mg  20 mg Oral q1800 Hugelmeyer, Alexis, DO       citalopram (CELEXA) tablet 40 mg  40 mg Oral Daily Hugelmeyer, Alexis, DO   40 mg at 07/08/22 0902   HYDROcodone-acetaminophen (NORCO/VICODIN) 5-325 MG per tablet 1-2 tablet  1-2 tablet Oral Q4H PRN Hugelmeyer, Alexis, DO       insulin aspart (novoLOG) injection 0-15 Units  0-15 Units Subcutaneous Q4H Hugelmeyer, Alexis, DO   2 Units at 07/08/22 0845   ipratropium (ATROVENT) nebulizer solution 0.5 mg  0.5 mg Nebulization Q6H PRN Hugelmeyer, Alexis, DO        lisinopril (ZESTRIL) tablet 2.5 mg  2.5 mg Oral Daily Hugelmeyer, Alexis, DO   2.5 mg at 07/08/22 0900   morphine (PF) 2 MG/ML injection 1 mg  1 mg Intravenous Q6H PRN Hugelmeyer, Alexis, DO       ondansetron (ZOFRAN) tablet 4 mg  4 mg Oral Q6H PRN Hugelmeyer, Alexis, DO       Or   ondansetron (ZOFRAN) injection 4 mg  4 mg Intravenous Q6H PRN Hugelmeyer, Alexis, DO       pantoprozole (PROTONIX) 80 mg /NS  100 mL infusion  8 mg/hr Intravenous Continuous Hugelmeyer, Alexis, DO 10 mL/hr at 07/08/22 0425 8 mg/hr at 07/08/22 0425   traZODone (DESYREL) tablet 25 mg  25 mg Oral QHS PRN Hugelmeyer, Alexis, DO        Allergies as of 07/07/2022 - Review Complete 07/07/2022  Allergen Reaction Noted   Metoprolol  09/08/2014     Review of Systems:    All systems reviewed and negative except where noted in HPI.  Review of Systems  Constitutional:  Negative for chills and fever.  Respiratory:  Positive for shortness of breath.   Cardiovascular:  Negative for chest pain.  Gastrointestinal:  Negative for blood in stool, melena, nausea and vomiting.  Musculoskeletal:  Positive for back pain and joint pain.  Skin:  Negative for itching and rash.  Neurological:  Positive for weakness.  Psychiatric/Behavioral:  Negative for substance abuse.   All other systems reviewed and are negative.      Physical Exam:  Vital signs in last 24 hours: Temp:  [98.5 F (36.9 C)-99.4 F (37.4 C)] 99.4 F (37.4 C) (07/15 0757) Pulse Rate:  [76-98] 86 (07/15 0757) Resp:  [16-19] 16 (07/15 0757) BP: (107-183)/(60-102) 140/66 (07/15 0757) SpO2:  [96 %-100 %] 98 % (07/15 0757) Weight:  [63.5 kg] 63.5 kg (07/14 1519)   General:   Pleasant in NAD Head:  Normocephalic and atraumatic. Eyes:   No icterus.   Conjunctiva pink. Ears:  Normal auditory acuity. Mouth: Mucosa pink moist, no lesions. Neck:  Supple; no masses felt Lungs:  No respiratory distress Abdomen:   Flat, soft, nondistended, nontender Rectal:   Maroon stool on rectal Msk:  MAEW x4, No clubbing or cyanosis. Strength 5/5. Symmetrical without gross deformities. Neurologic:  Alert and  oriented x4;  Cranial nerves II-XII intact.  Skin:  Warm, dry, pink without significant lesions or rashes. Psych:  Alert and cooperative. Normal affect.  LAB RESULTS: Recent Labs    07/07/22 1302 07/07/22 2230 07/08/22 0517  WBC 9.3 9.7 8.3  HGB 7.9* 7.8* 11.0*  HCT 25.3* 25.9* 33.6*  PLT 272 268 233   BMET Recent Labs    07/07/22 1302  NA 137  K 3.3*  CL 105  CO2 24  GLUCOSE 135*  BUN 16  CREATININE 1.01*  CALCIUM 8.4*   LFT Recent Labs    07/07/22 1302  PROT 7.2  ALBUMIN 3.5  AST 91*  ALT 12  ALKPHOS 119  BILITOT 0.4   PT/INR Recent Labs    07/08/22 0517  LABPROT 13.0  INR 1.0    STUDIES: CT ABDOMEN PELVIS W CONTRAST  Result Date: 07/07/2022 CLINICAL DATA:  Recent liver biopsy, anemia EXAM: CT ABDOMEN AND PELVIS WITH CONTRAST TECHNIQUE: Multidetector CT imaging of the abdomen and pelvis was performed using the standard protocol following bolus administration of intravenous contrast. RADIATION DOSE REDUCTION: This exam was performed according to the departmental dose-optimization program which includes automated exposure control, adjustment of the mA and/or kV according to patient size and/or use of iterative reconstruction technique. CONTRAST:  132m OMNIPAQUE IOHEXOL 300 MG/ML  SOLN COMPARISON:  06/09/2022 FINDINGS: Lower chest: Small nodular densities and pleural-based densities in the lower lung fields appear stable. There is previous left mastectomy. Coronary artery calcifications are seen. Hepatobiliary: In image 23 of series 2, there is 2 cm low-density in the anterior aspect of the left lobe. There is dilation of bile ducts, possibly related to previous cholecystectomy. This finding appears stable. Pancreas: No new focal abnormalities are  seen. Spleen: Spleen is smaller than usual in size. Adrenals/Urinary Tract:  Adrenals are unremarkable. There is no hydronephrosis. There are a few low-density lesions in the renal cortex largest measuring 2.5 cm in the lower pole of left kidney, possibly cysts. There are no renal or ureteral stones. 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 Urinary bladder is unremarkable. Stomach/Bowel: Stomach is not distended. Small bowel loops are not dilated. The appendix is not dilated. Multiple diverticula are seen in colon without signs of focal acute diverticulitis. Vascular/Lymphatic: Atherosclerotic plaques and calcifications are seen in aorta. There is ectasia of the infrarenal aorta measuring 2.5 cm. Reproductive: Uterus is not seen.  There are no adnexal masses. Other: There is no ascites or pneumoperitoneum. Musculoskeletal: There are patchy sclerotic lesions in the bony structures with no significant interval change suggesting metastatic disease. There is minimal anterolisthesis at the L4-L5 level. IMPRESSION: There is no evidence of intestinal obstruction or pneumoperitoneum. There is no hydronephrosis. Appendix is not dilated. Diverticulosis of colon without signs of focal diverticulitis. There are scattered sclerotic lesions in the bony structures and 2 cm lesion in the left lobe of liver suggesting metastatic disease with no significant change. Coronary artery disease. Other findings as described in the body of the report. Electronically Signed   By: Elmer Picker M.D.   On: 07/07/2022 19:58       Impression / Plan:   81 y.o. lady with metastatic breast cancer stage IV who follows with oncology who presents with worsening fatigue and general malaise after a liver biopsy last week and maroon stool on rectal exam. She does take NSAIDS heavily without PPI prophylaxis. Differential includes PUD, AVM, hemorrhoidal blood.   - will plan on EGD today - NPO - avoid any anticoagulation - maintain active type and screen - daily CBC's - further recs after  EGD  Raylene Miyamoto MD, MPH [place]

## 2022-07-09 LAB — TYPE AND SCREEN
ABO/RH(D): O POS
Antibody Screen: NEGATIVE
Unit division: 0
Unit division: 0

## 2022-07-09 LAB — BPAM RBC
Blood Product Expiration Date: 202308222359
Blood Product Expiration Date: 202308222359
ISSUE DATE / TIME: 202307150005
ISSUE DATE / TIME: 202307150206
Unit Type and Rh: 5100
Unit Type and Rh: 5100

## 2022-07-09 NOTE — Anesthesia Postprocedure Evaluation (Signed)
Anesthesia Post Note  Patient: Jessica Compton  Procedure(s) Performed: ESOPHAGOGASTRODUODENOSCOPY (EGD) WITH PROPOFOL  Patient location during evaluation: PACU Anesthesia Type: General Level of consciousness: awake and alert Pain management: pain level controlled Vital Signs Assessment: post-procedure vital signs reviewed and stable Respiratory status: spontaneous breathing, nonlabored ventilation and respiratory function stable Cardiovascular status: blood pressure returned to baseline and stable Postop Assessment: no apparent nausea or vomiting Anesthetic complications: no   No notable events documented.   Last Vitals:  Vitals:   07/08/22 1521 07/08/22 1604  BP: 133/73 139/82  Pulse: 85 81  Resp: 16   Temp: (!) 36.3 C 37.3 C  SpO2: 96% 98%    Last Pain:  Vitals:   07/08/22 1521  TempSrc:   PainSc: 0-No pain                 [place]

## 2022-07-10 ENCOUNTER — Encounter: Payer: Self-pay | Admitting: Internal Medicine

## 2022-07-10 ENCOUNTER — Encounter: Payer: Self-pay | Admitting: Gastroenterology

## 2022-07-10 ENCOUNTER — Telehealth: Payer: Self-pay | Admitting: Internal Medicine

## 2022-07-10 ENCOUNTER — Telehealth: Payer: Self-pay | Admitting: *Deleted

## 2022-07-10 LAB — GLUCOSE, CAPILLARY: Glucose-Capillary: 104 mg/dL — ABNORMAL HIGH (ref 70–99)

## 2022-07-10 LAB — PREPARE RBC (CROSSMATCH)

## 2022-07-10 LAB — HAPTOGLOBIN: Haptoglobin: 350 mg/dL — ABNORMAL HIGH (ref 42–346)

## 2022-07-10 NOTE — Telephone Encounter (Signed)
Patient's daughter called to report that patient had a blood transfusion at [facility] on Friday evening. She is currently scheduled for labs/NP Thursday with a possible blood transfusion on Friday. She wants to confirm that these appointments are still needed.   Please advise

## 2022-07-10 NOTE — Telephone Encounter (Addendum)
Daughter called stating patient was admitted to hospital Friday and dischg Saturday she is asking for a return call From Dr B as she has questions regarding her medications. Hospital told her not to take steroids he ordered Friday before sending her to hospital and they want her on a PPI. They did not find any reason for her hgb drop. Wants to know what to do. She was informed that it will likely be late this evening before he has the chance to call her back and she said that is fine

## 2022-07-10 NOTE — Telephone Encounter (Signed)
There is also a message from Triage.

## 2022-07-10 NOTE — Progress Notes (Signed)
spoke with patient's daughter.  I reviewed the hospital work-up.  Okay to take prednisone; and also Protonix as ordered by hospital's doctor.  Since patient is feeling better okay to cancel appointment this week.   Please make appointment for about 3-4  weeks MD labs CBC ; CMP; Ca 2729/hold tube; day 2 possible 1 unit of PRBC.

## 2022-07-11 ENCOUNTER — Encounter: Payer: Self-pay | Admitting: Internal Medicine

## 2022-07-11 ENCOUNTER — Telehealth: Payer: Self-pay

## 2022-07-11 NOTE — Telephone Encounter (Signed)
Please order foundation 1-Keytruda; on the most recent liver biopsy; Order for HER-2 testing faxed to pathology.  Foundation One order submitted online (order # [id number])

## 2022-07-11 NOTE — Telephone Encounter (Signed)
See Dr. B other note.

## 2022-07-13 ENCOUNTER — Inpatient Hospital Stay: Payer: Medicare HMO | Admitting: Oncology

## 2022-07-13 ENCOUNTER — Inpatient Hospital Stay: Payer: Medicare HMO

## 2022-07-13 LAB — CULTURE, BLOOD (ROUTINE X 2)
Culture: NO GROWTH
Culture: NO GROWTH
Special Requests: ADEQUATE

## 2022-07-14 ENCOUNTER — Inpatient Hospital Stay: Payer: Medicare HMO

## 2022-07-14 ENCOUNTER — Encounter: Payer: Self-pay | Admitting: Internal Medicine

## 2022-07-14 LAB — SURGICAL PATHOLOGY

## 2022-07-17 ENCOUNTER — Other Ambulatory Visit: Payer: Self-pay

## 2022-07-17 ENCOUNTER — Encounter: Payer: Self-pay | Admitting: Internal Medicine

## 2022-07-17 NOTE — Telephone Encounter (Signed)
Results available in chart.

## 2022-07-18 DIAGNOSIS — C50812 Malignant neoplasm of overlapping sites of left female breast: Secondary | ICD-10-CM | POA: Diagnosis not present

## 2022-07-19 ENCOUNTER — Encounter: Payer: Self-pay | Admitting: Internal Medicine

## 2022-07-25 ENCOUNTER — Other Ambulatory Visit: Payer: Self-pay

## 2022-07-27 ENCOUNTER — Other Ambulatory Visit: Payer: Self-pay

## 2022-07-27 DIAGNOSIS — C50812 Malignant neoplasm of overlapping sites of left female breast: Secondary | ICD-10-CM

## 2022-07-27 NOTE — Progress Notes (Signed)
Coding inquiry response needed by hospital coding team -Metastatic liver adenocarcinoma is a clinically significant diagnosis.  AK

## 2022-07-28 ENCOUNTER — Inpatient Hospital Stay: Payer: Medicare HMO | Admitting: Internal Medicine

## 2022-07-28 ENCOUNTER — Inpatient Hospital Stay: Payer: Medicare HMO

## 2022-07-28 NOTE — Progress Notes (Deleted)
[place] OFFICE PROGRESS NOTE  Patient Care Team: Pcp, No as PCP - General Byrnett, Forest Gleason, MD (General Surgery) Marden Noble, MD (Internal Medicine) [name], MD as Consulting Physician (Internal Medicine)   SUMMARY OF ONCOLOGIC HISTORY:  Oncology History Overview Note  # 2007- LEFT BREAST CA STAGE II [T2N70m; s/p mastec; Dr.Byrnett] ER-Pos/PR Neg; Her- 2 Neu- NEG; ACx4-Taxol x12; Femara; stopped May 2009-stopped follow up.  #JAN 2015- Post mastectomy Chest wall recurrence [chest wall mass bx-]; ER- 90%; PR- NEG; Her2 Neu- NEG; s/p excision [involving skeletal muscle/adipose tissue; clear margins]; CT July 2017- NED;   # Jan 2020- progression/ hilar- START abema [jan 14th]+ faslodex[jan 13th]; OCT-NOV 2022-PET scan shows progressive disease in the bones to liver.  Discontinue Abema+ Faslodex  # DEC 1st, 2022- START Xeloda 2w/1w; MARCH 10th, 2023-progressive disease-lung bone liver  # end of March 2023- taxol weekly; Taxol held since June 1st, 2023-neuropathy     Liver biopsy- 07/05/2022- -preliminary positive for breast cancer; ER positive.  Discussed with Dr. RReuel Derby [facility].   # DEC 2021- anemia/? CKD [stool ];#Ascending colon mass-incidentally noted on PET scan.  S/p  Dr. AVicente Males  colonoscpy DEC 2022- NEGATIVE; reviewed small adenomas.   # OSTEOPENIA [BMD- June 2016]  # Kidney cysts  DIAGNOSIS: LEFT BREAST CA  STAGE: IV- NED  ;GOALS: pallaitive      Breast cancer metastasized to skin ([facility]  02/05/2014 Initial Diagnosis   Breast cancer metastasized to skin ([facility]   Malignant neoplasm of overlapping sites of left breast in female, estrogen receptor positive ([facility]  07/24/2016 Initial Diagnosis   Cancer of overlapping sites of left female breast ([facility]   01/06/2019 - 08/05/2020 Chemotherapy   Patient is on Treatment Plan : BREAST Abemaciclib + Fulvestrant q28d     03/22/2022 -  Chemotherapy   Patient is on  Treatment Plan : BREAST Paclitaxel D1,8,15 q28d      INTERVAL HISTORY: Accompanied by daughter; ambulating with the patient.  A very pleasant 81year old African-American female patient with above history of ER/PR positive breast cancer stage IV on Taxol single agent [currently on hold because of poor tolerance] is here for follow-up.  In the interim patient underwent a biopsy of her liver lesion 3 days ago.  Patient continues to feel poorly.  She feels worse after the biopsy.  More fatigue.  Denies any blood in stools or black or stools.  Continues to feel wobbly on her gait.  No falls. Otherwise no nausea no pain.  Review of Systems  Constitutional:  Positive for malaise/fatigue. Negative for chills, diaphoresis, fever and weight loss.  HENT:  Negative for nosebleeds and sore throat.   Eyes:  Negative for double vision.  Respiratory:  Negative for cough, hemoptysis, sputum production, shortness of breath and wheezing.   Cardiovascular:  Negative for chest pain, palpitations, orthopnea and leg swelling.  Gastrointestinal:  Negative for abdominal pain, blood in stool, constipation, diarrhea, heartburn, melena, nausea and vomiting.  Musculoskeletal:  Positive for back pain and joint pain.  Skin: Negative.  Negative for itching and rash.  Neurological:  Negative for tingling, focal weakness, weakness and headaches.  Endo/Heme/Allergies:  Does not bruise/bleed easily.  Psychiatric/Behavioral:  Negative for depression. The patient is not nervous/anxious and does not have insomnia.    PAST MEDICAL HISTORY :  Past Medical History:  Diagnosis Date  . [place]neurysm ([facility] 2007  . Arthritis   . Cancer ([facility] 2007   diagnosed  01/07 left breast with simple mastectomu & sn bx. The pt had a 3.5cm tumor. Frozen section report on the 2 sn were negative. On permanent sections a 0.88m micrometastasis was identified. She was not felt to require a complete axillary dissection. She has completed her Tamoxifen  therapy and has been released from routine f/u with medical oncology service.  .Marland KitchenCOPD (chronic obstructive pulmonary disease) ([facility]   . Diabetes mellitus without complication ([facility]   . Hyperlipidemia   . Hypertension    since age 81 . Malignant neoplasm of upper-outer quadrant of female breast ([facility] 12/2013   Mastectomy site recurrence resected 01/26/2014, ER 90%, PR 0%, HER-2/neu nonamplified.  . Other benign neoplasm of connective and other soft tissue of trunk, unspecified 2014  . Personal history of colonic polyps   . Personal history of tobacco use, presenting hazards to health   . Special screening for malignant neoplasms, colon   . Unspecified disorder of skin and subcutaneous tissue 02/19/2013   biopsy of skin over the sternum on the right breast showed hypertrophic scar,keloid formation.    PAST SURGICAL HISTORY :   Past Surgical History:  Procedure Laterality Date  . ABDOMINAL HYSTERECTOMY  2004   total  . BREAST BIOPSY Right January 26, 2014   Core biopsy for mild increase breast uptake on PET scan, usual ductal hyperplasia and stromal fibrosis  . BREAST SURGERY Left 2007   mastectomy  . chest wall mass  01/26/14  . CHOLECYSTECTOMY  2007  . COLONOSCOPY W/ POLYPECTOMY  2011   Dr. EBrynda Greathouse . COLONOSCOPY WITH PROPOFOL N/A 12/22/2021   Procedure: COLONOSCOPY WITH PROPOFOL;  Surgeon: AJonathon Bellows MD;  [facility];  Service: Gastroenterology;  Laterality: N/A;  . ESOPHAGOGASTRODUODENOSCOPY (EGD) WITH PROPOFOL N/A 07/08/2022   Procedure: ESOPHAGOGASTRODUODENOSCOPY (EGD) WITH PROPOFOL;  Surgeon: LLesly Rubenstein MD;  Location: [facility] ENDOSCOPY;  Service: Endoscopy;  Laterality: N/A;  . EYE SURGERY Right    cataract surgery  . head surgery  1991  . IR IMAGING GUIDED PORT INSERTION  03/13/2022  . MASTECTOMY Left 2007  . PORT-A-CATH REMOVAL  2008  . PORTACATH PLACEMENT  2007    FAMILY HISTORY :   Family History  Problem Relation Age of Onset  . Cancer Other         ovarian,breast,colon cancers;relations not listed  . Breast cancer Neg Hx     SOCIAL HISTORY:   Social History   Tobacco Use  . Smoking status: Some Days    Packs/day: 1.00    Years: 50.00    Total pack years: 50.00    Types: Cigarettes  . Smokeless tobacco: Never  Vaping Use  . Vaping Use: Some days  Substance Use Topics  . Alcohol use: No    Alcohol/week: 0.0 standard drinks of alcohol  . Drug use: No    ALLERGIES:  is allergic to metoprolol.  MEDICATIONS:  Current Outpatient Medications  Medication Sig Dispense Refill  . amLODipine (NORVASC) 10 MG tablet TAKE 1 TABLET(10 MG) BY MOUTH DAILY 90 tablet 1  . atorvastatin (LIPITOR) 20 MG tablet Take 20 mg by mouth daily at 6 PM.    . citalopram (CELEXA) 40 MG tablet TAKE 1 TABLET(40 MG) BY MOUTH DAILY 90 tablet 1  . ergocalciferol (VITAMIN D2) 1.25 MG (50000 UT) capsule Take 1 capsule (50,000 Units total) by mouth once a week. 12 capsule 1  . lidocaine-prilocaine (EMLA) cream Apply on the port. 30 -45 min  prior to port access. 3[place]  g 3  . lisinopril (ZESTRIL) 2.5 MG tablet TAKE 1 TABLET(2.5 MG) BY MOUTH DAILY 90 tablet 1  . meloxicam (MOBIC) 15 MG tablet TAKE 1 TABLET(15 MG) BY MOUTH DAILY 90 tablet 1  . methocarbamol (ROBAXIN) 500 MG tablet Take 1 tablet (500 mg total) by mouth in the morning and at bedtime. 60 tablet 1  . omeprazole (PRILOSEC OTC) 20 MG tablet Take 2 tablets (40 mg total) by mouth daily. 60 tablet 0  . ondansetron (ZOFRAN) 8 MG tablet One pill every 8 hours as needed for nausea/vomitting. 40 tablet 1  . prochlorperazine (COMPAZINE) 10 MG tablet TAKE 1 TABLET(10 MG) BY MOUTH EVERY 6 HOURS AS NEEDED FOR NAUSEA OR VOMITING 40 tablet 1   No current facility-administered medications for this visit.    PHYSICAL EXAMINATION: ECOG PERFORMANCE STATUS: 0 - Asymptomatic  There were no vitals taken for this visit.  There were no vitals filed for this visit.      Physical Exam HENT:     Head: Normocephalic  and atraumatic.     Mouth/Throat:     Pharynx: No oropharyngeal exudate.  Eyes:     Pupils: Pupils are equal, round, and reactive to light.  Cardiovascular:     Rate and Rhythm: Normal rate and regular rhythm.  Pulmonary:     Effort: No respiratory distress.     Breath sounds: No wheezing.  Abdominal:     General: Bowel sounds are normal. There is no distension.     Palpations: Abdomen is soft. There is no mass.     Tenderness: There is no abdominal tenderness. There is no guarding or rebound.  Musculoskeletal:        General: No tenderness. Normal range of motion.     Cervical back: Normal range of motion and neck supple.  Skin:    General: Skin is warm.  Neurological:     Mental Status: She is alert and oriented to person, place, and time.  Psychiatric:        Mood and Affect: Affect normal.   LABORATORY DATA:  I have reviewed the data as listed    Component Value Date/Time   NA 138 07/08/2022 1214   NA 139 05/30/2014 0127   K 3.8 07/08/2022 1214   K 3.7 05/30/2014 0127   CL 108 07/08/2022 1214   CL 106 05/30/2014 0127   CO2 23 07/08/2022 1214   CO2 25 05/30/2014 0127   GLUCOSE 93 07/08/2022 1214   GLUCOSE 136 (H) 05/30/2014 0127   BUN 9 07/08/2022 1214   BUN 19 (H) 05/30/2014 0127   CREATININE 0.88 07/08/2022 1214   CREATININE 0.98 05/30/2014 0127   CALCIUM 9.1 07/08/2022 1214   CALCIUM 9.0 05/30/2014 0127   PROT 7.2 07/07/2022 1302   PROT 7.8 05/08/2014 1131   ALBUMIN 3.5 07/07/2022 1302   ALBUMIN 3.7 05/08/2014 1131   AST 91 (H) 07/07/2022 1302   AST 20 05/08/2014 1131   ALT 12 07/07/2022 1302   ALT 18 05/08/2014 1131   ALKPHOS 119 07/07/2022 1302   ALKPHOS 58 05/08/2014 1131   BILITOT 0.4 07/07/2022 1302   BILITOT 0.4 05/08/2014 1131   GFRNONAA >60 07/08/2022 1214   GFRNONAA 58 (L) 05/30/2014 0127   GFRAA 47 (L) 09/02/2020 0920   GFRAA >60 05/30/2014 0127    No results found for: "SPEP", "UPEP"  Lab Results  Component Value Date   WBC 8.6  07/08/2022   NEUTROABS 6.1 07/07/2022   HGB 10.8 (L) 07/08/2022  HCT 33.5 (L) 07/08/2022   MCV 87.7 07/08/2022   PLT 228 07/08/2022      Chemistry      Component Value Date/Time   NA 138 07/08/2022 1214   NA 139 05/30/2014 0127   K 3.8 07/08/2022 1214   K 3.7 05/30/2014 0127   CL 108 07/08/2022 1214   CL 106 05/30/2014 0127   CO2 23 07/08/2022 1214   CO2 25 05/30/2014 0127   BUN 9 07/08/2022 1214   BUN 19 (H) 05/30/2014 0127   CREATININE 0.88 07/08/2022 1214   CREATININE 0.98 05/30/2014 0127      Component Value Date/Time   CALCIUM 9.1 07/08/2022 1214   CALCIUM 9.0 05/30/2014 0127   ALKPHOS 119 07/07/2022 1302   ALKPHOS 58 05/08/2014 1131   AST 91 (H) 07/07/2022 1302   AST 20 05/08/2014 1131   ALT 12 07/07/2022 1302   ALT 18 05/08/2014 1131   BILITOT 0.4 07/07/2022 1302   BILITOT 0.4 05/08/2014 1131         RADIOGRAPHIC STUDIES: I have personally reviewed the radiological images as listed and agreed with the findings in the report. No results found.   ASSESSMENT & PLAN:   No problem-specific Assessment & Plan notes found for this encounter.       [name], MD 07/28/2022 1:57 PM

## 2022-07-28 NOTE — Assessment & Plan Note (Deleted)
#  Left breast ipsilateral chest wall recurrence stage IV [2015]-bone/liver metastases/lung nodules ; Currently on Taxol weekly. CT scan JUNE 18th, 2023- Multiple subpleural pulmonary nodules; subtly hypodense lesion of the anterior left lobe of the liver; Unchanged, widespread sclerotic osseous metastatic disease involving the axial skeleton; No evidence of new metastatic disease in the chest, abdomen, or pelvis. CHECKED with UNABLE to run-ER/PR HER2/neu testing in 2007.  Liver biopsy- 7/12-preliminary positive for breast cancer; ER positive.  Discussed with Dr. Reuel Derby; will order her-2 by [facility]; and NGS.  Consider alternative chemotherapy-based on patient's NGS results.  Recommend giving patient a chemotherapy break given overall poor tolerance.  #Continue HOLDING Taxol weekly cycle #3 day-15 today-given fall/neuropathy/worsening anemia  [see below].   JUNE 9th, 2023- 2D echo-EF  55-60%.   #Acute worsening of anemia-off chemotherapy; status post recent biopsy concerning for any intermittent bleeding.  Recommend urgent evaluation emergency room for CT scan.  Patient reluctantly agrees.  Also discussed need regarding need for possible blood transfusion in the emergency room.   #Worsening fatigue-multifactorial worsening anemia see above; also recommend starting patient on prednisone 20 mg once a day.  # Fall-appears mechanical/neuropathy.  Consider brain imaging given history of metastatic disease- STABLE.   # PN-2-3 sec to Taxol- monitor for now; hold chemotherapy.  Consider Neurontin. STABLE.   # Hypocalcemia-continue calcium plus vitamin D twice daily; vitamin D 25-OH- 16 [June 2023]; currently on Ergocaliferol 50,000 unit/weekly.   # Bilateral hip pain- arthritis-[s/p ortho evaluation]; on meloxicam/robaxin- refilled.    STABLE.   # HTN- continue amlodipine/Lisinopril-systolic blood pressure 016W.  Monitor closely.  STABLE.   # Bone lesions: discussed zometa. Recommend ca [500]+vit D [1000 IU].  On Zometa q 4 weeks;   # CKD- stage III- GFR 48-50- STABLE; mild hypokalemia potassium 3.3-.   # Anxiety/depression- Celexa; recommend social worker referral.  ? Zometa q4 w; cycle#3-d-8  # DISPOSITION:  # send to ER; keep port accessed re: possible internal bleeding ; s/p recent liver biopsy  # HOLD chemo today  # Follow up with NP- 7/20- labs- cbc; HOLD tube; D-2 possible 1 unit PRBC    # follow up in 3 weeks--; MD; cbc/cmp;CEA/ca27-29- Dr.B

## 2022-08-01 ENCOUNTER — Encounter: Payer: Self-pay | Admitting: Internal Medicine

## 2022-08-01 ENCOUNTER — Inpatient Hospital Stay: Payer: Medicare HMO | Attending: Internal Medicine

## 2022-08-01 ENCOUNTER — Inpatient Hospital Stay (HOSPITAL_BASED_OUTPATIENT_CLINIC_OR_DEPARTMENT_OTHER): Payer: Medicare HMO | Admitting: Internal Medicine

## 2022-08-01 VITALS — BP 155/83 | HR 92 | Temp 99.4°F | Resp 16 | Wt 140.6 lb

## 2022-08-01 DIAGNOSIS — C7800 Secondary malignant neoplasm of unspecified lung: Secondary | ICD-10-CM | POA: Insufficient documentation

## 2022-08-01 DIAGNOSIS — Z5112 Encounter for antineoplastic immunotherapy: Secondary | ICD-10-CM | POA: Insufficient documentation

## 2022-08-01 DIAGNOSIS — C787 Secondary malignant neoplasm of liver and intrahepatic bile duct: Secondary | ICD-10-CM | POA: Diagnosis not present

## 2022-08-01 DIAGNOSIS — C7951 Secondary malignant neoplasm of bone: Secondary | ICD-10-CM | POA: Diagnosis not present

## 2022-08-01 DIAGNOSIS — Z170 Estrogen receptor positive status [ER+]: Secondary | ICD-10-CM

## 2022-08-01 DIAGNOSIS — R197 Diarrhea, unspecified: Secondary | ICD-10-CM | POA: Diagnosis not present

## 2022-08-01 DIAGNOSIS — C50812 Malignant neoplasm of overlapping sites of left female breast: Secondary | ICD-10-CM | POA: Diagnosis not present

## 2022-08-01 DIAGNOSIS — C7989 Secondary malignant neoplasm of other specified sites: Secondary | ICD-10-CM | POA: Diagnosis not present

## 2022-08-01 DIAGNOSIS — Z95828 Presence of other vascular implants and grafts: Secondary | ICD-10-CM

## 2022-08-01 DIAGNOSIS — Z79899 Other long term (current) drug therapy: Secondary | ICD-10-CM | POA: Insufficient documentation

## 2022-08-01 DIAGNOSIS — R112 Nausea with vomiting, unspecified: Secondary | ICD-10-CM | POA: Diagnosis not present

## 2022-08-01 LAB — COMPREHENSIVE METABOLIC PANEL
ALT: 15 U/L (ref 0–44)
AST: 59 U/L — ABNORMAL HIGH (ref 15–41)
Albumin: 3.8 g/dL (ref 3.5–5.0)
Alkaline Phosphatase: 153 U/L — ABNORMAL HIGH (ref 38–126)
Anion gap: 12 (ref 5–15)
BUN: 20 mg/dL (ref 8–23)
CO2: 25 mmol/L (ref 22–32)
Calcium: 9.5 mg/dL (ref 8.9–10.3)
Chloride: 102 mmol/L (ref 98–111)
Creatinine, Ser: 0.88 mg/dL (ref 0.44–1.00)
GFR, Estimated: >60 mL/min (ref >60)
Glucose, Bld: 179 mg/dL — ABNORMAL HIGH (ref 70–99)
Potassium: 3.8 mmol/L (ref 3.5–5.1)
Sodium: 139 mmol/L (ref 135–145)
Total Bilirubin: 0.4 mg/dL (ref 0.3–1.2)
Total Protein: 7.6 g/dL (ref 6.5–8.1)

## 2022-08-01 LAB — CBC WITH DIFFERENTIAL/PLATELET
Abs Immature Granulocytes: 0.17 10*3/uL — ABNORMAL HIGH (ref 0.00–0.07)
Basophils Absolute: 0 10*3/uL (ref 0.0–0.1)
Basophils Relative: 0 %
Eosinophils Absolute: 0.1 10*3/uL (ref 0.0–0.5)
Eosinophils Relative: 1 %
HCT: 33.8 % — ABNORMAL LOW (ref 36.0–46.0)
Hemoglobin: 10.8 g/dL — ABNORMAL LOW (ref 12.0–15.0)
Immature Granulocytes: 2 %
Lymphocytes Relative: 19 %
Lymphs Abs: 2 10*3/uL (ref 0.7–4.0)
MCH: 28.1 pg (ref 26.0–34.0)
MCHC: 32 g/dL (ref 30.0–36.0)
MCV: 88 fL (ref 80.0–100.0)
Monocytes Absolute: 0.8 10*3/uL (ref 0.1–1.0)
Monocytes Relative: 7 %
Neutro Abs: 7.6 10*3/uL (ref 1.7–7.7)
Neutrophils Relative %: 71 %
Platelets: 232 10*3/uL (ref 150–400)
RBC: 3.84 MIL/uL — ABNORMAL LOW (ref 3.87–5.11)
RDW: 16.2 % — ABNORMAL HIGH (ref 11.5–15.5)
WBC: 10.6 10*3/uL — ABNORMAL HIGH (ref 4.0–10.5)
nRBC: 0 % (ref 0.0–0.2)

## 2022-08-01 LAB — SAMPLE TO BLOOD BANK

## 2022-08-01 MED ORDER — SODIUM CHLORIDE 0.9% FLUSH
10.0000 mL | INTRAVENOUS | Status: DC | PRN
  Administered 2022-08-01: 10 mL via INTRAVENOUS
  Filled 2022-08-01: qty 10

## 2022-08-01 MED ORDER — HEPARIN SOD (PORK) LOCK FLUSH 100 UNIT/ML IV SOLN
500.0000 [IU] | Freq: Once | INTRAVENOUS | Status: AC
  Administered 2022-08-01: 500 [IU] via INTRAVENOUS
  Filled 2022-08-01: qty 5

## 2022-08-01 NOTE — Progress Notes (Signed)
Patient has an increase in fatigue.  Reports still taking Prednisone '20mg'$  QD as prescribed by Dr. Jacinto Reap in 06/2022.  Prednisone was d/c in chart by hospitalist on recent discharge.  New low abdominal started this morning 8/10.  Reports slight constipation with complete bowel movement yesterday.

## 2022-08-01 NOTE — Assessment & Plan Note (Addendum)
#  Left breast ipsilateral chest wall recurrence stage IV [2015]-bone/liver metastases/lung nodules ; Currently on Taxol weekly. CT scan JUNE 18th, 2023- Multiple subpleural pulmonary nodules; subtly hypodense lesion of the anterior left lobe of the liver; Unchanged, widespread sclerotic osseous metastatic disease involving the axial skeleton;  Liver biopsy- 7/12- positive for breast cancer; ER positive. FISH- Her 2 LOW [+1]; NGS- PIC3K POSITIVE.   #Discontinue Taxol because of progression; intolerance to therapy [fall/neuropathy/worsening fatigue etc.]  #Discussed the pathology reports-metastatic breast cancer to the liver.  Reviewed the treatment options include- Alpelisib + fulvestrant; or Enhertu.  Discussed the pros and cons of therapies.  At this time I would recommend proceeding with Enhertu. JUNE 9th, 2023- 2D echo-EF  55-60%.   #Fatigue multifactorial-chemotherapy.  Overall improving.  Monitor for above.  # PN-2-3 sec to Taxol- monitor for now; hold chemotherapy.  Consider Neurontin. STABLE.   # Hypocalcemia-continue calcium plus vitamin D twice daily; vitamin D 25-OH- 16 [June 2023]; currently on Ergocaliferol 50,000 unit/weekly.    # Bilateral hip pain- arthritis-[s/p ortho evaluation]; on meloxicam/robaxin- refilled.   STABLE.   # HTN- continue amlodipine/Lisinopril-systolic blood pressure 482N.  Monitor closely.  STABLE.   # Bone lesions: discussed zometa. Recommend ca [500]+vit D [1000 IU]. On Zometa q 4 weeks;   # CKD- stage III- GFR 48-50- STABLE; mild hypokalemia potassium 3.3-.   # Anxiety/depression- Celexa; STABLE.   ? Zometa q4 w;   # DISPOSITION: Thursday/pref # De-access # on 8/24- MD; labs- cbc/cmp; cea; ca27-29; Enertu [new]- Dr.B  # 40 minutes face-to-face with the patient discussing the above plan of care; more than 50% of time spent on prognosis/ natural history; counseling and coordination.

## 2022-08-01 NOTE — Progress Notes (Signed)
DISCONTINUE ON PATHWAY REGIMEN - Breast     A cycle is every 28 days (3 weeks on and 1 week off):     Paclitaxel   **Always confirm dose/schedule in your pharmacy ordering system**  REASON: Toxicities / Adverse Event PRIOR TREATMENT: [id number]: Paclitaxel 80 mg/m2 D1, 8, 15 q28 Days TREATMENT RESPONSE: Stable Disease (SD)  START ON PATHWAY REGIMEN - Breast     A cycle is every 21 days:     Fam-trastuzumab deruxtecan-nxki   **Always confirm dose/schedule in your pharmacy ordering system**  Patient Characteristics: Distant Metastases or Locoregional Recurrent Disease - Unresected or Locally Advanced Unresectable Disease Progressing after Neoadjuvant and Local Therapies, ER Positive, Chemotherapy, HER2 Low, Third Line and Beyond, Prior or Contraindicated  Anthracycline and Prior Eribulin Therapeutic Status: Distant Metastases HER2 Status: Low ER Status: Positive (+) PR Status: Negative (-) Therapy Approach Indicated: Standard Chemotherapy/Endocrine Therapy Line of Therapy: Third Line and Beyond Intent of Therapy: Non-Curative / Palliative Intent, Discussed with Patient

## 2022-08-01 NOTE — Progress Notes (Signed)
[facility] OFFICE PROGRESS NOTE  Patient Care Team: Pcp, No as PCP - General Byrnett, Forest Gleason, MD (General Surgery) Marden Noble, MD (Internal Medicine) [name], MD as Consulting Physician (Internal Medicine)   SUMMARY OF ONCOLOGIC HISTORY:  Oncology History Overview Note  # 2007- LEFT BREAST CA STAGE II [T2N92m; s/p mastec; Dr.Byrnett] ER-Pos/PR Neg; Her- 2 Neu- NEG; ACx4-Taxol x12; Femara; stopped May 2009-stopped follow up.  #JAN 2015- Post mastectomy Chest wall recurrence [chest wall mass bx-]; ER- 90%; PR- NEG; Her2 Neu- NEG; s/p excision [involving skeletal muscle/adipose tissue; clear margins]; CT July 2017- NED;   # Jan 2020- progression/ hilar- START abema [jan 14th]+ faslodex[jan 13th]; OCT-NOV 2022-PET scan shows progressive disease in the bones to liver.  Discontinue Abema+ Faslodex  # DEC 1st, 2022- START Xeloda 2w/1w; MARCH 10th, 2023-progressive disease-lung bone liver  # end of March 2023- taxol weekly; Taxol held since June 1st, 2023-neuropathy     Liver biopsy- 07/05/2022- -preliminary positive for breast cancer; ER positive.  her-2 by FISH +1 [LOW]and NGS- PIC3K POSITIVE  # AUG 24th, 2023- Enhertu.   # DEC 2021- anemia/? CKD [stool ];#Ascending colon mass-incidentally noted on PET scan.  S/p  Dr. AVicente Males  colonoscpy DEC 2022- NEGATIVE; reviewed small adenomas.   # OSTEOPENIA [BMD- June 2016]  # Kidney cysts  DIAGNOSIS: LEFT BREAST CA  STAGE: IV- NED  ;GOALS: pallaitive      [place] ([facility]  02/05/2014 Initial Diagnosis   Breast cancer [facility]as[facility] ([facility]   Malignant neoplasm of overlapping sites of left breast in female, estrogen receptor positive ([facility]  07/24/2016 Initial Diagnosis   Cancer of overlapping sites of left female breast ([facility]   01/06/2019 - 08/05/2020 Chemotherapy   Patient is on Treatment Plan : BREAST Abemaciclib + Fulvestrant q28d     03/22/2022 - 05/25/2022 Chemotherapy    Patient is on Treatment Plan : BREAST Paclitaxel D1,8,15 q28d     08/17/2022 -  Chemotherapy   Patient is on Treatment Plan : BREAST METASTATIC fam-trastuzumab deruxtecan-nxki (Enhertu) q21d      INTERVAL HISTORY: Accompanied by sister; ambulating in wheelchair.  A very pleasant 81year old African-American female patient with above history of ER/PR positive breast cancer stage IV on Taxol single agent [currently on hold because of poor tolerance] is here for follow-up.  In the interim patient underwent a biopsy of her liver lesion 3 weeks ago.  Patient was recently discharged in the hospital-patient is admitted for abdominal pain.  Blood transfusion.  Last chemotherapy was about 2 months ago.  Her appetite is improving.  Fatigue is improving.  Neuropathy is improving.   Review of Systems  Constitutional:  Positive for malaise/fatigue. Negative for chills, diaphoresis, fever and weight loss.  HENT:  Negative for nosebleeds and sore throat.   Eyes:  Negative for double vision.  Respiratory:  Negative for cough, hemoptysis, sputum production, shortness of breath and wheezing.   Cardiovascular:  Negative for chest pain, palpitations, orthopnea and leg swelling.  Gastrointestinal:  Negative for abdominal pain, blood in stool, constipation, diarrhea, heartburn, melena, nausea and vomiting.  Musculoskeletal:  Positive for back pain and joint pain.  Skin: Negative.  Negative for itching and rash.  Neurological:  Negative for tingling, focal weakness, weakness and headaches.  Endo/Heme/Allergies:  Does not bruise/bleed easily.  Psychiatric/Behavioral:  Negative for depression. The patient is not nervous/anxious and does not have insomnia.    PAST MEDICAL HISTORY :  Past Medical History:  Diagnosis Date   Aneurysm ([place]) 2007   Arthritis    Cancer [facility]) 2007   diagnosed 01/07 left breast with simple mastectomu & sn bx. The pt had a 3.5cm tumor. Frozen section report on the 2 sn were negative.  On permanent sections a 0.80m micrometastasis was identified. She was not felt to require a complete axillary dissection. She has completed her Tamoxifen therapy and has been released from routine f/u with medical oncology service.   COPD (chronic obstructive pulmonary disease) (HCC)    Diabetes mellitus without complication ([facility]    Hyperlipidemia    Hypertension    since age 81  Malignant ne[place] of upper-outer quadrant of [place] breast ([place] 12[place]   Mastectomy site recurrence resected 01/26/2014, ER 90%, PR 0%, HER-2/neu nonamplified.   Other benign neoplasm of connective and other soft tissue of trunk, unspecified 2014   Personal history of colonic polyps    Personal history of tobacco use, presenting hazards to health    Special screening for malignant neoplasms, colon    Unspecified disorder of skin and subcutaneous tissue 02/19/2013   biopsy of skin over the sternum on the right breast showed hypertrophic scar,keloid formation.    PAST SURGICAL HISTORY :   Past Surgical History:  Procedure Laterality Date   ABDOMINAL HYSTERECTOMY  2004   total   BREAST BIOPSY Right January 26, 2014   Core biopsy for mild increase breast uptake on PET scan, usual ductal hyperplasia and stromal fibrosis   BREAST SURGERY Left 2007   mastectomy   chest wall mass  01/26/14   CHOLECYSTECTOMY  2007   COLONOSCOPY W/ POLYPECTOMY  2011   Dr. EBrynda Greathouse  COLONOSCOPY WITH PROPOFOL N/A 12/22/2021   Procedure: COLONOSCOPY WITH PROPOFOL;  Surgeon: AJonathon Bellows MD;  [facility];  Service: Gastroenterology;  Laterality: N/A;   ESOPHAGOGASTRODUODENOSCOPY (EGD) WITH PROPOFOL N/A 07/08/2022   Procedure: ESOPHAGOGASTRODUODENOSCOPY (EGD) WITH PROPOFOL;  Surgeon: LLesly Rubenstein MD;  Location: [facility] ENDOSCOPY;  Service: Endoscopy;  Laterality: N/A;   EYE SURGERY Right    cataract surgery   head surgery  1991   IR IMAGING GUIDED PORT INSERTION  03/13/2022   MASTECTOMY Left 2007   PORT-A-CATH REMOVAL   2008   PORTACATH PLACEMENT  2007    FAMILY HISTORY :   Family History  Problem Relation Age of Onset   Cancer Other        ovarian,breast,colon cancers;relations not listed   Breast cancer Neg Hx     SOCIAL HISTORY:   Social History   Tobacco Use   Smoking status: Some Days    Packs/day: 1.00    Years: 50.00    Total pack years: 50.00    Types: Cigarettes   Smokeless tobacco: Never  Vaping Use   Vaping Use: Some days  Substance Use Topics   Alcohol use: No    Alcohol/week: 0.0 standard drinks of alcohol   Drug use: No    ALLERGIES:  is allergic to metoprolol.  MEDICATIONS:  Current Outpatient Medications  Medication Sig Dispense Refill   amLODipine (NORVASC) 10 MG tablet TAKE 1 TABLET(10 MG) BY MOUTH DAILY 90 tablet 1   atorvastatin (LIPITOR) 20 MG tablet Take 20 mg by mouth daily at 6 PM.     citalopram (CELEXA) 40 MG tablet TAKE 1 TABLET(40 MG) BY MOUTH DAILY 90 tablet 1   ergocalciferol (VITAMIN D2) 1.25 MG (50000 UT) capsule Take 1 capsule (50,000 Units total) by mouth once a week. 12 capsule  1   lidocaine-prilocaine (EMLA) cream Apply on the port. 30 -45 min  prior to port access. 30 g 3   lisinopril (ZESTRIL) 2.5 MG tablet TAKE 1 TABLET(2.5 MG) BY MOUTH DAILY 90 tablet 1   omeprazole (PRILOSEC OTC) 20 MG tablet Take 2 tablets (40 mg total) by mouth daily. 60 tablet 0   ondansetron (ZOFRAN) 8 MG tablet One pill every 8 hours as needed for nausea/vomitting. 40 tablet 1   predniSONE (DELTASONE) 20 MG tablet Take 20 mg by mouth daily with breakfast.     prochlorperazine (COMPAZINE) 10 MG tablet TAKE 1 TABLET(10 MG) BY MOUTH EVERY 6 HOURS AS NEEDED FOR NAUSEA OR VOMITING 40 tablet 1   meloxicam (MOBIC) 15 MG tablet TAKE 1 TABLET(15 MG) BY MOUTH DAILY (Patient not taking: Reported on 08/01/2022) 90 tablet 1   methocarbamol (ROBAXIN) 500 MG tablet Take 1 tablet (500 mg total) by mouth in the morning and at bedtime. (Patient not taking: Reported on 08/01/2022) 60 tablet 1    No current facility-administered medications for this visit.    PHYSICAL EXAMINATION: ECOG PERFORMANCE STATUS: 0 - Asymptomatic  BP (!) 155/83 (BP Location: Right Arm, Patient Position: Sitting)   Pulse 92   Temp 99.4 F (37.4 C) (Tympanic)   Resp 16   Wt 140 lb 9.6 oz (63.8 kg)   SpO2 97%   BMI 23.40 kg/m   Filed Weights   08/01/22 1100  Weight: 140 lb 9.6 oz (63.8 kg)        Physical Exam HENT:     Head: Normocephalic and atraumatic.     Mouth/Throat:     Pharynx: No oropharyngeal exudate.  Eyes:     Pupils: Pupils are equal, round, and reactive to light.  Cardiovascular:     Rate and Rhythm: Normal rate and regular rhythm.  Pulmonary:     Effort: No respiratory distress.     Breath sounds: No wheezing.  Abdominal:     General: Bowel sounds are normal. There is no distension.     Palpations: Abdomen is soft. There is no mass.     Tenderness: There is no abdominal tenderness. There is no guarding or rebound.  Musculoskeletal:        General: No tenderness. Normal range of motion.     Cervical back: Normal range of motion and neck supple.  Skin:    General: Skin is warm.  Neurological:     Mental Status: She is alert and oriented to person, place, and time.  Psychiatric:        Mood and Affect: Affect normal.    LABORATORY DATA:  I have reviewed the data as listed    Component Value Date/Time   NA 139 08/01/2022 1026   NA 139 05/30/2014 0127   K 3.8 08/01/2022 1026   K 3.7 05/30/2014 0127   CL 102 08/01/2022 1026   CL 106 05/30/2014 0127   CO2 25 08/01/2022 1026   CO2 25 05/30/2014 0127   GLUCOSE 179 (H) 08/01/2022 1026   GLUCOSE 136 (H) 05/30/2014 0127   BUN 20 08/01/2022 1026   BUN 19 (H) 05/30/2014 0127   CREATININE 0.88 08/01/2022 1026   CREATININE 0.98 05/30/2014 0127   CALCIUM 9.5 08/01/2022 1026   CALCIUM 9.0 05/30/2014 0127   PROT 7.6 08/01/2022 1026   PROT 7.8 05/08/2014 1131   ALBUMIN 3.8 08/01/2022 1026   ALBUMIN 3.7 05/08/2014  1131   AST 59 (H) 08/01/2022 1026   AST 20 05/08/2014  1131   ALT 15 08/01/2022 1026   ALT 18 05/08/2014 1131   ALKPHOS 153 (H) 08/01/2022 1026   ALKPHOS 58 05/08/2014 1131   BILITOT 0.4 08/01/2022 1026   BILITOT 0.4 05/08/2014 1131   GFRNONAA >60 08/01/2022 1026   GFRNONAA 58 (L) 05/30/2014 0127   GFRAA 47 (L) 09/02/2020 0920   GFRAA >60 05/30/2014 0127    No results found for: "SPEP", "UPEP"  Lab Results  Component Value Date   WBC 10.6 (H) 08/01/2022   NEUTROABS 7.6 08/01/2022   HGB 10.8 (L) 08/01/2022   HCT 33.8 (L) 08/01/2022   MCV 88.0 08/01/2022   PLT 232 08/01/2022      Chemistry      Component Value Date/Time   NA 139 08/01/2022 1026   NA 139 05/30/2014 0127   K 3.8 08/01/2022 1026   K 3.7 05/30/2014 0127   CL 102 08/01/2022 1026   CL 106 05/30/2014 0127   CO2 25 08/01/2022 1026   CO2 25 05/30/2014 0127   BUN 20 08/01/2022 1026   BUN 19 (H) 05/30/2014 0127   CREATININE 0.88 08/01/2022 1026   CREATININE 0.98 05/30/2014 0127      Component Value Date/Time   CALCIUM 9.5 08/01/2022 1026   CALCIUM 9.0 05/30/2014 0127   ALKPHOS 153 (H) 08/01/2022 1026   ALKPHOS 58 05/08/2014 1131   AST 59 (H) 08/01/2022 1026   AST 20 05/08/2014 1131   ALT 15 08/01/2022 1026   ALT 18 05/08/2014 1131   BILITOT 0.4 08/01/2022 1026   BILITOT 0.4 05/08/2014 1131         RADIOGRAPHIC STUDIES: I have personally reviewed the radiological images as listed and agreed with the findings in the report. No results found.   ASSESSMENT & PLAN:   Malignant neoplasm of overlapping sites of left breast in female, estrogen receptor positive ([place]) #Left breast ipsilateral chest wall recurrence stage IV [2015]-bone/liver metastases/lung nodules ; Currently on Taxol weekly. CT scan JUNE 18th, 2023- Multiple subpleural pulmonary nodules; subtly hypodense lesion of the anterior left lobe of the liver; Unchanged, widespread sclerotic osseous metastatic disease involving the axial skeleton;   Liver biopsy- 7/12- positive for breast cancer; ER positive. FISH- Her 2 LOW [+1]; NGS- PIC3K POSITIVE.   #Discontinue Taxol because of progression; intolerance to therapy [fall/neuropathy/worsening fatigue etc.]  #Discussed the pathology reports-metastatic breast cancer to the liver.  Reviewed the treatment options include- Alpelisib + fulvestrant; or Enhertu.  Discussed the pros and cons of therapies.  At this time I would recommend proceeding with Enhertu. JUNE 9th, 2023- 2D echo-EF  55-60%.   #Fatigue multifactorial-chemotherapy.  Overall improving.  Monitor for above.  # PN-2-3 sec to Taxol- monitor for now; hold chemotherapy.  Consider Neurontin. STABLE.   # Hypocalcemia-continue calcium plus vitamin D twice daily; vitamin D 25-OH- 16 [June 2023]; currently on Ergocaliferol 50,000 unit/weekly.    # Bilateral hip pain- arthritis-[s/p ortho evaluation]; on meloxicam/robaxin- refilled.   STABLE.   # HTN- continue amlodipine/Lisinopril-systolic blood pressure 923R.  Monitor closely.  STABLE.   # Bone lesions: discussed zometa. Recommend ca [500]+vit D [1000 IU]. On Zometa q 4 weeks;   # CKD- stage III- GFR 48-50- STABLE; mild hypokalemia potassium 3.3-.   # Anxiety/depression- Celexa; STABLE.   ? Zometa q4 w;   # DISPOSITION: Thursday/pref # De-access # on 8/24- MD; labs- cbc/cmp; cea; ca27-29; Enertu [new]- Dr.B  # 40 minutes face-to-face with the patient discussing the above plan of care; more than 50%  of time spent on prognosis/ natural history; counseling and coordination.             [name], MD 08/01/2022 12:58 PM

## 2022-08-02 ENCOUNTER — Other Ambulatory Visit: Payer: Self-pay

## 2022-08-02 ENCOUNTER — Inpatient Hospital Stay: Payer: Medicare HMO

## 2022-08-02 LAB — CEA: CEA: 16.1 ng/mL — ABNORMAL HIGH (ref 0.0–4.7)

## 2022-08-02 LAB — CANCER ANTIGEN 27.29: CA 27.29: 302.1 U/mL — ABNORMAL HIGH (ref 0.0–38.6)

## 2022-08-06 ENCOUNTER — Other Ambulatory Visit: Payer: Self-pay

## 2022-08-16 MED FILL — Dexamethasone Sodium Phosphate Inj 100 MG/10ML: INTRAMUSCULAR | Qty: 1 | Status: AC

## 2022-08-17 ENCOUNTER — Encounter: Payer: Self-pay | Admitting: Internal Medicine

## 2022-08-17 ENCOUNTER — Inpatient Hospital Stay: Payer: Medicare HMO

## 2022-08-17 ENCOUNTER — Inpatient Hospital Stay (HOSPITAL_BASED_OUTPATIENT_CLINIC_OR_DEPARTMENT_OTHER): Payer: Medicare HMO | Admitting: Internal Medicine

## 2022-08-17 VITALS — BP 141/62 | HR 71 | Temp 97.0°F | Ht 65.0 in | Wt 137.7 lb

## 2022-08-17 DIAGNOSIS — C7800 Secondary malignant neoplasm of unspecified lung: Secondary | ICD-10-CM | POA: Diagnosis not present

## 2022-08-17 DIAGNOSIS — C50812 Malignant neoplasm of overlapping sites of left female breast: Secondary | ICD-10-CM | POA: Diagnosis not present

## 2022-08-17 DIAGNOSIS — C787 Secondary malignant neoplasm of liver and intrahepatic bile duct: Secondary | ICD-10-CM | POA: Diagnosis not present

## 2022-08-17 DIAGNOSIS — R197 Diarrhea, unspecified: Secondary | ICD-10-CM | POA: Diagnosis not present

## 2022-08-17 DIAGNOSIS — Z170 Estrogen receptor positive status [ER+]: Secondary | ICD-10-CM

## 2022-08-17 DIAGNOSIS — C7951 Secondary malignant neoplasm of bone: Secondary | ICD-10-CM | POA: Diagnosis not present

## 2022-08-17 DIAGNOSIS — R112 Nausea with vomiting, unspecified: Secondary | ICD-10-CM | POA: Diagnosis not present

## 2022-08-17 DIAGNOSIS — C7989 Secondary malignant neoplasm of other specified sites: Secondary | ICD-10-CM | POA: Diagnosis not present

## 2022-08-17 DIAGNOSIS — Z5112 Encounter for antineoplastic immunotherapy: Secondary | ICD-10-CM | POA: Diagnosis not present

## 2022-08-17 LAB — COMPREHENSIVE METABOLIC PANEL
ALT: 44 U/L (ref 0–44)
AST: 80 U/L — ABNORMAL HIGH (ref 15–41)
Albumin: 3.4 g/dL — ABNORMAL LOW (ref 3.5–5.0)
Alkaline Phosphatase: 151 U/L — ABNORMAL HIGH (ref 38–126)
Anion gap: 9 (ref 5–15)
BUN: 17 mg/dL (ref 8–23)
CO2: 24 mmol/L (ref 22–32)
Calcium: 8.7 mg/dL — ABNORMAL LOW (ref 8.9–10.3)
Chloride: 104 mmol/L (ref 98–111)
Creatinine, Ser: 1.31 mg/dL — ABNORMAL HIGH (ref 0.44–1.00)
GFR, Estimated: 41 mL/min — ABNORMAL LOW (ref >60)
Glucose, Bld: 226 mg/dL — ABNORMAL HIGH (ref 70–99)
Potassium: 3.4 mmol/L — ABNORMAL LOW (ref 3.5–5.1)
Sodium: 137 mmol/L (ref 135–145)
Total Bilirubin: 0.6 mg/dL (ref 0.3–1.2)
Total Protein: 7.4 g/dL (ref 6.5–8.1)

## 2022-08-17 LAB — CBC WITH DIFFERENTIAL/PLATELET
Abs Immature Granulocytes: 0.06 10*3/uL (ref 0.00–0.07)
Basophils Absolute: 0 10*3/uL (ref 0.0–0.1)
Basophils Relative: 0 %
Eosinophils Absolute: 0.1 10*3/uL (ref 0.0–0.5)
Eosinophils Relative: 1 %
HCT: 31.1 % — ABNORMAL LOW (ref 36.0–46.0)
Hemoglobin: 9.9 g/dL — ABNORMAL LOW (ref 12.0–15.0)
Immature Granulocytes: 1 %
Lymphocytes Relative: 41 %
Lymphs Abs: 2.4 10*3/uL (ref 0.7–4.0)
MCH: 27.8 pg (ref 26.0–34.0)
MCHC: 31.8 g/dL (ref 30.0–36.0)
MCV: 87.4 fL (ref 80.0–100.0)
Monocytes Absolute: 0.6 10*3/uL (ref 0.1–1.0)
Monocytes Relative: 10 %
Neutro Abs: 2.7 10*3/uL (ref 1.7–7.7)
Neutrophils Relative %: 47 %
Platelets: 251 10*3/uL (ref 150–400)
RBC: 3.56 MIL/uL — ABNORMAL LOW (ref 3.87–5.11)
RDW: 16.1 % — ABNORMAL HIGH (ref 11.5–15.5)
WBC: 5.9 10*3/uL (ref 4.0–10.5)
nRBC: 0 % (ref 0.0–0.2)

## 2022-08-17 MED ORDER — LIDOCAINE-PRILOCAINE 2.5-2.5 % EX CREA: TOPICAL_CREAM | CUTANEOUS | 3 refills | Status: AC

## 2022-08-17 MED ORDER — ACETAMINOPHEN 325 MG PO TABS
650.0000 mg | ORAL_TABLET | Freq: Once | ORAL | Status: AC
  Administered 2022-08-17: 650 mg via ORAL
  Filled 2022-08-17: qty 2

## 2022-08-17 MED ORDER — SODIUM CHLORIDE 0.9 % IV SOLN
10.0000 mg | Freq: Once | INTRAVENOUS | Status: AC
  Administered 2022-08-17: 10 mg via INTRAVENOUS
  Filled 2022-08-17: qty 10

## 2022-08-17 MED ORDER — PALONOSETRON HCL INJECTION 0.25 MG/5ML
0.2500 mg | Freq: Once | INTRAVENOUS | Status: AC
  Administered 2022-08-17: 0.25 mg via INTRAVENOUS
  Filled 2022-08-17: qty 5

## 2022-08-17 MED ORDER — DIPHENHYDRAMINE HCL 25 MG PO CAPS
50.0000 mg | ORAL_CAPSULE | Freq: Once | ORAL | Status: AC
  Administered 2022-08-17: 50 mg via ORAL
  Filled 2022-08-17: qty 2

## 2022-08-17 MED ORDER — HEPARIN SOD (PORK) LOCK FLUSH 100 UNIT/ML IV SOLN
500.0000 [IU] | Freq: Once | INTRAVENOUS | Status: DC | PRN
  Filled 2022-08-17: qty 5

## 2022-08-17 MED ORDER — SODIUM CHLORIDE 0.9% FLUSH
10.0000 mL | Freq: Once | INTRAVENOUS | Status: AC
  Administered 2022-08-17: 10 mL via INTRAVENOUS
  Filled 2022-08-17: qty 10

## 2022-08-17 MED ORDER — MIRTAZAPINE 15 MG PO TABS
15.0000 mg | ORAL_TABLET | Freq: Every day | ORAL | 3 refills | Status: DC
Start: 2022-08-17 — End: 2022-09-26

## 2022-08-17 MED ORDER — FAM-TRASTUZUMAB DERUXTECAN-NXKI CHEMO 100 MG IV SOLR
5.4000 mg/kg | Freq: Once | INTRAVENOUS | Status: AC
  Administered 2022-08-17: 338 mg via INTRAVENOUS
  Filled 2022-08-17: qty 16.9

## 2022-08-17 MED ORDER — HEPARIN SOD (PORK) LOCK FLUSH 100 UNIT/ML IV SOLN
INTRAVENOUS | Status: AC
  Administered 2022-08-17: 500 [IU] via INTRAVENOUS
  Filled 2022-08-17: qty 5

## 2022-08-17 MED ORDER — DEXTROSE 5 % IV SOLN
Freq: Once | INTRAVENOUS | Status: AC
  Filled 2022-08-17: qty 250

## 2022-08-17 MED ORDER — PREDNISONE 10 MG PO TABS: 10.0000 mg | ORAL_TABLET | Freq: Every day | ORAL | 0 refills | Status: DC

## 2022-08-17 MED ORDER — HEPARIN SOD (PORK) LOCK FLUSH 100 UNIT/ML IV SOLN
500.0000 [IU] | Freq: Once | INTRAVENOUS | Status: AC
  Filled 2022-08-17: qty 5

## 2022-08-17 NOTE — Progress Notes (Signed)
Nutrition Assessment:  81 year old female with stage IV breast cancer.  Past medical history DM, HTN, HLD.  Patient receiving enhertu.  Met with patient during infusion.  Reports that she does not have much of an appetite.  Says that she can't make homemade biscuits anymore because she can't feel the dough and she does not like canned biscuits.  Son works during the day and she is home alone during the day.  Says that she was able to eat a taco yesterday with beef, lettuce, tomato and cheese.  "I just nibble."  Likes peanut butter crackers.  Drinks ensure   Medications: Vit D, prilosec, zofran, compazine, adding remeron and prednisone today  Labs: reviewed  Anthropometrics:   Height: 65 inches Weight: 137 lb 11.2 oz 146 lb on 7/12 151 lb 11.2 oz on 3/29  BMI: 23  9% weight loss in the last 5 months, concerning   Estimated Energy Needs  Kcals: 1550-1860 Protein: 77-93 g Fluid: 1550-1860 ml  NUTRITION DIAGNOSIS: Inadequate oral intake related to cancer and cancer related treatments as evidenced by 9% weight loss in the last 5 months and decreased appetite   INTERVENTION:  Reviewed ways to add calories in diet. Handout provided Encouraged 350 calorie ensure shake Contact information provided    MONITORING, EVALUATION, GOAL: weight trends, intake   NEXT VISIT: Thursday, Sept 14 during infusion  Jessica Compton, [place], [place] Registered Dietitian [phone]

## 2022-08-17 NOTE — Assessment & Plan Note (Addendum)
#  Left breast ipsilateral chest wall recurrence stage IV [2015]-bone/liver metastases/lung nodules ; CT scan JUNE 18th, 2023- Multiple subpleural pulmonary nodules; subtly hypodense lesion of the anterior left lobe of the liver; Unchanged, widespread sclerotic osseous metastatic disease involving the axial skeleton;  Liver biopsy- 7/12- positive for breast cancer; ER positive. FISH- Her 2 LOW [+1]; NGS- PIC3K POSITIVE.  Most recently on Taxol weekly.   #Discontinue Taxol because of progression; intolerance to therapy [fall/neuropathy/worsening fatigue etc.]  #Today proceed with Enhertu cycle #1. Labs today reviewed;  acceptable for treatment today.  JUNE 9th, 2023- 2D echo-EF  55-60%.  Again reviewed the potential side effects including but not limited to nausea vomiting interstitial lung disease etc.  #Fatigue multifactorial-chemotherapy.  Continue prednisone 10 mg a day.  . Pt declines MRI Brain.   # PN-2-3 sec to Taxol- monitor for now; discontinued Taxol chemotherapy.  Consider Neurontin. STABLE.   #Elevated blood sugars [?  Prediabetes]-monitor closely on steroids/dexamethasone premedication.  Continues evaluated recommend metformin.  # Hypocalcemia-continue calcium plus vitamin D twice daily; vitamin D 25-OH- 16 [June 2023]; currently on Ergocaliferol 50,000 unit/weekly.    # Bilateral hip pain- arthritis-[s/p ortho evaluation]; on meloxicam/robaxin- refilled.   STABLE.   # HTN- continue amlodipine/Lisinopril-systolic blood pressure 702O.  Monitor closely.  STABLE.   # Bone lesions: discussed zometa. Recommend ca [500]+vit D [1000 IU]. HOLD Zometa q 4 weeks; STABLE.   # CKD- stage III- GFR 48-50- STABLE; mild hypokalemia potassium 3.3-.   # Anxiety/depression- Celexa; DC Celexa.  But switch to remeron-for apetite.   # Weight loss-awaiting reevaluation with nutrition today.  Add remeron as above.   ? Zometa q4 w;   # DISPOSITION: Thursday/pref # Proceed with Enhertu today # follow  up in 1 week- NP-labs- cbc/bmp; Possible IVFs over 1 hour # follow up in 3 weeks MD; labs- cbc/cmp; cea; ca27-29; Enertu  - Dr.B

## 2022-08-17 NOTE — Progress Notes (Signed)
[facility] OFFICE PROGRESS NOTE  Patient Care Team: Pcp, No as PCP - General Byrnett, Forest Gleason, MD (General Surgery) Marden Noble, MD (Internal Medicine) [name], MD as Consulting Physician (Internal Medicine)   SUMMARY OF ONCOLOGIC HISTORY:  Oncology History Overview Note  # 2007- LEFT BREAST CA STAGE II [T2N60m; s/p mastec; Dr.Byrnett] ER-Pos/PR Neg; Her- 2 Neu- NEG; ACx4-Taxol x12; Femara; stopped May 2009-stopped follow up.  #JAN 2015- Post mastectomy Chest wall recurrence [chest wall mass bx-]; ER- 90%; PR- NEG; Her2 Neu- NEG; s/p excision [involving skeletal muscle/adipose tissue; clear margins]; CT July 2017- NED;   # Jan 2020- progression/ hilar- START abema [jan 14th]+ faslodex[jan 13th]; OCT-NOV 2022-PET scan shows progressive disease in the bones to liver.  Discontinue Abema+ Faslodex  # DEC 1st, 2022- START Xeloda 2w/1w; MARCH 10th, 2023-progressive disease-lung bone liver  # end of March 2023- taxol weekly; Taxol held since June 1st, 2023-neuropathy     Liver biopsy- 07/05/2022- -preliminary positive for breast cancer; ER positive.  her-2 by FISH +1 [LOW]and NGS- PIC3K POSITIVE  # AUG 24th, 2023- Enhertu.   # DEC 2021- anemia/? CKD [stool ];#Ascending colon mass-incidentally noted on PET scan.  S/p  Dr. AVicente Males  colonoscpy DEC 2022- NEGATIVE; reviewed small adenomas.   # OSTEOPENIA [BMD- June 2016]  # Kidney cysts  DIAGNOSIS: LEFT BREAST CA  STAGE: IV- NED  ;GOALS: pallaitive      [place] skin [place]  02/05/2014 Initial Diagnosis   [facility]ast [facility] skin ([facility]   Malignant neoplasm of overlapping sites of left breast in female, estrogen receptor positive ([place]  07/24/2016 Initial Diagnosis   Cancer of overlapping sites of left female breast ([place]   01/06/2019 - 08/05/2020 Chemotherapy   Patient is on Treatment Plan : BREAST Abemaciclib + Fulvestrant q28d     03/22/2022 - 05/25/2022 Chemotherapy    Patient is on Treatment Plan : BREAST Paclitaxel D1,8,15 q28d     08/17/2022 - 08/17/2022 Chemotherapy   Patient is on Treatment Plan : BREAST METASTATIC fam-trastuzumab deruxtecan-nxki (Enhertu) q21d     08/17/2022 -  Chemotherapy   Patient is on Treatment Plan : BREAST METASTATIC Fam-Trastuzumab Deruxtecan-nxki (Enhertu) (5.4) q21d      INTERVAL HISTORY: Accompanied by sister; ambulating in wheelchair.  A very pleasant 81year old African-American female patient with above history of ER/PR positive breast cancer stage IV most recently on Taxol single agent [currently on hold because of poor tolerance] is here for follow-up to proceed with Enhertu.   Last chemotherapy was > 2 months ago.  Her appetite is improving.  Fatigue is improving.  Neuropathy is improving.  However continues to c/o weakness more so the last week and a half.    Review of Systems  Constitutional:  Positive for malaise/fatigue. Negative for chills, diaphoresis, fever and weight loss.  HENT:  Negative for nosebleeds and sore throat.   Eyes:  Negative for double vision.  Respiratory:  Negative for cough, hemoptysis, sputum production, shortness of breath and wheezing.   Cardiovascular:  Negative for chest pain, palpitations, orthopnea and leg swelling.  Gastrointestinal:  Negative for abdominal pain, blood in stool, constipation, diarrhea, heartburn, melena, nausea and vomiting.  Musculoskeletal:  Positive for back pain and joint pain.  Skin: Negative.  Negative for itching and rash.  Neurological:  Negative for tingling, focal weakness, weakness and headaches.  Endo/Heme/Allergies:  Does not bruise/bleed easily.  Psychiatric/Behavioral:  Negative for depression. The patient is not nervous/anxious and does  not have insomnia.    PAST MEDICAL HISTORY :  Past Medical History:  Diagnosis Date   Aneurysm ([place]) 2007   Arthritis    Cancer ([place]) 2007   diagnosed 01/07 left breast with simple mastectomu & sn bx. The pt had  a 3.5cm tumor. Frozen section report on the 2 sn were negative. On permanent sections a 0.58m micrometastasis was identified. She was not felt to require a complete axillary dissection. She has completed her Tamoxifen therapy and has been released from routine f/u with medical oncology service.   COPD (chronic obstructive pulmonary disease) (HCC)    Diabetes mellitus without complication ([place]    Hyperlipidemia    Hypertension    since age 81  Malignant neoplasm of upper-outer quadrant of female breast ([place] 12/[place]   Mastectomy site recurrence resected 01/26/2014, ER 90%, PR 0%, HER-2/neu nonamplified.   Other benign neoplasm of connective and other soft tissue of trunk, unspecified 2014   Personal history of colonic polyps    Personal history of tobacco use, presenting hazards to health    Special screening for malignant neoplasms, colon    Unspecified disorder of skin and subcutaneous tissue 02/19/2013   biopsy of skin over the sternum on the right breast showed hypertrophic scar,keloid formation.    PAST SURGICAL HISTORY :   Past Surgical History:  Procedure Laterality Date   ABDOMINAL HYSTERECTOMY  2004   total   BREAST BIOPSY Right January 26, 2014   Core biopsy for mild increase breast uptake on PET scan, usual ductal hyperplasia and stromal fibrosis   BREAST SURGERY Left 2007   mastectomy   chest wall mass  01/26/14   CHOLECYSTECTOMY  2007   COLONOSCOPY W/ POLYPECTOMY  2011   Dr. EBrynda Greathouse  COLONOSCOPY WITH PROPOFOL N/A 12/22/2021   Procedure: COLONOSCOPY WITH PROPOFOL;  Surgeon: AJonathon Bellows MD;  [facility];  Service: Gastroenterology;  Laterality: N/A;   ESOPHAGOGASTRODUODENOSCOPY (EGD) WITH PROPOFOL N/A 07/08/2022   Procedure: ESOPHAGOGASTRODUODENOSCOPY (EGD) WITH PROPOFOL;  Surgeon: LLesly Rubenstein MD;  Location: [facility] ENDOSCOPY;  Service: Endoscopy;  Laterality: N/A;   EYE SURGERY Right    cataract surgery   head surgery  1991   IR IMAGING GUIDED PORT  INSERTION  03/13/2022   MASTECTOMY Left 2007   PORT-A-CATH REMOVAL  2008   PORTACATH PLACEMENT  2007    FAMILY HISTORY :   Family History  Problem Relation Age of Onset   Cancer Other        ovarian,breast,colon cancers;relations not listed   Breast cancer Neg Hx     SOCIAL HISTORY:   Social History   Tobacco Use   Smoking status: Some Days    Packs/day: 1.00    Years: 50.00    Total pack years: 50.00    Types: Cigarettes   Smokeless tobacco: Never  Vaping Use   Vaping Use: Some days  Substance Use Topics   Alcohol use: No    Alcohol/week: 0.0 standard drinks of alcohol   Drug use: No    ALLERGIES:  is allergic to metoprolol.  MEDICATIONS:  Current Outpatient Medications  Medication Sig Dispense Refill   amLODipine (NORVASC) 10 MG tablet TAKE 1 TABLET(10 MG) BY MOUTH DAILY 90 tablet 1   atorvastatin (LIPITOR) 20 MG tablet Take 20 mg by mouth daily at 6 PM.     ergocalciferol (VITAMIN D2) 1.25 MG (50000 UT) capsule Take 1 capsule (50,000 Units total) by mouth once a week. 12 capsule 1  lisinopril (ZESTRIL) 2.5 MG tablet TAKE 1 TABLET(2.5 MG) BY MOUTH DAILY 90 tablet 1   mirtazapine (REMERON) 15 MG tablet Take 1 tablet (15 mg total) by mouth at bedtime. 30 tablet 3   ondansetron (ZOFRAN) 8 MG tablet One pill every 8 hours as needed for nausea/vomitting. 40 tablet 1   prochlorperazine (COMPAZINE) 10 MG tablet TAKE 1 TABLET(10 MG) BY MOUTH EVERY 6 HOURS AS NEEDED FOR NAUSEA OR VOMITING 40 tablet 1   lidocaine-prilocaine (EMLA) cream Apply on the port. 30 -45 min  prior to port access. 30 g 3   meloxicam (MOBIC) 15 MG tablet TAKE 1 TABLET(15 MG) BY MOUTH DAILY (Patient not taking: Reported on 08/01/2022) 90 tablet 1   methocarbamol (ROBAXIN) 500 MG tablet Take 1 tablet (500 mg total) by mouth in the morning and at bedtime. (Patient not taking: Reported on 08/01/2022) 60 tablet 1   omeprazole (PRILOSEC OTC) 20 MG tablet Take 2 tablets (40 mg total) by mouth daily. 60 tablet 0    predniSONE (DELTASONE) 10 MG tablet Take 1 tablet (10 mg total) by mouth daily with breakfast. 30 tablet 0   No current facility-administered medications for this visit.    PHYSICAL EXAMINATION: ECOG PERFORMANCE STATUS: 0 - Asymptomatic  BP (!) 141/62 (BP Location: Right Arm, Patient Position: Sitting, Cuff Size: Normal)   Pulse 71   Temp (!) 97 F (36.1 C) (Tympanic)   Ht 5' 5"  (1.651 m)   Wt 137 lb 11.2 oz (62.5 kg)   SpO2 98%   BMI 22.91 kg/m   Filed Weights   08/17/22 0823  Weight: 137 lb 11.2 oz (62.5 kg)        Physical Exam HENT:     Head: Normocephalic and atraumatic.     Mouth/Throat:     Pharynx: No oropharyngeal exudate.  Eyes:     Pupils: Pupils are equal, round, and reactive to light.  Cardiovascular:     Rate and Rhythm: Normal rate and regular rhythm.  Pulmonary:     Effort: No respiratory distress.     Breath sounds: No wheezing.  Abdominal:     General: Bowel sounds are normal. There is no distension.     Palpations: Abdomen is soft. There is no mass.     Tenderness: There is no abdominal tenderness. There is no guarding or rebound.  Musculoskeletal:        General: No tenderness. Normal range of motion.     Cervical back: Normal range of motion and neck supple.  Skin:    General: Skin is warm.  Neurological:     Mental Status: She is alert and oriented to person, place, and time.  Psychiatric:        Mood and Affect: Affect normal.    LABORATORY DATA:  I have reviewed the data as listed    Component Value Date/Time   NA 137 08/17/2022 0827   NA 139 05/30/2014 0127   K 3.4 (L) 08/17/2022 0827   K 3.7 05/30/2014 0127   CL 104 08/17/2022 0827   CL 106 05/30/2014 0127   CO2 24 08/17/2022 0827   CO2 25 05/30/2014 0127   GLUCOSE 226 (H) 08/17/2022 0827   GLUCOSE 136 (H) 05/30/2014 0127   BUN 17 08/17/2022 0827   BUN 19 (H) 05/30/2014 0127   CREATININE 1.31 (H) 08/17/2022 0827   CREATININE 0.98 05/30/2014 0127   CALCIUM 8.7 (L)  08/17/2022 0827   CALCIUM 9.0 05/30/2014 0127   PROT 7.4 08/17/2022  0827   PROT 7.8 05/08/2014 1131   ALBUMIN 3.4 (L) 08/17/2022 0827   ALBUMIN 3.7 05/08/2014 1131   AST 80 (H) 08/17/2022 0827   AST 20 05/08/2014 1131   ALT 44 08/17/2022 0827   ALT 18 05/08/2014 1131   ALKPHOS 151 (H) 08/17/2022 0827   ALKPHOS 58 05/08/2014 1131   BILITOT 0.6 08/17/2022 0827   BILITOT 0.4 05/08/2014 1131   GFRNONAA 41 (L) 08/17/2022 0827   GFRNONAA 58 (L) 05/30/2014 0127   GFRAA 47 (L) 09/02/2020 0920   GFRAA >60 05/30/2014 0127    No results found for: "SPEP", "UPEP"  Lab Results  Component Value Date   WBC 5.9 08/17/2022   NEUTROABS 2.7 08/17/2022   HGB 9.9 (L) 08/17/2022   HCT 31.1 (L) 08/17/2022   MCV 87.4 08/17/2022   PLT 251 08/17/2022      Chemistry      Component Value Date/Time   NA 137 08/17/2022 0827   NA 139 05/30/2014 0127   K 3.4 (L) 08/17/2022 0827   K 3.7 05/30/2014 0127   CL 104 08/17/2022 0827   CL 106 05/30/2014 0127   CO2 24 08/17/2022 0827   CO2 25 05/30/2014 0127   BUN 17 08/17/2022 0827   BUN 19 (H) 05/30/2014 0127   CREATININE 1.31 (H) 08/17/2022 0827   CREATININE 0.98 05/30/2014 0127      Component Value Date/Time   CALCIUM 8.7 (L) 08/17/2022 0827   CALCIUM 9.0 05/30/2014 0127   ALKPHOS 151 (H) 08/17/2022 0827   ALKPHOS 58 05/08/2014 1131   AST 80 (H) 08/17/2022 0827   AST 20 05/08/2014 1131   ALT 44 08/17/2022 0827   ALT 18 05/08/2014 1131   BILITOT 0.6 08/17/2022 0827   BILITOT 0.4 05/08/2014 1131         RADIOGRAPHIC STUDIES: I have personally reviewed the radiological images as listed and agreed with the findings in the report. No results found.   ASSESSMENT & PLAN:   Malignant neoplasm of overlapping sites of left breast in female, estrogen receptor positive ([place]) #Left breast ipsilateral chest wall recurrence stage IV [2015]-bone/liver metastases/lung nodules ; CT scan JUNE 18th, 2023- Multiple subpleural pulmonary nodules; subtly  hypodense lesion of the anterior left lobe of the liver; Unchanged, widespread sclerotic osseous metastatic disease involving the axial skeleton;  Liver biopsy- 7/12- positive for breast cancer; ER positive. FISH- Her 2 LOW [+1]; NGS- PIC3K POSITIVE.  Most recently on Taxol weekly.   #Discontinue Taxol because of progression; intolerance to therapy [fall/neuropathy/worsening fatigue etc.]  #Today proceed with Enhertu cycle #1. Labs today reviewed;  acceptable for treatment today.  JUNE 9th, 2023- 2D echo-EF  55-60%.  Again reviewed the potential side effects including but not limited to nausea vomiting interstitial lung disease etc.  #Fatigue multifactorial-chemotherapy.  Continue prednisone 10 mg a day.  . Pt declines MRI Brain.   # PN-2-3 sec to Taxol- monitor for now; discontinued Taxol chemotherapy.  Consider Neurontin. STABLE.   #Elevated blood sugars [?  Prediabetes]-monitor closely on steroids/dexamethasone premedication.  Continues evaluated recommend metformin.  # Hypocalcemia-continue calcium plus vitamin D twice daily; vitamin D 25-OH- 16 [June 2023]; currently on Ergocaliferol 50,000 unit/weekly.    # Bilateral hip pain- arthritis-[s/p ortho evaluation]; on meloxicam/robaxin- refilled.   STABLE.   # HTN- continue amlodipine/Lisinopril-systolic blood pressure 161W.  Monitor closely.  STABLE.   # Bone lesions: discussed zometa. Recommend ca [500]+vit D [1000 IU]. HOLD Zometa q 4 weeks; STABLE.   # CKD-  stage III- GFR 48-50- STABLE; mild hypokalemia potassium 3.3-.   # Anxiety/depression- Celexa; DC Celexa.  But switch to remeron-for apetite.   # Weight loss-awaiting reevaluation with nutrition today.  Add remeron as above.   ? Zometa q4 w;   # DISPOSITION: Thursday/pref # Proceed with Enhertu today # follow up in 1 week- NP-labs- cbc/bmp; Possible IVFs over 1 hour # follow up in 3 weeks MD; labs- cbc/cmp; cea; ca27-29; Enertu  - Dr.B              [name], MD 08/17/2022 8:00 PM

## 2022-08-17 NOTE — Progress Notes (Signed)
C/o weakness more so the last week and a half.

## 2022-08-17 NOTE — Patient Instructions (Signed)
[facility] [place]  Discharge Instructions: Thank you for choosing [facility] to provide your oncology and hematology care.  If you have a lab appointment with the [facility], please go directly to the [facility] and check in at the registration area.  Wear comfortable clothing and clothing appropriate for easy access to any Portacath or PICC line.   We strive to give you quality time with your provider. You may need to reschedule your appointment if you arrive late (15 or more minutes).  Arriving late affects you and other patients whose appointments are after yours.  Also, if you miss three or more appointments without notifying the office, you may be dismissed from the clinic at the provider's discretion.      For prescription refill requests, have your pharmacy contact our office and allow 72 hours for refills to be completed.    Today you received the following chemotherapy and/or immunotherapy agents Enhertu      To help prevent nausea and vomiting after your treatment, we encourage you to take your nausea medication as directed.  BELOW ARE SYMPTOMS THAT SHOULD BE REPORTED IMMEDIATELY: *FEVER GREATER THAN 100.4 F (38 C) OR HIGHER *CHILLS OR SWEATING *NAUSEA AND VOMITING THAT IS NOT CONTROLLED WITH YOUR NAUSEA MEDICATION *UNUSUAL SHORTNESS OF BREATH *UNUSUAL BRUISING OR BLEEDING *URINARY PROBLEMS (pain or burning when urinating, or frequent urination) *BOWEL PROBLEMS (unusual diarrhea, constipation, pain near the anus) TENDERNESS IN MOUTH AND THROAT WITH OR WITHOUT PRESENCE OF ULCERS (sore throat, sores in mouth, or a toothache) UNUSUAL RASH, SWELLING OR PAIN  UNUSUAL VAGINAL DISCHARGE OR ITCHING   Items with * indicate a potential emergency and should be followed up as soon as possible or go to the Emergency Department if any problems should occur.  Please show the CHEMOTHERAPY ALERT CARD or IMMUNOTHERAPY ALERT CARD at check-in to the  Emergency Department and triage nurse.  Should you have questions after your visit or need to cancel or reschedule your appointment, please contact [facility] [place] AT [place]  [phone] and follow the prompts.  Office hours are 8:00 a.m. to 4:30 p.m. Monday - Friday. Please note that voicemails left after 4:00 p.m. may not be returned until the following business day.  We are closed weekends and major holidays. You have access to a nurse at all times for urgent questions. Please call the main number to the clinic [phone] and follow the prompts.  For any non-urgent questions, you may also contact your provider using MyChart. We now offer e-Visits for anyone 80 and older to request care online for non-urgent symptoms. For details visit [email or web address].   Also download the MyChart app! Go to the app store, search "MyChart", open the app, select , and log in with your MyChart username and password.  Masks are optional in the cancer centers. If you would like for your care team to wear a mask while they are taking care of you, please let them know. For doctor visits, patients may have with them one support person who is at least 80 years old. At this time, visitors are not allowed in the infusion area.    Fam-Trastuzumab Deruxtecan Injection What is this medication? FAM-TRASTUZUMAB DERUXTECAN (fam-tras TOOZ eu mab DER ux TEE kan) treats some types of cancer. It works by blocking a protein that causes cancer cells to grow and multiply. This helps to slow or stop the spread of cancer cells. This medicine may be used for other  purposes; ask your health care provider or pharmacist if you have questions. COMMON BRAND NAME(S): ENHERTU What should I tell my care team before I take this medication? They need to know if you have any of these conditions: Heart disease Heart failure Infection, especially a viral infection, such as chickenpox, cold sores, or  herpes Liver disease Lung or breathing disease, such as asthma or COPD An unusual or allergic reaction to fam-trastuzumab deruxtecan, other medications, foods, dyes, or preservatives Pregnant or trying to get pregnant Breast-feeding How should I use this medication? This medication is injected into a vein. It is given by your care team in a hospital or clinic setting. A special MedGuide will be given to you before each treatment. Be sure to read this information carefully each time. Talk to your care team about the use of this medication in children. Special care may be needed. Overdosage: If you think you have taken too much of this medicine contact a poison control center or emergency room at once. NOTE: This medicine is only for you. Do not share this medicine with others. What if I miss a dose? It is important not to miss your dose. Call your care team if you are unable to keep an appointment. What may interact with this medication? Interactions are not expected. This list may not describe all possible interactions. Give your health care provider a list of all the medicines, herbs, non-prescription drugs, or dietary supplements you use. Also tell them if you smoke, drink alcohol, or use illegal drugs. Some items may interact with your medicine. What should I watch for while using this medication? Visit your care team for regular checks on your progress. Tell your care team if your symptoms do not start to get better or if they get worse. Your condition will be monitored carefully while you are receiving this medication. Do not become pregnant while taking this medication or for 7 months after stopping it. Women should inform their care team if they wish to become pregnant or think they might be pregnant. Men should not father a child while taking this medication and for 4 months after stopping it. There is potential for serious side effects to an unborn child. Talk to your care team for more  information. Do not breast-feed an infant while taking this medication or for 7 months after the last dose. This medication has caused decreased sperm counts in some men. This may make it more difficult to father a child. Talk to your care team if you are concerned about your fertility. This medication may increase your risk to bruise or bleed. Call your care team if you notice any unusual bleeding. Be careful brushing or flossing your teeth or using a toothpick because you may get an infection or bleed more easily. If you have any dental work done, tell your dentist you are receiving this medication. This medication may cause dry eyes and blurred vision. If you wear contact lenses, you may feel some discomfort. Lubricating eye drops may help. See your care team if the problem does not go away or is severe. This medication may increase your risk of getting an infection. Call your care team for advice if you get a fever, chills, sore throat, or other symptoms of a cold or flu. Do not treat yourself. Try to avoid being around people who are sick. Avoid taking medications that contain aspirin, acetaminophen, ibuprofen, naproxen, or ketoprofen unless instructed by your care team. These medications may hide a fever.  What side effects may I notice from receiving this medication? Side effects that you should report to your care team as soon as possible: Allergic reactions--skin rash, itching, hives, swelling of the face, lips, tongue, or throat Dry cough, shortness of breath or trouble breathing Infection--fever, chills, cough, sore throat, wounds that don't heal, pain or trouble when passing urine, general feeling of discomfort or being unwell Heart failure--shortness of breath, swelling of the ankles, feet, or hands, sudden weight gain, unusual weakness or fatigue Unusual bruising or bleeding Side effects that usually do not require medical attention (report these to your care team if they continue or are  bothersome): Constipation Diarrhea Hair loss Muscle pain Nausea Vomiting This list may not describe all possible side effects. Call your doctor for medical advice about side effects. You may report side effects to FDA at 1-800-FDA-1088. Where should I keep my medication? This medication is given in a hospital or clinic. It will not be stored at home. NOTE: This sheet is a summary. It may not cover all possible information. If you have questions about this medicine, talk to your doctor, pharmacist, or health care provider.  2023 Elsevier/Gold Standard (2021-08-24 00:00:00)

## 2022-08-18 ENCOUNTER — Other Ambulatory Visit: Payer: Self-pay

## 2022-08-18 LAB — CEA: CEA: 14.7 ng/mL — ABNORMAL HIGH (ref 0.0–4.7)

## 2022-08-18 LAB — CANCER ANTIGEN 27.29: CA 27.29: 332.5 U/mL — ABNORMAL HIGH (ref 0.0–38.6)

## 2022-08-19 ENCOUNTER — Other Ambulatory Visit: Payer: Self-pay

## 2022-08-24 ENCOUNTER — Encounter: Payer: Self-pay | Admitting: Internal Medicine

## 2022-08-24 ENCOUNTER — Inpatient Hospital Stay: Payer: Medicare HMO

## 2022-08-24 ENCOUNTER — Other Ambulatory Visit: Payer: Self-pay

## 2022-08-24 ENCOUNTER — Telehealth: Payer: Self-pay | Admitting: *Deleted

## 2022-08-24 ENCOUNTER — Inpatient Hospital Stay (HOSPITAL_BASED_OUTPATIENT_CLINIC_OR_DEPARTMENT_OTHER): Payer: Medicare HMO | Admitting: Hospice and Palliative Medicine

## 2022-08-24 ENCOUNTER — Other Ambulatory Visit: Payer: Self-pay | Admitting: *Deleted

## 2022-08-24 VITALS — BP 134/67 | HR 91 | Temp 99.5°F | Resp 18 | Ht 65.0 in | Wt 136.0 lb

## 2022-08-24 DIAGNOSIS — Z5112 Encounter for antineoplastic immunotherapy: Secondary | ICD-10-CM | POA: Diagnosis not present

## 2022-08-24 DIAGNOSIS — C7989 Secondary malignant neoplasm of other specified sites: Secondary | ICD-10-CM | POA: Diagnosis not present

## 2022-08-24 DIAGNOSIS — C50812 Malignant neoplasm of overlapping sites of left female breast: Secondary | ICD-10-CM

## 2022-08-24 DIAGNOSIS — R509 Fever, unspecified: Secondary | ICD-10-CM

## 2022-08-24 DIAGNOSIS — C7951 Secondary malignant neoplasm of bone: Secondary | ICD-10-CM | POA: Diagnosis not present

## 2022-08-24 DIAGNOSIS — C7800 Secondary malignant neoplasm of unspecified lung: Secondary | ICD-10-CM | POA: Diagnosis not present

## 2022-08-24 DIAGNOSIS — C787 Secondary malignant neoplasm of liver and intrahepatic bile duct: Secondary | ICD-10-CM | POA: Diagnosis not present

## 2022-08-24 DIAGNOSIS — R112 Nausea with vomiting, unspecified: Secondary | ICD-10-CM | POA: Diagnosis not present

## 2022-08-24 DIAGNOSIS — R197 Diarrhea, unspecified: Secondary | ICD-10-CM | POA: Diagnosis not present

## 2022-08-24 DIAGNOSIS — Z95828 Presence of other vascular implants and grafts: Secondary | ICD-10-CM

## 2022-08-24 DIAGNOSIS — Z170 Estrogen receptor positive status [ER+]: Secondary | ICD-10-CM

## 2022-08-24 DIAGNOSIS — E860 Dehydration: Secondary | ICD-10-CM | POA: Insufficient documentation

## 2022-08-24 LAB — CBC WITH DIFFERENTIAL/PLATELET
Abs Immature Granulocytes: 0.04 10*3/uL (ref 0.00–0.07)
Basophils Absolute: 0 10*3/uL (ref 0.0–0.1)
Basophils Relative: 0 %
Eosinophils Absolute: 0 10*3/uL (ref 0.0–0.5)
Eosinophils Relative: 1 %
HCT: 30.8 % — ABNORMAL LOW (ref 36.0–46.0)
Hemoglobin: 9.7 g/dL — ABNORMAL LOW (ref 12.0–15.0)
Immature Granulocytes: 1 %
Lymphocytes Relative: 30 %
Lymphs Abs: 1.7 10*3/uL (ref 0.7–4.0)
MCH: 27.1 pg (ref 26.0–34.0)
MCHC: 31.5 g/dL (ref 30.0–36.0)
MCV: 86 fL (ref 80.0–100.0)
Monocytes Absolute: 0.3 10*3/uL (ref 0.1–1.0)
Monocytes Relative: 6 %
Neutro Abs: 3.5 10*3/uL (ref 1.7–7.7)
Neutrophils Relative %: 62 %
Platelets: 196 10*3/uL (ref 150–400)
RBC: 3.58 MIL/uL — ABNORMAL LOW (ref 3.87–5.11)
RDW: 16.4 % — ABNORMAL HIGH (ref 11.5–15.5)
WBC: 5.7 10*3/uL (ref 4.0–10.5)
nRBC: 0 % (ref 0.0–0.2)

## 2022-08-24 LAB — COMPREHENSIVE METABOLIC PANEL
ALT: 20 U/L (ref 0–44)
AST: 42 U/L — ABNORMAL HIGH (ref 15–41)
Albumin: 2.9 g/dL — ABNORMAL LOW (ref 3.5–5.0)
Alkaline Phosphatase: 173 U/L — ABNORMAL HIGH (ref 38–126)
Anion gap: 11 (ref 5–15)
BUN: 25 mg/dL — ABNORMAL HIGH (ref 8–23)
CO2: 23 mmol/L (ref 22–32)
Calcium: 8.7 mg/dL — ABNORMAL LOW (ref 8.9–10.3)
Chloride: 102 mmol/L (ref 98–111)
Creatinine, Ser: 1.07 mg/dL — ABNORMAL HIGH (ref 0.44–1.00)
GFR, Estimated: 53 mL/min — ABNORMAL LOW (ref >60)
Glucose, Bld: 209 mg/dL — ABNORMAL HIGH (ref 70–99)
Potassium: 3.6 mmol/L (ref 3.5–5.1)
Sodium: 136 mmol/L (ref 135–145)
Total Bilirubin: 0.8 mg/dL (ref 0.3–1.2)
Total Protein: 7.2 g/dL (ref 6.5–8.1)

## 2022-08-24 LAB — MAGNESIUM: Magnesium: 2.2 mg/dL (ref 1.7–2.4)

## 2022-08-24 MED ORDER — SODIUM CHLORIDE 0.9% FLUSH
10.0000 mL | Freq: Once | INTRAVENOUS | Status: AC
  Administered 2022-08-24: 10 mL via INTRAVENOUS
  Filled 2022-08-24: qty 10

## 2022-08-24 MED ORDER — HEPARIN SOD (PORK) LOCK FLUSH 100 UNIT/ML IV SOLN
500.0000 [IU] | Freq: Once | INTRAVENOUS | Status: AC
  Administered 2022-08-24: 500 [IU]
  Filled 2022-08-24: qty 5

## 2022-08-24 MED ORDER — SODIUM CHLORIDE 0.9 % IV SOLN
INTRAVENOUS | Status: DC
  Filled 2022-08-24 (×2): qty 250

## 2022-08-24 NOTE — Telephone Encounter (Signed)
Patient will be coming in at 1245 instead of 145 per Lorenz Coaster that we can move appointment time up. Jessica Compton said she will have her there 1245

## 2022-08-24 NOTE — Progress Notes (Signed)
Symptom Management [place] at [facility] [phone] [phone]  Patient Care Team: Pcp, No as PCP - General Byrnett, Forest Gleason, MD (General Surgery) Marden Noble, MD (Internal Medicine) [name], MD as Consulting Physician (Internal Medicine)   NAME OF PATIENT: Jessica Compton  [id number]  1941/09/07   DATE OF VISIT: 08/24/22  REASON FOR CONSULT: Jessica Compton is a 81 y.o. female with multiple medical problems including stage IV ER/PR positive left breast cancer with metastasis to bone and liver and lung.  Patient was on treatment with Taxol but this was discontinued due to progression and overall poor tolerance with weakness and decline.  She was rotated to Enhertu.   INTERVAL HISTORY: Patient saw Dr. Rogue Bussing on 08/17/2022 and received cycle 1 Enhertu.  Patient presents [facility] today for evaluation of progressive weakness, nausea, diarrhea since she received chemotherapy.  Patient reports that she had 1 episode of vomiting over the weekend.  She has been trying to push oral fluids but finds that that triggers diarrhea.  Diarrhea is reportedly nonbloody without mucus and is loose/watery.  She denies fever or chills but was noted to have a low-grade temp in clinic.  No urinary symptoms.  No cough, congestion, shortness of breath.  Overall, patient just feels fatigued and weak and says she is not interested in repeating additional cycles of Enhertu.  Denies any neurologic complaints. Denies recent fevers or illnesses. Denies any easy bleeding or bruising.  Denies urinary complaints. Patient offers no further specific complaints today.   PAST MEDICAL HISTORY: Past Medical History:  Diagnosis Date   Aneurysm ([place]) 2007   Arthritis    Cancer ([place]) 2007   diagnosed 01/07 left breast with simple mastectomu & sn bx. The pt had a 3.5cm tumor. Frozen section report on the 2 sn were negative. On permanent sections a 0.34m  micrometastasis was identified. She was not felt to require a complete axillary dissection. She has completed her Tamoxifen therapy and has been released from routine f/u with medical oncology service.   COPD (chronic obstructive pulmonary disease) (HCC)    Diabetes mellitus without complication ([facility]    Hyperlipidemia    Hypertension    since age 81  Malignant neoplasm of upper-outer quadrant of female breast ([place] 12/[place]   Mastectomy site recurrence resected 01/26/2014, ER 90%, PR 0%, HER-2/neu nonamplified.   Other benign neoplasm of connective and other soft tissue of trunk, unspecified 2014   Personal history of colonic polyps    Personal history of tobacco use, presenting hazards to health    Special screening for malignant neoplasms, colon    Unspecified disorder of skin and subcutaneous tissue 02/19/2013   biopsy of skin over the sternum on the right breast showed hypertrophic scar,keloid formation.    PAST SURGICAL HISTORY:  Past Surgical History:  Procedure Laterality Date   ABDOMINAL HYSTERECTOMY  2004   total   BREAST BIOPSY Right January 26, 2014   Core biopsy for mild increase breast uptake on PET scan, usual ductal hyperplasia and stromal fibrosis   BREAST SURGERY Left 2007   mastectomy   chest wall mass  01/26/14   CHOLECYSTECTOMY  2007   COLONOSCOPY W/ POLYPECTOMY  2011   Dr. EBrynda Greathouse  COLONOSCOPY WITH PROPOFOL N/A 12/22/2021   Procedure: COLONOSCOPY WITH PROPOFOL;  Surgeon: AJonathon Bellows MD;  [facility]  Service: Gastroenterology;  Laterality: N/A;   ESOPHAGOGASTRODUODENOSCOPY (EGD) WITH PROPOFOL N/A 07/08/2022   Procedure: ESOPHAGOGASTRODUODENOSCOPY (  EGD) WITH PROPOFOL;  Surgeon: Lesly Rubenstein, MD;  Location: [facility]COPY;  Service: Endoscopy;  Laterality: N/A;   EYE SURGERY Right    cataract surgery   head surgery  1991   IR IMAGING GUIDED PORT INSERTION  03/13/2022   MASTECTOMY Left 2007   [facility] REMOVAL  2008   PORTACATH PLACEMENT   2007    HEMATOLOGY/ONCOLOGY HISTORY:  Oncology History Overview Note  # 2007- LEFT BREAST CA STAGE II [T2N27m; s/p mastec; Dr.Byrnett] ER-Pos/PR Neg; Her- 2 Neu- NEG; ACx4-Taxol x12; Femara; stopped May 2009-stopped follow up.  #JAN 2015- Post mastectomy Chest wall recurrence [chest wall mass bx-]; ER- 90%; PR- NEG; Her2 Neu- NEG; s/p excision [involving skeletal muscle/adipose tissue; clear margins]; CT July 2017- NED;   # Jan 2020- progression/ hilar- START abema [jan 14th]+ faslodex[jan 13th]; OCT-NOV 2022-PET scan shows progressive disease in the bones to liver.  Discontinue Abema+ Faslodex  # DEC 1st, 2022- START Xeloda 2w/1w; MARCH 10th, 2023-progressive disease-lung bone liver  # end of March 2023- taxol weekly; Taxol held since June 1st, 2023-neuropathy     Liver biopsy- 07/05/2022- -preliminary positive for breast cancer; ER positive.  her-2 by FISH +1 [LOW]and NGS- PIC3K POSITIVE  # AUG 24th, 2023- Enhertu.   # DEC 2021- anemia/? CKD [stool ];#Ascending colon mass-incidentally noted on PET scan.  S/p  Dr. AVicente Males  colonoscpy DEC 2022- NEGATIVE; reviewed small adenomas.   # OSTEOPENIA [BMD- June 2016]  # Kidney cysts  DIAGNOSIS: LEFT BREAST CA  STAGE: IV- NED  ;GOALS: pallaitive      Breast cancer metastasized to skin ([facility]  02/05/2014 Initial Diagnosis   Breast cancer metastasized to skin ([facility]   Malignant neoplasm of overlapping sites of left breast in female, estrogen receptor positive ([facility] [place]  07/24/2016 Initial Diagnosis   Cancer of overlapping sites of left female breast ([facility]   01/06/2019 - 08/05/2020 Chemotherapy   Patient is on Treatment Plan : BREAST Abemaciclib + Fulvestrant q28d     03/22/2022 - 05/25/2022 Chemotherapy   Patient is on Treatment Plan : BREAST Paclitaxel D1,8,15 q28d     08/17/2022 - 08/17/2022 Chemotherapy   Patient is on Treatment Plan : BREAST METASTATIC fam-trastuzumab deruxtecan-nxki (Enhertu) q21d     08/17/2022 -  Chemotherapy   Patient is  on Treatment Plan : BREAST METASTATIC Fam-Trastuzumab Deruxtecan-nxki (Enhertu) (5.4) q21d       ALLERGIES:  is allergic to metoprolol.  MEDICATIONS:  Current Outpatient Medications  Medication Sig Dispense Refill   amLODipine (NORVASC) 10 MG tablet TAKE 1 TABLET(10 MG) BY MOUTH DAILY 90 tablet 1   atorvastatin (LIPITOR) 20 MG tablet Take 20 mg by mouth daily at 6 PM.     ergocalciferol (VITAMIN D2) 1.25 MG (50000 UT) capsule Take 1 capsule (50,000 Units total) by mouth once a week. 12 capsule 1   lidocaine-prilocaine (EMLA) cream Apply on the port. 30 -45 min  prior to port access. 30 g 3   lisinopril (ZESTRIL) 2.5 MG tablet TAKE 1 TABLET(2.5 MG) BY MOUTH DAILY 90 tablet 1   meloxicam (MOBIC) 15 MG tablet TAKE 1 TABLET(15 MG) BY MOUTH DAILY (Patient not taking: Reported on 08/01/2022) 90 tablet 1   methocarbamol (ROBAXIN) 500 MG tablet Take 1 tablet (500 mg total) by mouth in the morning and at bedtime. (Patient not taking: Reported on 08/01/2022) 60 tablet 1   mirtazapine (REMERON) 15 MG tablet Take 1 tablet (15 mg total) by mouth at bedtime. 30 tablet 3   omeprazole (PRILOSEC  OTC) 20 MG tablet Take 2 tablets (40 mg total) by mouth daily. 60 tablet 0   ondansetron (ZOFRAN) 8 MG tablet One pill every 8 hours as needed for nausea/vomitting. 40 tablet 1   predniSONE (DELTASONE) 10 MG tablet Take 1 tablet (10 mg total) by mouth daily with breakfast. 30 tablet 0   prochlorperazine (COMPAZINE) 10 MG tablet TAKE 1 TABLET(10 MG) BY MOUTH EVERY 6 HOURS AS NEEDED FOR NAUSEA OR VOMITING 40 tablet 1   No current facility-administered medications for this visit.   Facility-Administered Medications Ordered in Other Visits  Medication Dose Route Frequency Provider Last Rate Last Admin   0.9 %  sodium chloride infusion   Intravenous Continuous Missy Baksh, Kirt Boys, NP 999 mL/hr at 08/24/22 1319 New Bag at 08/24/22 1319    VITAL SIGNS: There were no vitals taken for this visit. There were no vitals filed  for this visit.  Estimated body mass index is 22.63 kg/m as calculated from the following:   Height as of an earlier encounter on 08/24/22: _0  (1.651 m).   Weight as of an earlier encounter on 08/24/22: 136 lb (61.7 kg).  LABS: CBC:    Component Value Date/Time   WBC 5.7 08/24/2022 1310   HGB 9.7 (L) 08/24/2022 1310   HGB 11.8 (L) 05/30/2014 0127   HCT 30.8 (L) 08/24/2022 1310   HCT 37.3 05/30/2014 0127   PLT 196 08/24/2022 1310   PLT 193 05/30/2014 0127   MCV 86.0 08/24/2022 1310   MCV 90 05/30/2014 0127   NEUTROABS 3.5 08/24/2022 1310   NEUTROABS 5.6 05/30/2014 0127   LYMPHSABS 1.7 08/24/2022 1310   LYMPHSABS 1.7 05/30/2014 0127   MONOABS 0.3 08/24/2022 1310   MONOABS 0.7 05/30/2014 0127   EOSABS 0.0 08/24/2022 1310   EOSABS 0.1 05/30/2014 0127   BASOSABS 0.0 08/24/2022 1310   BASOSABS 0.0 05/30/2014 0127   Comprehensive Metabolic Panel:    Component Value Date/Time   NA 136 08/24/2022 1310   NA 139 05/30/2014 0127   K 3.6 08/24/2022 1310   K 3.7 05/30/2014 0127   CL 102 08/24/2022 1310   CL 106 05/30/2014 0127   CO2 23 08/24/2022 1310   CO2 25 05/30/2014 0127   BUN 25 (H) 08/24/2022 1310   BUN 19 (H) 05/30/2014 0127   CREATININE 1.07 (H) 08/24/2022 1310   CREATININE 0.98 05/30/2014 0127   GLUCOSE 209 (H) 08/24/2022 1310   GLUCOSE 136 (H) 05/30/2014 0127   CALCIUM 8.7 (L) 08/24/2022 1310   CALCIUM 9.0 05/30/2014 0127   AST 42 (H) 08/24/2022 1310   AST 20 05/08/2014 1131   ALT 20 08/24/2022 1310   ALT 18 05/08/2014 1131   ALKPHOS 173 (H) 08/24/2022 1310   ALKPHOS 58 05/08/2014 1131   BILITOT 0.8 08/24/2022 1310   BILITOT 0.4 05/08/2014 1131   PROT 7.2 08/24/2022 1310   PROT 7.8 05/08/2014 1131   ALBUMIN 2.9 (L) 08/24/2022 1310   ALBUMIN 3.7 05/08/2014 1131    RADIOGRAPHIC STUDIES: No results found.  PERFORMANCE STATUS (ECOG) : 3 - Symptomatic, >50% confined to bed  Review of Systems Unless otherwise noted, a complete review of systems is  negative.  Physical Exam General: NAD Cardiovascular: regular rate and rhythm Pulmonary: clear anterior/posterior fields Abdomen: soft, nontender, + bowel sounds GU: no suprapubic tenderness Extremities: no edema, no joint deformities Skin: no rashes Neurological: Weakness but otherwise nonfocal  IMPRESSION/PLAN: Nausea/vomiting/diarrhea -likely secondary to chemotherapy.  Labs grossly unchanged from baseline.  We will  send UA/culture and stool studies given low-grade temp in clinic but no specific symptoms are overly concerning for infectious etiology.  Proceed with IV fluids today.  Discussed utilization of antiemetics and antidiarrheals, which patient is not really using at this point.  We will bring patient back for fluid clinic tomorrow and then repeat labs/fluids/APP next week.  We will continue supportive care.  Patient plans to talk to Dr. Rogue Bussing about discontinuing Enhertu.  However, she is willing to consider other treatment options if any are available.  It seems like she has had overall poor tolerance to chemotherapy.  Consider palliative care referral.  Case and plan discussed with Dr. Rogue Bussing  Patient expressed understanding and was in agreement with this plan. She also understands that She can call clinic at any time with any questions, concerns, or complaints.   Thank you for allowing me to participate in the care of this very pleasant patient.   Time Total: 15 minutes  Visit consisted of counseling and education dealing with the complex and emotionally intense issues of symptom management in the setting of serious illness.Greater than 50%  of this time was spent counseling and coordinating care related to the above assessment and plan.  Signed by: Altha Harm, PhD, NP-C

## 2022-08-24 NOTE — Telephone Encounter (Signed)
Jessica Compton patient daughter called stating that has an appointment today and patient is not doing well. She is very weak, can hardly walk to bathroom. She is nauseated all the time She fells she may need IV fluids today. She also states that she doe snot think patient is going to be able to continue taking this chemotherapy she is on. She wants Dr B to know this before her appointment today. She is scheduled to see Sharion Dove, NP today. She had asked if more than 1 person could come to appointment ith her an dI informed her that staff was told yesterday only 1 person and no exceptions. But that she could be conferenced in to visit

## 2022-08-24 NOTE — Patient Instructions (Signed)
Return to clinic tomorrow as scheduled for additional IV fluids.

## 2022-08-25 ENCOUNTER — Inpatient Hospital Stay: Payer: Medicare HMO | Attending: Internal Medicine

## 2022-08-25 VITALS — BP 145/62 | HR 81 | Temp 97.8°F

## 2022-08-25 DIAGNOSIS — Z170 Estrogen receptor positive status [ER+]: Secondary | ICD-10-CM | POA: Diagnosis not present

## 2022-08-25 DIAGNOSIS — Z79899 Other long term (current) drug therapy: Secondary | ICD-10-CM | POA: Diagnosis not present

## 2022-08-25 DIAGNOSIS — C7951 Secondary malignant neoplasm of bone: Secondary | ICD-10-CM | POA: Diagnosis not present

## 2022-08-25 DIAGNOSIS — E860 Dehydration: Secondary | ICD-10-CM

## 2022-08-25 DIAGNOSIS — R509 Fever, unspecified: Secondary | ICD-10-CM

## 2022-08-25 DIAGNOSIS — N183 Chronic kidney disease, stage 3 unspecified: Secondary | ICD-10-CM | POA: Insufficient documentation

## 2022-08-25 DIAGNOSIS — C787 Secondary malignant neoplasm of liver and intrahepatic bile duct: Secondary | ICD-10-CM | POA: Insufficient documentation

## 2022-08-25 DIAGNOSIS — C792 Secondary malignant neoplasm of skin: Secondary | ICD-10-CM | POA: Diagnosis not present

## 2022-08-25 DIAGNOSIS — E876 Hypokalemia: Secondary | ICD-10-CM | POA: Diagnosis not present

## 2022-08-25 DIAGNOSIS — C50812 Malignant neoplasm of overlapping sites of left female breast: Secondary | ICD-10-CM | POA: Diagnosis not present

## 2022-08-25 LAB — URINALYSIS, COMPLETE (UACMP) WITH MICROSCOPIC
Bilirubin Urine: NEGATIVE
Glucose, UA: NEGATIVE mg/dL
Hgb urine dipstick: NEGATIVE
Ketones, ur: NEGATIVE mg/dL
Nitrite: NEGATIVE
Protein, ur: NEGATIVE mg/dL
Specific Gravity, Urine: 1.016 (ref 1.005–1.030)
pH: 5 (ref 5.0–8.0)

## 2022-08-25 MED ORDER — SODIUM CHLORIDE 0.9% FLUSH
10.0000 mL | Freq: Once | INTRAVENOUS | Status: AC
  Administered 2022-08-25: 10 mL via INTRAVENOUS
  Filled 2022-08-25: qty 10

## 2022-08-25 MED ORDER — HEPARIN SOD (PORK) LOCK FLUSH 100 UNIT/ML IV SOLN
500.0000 [IU] | Freq: Once | INTRAVENOUS | Status: AC
  Administered 2022-08-25: 500 [IU]
  Filled 2022-08-25: qty 5

## 2022-08-25 MED ORDER — SODIUM CHLORIDE 0.9 % IV SOLN
INTRAVENOUS | Status: DC
  Filled 2022-08-25 (×2): qty 250

## 2022-08-28 LAB — URINE CULTURE: Culture: 30000 — AB

## 2022-08-29 ENCOUNTER — Telehealth: Payer: Self-pay | Admitting: *Deleted

## 2022-08-29 NOTE — Telephone Encounter (Signed)
Called pt to see if she was having any UTI symptoms. Per Merrily Pew, NP- pt's is not having any symptoms of a UTI. Pt instructed to continue monitoring her symptoms and to keep apt on Thursday with Lauren as planned.

## 2022-08-31 ENCOUNTER — Inpatient Hospital Stay: Payer: Medicare HMO

## 2022-08-31 ENCOUNTER — Encounter: Payer: Self-pay | Admitting: Nurse Practitioner

## 2022-08-31 ENCOUNTER — Inpatient Hospital Stay (HOSPITAL_BASED_OUTPATIENT_CLINIC_OR_DEPARTMENT_OTHER): Payer: Medicare HMO | Admitting: Nurse Practitioner

## 2022-08-31 VITALS — BP 157/70 | HR 71 | Temp 97.8°F | Resp 16 | Wt 137.8 lb

## 2022-08-31 DIAGNOSIS — C50812 Malignant neoplasm of overlapping sites of left female breast: Secondary | ICD-10-CM | POA: Diagnosis not present

## 2022-08-31 DIAGNOSIS — Z79899 Other long term (current) drug therapy: Secondary | ICD-10-CM | POA: Diagnosis not present

## 2022-08-31 DIAGNOSIS — C787 Secondary malignant neoplasm of liver and intrahepatic bile duct: Secondary | ICD-10-CM | POA: Diagnosis not present

## 2022-08-31 DIAGNOSIS — Z5189 Encounter for other specified aftercare: Secondary | ICD-10-CM | POA: Diagnosis not present

## 2022-08-31 DIAGNOSIS — N183 Chronic kidney disease, stage 3 unspecified: Secondary | ICD-10-CM | POA: Diagnosis not present

## 2022-08-31 DIAGNOSIS — Z170 Estrogen receptor positive status [ER+]: Secondary | ICD-10-CM | POA: Diagnosis not present

## 2022-08-31 DIAGNOSIS — C7951 Secondary malignant neoplasm of bone: Secondary | ICD-10-CM | POA: Diagnosis not present

## 2022-08-31 DIAGNOSIS — E876 Hypokalemia: Secondary | ICD-10-CM | POA: Diagnosis not present

## 2022-08-31 DIAGNOSIS — C792 Secondary malignant neoplasm of skin: Secondary | ICD-10-CM | POA: Diagnosis not present

## 2022-08-31 LAB — CBC WITH DIFFERENTIAL/PLATELET
Abs Immature Granulocytes: 0.07 10*3/uL (ref 0.00–0.07)
Basophils Absolute: 0 10*3/uL (ref 0.0–0.1)
Basophils Relative: 0 %
Eosinophils Absolute: 0.1 10*3/uL (ref 0.0–0.5)
Eosinophils Relative: 3 %
HCT: 29.4 % — ABNORMAL LOW (ref 36.0–46.0)
Hemoglobin: 9.3 g/dL — ABNORMAL LOW (ref 12.0–15.0)
Immature Granulocytes: 2 %
Lymphocytes Relative: 40 %
Lymphs Abs: 1.9 10*3/uL (ref 0.7–4.0)
MCH: 27.3 pg (ref 26.0–34.0)
MCHC: 31.6 g/dL (ref 30.0–36.0)
MCV: 86.2 fL (ref 80.0–100.0)
Monocytes Absolute: 0.5 10*3/uL (ref 0.1–1.0)
Monocytes Relative: 9 %
Neutro Abs: 2.3 10*3/uL (ref 1.7–7.7)
Neutrophils Relative %: 46 %
Platelets: 320 10*3/uL (ref 150–400)
RBC: 3.41 MIL/uL — ABNORMAL LOW (ref 3.87–5.11)
RDW: 16.7 % — ABNORMAL HIGH (ref 11.5–15.5)
WBC: 4.8 10*3/uL (ref 4.0–10.5)
nRBC: 2.5 % — ABNORMAL HIGH (ref 0.0–0.2)

## 2022-08-31 LAB — COMPREHENSIVE METABOLIC PANEL
ALT: 30 U/L (ref 0–44)
AST: 54 U/L — ABNORMAL HIGH (ref 15–41)
Albumin: 2.9 g/dL — ABNORMAL LOW (ref 3.5–5.0)
Alkaline Phosphatase: 155 U/L — ABNORMAL HIGH (ref 38–126)
Anion gap: 9 (ref 5–15)
BUN: 18 mg/dL (ref 8–23)
CO2: 24 mmol/L (ref 22–32)
Calcium: 8.7 mg/dL — ABNORMAL LOW (ref 8.9–10.3)
Chloride: 107 mmol/L (ref 98–111)
Creatinine, Ser: 1.03 mg/dL — ABNORMAL HIGH (ref 0.44–1.00)
GFR, Estimated: 55 mL/min — ABNORMAL LOW (ref >60)
Glucose, Bld: 218 mg/dL — ABNORMAL HIGH (ref 70–99)
Potassium: 3.3 mmol/L — ABNORMAL LOW (ref 3.5–5.1)
Sodium: 140 mmol/L (ref 135–145)
Total Bilirubin: 0.4 mg/dL (ref 0.3–1.2)
Total Protein: 6.8 g/dL (ref 6.5–8.1)

## 2022-08-31 MED ORDER — HEPARIN SOD (PORK) LOCK FLUSH 100 UNIT/ML IV SOLN
500.0000 [IU] | Freq: Once | INTRAVENOUS | Status: AC
  Administered 2022-08-31: 500 [IU] via INTRAVENOUS
  Filled 2022-08-31: qty 5

## 2022-08-31 MED ORDER — SODIUM CHLORIDE 0.9% FLUSH
10.0000 mL | Freq: Once | INTRAVENOUS | Status: AC
  Administered 2022-08-31: 10 mL via INTRAVENOUS
  Filled 2022-08-31: qty 10

## 2022-08-31 NOTE — Progress Notes (Signed)
[place] OFFICE PROGRESS NOTE  Patient Care Team: Pcp, No as PCP - General Byrnett, Forest Gleason, MD (General Surgery) Marden Noble, MD (Internal Medicine) [name], MD as Consulting Physician (Internal Medicine)   SUMMARY OF ONCOLOGIC HISTORY: Oncology History Overview Note  # 2007- LEFT BREAST CA STAGE II [T2N60m; s/p mastec; Dr.Byrnett] ER-Pos/PR Neg; Her- 2 Neu- NEG; ACx4-Taxol x12; Femara; stopped May 2009-stopped follow up.  #JAN 2015- Post mastectomy Chest wall recurrence [chest wall mass bx-]; ER- 90%; PR- NEG; Her2 Neu- NEG; s/p excision [involving skeletal muscle/adipose tissue; clear margins]; CT July 2017- NED;   # Jan 2020- progression/ hilar- START abema [jan 14th]+ faslodex[jan 13th]; OCT-NOV 2022-PET scan shows progressive disease in the bones to liver.  Discontinue Abema+ Faslodex  # DEC 1st, 2022- START Xeloda 2w/1w; MARCH 10th, 2023-progressive disease-lung bone liver  # end of March 2023- taxol weekly; Taxol held since June 1st, 2023-neuropathy     Liver biopsy- 07/05/2022- -preliminary positive for breast cancer; ER positive.  her-2 by FISH +1 [LOW]and NGS- PIC3K POSITIVE  # AUG 24th, 2023- Enhertu.   # DEC 2021- anemia/? CKD [stool ];#Ascending colon mass-incidentally noted on PET scan.  S/p  Dr. AVicente Males  colonoscpy DEC 2022- NEGATIVE; reviewed small adenomas.   # OSTEOPENIA [BMD- June 2016]  # Kidney cysts  DIAGNOSIS: LEFT BREAST CA  STAGE: IV- NED  ;GOALS: pallaitive      [place]  02/05/2014 Initial Diagnosis   Breast cancer metastasized to skin ([facility]   Malignant neoplasm of overlapping sites of left breast in female, estrogen receptor positive ([place]  07/24/2016 Initial Diagnosis   Cancer of overlapping sites of left female breast ([place]   01/06/2019 - 08/05/2020 Chemotherapy   Patient is on Treatment Plan : BREAST Abemaciclib + Fulvestrant q28d     03/22/2022 - 05/25/2022 Chemotherapy    Patient is on Treatment Plan : BREAST Paclitaxel D1,8,15 q28d     08/17/2022 - 08/17/2022 Chemotherapy   Patient is on Treatment Plan : BREAST METASTATIC fam-trastuzumab deruxtecan-nxki (Enhertu) q21d     08/17/2022 -  Chemotherapy   Patient is on Treatment Plan : BREAST METASTATIC Fam-Trastuzumab Deruxtecan-nxki (Enhertu) (5.4) q21d      INTERVAL HISTORY: Accompanied by sister; ambulating in wheelchair.  A very pleasant 81year old African-American female patient with above history of ER/PR positive breast cancer stage IV most recently on Taxol single agent [currently on hold because of poor tolerance] is here for follow-up to proceed with Enhertu.   Last chemotherapy was > 2 months ago.  Her appetite is improving.  Fatigue is improving.  Neuropathy is improving.  However continues to c/o weakness more so the last week and a half.    Review of Systems  Constitutional:  Positive for malaise/fatigue. Negative for chills, diaphoresis, fever and weight loss.  HENT:  Negative for nosebleeds and sore throat.   Eyes:  Negative for double vision.  Respiratory:  Negative for cough, hemoptysis, sputum production, shortness of breath and wheezing.   Cardiovascular:  Negative for chest pain, palpitations, orthopnea and leg swelling.  Gastrointestinal:  Negative for abdominal pain, blood in stool, constipation, diarrhea, heartburn, melena, nausea and vomiting.  Musculoskeletal:  Positive for back pain and joint pain.  Skin: Negative.  Negative for itching and rash.  Neurological:  Negative for tingling, focal weakness, weakness and headaches.  Endo/Heme/Allergies:  Does not bruise/bleed easily.  Psychiatric/Behavioral:  Negative for depression. The patient is not nervous/anxious and does not  have insomnia.    PAST MEDICAL HISTORY :  Past Medical History:  Diagnosis Date   Aneurysm ([place]) 2007   Arthritis    Cancer ([place]) 2007   diagnosed 01/07 left breast with simple mastectomu & sn bx. The pt had  a 3.5cm tumor. Frozen section report on the 2 sn were negative. On permanent sections a 0.52m micrometastasis was identified. She was not felt to require a complete axillary dissection. She has completed her Tamoxifen therapy and has been released from routine f/u with medical oncology service.   COPD (chronic obstructive pulmonary disease) (HCC)    Diabetes mellitus without complication ([place]    Hyperlipidemia    Hypertension    since age 81  Malignant ne[place] of upper-outer quadrant of [place] breast [place] 12[place]   Mastectomy site recurrence resected 01/26/2014, ER 90%, PR 0%, HER-2/neu nonamplified.   Other benign neoplasm of connective and other soft tissue of trunk, unspecified 2014   Personal history of colonic polyps    Personal history of tobacco use, presenting hazards to health    Special screening for malignant neoplasms, colon    Unspecified disorder of skin and subcutaneous tissue 02/19/2013   biopsy of skin over the sternum on the right breast showed hypertrophic scar,keloid formation.    PAST SURGICAL HISTORY :   Past Surgical History:  Procedure Laterality Date   ABDOMINAL HYSTERECTOMY  2004   total   BREAST BIOPSY Right January 26, 2014   Core biopsy for mild increase breast uptake on PET scan, usual ductal hyperplasia and stromal fibrosis   BREAST SURGERY Left 2007   mastectomy   chest wall mass  01/26/14   CHOLECYSTECTOMY  2007   COLONOSCOPY W/ POLYPECTOMY  2011   Dr. EBrynda Greathouse  COLONOSCOPY WITH PROPOFOL N/A 12/22/2021   Procedure: COLONOSCOPY WITH PROPOFOL;  Surgeon: AJonathon Bellows MD;  [facility]  Service: Gastroenterology;  Laterality: N/A;   ESOPHAGOGASTRODUODENOSCOPY (EGD) WITH PROPOFOL N/A 07/08/2022   Procedure: ESOPHAGOGASTRODUODENOSCOPY (EGD) WITH PROPOFOL;  Surgeon: LLesly Rubenstein MD;  Location: [facility] ENDOSCOPY;  Service: Endoscopy;  Laterality: N/A;   EYE SURGERY Right    cataract surgery   head surgery  1991   IR IMAGING GUIDED PORT  INSERTION  03/13/2022   MASTECTOMY Left 2007   PORT-A-CATH REMOVAL  2008   PORTACATH PLACEMENT  2007    FAMILY HISTORY :   Family History  Problem Relation Age of Onset   Cancer Other        ovarian,breast,colon cancers;relations not listed   Breast cancer Neg Hx     SOCIAL HISTORY:   Social History   Tobacco Use   Smoking status: Some Days    Packs/day: 1.00    Years: 50.00    Total pack years: 50.00    Types: Cigarettes   Smokeless tobacco: Never  Vaping Use   Vaping Use: Some days  Substance Use Topics   Alcohol use: No    Alcohol/week: 0.0 standard drinks of alcohol   Drug use: No    ALLERGIES:  is allergic to metoprolol.  MEDICATIONS:  Current Outpatient Medications  Medication Sig Dispense Refill   amLODipine (NORVASC) 10 MG tablet TAKE 1 TABLET(10 MG) BY MOUTH DAILY 90 tablet 1   atorvastatin (LIPITOR) 20 MG tablet Take 20 mg by mouth daily at 6 PM.     ergocalciferol (VITAMIN D2) 1.25 MG (50000 UT) capsule Take 1 capsule (50,000 Units total) by mouth once a week. 12 capsule 1  lidocaine-prilocaine (EMLA) cream Apply on the port. 30 -45 min  prior to port access. 30 g 3   lisinopril (ZESTRIL) 2.5 MG tablet TAKE 1 TABLET(2.5 MG) BY MOUTH DAILY 90 tablet 1   mirtazapine (REMERON) 15 MG tablet Take 1 tablet (15 mg total) by mouth at bedtime. 30 tablet 3   omeprazole (PRILOSEC OTC) 20 MG tablet Take 2 tablets (40 mg total) by mouth daily. 60 tablet 0   predniSONE (DELTASONE) 10 MG tablet Take 1 tablet (10 mg total) by mouth daily with breakfast. 30 tablet 0   meloxicam (MOBIC) 15 MG tablet TAKE 1 TABLET(15 MG) BY MOUTH DAILY (Patient not taking: Reported on 08/01/2022) 90 tablet 1   methocarbamol (ROBAXIN) 500 MG tablet Take 1 tablet (500 mg total) by mouth in the morning and at bedtime. (Patient not taking: Reported on 08/01/2022) 60 tablet 1   ondansetron (ZOFRAN) 8 MG tablet One pill every 8 hours as needed for nausea/vomitting. (Patient not taking: Reported on  08/31/2022) 40 tablet 1   prochlorperazine (COMPAZINE) 10 MG tablet TAKE 1 TABLET(10 MG) BY MOUTH EVERY 6 HOURS AS NEEDED FOR NAUSEA OR VOMITING (Patient not taking: Reported on 08/31/2022) 40 tablet 1   No current facility-administered medications for this visit.   Facility-Administered Medications Ordered in Other Visits  Medication Dose Route Frequency Provider Last Rate Last Admin   heparin lock flush 100 unit/mL  500 Units Intravenous Once Verlon Au, NP       sodium chloride flush (NS) 0.9 % injection 10 mL  10 mL Intravenous Once Verlon Au, NP        PHYSICAL EXAMINATION: ECOG PERFORMANCE STATUS: 0 - Asymptomatic  BP (!) 157/70 (Patient Position: Sitting)   Pulse 71   Temp 97.8 F (36.6 C) (Tympanic)   Resp 16   Wt 137 lb 12.8 oz (62.5 kg)   SpO2 100%   BMI 22.93 kg/m   Filed Weights   08/31/22 1455  Weight: 137 lb 12.8 oz (62.5 kg)    Physical Exam Constitutional:      Appearance: She is not ill-appearing.     Comments: Frail appearing  Eyes:     General: No scleral icterus. Cardiovascular:     Rate and Rhythm: Normal rate and regular rhythm.  Abdominal:     General: There is no distension.     Palpations: Abdomen is soft.     Tenderness: There is no abdominal tenderness. There is no guarding.  Musculoskeletal:        General: No deformity.     Right lower leg: No edema.     Left lower leg: No edema.  Lymphadenopathy:     Cervical: No cervical adenopathy.  Skin:    General: Skin is warm and dry.  Neurological:     Mental Status: She is alert and oriented to person, place, and time. Mental status is at baseline.  Psychiatric:        Mood and Affect: Mood normal.        Behavior: Behavior normal.    LABORATORY DATA:  I have reviewed the data as listed Lab Results  Component Value Date   WBC 4.8 08/31/2022   NEUTROABS 2.3 08/31/2022   HGB 9.3 (L) 08/31/2022   HCT 29.4 (L) 08/31/2022   MCV 86.2 08/31/2022   PLT 320 08/31/2022       Chemistry      Component Value Date/Time   NA 140 08/31/2022 1432   NA 139 05/30/2014 0127  K 3.3 (L) 08/31/2022 1432   K 3.7 05/30/2014 0127   CL 107 08/31/2022 1432   CL 106 05/30/2014 0127   CO2 24 08/31/2022 1432   CO2 25 05/30/2014 0127   BUN 18 08/31/2022 1432   BUN 19 (H) 05/30/2014 0127   CREATININE 1.03 (H) 08/31/2022 1432   CREATININE 0.98 05/30/2014 0127      Component Value Date/Time   CALCIUM 8.7 (L) 08/31/2022 1432   CALCIUM 9.0 05/30/2014 0127   ALKPHOS 155 (H) 08/31/2022 1432   ALKPHOS 58 05/08/2014 1131   AST 54 (H) 08/31/2022 1432   AST 20 05/08/2014 1131   ALT 30 08/31/2022 1432   ALT 18 05/08/2014 1131   BILITOT 0.4 08/31/2022 1432   BILITOT 0.4 05/08/2014 1131       RADIOGRAPHIC STUDIES: I have personally reviewed the radiological images as listed and agreed with the findings in the report. No results found.   07/07/22- CT Abdomen pelvis w contrast Performed for anemia after liver biopsy. No evidence of intestinal obstruction or pneumoperitoneum. No hydronephrosis. Diverticulosis. CAD. Sclerotic lesions of bones. No acute finding identified.    ASSESSMENT & PLAN:   Malignant neoplasm of overlapping sites of left breast in female, estrogen receptor positive ([place]) # Left breast ipsilateral chest wall recurrence stage IV [2015]-bone/liver metastases/lung nodules ; CT scan JUNE 18th, 2023- Multiple subpleural pulmonary nodules; subtly hypodense lesion of the anterior left lobe of the liver; Unchanged, widespread sclerotic osseous metastatic disease involving the axial skeleton;  Liver biopsy- 7/12- positive for breast cancer; ER positive. FISH- Her 2 LOW [+1]; NGS- PIC3K POSITIVE.  Most recently on Taxol weekly.   # Taxol discontinued d/t progression & intolerance- fall, neuropathy, fatigue. Enhertu received last week. Continued fatigue and weakness. Poor tolerance. She continues prednisone 10 mg day. Declined MRI Brain.   # declines IV Fluids today d/t  no response to fluids previously. Eating and drinking. Continue prednisone. 3 lb weight loss over past month.    # Chemotherpay induced anemia- hmg 9.3. Gradually declined over past 2 months. Prev 11. Now 9.3. Likely chemotherpay induced. Check iron studies at future visit in setting of CKD. Hold ESAs.   # Fatigue- multifactorial. Chemotherapy worsened. Continue steroids.   # CIPN- g2-3 sec to taxol. Monitor. No worse.   # Elevated blood sugars- Previously > 200. Today, 163. Monitor. If persistently elevated, consider metformin.   # Weight loss- encouraged f/u with nutrition. Continue remeron.   # Anxiety/depression- s/p celexa. Discontinued to switch to remeron (above).   # CKD- stage III, gfr 48-50. Stable. No IV fluids today.   # Hypokalemia- mild K 3.3.   # Hypocalcemia- continue Ca + Vitamin D BID. June 2023 vitamin D-OH-16. Continue ergocaliferol 50,000 unit/weekly   # Bilateral hip pain- arthritis-[s/p ortho evaluation]; on meloxicam/robaxin. STABLE.    # HTN- continue amlodipine/Lisinopril-systolic blood pressure 578I.  Monitor closely.  STABLE.    # Bone lesions: discussed zometa. Recommend ca [500]+vit D [1000 IU]. HOLD Zometa q 4 weeks; STABLE.   # Port- functioning wel. Deaccess.     ? Zometa q4 w   DISPOSITION: Thursday/pref Deaccess; no fluids Follow up with Dr. Rogue Bussing as scheduled. RTC in interim as needed.   No problem-specific Assessment & Plan notes found for this encounter.   Verlon Au, NP 08/31/2022

## 2022-09-06 MED FILL — Dexamethasone Sodium Phosphate Inj 100 MG/10ML: INTRAMUSCULAR | Qty: 1 | Status: AC

## 2022-09-07 ENCOUNTER — Inpatient Hospital Stay: Payer: Medicare HMO

## 2022-09-07 ENCOUNTER — Other Ambulatory Visit: Payer: Self-pay | Admitting: Internal Medicine

## 2022-09-07 ENCOUNTER — Inpatient Hospital Stay (HOSPITAL_BASED_OUTPATIENT_CLINIC_OR_DEPARTMENT_OTHER): Payer: Medicare HMO | Admitting: Internal Medicine

## 2022-09-07 ENCOUNTER — Encounter: Payer: Self-pay | Admitting: Internal Medicine

## 2022-09-07 DIAGNOSIS — E876 Hypokalemia: Secondary | ICD-10-CM | POA: Diagnosis not present

## 2022-09-07 DIAGNOSIS — Z170 Estrogen receptor positive status [ER+]: Secondary | ICD-10-CM | POA: Diagnosis not present

## 2022-09-07 DIAGNOSIS — C50812 Malignant neoplasm of overlapping sites of left female breast: Secondary | ICD-10-CM

## 2022-09-07 DIAGNOSIS — Z95828 Presence of other vascular implants and grafts: Secondary | ICD-10-CM

## 2022-09-07 DIAGNOSIS — N183 Chronic kidney disease, stage 3 unspecified: Secondary | ICD-10-CM | POA: Diagnosis not present

## 2022-09-07 DIAGNOSIS — C792 Secondary malignant neoplasm of skin: Secondary | ICD-10-CM | POA: Diagnosis not present

## 2022-09-07 DIAGNOSIS — C787 Secondary malignant neoplasm of liver and intrahepatic bile duct: Secondary | ICD-10-CM | POA: Diagnosis not present

## 2022-09-07 DIAGNOSIS — Z79899 Other long term (current) drug therapy: Secondary | ICD-10-CM | POA: Diagnosis not present

## 2022-09-07 DIAGNOSIS — C7951 Secondary malignant neoplasm of bone: Secondary | ICD-10-CM | POA: Diagnosis not present

## 2022-09-07 LAB — CBC WITH DIFFERENTIAL/PLATELET
Abs Immature Granulocytes: 0.33 10*3/uL — ABNORMAL HIGH (ref 0.00–0.07)
Basophils Absolute: 0 10*3/uL (ref 0.0–0.1)
Basophils Relative: 1 %
Eosinophils Absolute: 0.2 10*3/uL (ref 0.0–0.5)
Eosinophils Relative: 3 %
HCT: 30.2 % — ABNORMAL LOW (ref 36.0–46.0)
Hemoglobin: 9.4 g/dL — ABNORMAL LOW (ref 12.0–15.0)
Immature Granulocytes: 5 %
Lymphocytes Relative: 37 %
Lymphs Abs: 2.5 10*3/uL (ref 0.7–4.0)
MCH: 27.4 pg (ref 26.0–34.0)
MCHC: 31.1 g/dL (ref 30.0–36.0)
MCV: 88 fL (ref 80.0–100.0)
Monocytes Absolute: 0.7 10*3/uL (ref 0.1–1.0)
Monocytes Relative: 11 %
Neutro Abs: 2.9 10*3/uL (ref 1.7–7.7)
Neutrophils Relative %: 43 %
Platelets: 246 10*3/uL (ref 150–400)
RBC: 3.43 MIL/uL — ABNORMAL LOW (ref 3.87–5.11)
RDW: 18.5 % — ABNORMAL HIGH (ref 11.5–15.5)
WBC: 6.6 10*3/uL (ref 4.0–10.5)
nRBC: 3.8 % — ABNORMAL HIGH (ref 0.0–0.2)

## 2022-09-07 LAB — COMPREHENSIVE METABOLIC PANEL
ALT: 22 U/L (ref 0–44)
AST: 52 U/L — ABNORMAL HIGH (ref 15–41)
Albumin: 3 g/dL — ABNORMAL LOW (ref 3.5–5.0)
Alkaline Phosphatase: 195 U/L — ABNORMAL HIGH (ref 38–126)
Anion gap: 6 (ref 5–15)
BUN: 19 mg/dL (ref 8–23)
CO2: 25 mmol/L (ref 22–32)
Calcium: 8.9 mg/dL (ref 8.9–10.3)
Chloride: 109 mmol/L (ref 98–111)
Creatinine, Ser: 0.94 mg/dL (ref 0.44–1.00)
GFR, Estimated: >60 mL/min (ref >60)
Glucose, Bld: 163 mg/dL — ABNORMAL HIGH (ref 70–99)
Potassium: 3.3 mmol/L — ABNORMAL LOW (ref 3.5–5.1)
Sodium: 140 mmol/L (ref 135–145)
Total Bilirubin: 0.6 mg/dL (ref 0.3–1.2)
Total Protein: 6.6 g/dL (ref 6.5–8.1)

## 2022-09-07 MED ORDER — POTASSIUM CHLORIDE CRYS ER 20 MEQ PO TBCR: EXTENDED_RELEASE_TABLET | ORAL | 1 refills | Status: DC

## 2022-09-07 MED ORDER — HEPARIN SOD (PORK) LOCK FLUSH 100 UNIT/ML IV SOLN
500.0000 [IU] | Freq: Once | INTRAVENOUS | Status: AC
  Administered 2022-09-07: 500 [IU] via INTRAVENOUS
  Filled 2022-09-07: qty 5

## 2022-09-07 MED ORDER — SODIUM CHLORIDE 0.9% FLUSH
10.0000 mL | Freq: Once | INTRAVENOUS | Status: AC
  Administered 2022-09-07: 10 mL via INTRAVENOUS
  Filled 2022-09-07: qty 10

## 2022-09-07 NOTE — Progress Notes (Unsigned)
No concerns. 

## 2022-09-07 NOTE — Progress Notes (Signed)
Nutrition Follow-up:  Patient with stage IV breast cancer.  Patient receiving enhertu but currently on hold.  Treatment held today so RD was not able to see in infusion.    RD called patient for follow-up.  Patient reports that appetite maybe a little bit better but not much.  Mostly drinks 2 boost high protein per day and nibbles.  Likes peanut butter crackers.  Had chicken leg a few minutes ago.  Eats yogurt sometimes, loves cheese.    Medications: remeron  Labs: reviewed  Anthropometrics:   Weight 137 lb 4.8 oz, stable  137 lb 11.2 oz on 8/24 146 lb on 7/12 151 lb 11.2 oz on 3/29   NUTRITION DIAGNOSIS: Inadequate oral intake continues   INTERVENTION:  Reviewed importance of adding protein foods in diet.  Discussed foods that contain protein Encouraged oral nutrition supplements TID if able    MONITORING, EVALUATION, GOAL: weight trends, intake   NEXT VISIT: as needed  Jessica Compton, [place], [place] Registered Dietitian [phone]

## 2022-09-07 NOTE — Progress Notes (Signed)
HOLD Enhertu per MD

## 2022-09-07 NOTE — Assessment & Plan Note (Addendum)
#  Left breast ipsilateral chest wall recurrence stage IV [2015]-bone/liver metastases/lung nodules ; CT scan JUNE 18th, 2023- Multiple subpleural pulmonary nodules; subtly hypodense lesion of the anterior left lobe of the liver; Unchanged, widespread sclerotic osseous metastatic disease involving the axial skeleton;  Liver biopsy- 7/12- positive for breast cancer; ER positive. FISH- Her 2 LOW [+1]; NGS- PIC3K POSITIVE.  Currently on Enhertu-however see below  #Extreme poor tolerance to Enhertu; patient reluctant with any further therapies at this time.  Hold chemotherapy for now/chemo therapy break.  We will consider Piqra+Fulvestrant at next visit if performance status improves. JUNE 9th, 2023- 2D echo-EF  55-60%.  #Fatigue multifactorial-chemotherapy.  Continue prednisone 10 mg a day.  . Pt declined MRI Brain.   # PN-2-3 sec to Taxol- monitor for now; discontinued Taxol chemotherapy.  Consider Neurontin.  STABLE.   #Elevated blood sugars [?  Prediabetes]-monitor closely on steroids/dexamethasone premedication. 163 monitor for now.     # Hypocalcemia-continue calcium plus vitamin D twice daily; vitamin D 25-OH- 16 [June 2023]; currently on Ergocaliferol 50,000 unit/weekly.  STABLE.   # Hypokalemia-3.3- recommend Kdur BID.   # Bilateral hip pain- arthritis-[s/p ortho evaluation]; on meloxicam/robaxin- refilled.   STABLE.   # HTN- continue amlodipine/Lisinopril-systolic blood pressure 165V.  Monitor closely.  STABLE.   # Bone lesions: discussed zometa. Recommend ca [500]+vit D [1000 IU]. HOLD Zometa q 4 weeks; STABLE.   # CKD- stage III- GFR 48-50- STABLE; mild hypokalemia potassium 3.3-. STABLE.   # Anxiety/depression- Celexa; DC Celexa.  But switch to remeron-for apetite.   # Prognosis: Discussed with the patient/sister regarding her treatment options are limited given the poor tolerance.  Agree with holding treatment at this time.  We will plan to repeat imaging in 6 weeks or so and  consider further therapies- [place] +faslodex.  Also discussed option of hospice-continues to decline.  ? Zometa q4 w;   # DISPOSITION: Thursday/pref # HOLD Enhertu; De-Access # follow up in 6 weeks- MD; labs- cbc/cmp; cea; ca27-29; NO treatment; CT CAP prior-- - Dr.B

## 2022-09-07 NOTE — Progress Notes (Signed)
Jessica Compton OFFICE PROGRESS NOTE  Patient Care Team: Pcp, No as PCP - General Byrnett, Forest Gleason, MD (General Surgery) Marden Noble, MD (Internal Medicine) [name], MD as Consulting Physician (Internal Medicine)   SUMMARY OF ONCOLOGIC HISTORY:  Oncology History Overview Note  # 2007- LEFT BREAST CA STAGE II [T2N18m; s/p mastec; Dr.Byrnett] ER-Pos/PR Neg; Her- 2 Neu- NEG; ACx4-Taxol x12; Femara; stopped May 2009-stopped follow up.  #JAN 2015- Post mastectomy Chest wall recurrence [chest wall mass bx-]; ER- 90%; PR- NEG; Her2 Neu- NEG; s/p excision [involving skeletal muscle/adipose tissue; clear margins]; CT July 2017- NED;   # Jan 2020- progression/ hilar- START abema [jan 14th]+ faslodex[jan 13th]; OCT-NOV 2022-PET scan shows progressive disease in the bones to liver.  Discontinue Abema+ Faslodex  # DEC 1st, 2022- START Xeloda 2w/1w; MARCH 10th, 2023-progressive disease-lung bone liver  # end of March 2023- taxol weekly; Taxol held since June 1st, 2023-neuropathy     Liver biopsy- 07/05/2022- -preliminary positive for breast cancer; ER positive.  her-2 by FISH +1 [LOW]and NGS- PIC3K POSITIVE  # AUG 24th, 2023- Enhertu.   # DEC 2021- anemia/? CKD [stool ];#Ascending colon mass-incidentally noted on PET scan.  S/p  Dr. AVicente Males  colonoscpy DEC 2022- NEGATIVE; reviewed small adenomas.   # OSTEOPENIA [BMD- June 2016]  # Kidney cysts  DIAGNOSIS: LEFT BREAST CA  STAGE: IV- NED  ;GOALS: pallaitive      Breast cancer metastasized to skin ([facility]  02/05/2014 Initial Diagnosis   Breast cancer metastasized to skin ([facility]   Malignant neoplasm of overlapping sites of left breast in female, estrogen receptor positive ([facility]  07/24/2016 Initial Diagnosis   Cancer of overlapping sites of left female breast ([facility]   01/06/2019 - 08/05/2020 Chemotherapy   Patient is on Treatment Plan : BREAST Abemaciclib + Fulvestrant q28d     03/22/2022 - 05/25/2022 Chemotherapy    Patient is on Treatment Plan : BREAST Paclitaxel D1,8,15 q28d     08/17/2022 - 08/17/2022 Chemotherapy   Patient is on Treatment Plan : BREAST METASTATIC fam-trastuzumab deruxtecan-nxki (Enhertu) q21d     08/17/2022 -  Chemotherapy   Patient is on Treatment Plan : BREAST METASTATIC Fam-Trastuzumab Deruxtecan-nxki (Enhertu) (5.4) q21d      INTERVAL HISTORY: Accompanied by sister; ambulating in wheelchair.  A very pleasant 81year old African-American female patient with above history of ER/PR positive breast cancer stage IV most recently on Taxol single agent [currently on hold because of poor tolerance] is here for follow-up to proceed with Enhertu.   Patient s/p chemotherapy 3 weeks ago noted to have a significant decline.  Worsening fatigue.  Poor appetite.  Complains of weight loss.  Significant decline in performance status.  We will continue numbness and activities.  Generalized weakness.   Review of Systems  Constitutional:  Positive for malaise/fatigue. Negative for chills, diaphoresis, fever and weight loss.  HENT:  Negative for nosebleeds and sore throat.   Eyes:  Negative for double vision.  Respiratory:  Negative for cough, hemoptysis, sputum production, shortness of breath and wheezing.   Cardiovascular:  Negative for chest pain, palpitations, orthopnea and leg swelling.  Gastrointestinal:  Negative for abdominal pain, blood in stool, constipation, diarrhea, heartburn, melena, nausea and vomiting.  Musculoskeletal:  Positive for back pain and joint pain.  Skin: Negative.  Negative for itching and rash.  Neurological:  Negative for tingling, focal weakness, weakness and headaches.  Endo/Heme/Allergies:  Does not bruise/bleed easily.  Psychiatric/Behavioral:  Negative for depression. The patient is  not nervous/anxious and does not have insomnia.    PAST MEDICAL HISTORY :  Past Medical History:  Diagnosis Date   Aneurysm ([place]) 2007   Arthritis    Cancer ([place]) 2007   diagnosed  01/07 left breast with simple mastectomu & sn bx. The pt had a 3.5cm tumor. Frozen section report on the 2 sn were negative. On permanent sections a 0.79m micrometastasis was identified. She was not felt to require a complete axillary dissection. She has completed her Tamoxifen therapy and has been released from routine f/u with medical oncology service.   COPD (chronic obstructive pulmonary disease) (HCC)    Diabetes mellitus without complication ([facility]    Hyperlipidemia    Hypertension    since age 81  Malignant neoplasm of upper-outer quadrant of female breast ([facility] 12/2013   Mastectomy site recurrence resected 01/26/2014, ER 90%, PR 0%, HER-2/neu nonamplified.   Other benign neoplasm of connective and other soft tissue of trunk, unspecified 2014   Personal history of colonic polyps    Personal history of tobacco use, presenting hazards to health    Special screening for malignant neoplasms, colon    Unspecified disorder of skin and subcutaneous tissue 02/19/2013   biopsy of skin over the sternum on the right breast showed hypertrophic scar,keloid formation.    PAST SURGICAL HISTORY :   Past Surgical History:  Procedure Laterality Date   ABDOMINAL HYSTERECTOMY  2004   total   BREAST BIOPSY Right January 26, 2014   Core biopsy for mild increase breast uptake on PET scan, usual ductal hyperplasia and stromal fibrosis   BREAST SURGERY Left 2007   mastectomy   chest wall mass  01/26/14   CHOLECYSTECTOMY  2007   COLONOSCOPY W/ POLYPECTOMY  2011   Dr. EBrynda Greathouse  COLONOSCOPY WITH PROPOFOL N/A 12/22/2021   Procedure: COLONOSCOPY WITH PROPOFOL;  Surgeon: AJonathon Bellows MD;  [facility];  Service: Gastroenterology;  Laterality: N/A;   ESOPHAGOGASTRODUODENOSCOPY (EGD) WITH PROPOFOL N/A 07/08/2022   Procedure: ESOPHAGOGASTRODUODENOSCOPY (EGD) WITH PROPOFOL;  Surgeon: LLesly Rubenstein MD;  Location: [facility] ENDOSCOPY;  Service: Endoscopy;  Laterality: N/A;   EYE SURGERY Right     cataract surgery   head surgery  1991   IR IMAGING GUIDED PORT INSERTION  03/13/2022   MASTECTOMY Left 2007   PORT-A-CATH REMOVAL  2008   PORTACATH PLACEMENT  2007    FAMILY HISTORY :   Family History  Problem Relation Age of Onset   Cancer Other        ovarian,breast,colon cancers;relations not listed   Breast cancer Neg Hx     SOCIAL HISTORY:   Social History   Tobacco Use   Smoking status: Some Days    Packs/day: 1.00    Years: 50.00    Total pack years: 50.00    Types: Cigarettes   Smokeless tobacco: Never  Vaping Use   Vaping Use: Some days  Substance Use Topics   Alcohol use: No    Alcohol/week: 0.0 standard drinks of alcohol   Drug use: No    ALLERGIES:  is allergic to metoprolol.  MEDICATIONS:  Current Outpatient Medications  Medication Sig Dispense Refill   amLODipine (NORVASC) 10 MG tablet TAKE 1 TABLET(10 MG) BY MOUTH DAILY 90 tablet 1   atorvastatin (LIPITOR) 20 MG tablet Take 20 mg by mouth daily at 6 PM.     ergocalciferol (VITAMIN D2) 1.25 MG (50000 UT) capsule Take 1 capsule (50,000 Units total) by mouth once a week.  12 capsule 1   lidocaine-prilocaine (EMLA) cream Apply on the port. 30 -45 min  prior to port access. 30 g 3   lisinopril (ZESTRIL) 2.5 MG tablet TAKE 1 TABLET(2.5 MG) BY MOUTH DAILY 90 tablet 1   mirtazapine (REMERON) 15 MG tablet Take 1 tablet (15 mg total) by mouth at bedtime. 30 tablet 3   potassium chloride SA (KLOR-CON M) 20 MEQ tablet 1 pill twice a day 60 tablet 1   predniSONE (DELTASONE) 10 MG tablet Take 1 tablet (10 mg total) by mouth daily with breakfast. 30 tablet 0   meloxicam (MOBIC) 15 MG tablet TAKE 1 TABLET(15 MG) BY MOUTH DAILY (Patient not taking: Reported on 08/01/2022) 90 tablet 1   methocarbamol (ROBAXIN) 500 MG tablet Take 1 tablet (500 mg total) by mouth in the morning and at bedtime. (Patient not taking: Reported on 08/01/2022) 60 tablet 1   omeprazole (PRILOSEC OTC) 20 MG tablet Take 2 tablets (40 mg total) by mouth  daily. 60 tablet 0   ondansetron (ZOFRAN) 8 MG tablet One pill every 8 hours as needed for nausea/vomitting. (Patient not taking: Reported on 08/31/2022) 40 tablet 1   prochlorperazine (COMPAZINE) 10 MG tablet TAKE 1 TABLET(10 MG) BY MOUTH EVERY 6 HOURS AS NEEDED FOR NAUSEA OR VOMITING (Patient not taking: Reported on 08/31/2022) 40 tablet 1   No current facility-administered medications for this visit.    PHYSICAL EXAMINATION: ECOG PERFORMANCE STATUS: 0 - Asymptomatic  BP (!) 174/75 (BP Location: Right Arm, Patient Position: Sitting, Cuff Size: Normal)   Pulse 79   Temp 97.6 F (36.4 C) (Tympanic)   Ht 5' 5"  (1.651 m)   Wt 137 lb 4.8 oz (62.3 kg)   SpO2 100%   BMI 22.85 kg/m   Filed Weights   09/07/22 0847  Weight: 137 lb 4.8 oz (62.3 kg)        Physical Exam HENT:     Head: Normocephalic and atraumatic.     Mouth/Throat:     Pharynx: No oropharyngeal exudate.  Eyes:     Pupils: Pupils are equal, round, and reactive to light.  Cardiovascular:     Rate and Rhythm: Normal rate and regular rhythm.  Pulmonary:     Effort: No respiratory distress.     Breath sounds: No wheezing.  Abdominal:     General: Bowel sounds are normal. There is no distension.     Palpations: Abdomen is soft. There is no mass.     Tenderness: There is no abdominal tenderness. There is no guarding or rebound.  Musculoskeletal:        General: No tenderness. Normal range of motion.     Cervical back: Normal range of motion and neck supple.  Skin:    General: Skin is warm.  Neurological:     Mental Status: She is alert and oriented to person, place, and time.  Psychiatric:        Mood and Affect: Affect normal.    LABORATORY DATA:  I have reviewed the data as listed    Component Value Date/Time   NA 140 09/07/2022 0839   NA 139 05/30/2014 0127   K 3.3 (L) 09/07/2022 0839   K 3.7 05/30/2014 0127   CL 109 09/07/2022 0839   CL 106 05/30/2014 0127   CO2 25 09/07/2022 0839   CO2 25  05/30/2014 0127   GLUCOSE 163 (H) 09/07/2022 0839   GLUCOSE 136 (H) 05/30/2014 0127   BUN 19 09/07/2022 0839   BUN 19 (  H) 05/30/2014 0127   CREATININE 0.94 09/07/2022 0839   CREATININE 0.98 05/30/2014 0127   CALCIUM 8.9 09/07/2022 0839   CALCIUM 9.0 05/30/2014 0127   PROT 6.6 09/07/2022 0839   PROT 7.8 05/08/2014 1131   ALBUMIN 3.0 (L) 09/07/2022 0839   ALBUMIN 3.7 05/08/2014 1131   AST 52 (H) 09/07/2022 0839   AST 20 05/08/2014 1131   ALT 22 09/07/2022 0839   ALT 18 05/08/2014 1131   ALKPHOS 195 (H) 09/07/2022 0839   ALKPHOS 58 05/08/2014 1131   BILITOT 0.6 09/07/2022 0839   BILITOT 0.4 05/08/2014 1131   GFRNONAA >60 09/07/2022 0839   GFRNONAA 58 (L) 05/30/2014 0127   GFRAA 47 (L) 09/02/2020 0920   GFRAA >60 05/30/2014 0127    No results found for: "SPEP", "UPEP"  Lab Results  Component Value Date   WBC 6.6 09/07/2022   NEUTROABS 2.9 09/07/2022   HGB 9.4 (L) 09/07/2022   HCT 30.2 (L) 09/07/2022   MCV 88.0 09/07/2022   PLT 246 09/07/2022      Chemistry      Component Value Date/Time   NA 140 09/07/2022 0839   NA 139 05/30/2014 0127   K 3.3 (L) 09/07/2022 0839   K 3.7 05/30/2014 0127   CL 109 09/07/2022 0839   CL 106 05/30/2014 0127   CO2 25 09/07/2022 0839   CO2 25 05/30/2014 0127   BUN 19 09/07/2022 0839   BUN 19 (H) 05/30/2014 0127   CREATININE 0.94 09/07/2022 0839   CREATININE 0.98 05/30/2014 0127      Component Value Date/Time   CALCIUM 8.9 09/07/2022 0839   CALCIUM 9.0 05/30/2014 0127   ALKPHOS 195 (H) 09/07/2022 0839   ALKPHOS 58 05/08/2014 1131   AST 52 (H) 09/07/2022 0839   AST 20 05/08/2014 1131   ALT 22 09/07/2022 0839   ALT 18 05/08/2014 1131   BILITOT 0.6 09/07/2022 0839   BILITOT 0.4 05/08/2014 1131         RADIOGRAPHIC STUDIES: I have personally reviewed the radiological images as listed and agreed with the findings in the report. No results found.   ASSESSMENT & PLAN:   Malignant neoplasm of overlapping sites of left breast  in female, estrogen receptor positive ([place]) #Left breast ipsilateral chest wall recurrence stage IV [2015]-bone/liver metastases/lung nodules ; CT scan JUNE 18th, 2023- Multiple subpleural pulmonary nodules; subtly hypodense lesion of the anterior left lobe of the liver; Unchanged, widespread sclerotic osseous metastatic disease involving the axial skeleton;  Liver biopsy- 7/12- positive for breast cancer; ER positive. FISH- Her 2 LOW [+1]; NGS- PIC3K POSITIVE.  Currently on Enhertu-however see below  #Extreme poor tolerance to Enhertu; patient reluctant with any further therapies at this time.  Hold chemotherapy for now/chemo therapy break.  We will consider Piqra+Fulvestrant at next visit if performance status improves. JUNE 9th, 2023- 2D echo-EF  55-60%.  #Fatigue multifactorial-chemotherapy.  Continue prednisone 10 mg a day.  . Pt declined MRI Brain.   # PN-2-3 sec to Taxol- monitor for now; discontinued Taxol chemotherapy.  Consider Neurontin.  STABLE.   #Elevated blood sugars [?  Prediabetes]-monitor closely on steroids/dexamethasone premedication. 163 monitor for now.     # Hypocalcemia-continue calcium plus vitamin D twice daily; vitamin D 25-OH- 16 [June 2023]; currently on Ergocaliferol 50,000 unit/weekly.  STABLE.   # Hypokalemia-3.3- recommend Kdur BID.   # Bilateral hip pain- arthritis-[s/p ortho evaluation]; on meloxicam/robaxin- refilled.   STABLE.   # HTN- continue amlodipine/Lisinopril-systolic blood pressure 093J.  Monitor closely.  STABLE.   # Bone lesions: discussed zometa. Recommend ca [500]+vit D [1000 IU]. HOLD Zometa q 4 weeks; STABLE.   # CKD- stage III- GFR 48-50- STABLE; mild hypokalemia potassium 3.3-. STABLE.   # Anxiety/depression- Celexa; DC Celexa.  But switch to remeron-for apetite.   # Prognosis: Discussed with the patient/sister regarding her treatment options are limited given the poor tolerance.  Agree with holding treatment at this time.  We will plan  to repeat imaging in 6 weeks or so and consider further therapies- [place] +faslodex.  Also discussed option of hospice-continues to decline.  ? Zometa q4 w;   # DISPOSITION: Thursday/pref # HOLD Enhertu; De-Access # follow up in 6 weeks- MD; labs- cbc/cmp; cea; ca27-29; NO treatment; CT CAP prior-- - Dr.B           [name], MD 09/08/2022 6:16 AM

## 2022-09-08 ENCOUNTER — Encounter: Payer: Self-pay | Admitting: Internal Medicine

## 2022-09-08 ENCOUNTER — Other Ambulatory Visit: Payer: Self-pay

## 2022-09-08 LAB — CANCER ANTIGEN 27.29: CA 27.29: 378.1 U/mL — ABNORMAL HIGH (ref 0.0–38.6)

## 2022-09-09 LAB — CEA: CEA: 20.7 ng/mL — ABNORMAL HIGH (ref 0.0–4.7)

## 2022-09-10 ENCOUNTER — Other Ambulatory Visit: Payer: Self-pay

## 2022-09-11 ENCOUNTER — Telehealth: Payer: Self-pay | Admitting: *Deleted

## 2022-09-11 NOTE — Patient Outreach (Signed)
  Care Coordination   09/11/2022 Name: Jessica Compton MRN: [id number] DOB: 24-Apr-1941   Care Coordination Outreach Attempts:  An unsuccessful telephone outreach was attempted today to offer the patient information about available care coordination services as a benefit of their health plan.   Follow Up Plan:  Additional outreach attempts will be made to offer the patient care coordination information and services.   Encounter Outcome:  No Answer  Care Coordination Interventions Activated:  No   Care Coordination Interventions:  No, not indicated    SIG [name] C. Myrtie Neither, MSN, [facility] Gerontological Nurse Practitioner [facility] Care Management [phone]

## 2022-09-19 ENCOUNTER — Telehealth: Payer: Self-pay | Admitting: Internal Medicine

## 2022-09-19 NOTE — Telephone Encounter (Signed)
I spoke to patient's daughter Jessica Compton regarding recent follow-up at the cancer center.  Recommend holding treatment given patient's poor tolerance to therapy.  Recommend follow-up as planned-discussed would consider other options at next visit depending upon patient's clinical status.  GB

## 2022-09-22 ENCOUNTER — Encounter: Payer: Self-pay | Admitting: Internal Medicine

## 2022-09-26 ENCOUNTER — Inpatient Hospital Stay: Payer: Medicare HMO | Attending: Internal Medicine

## 2022-09-26 ENCOUNTER — Other Ambulatory Visit: Payer: Self-pay

## 2022-09-26 ENCOUNTER — Telehealth: Payer: Self-pay | Admitting: *Deleted

## 2022-09-26 ENCOUNTER — Inpatient Hospital Stay: Payer: Medicare HMO

## 2022-09-26 ENCOUNTER — Telehealth: Payer: Self-pay | Admitting: Internal Medicine

## 2022-09-26 ENCOUNTER — Inpatient Hospital Stay (HOSPITAL_BASED_OUTPATIENT_CLINIC_OR_DEPARTMENT_OTHER): Payer: Medicare HMO | Admitting: Hospice and Palliative Medicine

## 2022-09-26 VITALS — BP 151/71 | HR 103 | Temp 97.1°F | Resp 20 | Wt 132.1 lb

## 2022-09-26 DIAGNOSIS — E860 Dehydration: Secondary | ICD-10-CM

## 2022-09-26 DIAGNOSIS — Z170 Estrogen receptor positive status [ER+]: Secondary | ICD-10-CM

## 2022-09-26 DIAGNOSIS — C50812 Malignant neoplasm of overlapping sites of left female breast: Secondary | ICD-10-CM

## 2022-09-26 DIAGNOSIS — R531 Weakness: Secondary | ICD-10-CM

## 2022-09-26 DIAGNOSIS — R530 Neoplastic (malignant) related fatigue: Secondary | ICD-10-CM | POA: Diagnosis not present

## 2022-09-26 DIAGNOSIS — C7951 Secondary malignant neoplasm of bone: Secondary | ICD-10-CM | POA: Insufficient documentation

## 2022-09-26 DIAGNOSIS — R5383 Other fatigue: Secondary | ICD-10-CM

## 2022-09-26 DIAGNOSIS — R630 Anorexia: Secondary | ICD-10-CM | POA: Insufficient documentation

## 2022-09-26 DIAGNOSIS — Z95828 Presence of other vascular implants and grafts: Secondary | ICD-10-CM

## 2022-09-26 DIAGNOSIS — C787 Secondary malignant neoplasm of liver and intrahepatic bile duct: Secondary | ICD-10-CM | POA: Insufficient documentation

## 2022-09-26 DIAGNOSIS — C7800 Secondary malignant neoplasm of unspecified lung: Secondary | ICD-10-CM | POA: Diagnosis not present

## 2022-09-26 LAB — URINALYSIS, COMPLETE (UACMP) WITH MICROSCOPIC
Bilirubin Urine: NEGATIVE
Glucose, UA: NEGATIVE mg/dL
Hgb urine dipstick: NEGATIVE
Ketones, ur: 20 mg/dL — AB
Nitrite: NEGATIVE
Protein, ur: NEGATIVE mg/dL
Specific Gravity, Urine: 1.021 (ref 1.005–1.030)
pH: 5 (ref 5.0–8.0)

## 2022-09-26 LAB — COMPREHENSIVE METABOLIC PANEL
ALT: 18 U/L (ref 0–44)
AST: 51 U/L — ABNORMAL HIGH (ref 15–41)
Albumin: 3.1 g/dL — ABNORMAL LOW (ref 3.5–5.0)
Alkaline Phosphatase: 164 U/L — ABNORMAL HIGH (ref 38–126)
Anion gap: 11 (ref 5–15)
BUN: 23 mg/dL (ref 8–23)
CO2: 20 mmol/L — ABNORMAL LOW (ref 22–32)
Calcium: 8.8 mg/dL — ABNORMAL LOW (ref 8.9–10.3)
Chloride: 105 mmol/L (ref 98–111)
Creatinine, Ser: 1.2 mg/dL — ABNORMAL HIGH (ref 0.44–1.00)
GFR, Estimated: 46 mL/min — ABNORMAL LOW (ref >60)
Glucose, Bld: 192 mg/dL — ABNORMAL HIGH (ref 70–99)
Potassium: 3.8 mmol/L (ref 3.5–5.1)
Sodium: 136 mmol/L (ref 135–145)
Total Bilirubin: 1.3 mg/dL — ABNORMAL HIGH (ref 0.3–1.2)
Total Protein: 7.5 g/dL (ref 6.5–8.1)

## 2022-09-26 LAB — CBC WITH DIFFERENTIAL/PLATELET
Abs Immature Granulocytes: 0.08 10*3/uL — ABNORMAL HIGH (ref 0.00–0.07)
Basophils Absolute: 0 10*3/uL (ref 0.0–0.1)
Basophils Relative: 0 %
Eosinophils Absolute: 0.1 10*3/uL (ref 0.0–0.5)
Eosinophils Relative: 1 %
HCT: 31.1 % — ABNORMAL LOW (ref 36.0–46.0)
Hemoglobin: 9.8 g/dL — ABNORMAL LOW (ref 12.0–15.0)
Immature Granulocytes: 1 %
Lymphocytes Relative: 37 %
Lymphs Abs: 3.2 10*3/uL (ref 0.7–4.0)
MCH: 26.9 pg (ref 26.0–34.0)
MCHC: 31.5 g/dL (ref 30.0–36.0)
MCV: 85.4 fL (ref 80.0–100.0)
Monocytes Absolute: 1 10*3/uL (ref 0.1–1.0)
Monocytes Relative: 11 %
Neutro Abs: 4.4 10*3/uL (ref 1.7–7.7)
Neutrophils Relative %: 50 %
Platelets: 258 10*3/uL (ref 150–400)
RBC: 3.64 MIL/uL — ABNORMAL LOW (ref 3.87–5.11)
RDW: 20.5 % — ABNORMAL HIGH (ref 11.5–15.5)
WBC: 8.7 10*3/uL (ref 4.0–10.5)
nRBC: 0.2 % (ref 0.0–0.2)

## 2022-09-26 LAB — TSH: TSH: 2.343 u[IU]/mL (ref 0.350–4.500)

## 2022-09-26 LAB — MAGNESIUM: Magnesium: 2 mg/dL (ref 1.7–2.4)

## 2022-09-26 MED ORDER — MIRTAZAPINE 15 MG PO TABS: 7.5000 mg | ORAL_TABLET | Freq: Every day | ORAL | 3 refills | Status: AC

## 2022-09-26 MED ORDER — SODIUM CHLORIDE 0.9 % IV SOLN
INTRAVENOUS | Status: DC
  Filled 2022-09-26 (×2): qty 250

## 2022-09-26 MED ORDER — DEXAMETHASONE 2 MG PO TABS: 2.0000 mg | ORAL_TABLET | Freq: Every day | ORAL | 1 refills | Status: DC

## 2022-09-26 MED ORDER — HEPARIN SOD (PORK) LOCK FLUSH 100 UNIT/ML IV SOLN
500.0000 [IU] | Freq: Once | INTRAVENOUS | Status: AC
  Administered 2022-09-26: 500 [IU]
  Filled 2022-09-26: qty 5

## 2022-09-26 MED ORDER — SODIUM CHLORIDE 0.9% FLUSH
10.0000 mL | Freq: Once | INTRAVENOUS | Status: AC
  Administered 2022-09-26: 10 mL via INTRAVENOUS
  Filled 2022-09-26: qty 10

## 2022-09-26 NOTE — Telephone Encounter (Signed)
Patient's daughter called stating that she would like to have patient seen today. She is lethargic, extremely fatigued and she is concerned. Please advise.

## 2022-09-26 NOTE — Telephone Encounter (Signed)
Spoke with daughter, Helene Kelp. Arranged for [facility] apt at 11 am. Labs ordered- cbc/metc/ mag/ u/a and urine culture. Pt reports fatigue/weakness. Daughter states pt is lethargic at times. No fever, nausea, vomiting, diarrhea. Pt feels bloated per daughter.

## 2022-09-26 NOTE — Progress Notes (Signed)
Symptom Management [place] at [facility] [phone] [phone]  Patient Care Team: Pcp, No as PCP - General Byrnett, Forest Gleason, MD (General Surgery) Marden Noble, MD (Internal Medicine) [name], MD as Consulting Physician (Internal Medicine)   NAME OF PATIENT: Jessica Compton  [id number]  09-06-1941   DATE OF VISIT: 09/26/22  REASON FOR CONSULT: Jessica Compton is a 81 y.o. female with multiple medical problems including stage IV ER/PR positive left breast cancer with metastasis to bone and liver and lung.  Patient was on treatment with Taxol but this was discontinued due to progression and overall poor tolerance with weakness and decline.  She was rotated to Enhertu.   INTERVAL HISTORY: Patient saw Dr. Rogue Compton on 09/07/2022.  Patient was found to have overall decline in performance status and poor tolerance of Enhertu and so treatments were subsequently discontinued.  Patient presents [facility] today with continued weakness/fatigue.  Daughter reports that fatigue has seemed worse over the past several days.  Patient does endorse some slight nasal congestion over the same time period.  Oral intake is declined slightly.  Daughter reports that patient is sleeping often during the day.  No shortness of breath or chest pain.  No urinary symptoms.  No fever or chills.  No known exposures to sick contacts.  Denies any neurologic complaints. Denies recent fevers or illnesses. Denies any easy bleeding or bruising.  Denies urinary complaints. Patient offers no further specific complaints today.   PAST MEDICAL HISTORY: Past Medical History:  Diagnosis Date   Aneurysm ([place]) 2007   Arthritis    Cancer ([place]) 2007   diagnosed 01/07 left breast with simple mastectomu & sn bx. The pt had a 3.5cm tumor. Frozen section report on the 2 sn were negative. On permanent sections a 0.79m micrometastasis was identified. She was not felt to  require a complete axillary dissection. She has completed her Tamoxifen therapy and has been released from routine f/u with medical oncology service.   COPD (chronic obstructive pulmonary disease) (HCC)    Diabetes mellitus without complication ([place]    Hyperlipidemia    Hypertension    since age [age]  Malignant neoplasm of upper-outer quadrant of female breast ([facility] 12/2013   Mastectomy site recurrence resected 01/26/2014, ER 90%, PR 0%, HER-2/neu nonamplified.   Other benign neoplasm of connective and other soft tissue of trunk, unspecified 2014   Personal history of colonic polyps    Personal history of tobacco use, presenting hazards to health    Special screening for malignant neoplasms, colon    Unspecified disorder of skin and subcutaneous tissue 02/19/2013   biopsy of skin over the sternum on the right breast showed hypertrophic scar,keloid formation.    PAST SURGICAL HISTORY:  Past Surgical History:  Procedure Laterality Date   ABDOMINAL HYSTERECTOMY  2004   total   BREAST BIOPSY Right January 26, 2014   Core biopsy for mild increase breast uptake on PET scan, usual ductal hyperplasia and stromal fibrosis   BREAST SURGERY Left 2007   mastectomy   chest wall mass  01/26/14   CHOLECYSTECTOMY  2007   COLONOSCOPY W/ POLYPECTOMY  2011   Dr. EBrynda Greathouse  COLONOSCOPY WITH PROPOFOL N/A 12/22/2021   Procedure: COLONOSCOPY WITH PROPOFOL;  Surgeon: AJonathon Bellows MD;  [facility];  Service: Gastroenterology;  Laterality: N/A;   ESOPHAGOGASTRODUODENOSCOPY (EGD) WITH PROPOFOL N/A 07/08/2022   Procedure: ESOPHAGOGASTRODUODENOSCOPY (EGD) WITH PROPOFOL;  Surgeon: LLesly Rubenstein MD;  Location: [facility] ENDOSCOPY;  Service: Endoscopy;  Laterality: N/A;   EYE SURGERY Right    cataract surgery   head surgery  1991   IR IMAGING GUIDED PORT INSERTION  03/13/2022   MASTECTOMY Left 2007   [facility] REMOVAL  2008   PORTACATH PLACEMENT  2007    HEMATOLOGY/ONCOLOGY HISTORY:  Oncology  History Overview Note  # 2007- LEFT BREAST CA STAGE II [T2N63m; s/p mastec; Dr.Byrnett] ER-Pos/PR Neg; Her- 2 Neu- NEG; ACx4-Taxol x12; Femara; stopped May 2009-stopped follow up.  #JAN 2015- Post mastectomy Chest wall recurrence [chest wall mass bx-]; ER- 90%; PR- NEG; Her2 Neu- NEG; s/p excision [involving skeletal muscle/adipose tissue; clear margins]; CT July 2017- NED;   # Jan 2020- progression/ hilar- START abema [jan 14th]+ faslodex[jan 13th]; OCT-NOV 2022-PET scan shows progressive disease in the bones to liver.  Discontinue Abema+ Faslodex  # DEC 1st, 2022- START Xeloda 2w/1w; MARCH 10th, 2023-progressive disease-lung bone liver  # end of March 2023- taxol weekly; Taxol held since June 1st, 2023-neuropathy     Liver biopsy- 07/05/2022- -preliminary positive for breast cancer; ER positive.  her-2 by FISH +1 [LOW]and NGS- PIC3K POSITIVE  # AUG 24th, 2023- Enhertu.   # DEC 2021- anemia/? CKD [stool ];#Ascending colon mass-incidentally noted on PET scan.  S/p  Dr. AVicente Males  colonoscpy DEC 2022- NEGATIVE; reviewed small adenomas.   # OSTEOPENIA [BMD- June 2016]  # Kidney cysts  DIAGNOSIS: LEFT BREAST CA  STAGE: IV- NED  ;GOALS: pallaitive      Breast cancer metastasized to skin ([facility]  02/05/2014 Initial Diagnosis   Breast cancer metastasized to skin ([facility]   Malignant neoplasm of overlapping sites of left breast in female, estrogen receptor positive ([facility]  07/24/2016 Initial Diagnosis   Cancer of overlapping sites of left female breast ([facility] [place]   01/06/2019 - 08/05/2020 Chemotherapy   Patient is on Treatment Plan : BREAST Abemaciclib + Fulvestrant q28d     03/22/2022 - 05/25/2022 Chemotherapy   Patient is on Treatment Plan : BREAST Paclitaxel D1,8,15 q28d     08/17/2022 - 08/17/2022 Chemotherapy   Patient is on Treatment Plan : BREAST METASTATIC fam-trastuzumab deruxtecan-nxki (Enhertu) q21d     08/17/2022 -  Chemotherapy   Patient is on Treatment Plan : BREAST METASTATIC  Fam-Trastuzumab Deruxtecan-nxki (Enhertu) (5.4) q21d       ALLERGIES:  is allergic to metoprolol.  MEDICATIONS:  Current Outpatient Medications  Medication Sig Dispense Refill   amLODipine (NORVASC) 10 MG tablet TAKE 1 TABLET(10 MG) BY MOUTH DAILY 90 tablet 1   atorvastatin (LIPITOR) 20 MG tablet Take 20 mg by mouth daily at 6 PM.     ergocalciferol (VITAMIN D2) 1.25 MG (50000 UT) capsule Take 1 capsule (50,000 Units total) by mouth once a week. 12 capsule 1   lidocaine-prilocaine (EMLA) cream Apply on the port. 30 -45 min  prior to port access. 30 g 3   lisinopril (ZESTRIL) 2.5 MG tablet TAKE 1 TABLET(2.5 MG) BY MOUTH DAILY 90 tablet 1   meloxicam (MOBIC) 15 MG tablet TAKE 1 TABLET(15 MG) BY MOUTH DAILY (Patient not taking: Reported on 08/01/2022) 90 tablet 1   methocarbamol (ROBAXIN) 500 MG tablet Take 1 tablet (500 mg total) by mouth in the morning and at bedtime. (Patient not taking: Reported on 08/01/2022) 60 tablet 1   mirtazapine (REMERON) 15 MG tablet Take 1 tablet (15 mg total) by mouth at bedtime. 30 tablet 3   omeprazole (PRILOSEC OTC) 20 MG tablet Take 2 tablets (40 mg total)  by mouth daily. 60 tablet 0   ondansetron (ZOFRAN) 8 MG tablet One pill every 8 hours as needed for nausea/vomitting. (Patient not taking: Reported on 08/31/2022) 40 tablet 1   potassium chloride SA (KLOR-CON M) 20 MEQ tablet 1 pill twice a day 60 tablet 1   predniSONE (DELTASONE) 10 MG tablet Take 1 tablet (10 mg total) by mouth daily with breakfast. 30 tablet 0   prochlorperazine (COMPAZINE) 10 MG tablet TAKE 1 TABLET(10 MG) BY MOUTH EVERY 6 HOURS AS NEEDED FOR NAUSEA OR VOMITING (Patient not taking: Reported on 08/31/2022) 40 tablet 1   No current facility-administered medications for this visit.    VITAL SIGNS: There were no vitals taken for this visit. There were no vitals filed for this visit.  Estimated body mass index is 22.85 kg/m as calculated from the following:   Height as of 09/07/22: 5' 5"  (1.651  m).   Weight as of 09/07/22: 137 lb 4.8 oz (62.3 kg).  LABS: CBC:    Component Value Date/Time   WBC 6.6 09/07/2022 0839   HGB 9.4 (L) 09/07/2022 0839   HGB 11.8 (L) 05/30/2014 0127   HCT 30.2 (L) 09/07/2022 0839   HCT 37.3 05/30/2014 0127   PLT 246 09/07/2022 0839   PLT 193 05/30/2014 0127   MCV 88.0 09/07/2022 0839   MCV 90 05/30/2014 0127   NEUTROABS 2.9 09/07/2022 0839   NEUTROABS 5.6 05/30/2014 0127   LYMPHSABS 2.5 09/07/2022 0839   LYMPHSABS 1.7 05/30/2014 0127   MONOABS 0.7 09/07/2022 0839   MONOABS 0.7 05/30/2014 0127   EOSABS 0.2 09/07/2022 0839   EOSABS 0.1 05/30/2014 0127   BASOSABS 0.0 09/07/2022 0839   BASOSABS 0.0 05/30/2014 0127   Comprehensive Metabolic Panel:    Component Value Date/Time   NA 140 09/07/2022 0839   NA 139 05/30/2014 0127   K 3.3 (L) 09/07/2022 0839   K 3.7 05/30/2014 0127   CL 109 09/07/2022 0839   CL 106 05/30/2014 0127   CO2 25 09/07/2022 0839   CO2 25 05/30/2014 0127   BUN 19 09/07/2022 0839   BUN 19 (H) 05/30/2014 0127   CREATININE 0.94 09/07/2022 0839   CREATININE 0.98 05/30/2014 0127   GLUCOSE 163 (H) 09/07/2022 0839   GLUCOSE 136 (H) 05/30/2014 0127   CALCIUM 8.9 09/07/2022 0839   CALCIUM 9.0 05/30/2014 0127   AST 52 (H) 09/07/2022 0839   AST 20 05/08/2014 1131   ALT 22 09/07/2022 0839   ALT 18 05/08/2014 1131   ALKPHOS 195 (H) 09/07/2022 0839   ALKPHOS 58 05/08/2014 1131   BILITOT 0.6 09/07/2022 0839   BILITOT 0.4 05/08/2014 1131   PROT 6.6 09/07/2022 0839   PROT 7.8 05/08/2014 1131   ALBUMIN 3.0 (L) 09/07/2022 0839   ALBUMIN 3.7 05/08/2014 1131    RADIOGRAPHIC STUDIES: No results found.  PERFORMANCE STATUS (ECOG) : 3 - Symptomatic, >50% confined to bed  Review of Systems Unless otherwise noted, a complete review of systems is negative.  Physical Exam General: NAD Cardiovascular: regular rate and rhythm Pulmonary: clear anterior/posterior fields Abdomen: soft, nontender, + bowel sounds GU: no suprapubic  tenderness Extremities: no edema, no joint deformities Skin: no rashes Neurological: Weakness but otherwise nonfocal  IMPRESSION/PLAN: Fatigue -very likely secondary to cancer.  Discussed with patient that this may reflect a new baseline for her.  Patient does have some new URI symptoms which could reflect viral syndrome.  We will add on TSH.  Check UA although currently asymptomatic.  Will  reduce dose of mirtazapine to 7.5 mg nightly.  Rotate from prednisone to dexamethasone 2 mg daily in AM.  Could try American ginseng for neoplasm related fatigue.  Discussed potential trial of methylphenidate.  However, appetite is poor and this can further exacerbate anorexia.  Discussed with Dr. Rogue Compton and will add on MRI brain with and without contrast.  Daughter is interested in someone coming out of the house to check on patient.  We will consult home palliative care.  Case and plan discussed with Dr. Rogue Compton  Patient expressed understanding and was in agreement with this plan. She also understands that She can call clinic at any time with any questions, concerns, or complaints.   Thank you for allowing me to participate in the care of this very pleasant patient.   Time Total: 20 minutes  Visit consisted of counseling and education dealing with the complex and emotionally intense issues of symptom management in the setting of serious illness.Greater than 50%  of this time was spent counseling and coordinating care related to the above assessment and plan.  Signed by: Altha Harm, PhD, NP-C

## 2022-09-26 NOTE — Telephone Encounter (Signed)
Called patient's family - see separate phone note

## 2022-09-27 ENCOUNTER — Other Ambulatory Visit: Payer: Medicare HMO

## 2022-09-28 ENCOUNTER — Encounter: Payer: Self-pay | Admitting: Internal Medicine

## 2022-09-28 ENCOUNTER — Telehealth: Payer: Self-pay | Admitting: *Deleted

## 2022-09-28 LAB — URINE CULTURE: Culture: NO GROWTH

## 2022-09-28 NOTE — Telephone Encounter (Signed)
Daughter Helene Kelp called asking for Dr B to go over patient medicine list and let her know what patient is to be taking and what she is not to be taking.She said there is confusion over patient medications.

## 2022-09-28 NOTE — Telephone Encounter (Signed)
Went over patient's current medications including recent changes at last visit with Pete Glatter, NP with patient's daughter.  Helene Kelp is going to start a weekly medication pill box.  Patient was not taking Amlodipine and she is unsure how long she has not been taking.  Ms. Yusupov does monitor bp at home but Helene Kelp doesn't know readings.  She is going to look at her bp monitor for history of bp readings.  Helene Kelp is also going to start checking and keeping a daily log of bp.  Advised she continue holding the Amlodipine until she knows her home blood pressure readings and if they have been elevated to add the Amlodipine to her medication box and continue monitoring bp to ensure bp doesn't drop to low.  Advised she could call or send a MyChart message with any concerns.

## 2022-09-29 ENCOUNTER — Other Ambulatory Visit: Payer: Self-pay | Admitting: Internal Medicine

## 2022-09-29 DIAGNOSIS — R296 Repeated falls: Secondary | ICD-10-CM

## 2022-09-29 DIAGNOSIS — Z170 Estrogen receptor positive status [ER+]: Secondary | ICD-10-CM

## 2022-09-30 ENCOUNTER — Ambulatory Visit: Payer: Medicare HMO

## 2022-10-02 ENCOUNTER — Telehealth: Payer: Self-pay | Admitting: *Deleted

## 2022-10-02 NOTE — Patient Outreach (Addendum)
  Care Coordination   Initial Visit Note   10/02/2022 Name: MARKESIA CRILLY MRN: [id number] DOB: June 08, 1941  WYLIE RUSSON is a 81 y.o. year old female who sees Pcp, No for primary care. I spoke with  [name] by phone today and also her daughter, primary care giver, Rockey Situ.  What matters to the patients health and wellness today?  Having a palliative care consult tomorrow.    Goals Addressed   None     SDOH assessments and interventions completed:  Yes  SDOH Interventions Today    Flowsheet Row Most Recent Value  SDOH Interventions   Food Insecurity Interventions Intervention Not Indicated  Transportation Interventions Intervention Not Indicated        Care Coordination Interventions Activated:  No  Care Coordination Interventions:  No, not indicated   Follow up plan:  Daughter to call NP after discussion with palliative care.    Encounter Outcome:  Pt. Visit Completed   Kayleen Memos C. Myrtie Neither, MSN, [facility] Gerontological Nurse Practitioner [facility] Care Management [phone]

## 2022-10-03 ENCOUNTER — Telehealth: Payer: Medicaid Other | Admitting: Student

## 2022-10-05 ENCOUNTER — Other Ambulatory Visit: Payer: Self-pay | Admitting: Internal Medicine

## 2022-10-05 NOTE — Telephone Encounter (Signed)
Component Ref Range & Units 9 d ago (09/26/22) 4 wk ago (09/07/22) 1 mo ago (08/31/22) 1 mo ago (08/24/22) 1 mo ago (08/17/22) 2 mo ago (08/01/22) 2 mo ago (07/08/22)  Potassium 3.5 - 5.1 mmol/L 3.8  3.3 Low   3.3 Low   3.6  3.4 Low   3.8  3.8

## 2022-10-10 ENCOUNTER — Inpatient Hospital Stay: Payer: Medicare HMO

## 2022-10-12 ENCOUNTER — Ambulatory Visit
Admission: RE | Admit: 2022-10-12 | Discharge: 2022-10-12 | Disposition: A | Discharge status: Home / Self Care | Payer: Medicare HMO | Source: Ambulatory Visit | Attending: Internal Medicine | Admitting: Internal Medicine

## 2022-10-12 DIAGNOSIS — Z170 Estrogen receptor positive status [ER+]: Secondary | ICD-10-CM | POA: Diagnosis not present

## 2022-10-12 DIAGNOSIS — J929 Pleural plaque without asbestos: Secondary | ICD-10-CM | POA: Diagnosis not present

## 2022-10-12 DIAGNOSIS — Z853 Personal history of malignant neoplasm of breast: Secondary | ICD-10-CM | POA: Diagnosis not present

## 2022-10-12 DIAGNOSIS — C787 Secondary malignant neoplasm of liver and intrahepatic bile duct: Secondary | ICD-10-CM | POA: Diagnosis not present

## 2022-10-12 DIAGNOSIS — N1330 Unspecified hydronephrosis: Secondary | ICD-10-CM | POA: Diagnosis not present

## 2022-10-12 DIAGNOSIS — C50812 Malignant neoplasm of overlapping sites of left female breast: Secondary | ICD-10-CM | POA: Diagnosis not present

## 2022-10-12 DIAGNOSIS — R296 Repeated falls: Secondary | ICD-10-CM | POA: Insufficient documentation

## 2022-10-12 DIAGNOSIS — J432 Centrilobular emphysema: Secondary | ICD-10-CM | POA: Diagnosis not present

## 2022-10-12 DIAGNOSIS — I771 Stricture of artery: Secondary | ICD-10-CM | POA: Diagnosis not present

## 2022-10-12 DIAGNOSIS — C7951 Secondary malignant neoplasm of bone: Secondary | ICD-10-CM | POA: Diagnosis not present

## 2022-10-12 DIAGNOSIS — I671 Cerebral aneurysm, nonruptured: Secondary | ICD-10-CM | POA: Diagnosis not present

## 2022-10-12 DIAGNOSIS — Q630 Accessory kidney: Secondary | ICD-10-CM | POA: Diagnosis not present

## 2022-10-12 DIAGNOSIS — K769 Liver disease, unspecified: Secondary | ICD-10-CM | POA: Diagnosis not present

## 2022-10-12 DIAGNOSIS — I639 Cerebral infarction, unspecified: Secondary | ICD-10-CM | POA: Diagnosis not present

## 2022-10-12 MED ORDER — IOHEXOL 300 MG/ML  SOLN
80.0000 mL | Freq: Once | INTRAMUSCULAR | Status: AC | PRN
  Administered 2022-10-12: 80 mL via INTRAVENOUS

## 2022-10-17 ENCOUNTER — Telehealth: Payer: Self-pay | Admitting: *Deleted

## 2022-10-17 NOTE — Telephone Encounter (Addendum)
Per Dr Rogue Bussing, he will call patient daughter and he wants to see her on Friday at 2 PM with labs and he also wants her to be seen by palliative care, but J Borders is out of office Friday. Patient is also scheduled for Thursday for lab / [facility]

## 2022-10-17 NOTE — Telephone Encounter (Signed)
Helene Kelp called reporting that patient is having a constant sharp pain in her side nad a pain in her chest when she coughs for the past week. She reports that patient had recent CT done and disease progression may be the cause of her pain. She is asking for pain control measures. She has no fever or congestion, she does have a non productive occasional cough. Please advise IMPRESSION: 1. Progressive hepatic metastasis, as evidenced by enlargement of the dominant segment 4A mass and development of multiple tiny bilobar lesions. 2. Right-greater-than-left pleural-based nodularity is felt to be similar. 3. Widespread osseous metastasis. Slight increase in sclerosis which is most apparent throughout the thoracolumbar spine and could represent progression or healing of previously lytic lesions. 4. Right-sided duplicated renal collecting system with resolution of hydronephrosis. Heterogeneous enhancement in the lower pole right kidney is subtle and suspicious for pyelonephritis. Similar to on the prior. 5. Cholecystectomy with chronic biliary duct dilatation. Most likely within normal variation. Correlate with bilirubin levels. 6. New small right pleural effusion. 7. Aortic atherosclerosis (ICD10-I70.0), coronary artery atherosclerosis and emphysema (ICD10-J43.9).     Electronically Signed   By: Abigail Miyamoto M.D.   On: 10/12/2022 15:01

## 2022-10-19 ENCOUNTER — Inpatient Hospital Stay: Payer: Medicare HMO

## 2022-10-19 ENCOUNTER — Telehealth: Payer: Self-pay | Admitting: *Deleted

## 2022-10-19 ENCOUNTER — Other Ambulatory Visit: Payer: Medicare HMO

## 2022-10-19 ENCOUNTER — Encounter: Payer: Self-pay | Admitting: Hospice and Palliative Medicine

## 2022-10-19 ENCOUNTER — Other Ambulatory Visit: Payer: Self-pay

## 2022-10-19 ENCOUNTER — Inpatient Hospital Stay (HOSPITAL_BASED_OUTPATIENT_CLINIC_OR_DEPARTMENT_OTHER): Payer: Medicare HMO | Admitting: Hospice and Palliative Medicine

## 2022-10-19 VITALS — BP 133/55 | HR 90 | Temp 99.9°F | Resp 17

## 2022-10-19 DIAGNOSIS — R530 Neoplastic (malignant) related fatigue: Secondary | ICD-10-CM | POA: Diagnosis not present

## 2022-10-19 DIAGNOSIS — E860 Dehydration: Secondary | ICD-10-CM

## 2022-10-19 DIAGNOSIS — R739 Hyperglycemia, unspecified: Secondary | ICD-10-CM | POA: Diagnosis not present

## 2022-10-19 DIAGNOSIS — C7800 Secondary malignant neoplasm of unspecified lung: Secondary | ICD-10-CM | POA: Diagnosis not present

## 2022-10-19 DIAGNOSIS — Z170 Estrogen receptor positive status [ER+]: Secondary | ICD-10-CM | POA: Diagnosis not present

## 2022-10-19 DIAGNOSIS — C50812 Malignant neoplasm of overlapping sites of left female breast: Secondary | ICD-10-CM

## 2022-10-19 DIAGNOSIS — R630 Anorexia: Secondary | ICD-10-CM | POA: Diagnosis not present

## 2022-10-19 DIAGNOSIS — C787 Secondary malignant neoplasm of liver and intrahepatic bile duct: Secondary | ICD-10-CM | POA: Diagnosis not present

## 2022-10-19 DIAGNOSIS — C7951 Secondary malignant neoplasm of bone: Secondary | ICD-10-CM | POA: Diagnosis not present

## 2022-10-19 LAB — COMPREHENSIVE METABOLIC PANEL
ALT: 15 U/L (ref 0–44)
AST: 41 U/L (ref 15–41)
Albumin: 2.8 g/dL — ABNORMAL LOW (ref 3.5–5.0)
Alkaline Phosphatase: 349 U/L — ABNORMAL HIGH (ref 38–126)
Anion gap: 15 (ref 5–15)
BUN: 44 mg/dL — ABNORMAL HIGH (ref 8–23)
CO2: 19 mmol/L — ABNORMAL LOW (ref 22–32)
Calcium: 8.4 mg/dL — ABNORMAL LOW (ref 8.9–10.3)
Chloride: 97 mmol/L — ABNORMAL LOW (ref 98–111)
Creatinine, Ser: 1.56 mg/dL — ABNORMAL HIGH (ref 0.44–1.00)
GFR, Estimated: 33 mL/min — ABNORMAL LOW (ref >60)
Glucose, Bld: 579 mg/dL (ref 70–99)
Potassium: 4.7 mmol/L (ref 3.5–5.1)
Sodium: 131 mmol/L — ABNORMAL LOW (ref 135–145)
Total Bilirubin: 1.4 mg/dL — ABNORMAL HIGH (ref 0.3–1.2)
Total Protein: 7.8 g/dL (ref 6.5–8.1)

## 2022-10-19 LAB — CBC WITH DIFFERENTIAL/PLATELET
Abs Immature Granulocytes: 0.08 10*3/uL — ABNORMAL HIGH (ref 0.00–0.07)
Basophils Absolute: 0 10*3/uL (ref 0.0–0.1)
Basophils Relative: 0 %
Eosinophils Absolute: 0 10*3/uL (ref 0.0–0.5)
Eosinophils Relative: 0 %
HCT: 30.7 % — ABNORMAL LOW (ref 36.0–46.0)
Hemoglobin: 9.6 g/dL — ABNORMAL LOW (ref 12.0–15.0)
Immature Granulocytes: 1 %
Lymphocytes Relative: 21 %
Lymphs Abs: 1.7 10*3/uL (ref 0.7–4.0)
MCH: 26.9 pg (ref 26.0–34.0)
MCHC: 31.3 g/dL (ref 30.0–36.0)
MCV: 86 fL (ref 80.0–100.0)
Monocytes Absolute: 0.6 10*3/uL (ref 0.1–1.0)
Monocytes Relative: 7 %
Neutro Abs: 5.9 10*3/uL (ref 1.7–7.7)
Neutrophils Relative %: 71 %
Platelets: 262 10*3/uL (ref 150–400)
RBC: 3.57 MIL/uL — ABNORMAL LOW (ref 3.87–5.11)
RDW: 20.7 % — ABNORMAL HIGH (ref 11.5–15.5)
WBC: 8.3 10*3/uL (ref 4.0–10.5)
nRBC: 1.3 % — ABNORMAL HIGH (ref 0.0–0.2)

## 2022-10-19 MED ORDER — INSULIN GLARGINE 100 UNIT/ML ~~LOC~~ SOLN: 10.0000 [IU] | Freq: Every day | SUBCUTANEOUS | 0 refills | Status: AC

## 2022-10-19 MED ORDER — SODIUM CHLORIDE 0.9 % IV SOLN
Freq: Once | INTRAVENOUS | Status: AC
  Filled 2022-10-19: qty 250

## 2022-10-19 MED ORDER — SODIUM CHLORIDE 0.9% FLUSH
10.0000 mL | Freq: Once | INTRAVENOUS | Status: AC
  Administered 2022-10-19: 10 mL via INTRAVENOUS
  Filled 2022-10-19: qty 10

## 2022-10-19 MED ORDER — OXYCODONE HCL 5 MG PO TABS: 5.0000 mg | ORAL_TABLET | ORAL | 0 refills | Status: AC | PRN

## 2022-10-19 MED ORDER — HEPARIN SOD (PORK) LOCK FLUSH 100 UNIT/ML IV SOLN
500.0000 [IU] | Freq: Once | INTRAVENOUS | Status: AC
  Administered 2022-10-19: 500 [IU] via INTRAVENOUS
  Filled 2022-10-19: qty 5

## 2022-10-19 MED ORDER — BLOOD GLUCOSE MONITORING SUPPL KIT: 1.0000 | PACK | Freq: Three times a day (TID) | 0 refills | Status: AC

## 2022-10-19 MED ORDER — "INSULIN SYRINGE 29G X 1/2"" 1 ML MISC": 30.0000 | Freq: Every day | 3 refills | Status: AC

## 2022-10-19 MED ORDER — BLOOD GLUCOSE TEST STRIPS 333 VI STRP: 90.0000 | ORAL_STRIP | Freq: Three times a day (TID) | 3 refills | Status: AC

## 2022-10-19 MED ORDER — ACCU-CHEK SOFTCLIX LANCETS MISC: 12 refills | Status: AC

## 2022-10-19 NOTE — Telephone Encounter (Addendum)
Daughter called asking for an order for insulin syringes/needles and a glucose monitor and test strips to be sent to [facility], probably needs lancets too

## 2022-10-19 NOTE — Progress Notes (Signed)
10:45 Jessica Compton from lab notified of critical lab value glucose 579. Results relayed to [employer]. Pt does not have a known Diabetes diagnosis. Pt is receiving 1 L NS this am in clinic. Pt and family declined going to the ED. Interested in hospice care. Josh sending prescription for insulin into pharmacy. Pt will return tomm am for lab recheck and IVF's.

## 2022-10-19 NOTE — Progress Notes (Signed)
Poor po intake. No appetite. Takes sips and bites of food. Increased fatigue and weakness. Difficulty ambulating. Dyspnea with exertion. Productive cough. Reports more frequent softer stools recently. Complains of soreness or tightness along upper abd /low rib cage area, feels like her bra is too tight but she is not wearing a bra.

## 2022-10-19 NOTE — Telephone Encounter (Signed)
Rx for supplies sent to [facility].

## 2022-10-19 NOTE — Progress Notes (Signed)
Symptom Management [place] at [facility] [phone] [phone]) [phone]  Patient Care Team: Pcp, No as PCP - General Byrnett, Forest Gleason, MD (General Surgery) Marden Noble, MD (Internal Medicine) [name], MD as Consulting Physician (Internal Medicine)   NAME OF PATIENT: Jessica Compton  [id number]  11-Jul-1941   DATE OF VISIT: 10/19/22  REASON FOR CONSULT: ABBIGAIL Compton is a 81 y.o. female with multiple medical problems including stage IV ER/PR positive left breast cancer with metastasis to bone and liver and lung.  Patient was on treatment with Taxol but this was discontinued due to progression and overall poor tolerance with weakness and decline.  She was rotated to Enhertu.   INTERVAL HISTORY: Patient saw Dr. Rogue Bussing on 09/07/2022.  Patient was found to have overall decline in performance status and poor tolerance of Enhertu and so treatments were subsequently discontinued.  She underwent CT of the chest, abdomen, and pelvis on 10/12/2022, which revealed overall disease progression with progressive hepatic metastasis, widespread osseous metastasis.  Patient presents to [facility] today with complaint of worsening weakness/fatigue.  Reportedly, appetite is minimal.  Patient has some upper abdominal pain but denies other symptomatic complaints or concerns.  No fever or chills.  No urinary complaints.  Denies any neurologic complaints. Denies recent fevers or illnesses. Denies any easy bleeding or bruising.  Denies urinary complaints. Patient offers no further specific complaints today.   PAST MEDICAL HISTORY: Past Medical History:  Diagnosis Date   Aneurysm ([place]) 2007   Arthritis    Cancer ([place]) 2007   diagnosed 01/07 left breast with simple mastectomu & sn bx. The pt had a 3.5cm tumor. Frozen section report on the 2 sn were negative. On permanent sections a 0.66m micrometastasis was identified. She was not felt to require  a complete axillary dissection. She has completed her Tamoxifen therapy and has been released from routine f/u with medical oncology service.   COPD (chronic obstructive pulmonary disease) (HCC)    Diabetes mellitus without complication ([facility]    Hyperlipidemia    Hypertension    since age 81  Malignant neoplasm of upper-outer quadrant of female breast ([facility] 12/2013   Mastectomy site recurrence resected 01/26/2014, ER 90%, PR 0%, HER-2/neu nonamplified.   Other benign neoplasm of connective and other soft tissue of trunk, unspecified 2014   Personal history of colonic polyps    Personal history of tobacco use, presenting hazards to health    Special screening for malignant neoplasms, colon    Unspecified disorder of skin and subcutaneous tissue 02/19/2013   biopsy of skin over the sternum on the right breast showed hypertrophic scar,keloid formation.    PAST SURGICAL HISTORY:  Past Surgical History:  Procedure Laterality Date   ABDOMINAL HYSTERECTOMY  2004   total   BREAST BIOPSY Right January 26, 2014   Core biopsy for mild increase breast uptake on PET scan, usual ductal hyperplasia and stromal fibrosis   BREAST SURGERY Left 2007   mastectomy   chest wall mass  01/26/14   CHOLECYSTECTOMY  2007   COLONOSCOPY W/ POLYPECTOMY  2011   Dr. EBrynda Greathouse  COLONOSCOPY WITH PROPOFOL N/A 12/22/2021   Procedure: COLONOSCOPY WITH PROPOFOL;  Surgeon: AJonathon Bellows MD;  [facility];  Service: Gastroenterology;  Laterality: N/A;   ESOPHAGOGASTRODUODENOSCOPY (EGD) WITH PROPOFOL N/A 07/08/2022   Procedure: ESOPHAGOGASTRODUODENOSCOPY (EGD) WITH PROPOFOL;  Surgeon: LLesly Rubenstein MD;  Location: [facility] ENDOSCOPY;  Service: Endoscopy;  Laterality: N/A;  EYE SURGERY Right    cataract surgery   head surgery  1991   IR IMAGING GUIDED PORT INSERTION  03/13/2022   MASTECTOMY Left 2007   PORT-A-CATH REMOVAL  2008   PORTACATH PLACEMENT  2007    HEMATOLOGY/ONCOLOGY HISTORY:  Oncology History  Overview Note  # 2007- LEFT BREAST CA STAGE II [T2N1mi; s/p mastec; Dr.Byrnett] ER-Pos/PR Neg; Her- 2 Neu- NEG; ACx4-Taxol x12; Femara; stopped May 2009-stopped follow up.  #JAN 2015- Post mastectomy Chest wall recurrence [chest wall mass bx-]; ER- 90%; PR- NEG; Her2 Neu- NEG; s/p excision [involving skeletal muscle/adipose tissue; clear margins]; CT July 2017- NED;   # Jan 2020- progression/ hilar- START abema [jan 14th]+ faslodex[jan 13th]; OCT-NOV 2022-PET scan shows progressive disease in the bones to liver.  Discontinue Abema+ Faslodex  # DEC 1st, 2022- START Xeloda 2w/1w; MARCH 10th, 2023-progressive disease-lung bone liver  # end of March 2023- taxol weekly; Taxol held since June 1st, 2023-neuropathy     Liver biopsy- 07/05/2022- -preliminary positive for breast cancer; ER positive.  her-2 by FISH +1 [LOW]and NGS- PIC3K POSITIVE  # AUG 24th, 2023- Enhertu.   # DEC 2021- anemia/? CKD [stool ];#Ascending colon mass-incidentally noted on PET scan.  S/p  Dr. Anna;  colonoscpy DEC 2022- NEGATIVE; reviewed small adenomas.   # OSTEOPENIA [BMD- June 2016]  # Kidney cysts  DIAGNOSIS: LEFT BREAST CA  STAGE: IV- NED  ;GOALS: pallaitive      Breast cancer metastasized to skin (HCC)  02/05/2014 Initial Diagnosis   Breast cancer metastasized to skin (HCC)   Malignant neoplasm of overlapping sites of left breast in female, estrogen receptor positive (HCC)  07/24/2016 Initial Diagnosis   Cancer of overlapping sites of left female breast (HCC)   01/06/2019 - 08/05/2020 Chemotherapy   Patient is on Treatment Plan : BREAST Abemaciclib + Fulvestrant q28d     03/22/2022 - 05/25/2022 Chemotherapy   Patient is on Treatment Plan : BREAST Paclitaxel D1,8,15 q28d     08/17/2022 - 08/17/2022 Chemotherapy   Patient is on Treatment Plan : BREAST METASTATIC fam-trastuzumab deruxtecan-nxki ([facility]tu) q21d     08/17/2022 -  Chemotherapy   Patient is on Treatment Plan : BREAST METASTATIC Fam-Trastuzumab  Deruxtecan-nxki (Enhertu) (5.4) q21d       ALLERGIES:  is allergic to metoprolol.  MEDICATIONS:  Current Outpatient Medications  Medication Sig Dispense Refill   amLODipine (NORVASC) 10 MG tablet TAKE 1 TABLET(10 MG) BY MOUTH DAILY 90 tablet 1   atorvastatin (LIPITOR) 20 MG tablet Take 20 mg by mouth daily at 6 PM.     dexamethasone (DECADRON) 2 MG tablet Take 1 tablet (2 mg total) by mouth daily. 30 tablet 1   ergocalciferol (VITAMIN D2) 1.25 MG (50000 UT) capsule Take 1 capsule (50,000 Units total) by mouth once a week. 12 capsule 1   lidocaine-prilocaine (EMLA) cream Apply on the port. 30 -45 min  prior to port access. 30 g 3   lisinopril (ZESTRIL) 2.5 MG tablet TAKE 1 TABLET(2.5 MG) BY MOUTH DAILY 90 tablet 1   mirtazapine (REMERON) 15 MG tablet Take 0.5 tablets (7.5 mg total) by mouth at bedtime. 30 tablet 3   omeprazole (PRILOSEC OTC) 20 MG tablet Take 2 tablets (40 mg total) by mouth daily. 60 tablet 0   potassium chloride SA (KLOR-CON M) 20 MEQ tablet TAKE 1 TABLET BY MOUTH TWICE DAILY 60 tablet 1   meloxicam (MOBIC) 15 MG tablet TAKE 1 TABLET(15 MG) BY MOUTH DAILY (Patient not taking: Reported on   08/01/2022) 90 tablet 1   methocarbamol (ROBAXIN) 500 MG tablet Take 1 tablet (500 mg total) by mouth in the morning and at bedtime. (Patient not taking: Reported on 08/01/2022) 60 tablet 1   ondansetron (ZOFRAN) 8 MG tablet One pill every 8 hours as needed for nausea/vomitting. (Patient not taking: Reported on 08/31/2022) 40 tablet 1   prochlorperazine (COMPAZINE) 10 MG tablet TAKE 1 TABLET(10 MG) BY MOUTH EVERY 6 HOURS AS NEEDED FOR NAUSEA OR VOMITING (Patient not taking: Reported on 08/31/2022) 40 tablet 1   No current facility-administered medications for this visit.   Facility-Administered Medications Ordered in Other Visits  Medication Dose Route Frequency Provider Last Rate Last Admin   heparin lock flush 100 unit/mL  500 Units Intravenous Once Harmon Bommarito, Kirt Boys, NP        VITAL  SIGNS: BP (!) 133/55 (BP Location: Right Arm, Patient Position: Sitting)   Pulse 90   Temp 99.9 F (37.7 C) (Tympanic)   Resp 17   SpO2 97%  Filed Weights    Estimated body mass index is 21.98 kg/m as calculated from the following:   Height as of 09/07/22: 5' 5" (1.651 m).   Weight as of 09/26/22: 132 lb 1.6 oz (59.9 kg).  LABS: CBC:    Component Value Date/Time   WBC 8.3 10/19/2022 0945   HGB 9.6 (L) 10/19/2022 0945   HGB 11.8 (L) 05/30/2014 0127   HCT 30.7 (L) 10/19/2022 0945   HCT 37.3 05/30/2014 0127   PLT 262 10/19/2022 0945   PLT 193 05/30/2014 0127   MCV 86.0 10/19/2022 0945   MCV 90 05/30/2014 0127   NEUTROABS 5.9 10/19/2022 0945   NEUTROABS 5.6 05/30/2014 0127   LYMPHSABS 1.7 10/19/2022 0945   LYMPHSABS 1.7 05/30/2014 0127   MONOABS 0.6 10/19/2022 0945   MONOABS 0.7 05/30/2014 0127   EOSABS 0.0 10/19/2022 0945   EOSABS 0.1 05/30/2014 0127   BASOSABS 0.0 10/19/2022 0945   BASOSABS 0.0 05/30/2014 0127   Comprehensive Metabolic Panel:    Component Value Date/Time   NA 136 09/26/2022 1024   NA 139 05/30/2014 0127   K 3.8 09/26/2022 1024   K 3.7 05/30/2014 0127   CL 105 09/26/2022 1024   CL 106 05/30/2014 0127   CO2 20 (L) 09/26/2022 1024   CO2 25 05/30/2014 0127   BUN 23 09/26/2022 1024   BUN 19 (H) 05/30/2014 0127   CREATININE 1.20 (H) 09/26/2022 1024   CREATININE 0.98 05/30/2014 0127   GLUCOSE 192 (H) 09/26/2022 1024   GLUCOSE 136 (H) 05/30/2014 0127   CALCIUM 8.8 (L) 09/26/2022 1024   CALCIUM 9.0 05/30/2014 0127   AST 51 (H) 09/26/2022 1024   AST 20 05/08/2014 1131   ALT 18 09/26/2022 1024   ALT 18 05/08/2014 1131   ALKPHOS 164 (H) 09/26/2022 1024   ALKPHOS 58 05/08/2014 1131   BILITOT 1.3 (H) 09/26/2022 1024   BILITOT 0.4 05/08/2014 1131   PROT 7.5 09/26/2022 1024   PROT 7.8 05/08/2014 1131   ALBUMIN 3.1 (L) 09/26/2022 1024   ALBUMIN 3.7 05/08/2014 1131    RADIOGRAPHIC STUDIES: CT HEAD W & WO CONTRAST (5MM)  Result Date:  10/15/2022 CLINICAL DATA:  History of breast cancer and falls. EXAM: CT HEAD WITHOUT AND WITH CONTRAST TECHNIQUE: Contiguous axial images were obtained from the base of the skull through the vertex without and with intravenous contrast. RADIATION DOSE REDUCTION: This exam was performed according to the departmental dose-optimization program which includes automated exposure control, adjustment  of the mA and/or kV according to patient size and/or use of iterative reconstruction technique. CONTRAST:  80mL OMNIPAQUE IOHEXOL 300 MG/ML  SOLN COMPARISON:  None Available. FINDINGS: Brain: No enhancement or swelling to suggest metastatic disease to the brain. Prior right MCA aneurysm clipping. Remote perforator infarct affecting the right basal ganglia. Generalized cerebral volume loss. No acute or subacute infarct, hydrocephalus, or collection. Cerebral volume loss. Tiny scattered areas of dural based calcification which are usually chronic and incidental. Vascular: Major vessels are enhancing Skull: Unremarkable right pterional craniotomy for aneurysm treatment. There is ill-defined calvarial heterogeneity in the upper right occipital bone which could reflect metastatic disease. No underlying dural thickening or enhancement noted. Sinuses/Orbits: Bilateral cataract resection. No sinusitis or mastoid opacification. IMPRESSION: 1. No evidence of brain metastasis. No specific cause for history of falls. 2. Possible right occipital bone metastasis. 3. Sequela of right MCA aneurysm clipping. Remote basal ganglia infarct. Electronically Signed   By: Jonathan  Watts M.D.   On: 10/15/2022 08:33   CT CHEST ABDOMEN PELVIS W CONTRAST  Result Date: 10/12/2022 CLINICAL DATA:  Stage IV breast cancer. Evaluate treatment response. * Tracking Code: BO * EXAM: CT CHEST, ABDOMEN, AND PELVIS WITH CONTRAST TECHNIQUE: Multidetector CT imaging of the chest, abdomen and pelvis was performed following the standard protocol during bolus  administration of intravenous contrast. RADIATION DOSE REDUCTION: This exam was performed according to the departmental dose-optimization program which includes automated exposure control, adjustment of the mA and/or kV according to patient size and/or use of iterative reconstruction technique. CONTRAST:  80mL OMNIPAQUE IOHEXOL 300 MG/ML  SOLN COMPARISON:  07/07/2022 abdominopelvic CT. Chest abdomen and pelvic CTs, staging, 06/09/2022. FINDINGS: CT CHEST FINDINGS Cardiovascular: Right Port-A-Cath tip mid right atrium. Aortic atherosclerosis. Tortuous thoracic aorta. Mild cardiomegaly with multivessel coronary artery calcification. No pericardial effusion. No central pulmonary embolism, on this non-dedicated study. Multivessel coronary artery atherosclerosis. Mediastinum/Nodes: No supraclavicular adenopathy. No subpectoral or axillary adenopathy. No mediastinal or hilar adenopathy. Lungs/Pleura: Trace right pleural fluid, new. Moderate centrilobular emphysema. Pleural-based nodularity within the right lung. Example right lower lobe at 1.8 x 1.5 cm on 109/4 versus 2.0 x 1.2 cm on 06/09/2022. More lateral and posterior in the left lower lobe at 8 mm on 113/4 versus 7 mm on the prior (when remeasured). Anterior right upper lobe 3 mm pleural-based nodule on 71/4 is new since the prior. Thickening of the right-sided fissures including on 99/4 is similar. More mild/subtle left-sided pleural-based nodularity including at up to 4 mm on 97/4 is present on the prior, felt to be relatively similar. Musculoskeletal: Left mastectomy. CT ABDOMEN PELVIS FINDINGS Hepatobiliary: Development of subtle hypoattenuating liver lesions on the order of 5 mm and less within both lobes. Example 46/2 within the high right hepatic lobe. The dominant segment 4A lesion measures 3.2 x 1.9 cm on 54/2 versus 1.6 x 0.9 cm on 06/09/2022. Cholecystectomy. Similar mild intrahepatic and moderate extrahepatic duct dilatation including a common duct of 1.5  cm. No obstructive stone or mass identified. Pancreas: Normal, without mass or ductal dilatation. Spleen: Normal in size, without focal abnormality. Adrenals/Urinary Tract: Normal adrenal glands. Duplicated right renal collecting system. Resolved hydronephrosis. Lower pole left renal 2.3 cm cyst. Too small to characterize lesions in both kidneys. Heterogeneous enhancement involving the lower pole right kidney is relatively subtle including on 68/2, 26/7, felt to be chronic. Punctate stone or dystrophic calcification in the lower pole right kidney. Normal urinary bladder. Stomach/Bowel: Normal stomach, without wall thickening. Scattered colonic diverticula. Normal terminal   ileum and appendix. Normal small bowel. Vascular/Lymphatic: Aortic atherosclerosis. No abdominopelvic adenopathy. Reproductive: Hysterectomy.  No adnexal mass. Other: No significant free fluid. Mild pelvic floor laxity. No free intraperitoneal air. No evidence of omental or peritoneal disease. Musculoskeletal: Widespread sclerotic and lytic metastasis again identified. Areas of relatively diffuse sclerosis throughout the thoracolumbar spine are felt to be slightly increased. No complicating compression deformity. IMPRESSION: 1. Progressive hepatic metastasis, as evidenced by enlargement of the dominant segment 4A mass and development of multiple tiny bilobar lesions. 2. Right-greater-than-left pleural-based nodularity is felt to be similar. 3. Widespread osseous metastasis. Slight increase in sclerosis which is most apparent throughout the thoracolumbar spine and could represent progression or healing of previously lytic lesions. 4. Right-sided duplicated renal collecting system with resolution of hydronephrosis. Heterogeneous enhancement in the lower pole right kidney is subtle and suspicious for pyelonephritis. Similar to on the prior. 5. Cholecystectomy with chronic biliary duct dilatation. Most likely within normal variation. Correlate with  bilirubin levels. 6. New small right pleural effusion. 7. Aortic atherosclerosis (ICD10-I70.0), coronary artery atherosclerosis and emphysema (ICD10-J43.9). Electronically Signed   By: Jessica Miyamoto M.D.   On: 10/12/2022 15:01    PERFORMANCE STATUS (ECOG) : 3 - Symptomatic, >50% confined to bed  Review of Systems Unless otherwise noted, a complete review of systems is negative.  Physical Exam General: NAD Cardiovascular: regular rate and rhythm Pulmonary: clear anterior/posterior fields Abdomen: soft, nontender, + bowel sounds GU: no suprapubic tenderness Extremities: no edema, no joint deformities Skin: no rashes Neurological: Weakness but otherwise nonfocal  IMPRESSION/PLAN: Dehydration -secondary to poor oral intake and hyperglycemia. Proceed with IVFs. Will bring patient back tomorrow for recheck labs and fluids  Hyperglycemia -likely underlying diabetes but patient has been on dexamethasone, which is likely only exacerbated her hyperglycemia.  We will discontinue dexamethasone.  Discussed transferring patient to the ER for further management but patient and family declined that option.  Instead, they would prefer to go home and start insulin.  Patient reports that she is well accustomed to diabetes management through the care of her sister.  Hospice referral and will have their assistance with management (see below).   Neoplasm related pain -upper abdominal pain is likely secondary to hepatic metastasis.  Start oxycodone as needed.  Daily bowel regimen.  Stage IV breast cancer -progression on recent imaging.  Daughter says that she had a conversation by phone with Dr. Rogue Bussing and family heard that there were no additional options available for treatment and instead patient/family wanted to focus on comfort measures until end-of-life.  We discussed hospice involvement today and patient and family were in agreement.  I spoke with the hospice liaison who was able to coordinate patient  being seen by hospice this evening. Discussed CODE STATUS and patient and family in agreement with DNR/DNI.  Case and plan discussed with Dr. Grayland Ormond, who was covering for Dr. Rogue Bussing  Patient expressed understanding and was in agreement with this plan. She also understands that She can call clinic at any time with any questions, concerns, or complaints.   Thank you for allowing me to participate in the care of this very pleasant patient.   Time Total: 25 minutes  Visit consisted of counseling and education dealing with the complex and emotionally intense issues of symptom management in the setting of serious illness.Greater than 50%  of this time was spent counseling and coordinating care related to the above assessment and plan.  Signed by: Altha Harm, PhD, NP-C

## 2022-10-19 NOTE — Telephone Encounter (Signed)
Call returned to [place] to inform that supplies request sent to [facility]

## 2022-10-20 ENCOUNTER — Telehealth: Payer: Self-pay

## 2022-10-20 ENCOUNTER — Inpatient Hospital Stay: Payer: Medicare HMO

## 2022-10-20 ENCOUNTER — Other Ambulatory Visit: Payer: Medicare HMO

## 2022-10-20 ENCOUNTER — Encounter: Payer: Self-pay | Admitting: Internal Medicine

## 2022-10-20 ENCOUNTER — Ambulatory Visit: Payer: Medicare HMO | Admitting: Internal Medicine

## 2022-10-20 NOTE — Telephone Encounter (Signed)
Called to check on patient as she had not shown up yet for scheduled appointment. Per daughter, patient is more lethargic and now not able to get out of bed. Daughter reports that blood sugars continue to run in the 400's. She stated that hospice will be coming out again today. Cancelled pt's appointments for today and informed daughter to call us if needed.

## 2022-10-20 NOTE — Progress Notes (Signed)
Spoke to daughter [facility] is currently evaluating the patient's mother.  Discussed with the hospice nurse.  Okay to use oxycodone crushed for dyspnea.  If not improved to call us back for morphine elixir.

## 2022-10-21 LAB — CEA: CEA: 67.9 ng/mL — ABNORMAL HIGH (ref 0.0–4.7)

## 2022-10-21 LAB — CANCER ANTIGEN 27.29: CA 27.29: 689.2 U/mL — ABNORMAL HIGH (ref 0.0–38.6)

## 2022-10-24 ENCOUNTER — Telehealth: Payer: Self-pay | Admitting: *Deleted

## 2022-10-25 NOTE — Telephone Encounter (Signed)
Megan with Hospice called to report that patient passed away at 5:56 AM this morning

## 2022-10-25 DEATH — deceased

## 2022-10-26 ENCOUNTER — Ambulatory Visit: Payer: Medicare HMO | Admitting: Internal Medicine

## 2022-10-26 ENCOUNTER — Encounter: Payer: Self-pay | Admitting: Internal Medicine

## 2022-10-26 ENCOUNTER — Other Ambulatory Visit: Payer: Medicare HMO

## 2022-10-26 NOTE — Progress Notes (Signed)
I spoke to patient's daughter and offered at this.  Family appreciated of the care offered at the cancer center.  GB

## 2022-11-15 ENCOUNTER — Other Ambulatory Visit: Payer: Self-pay | Admitting: Internal Medicine

## 2022-11-29 ENCOUNTER — Other Ambulatory Visit: Payer: Self-pay | Admitting: Internal Medicine

## 2023-01-14 ENCOUNTER — Other Ambulatory Visit: Payer: Self-pay | Admitting: Internal Medicine

## 2023-01-14 DIAGNOSIS — C50812 Malignant neoplasm of overlapping sites of left female breast: Secondary | ICD-10-CM

## 2023-01-14 DIAGNOSIS — I159 Secondary hypertension, unspecified: Secondary | ICD-10-CM

## 2023-05-07 IMAGING — CT CT ABD-PELV W/O CM
2 of 4 series · 12 of 36 positions shown, 15 images · non-contrast
Comparison: Multiple priors including most recent Dbui Cinta

CLINICAL DATA: Breast cancer, assess treatment response.

EXAM:
CT CHEST, ABDOMEN AND PELVIS WITHOUT CONTRAST
TECHNIQUE: Multidetector CT imaging of the chest, abdomen and pelvis was
performed following the standard protocol without IV contrast.

[Series 2: axial st · axial · 0.80mm/px · z∈[-1014,-514]mm · 9 of 120 slices shown, 12 images]
[im 10/120  mediastinal]
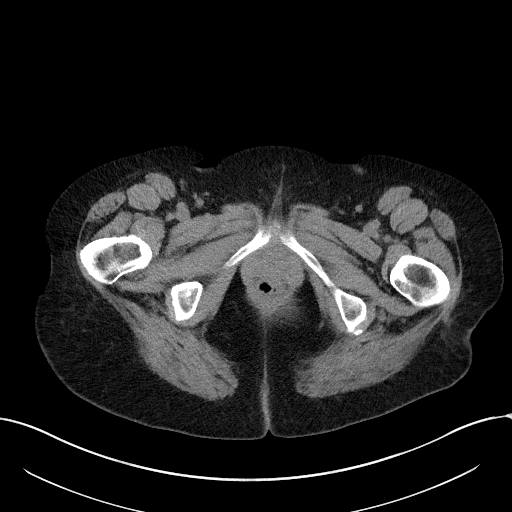
[im 10/120  lung]
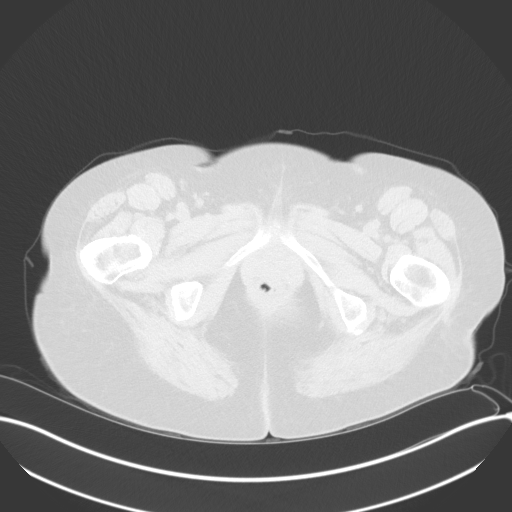
[im 28/120  lung]
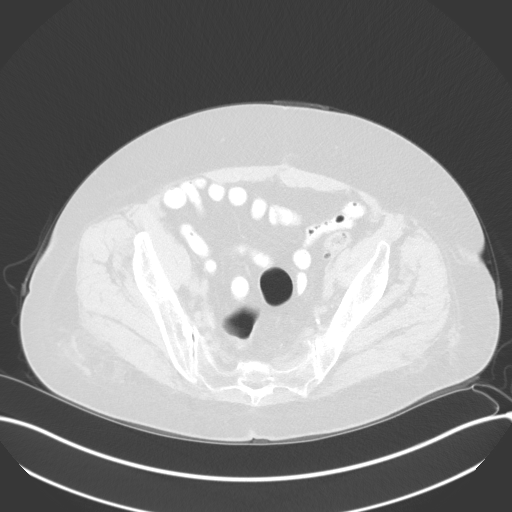
[im 37/120  lung]
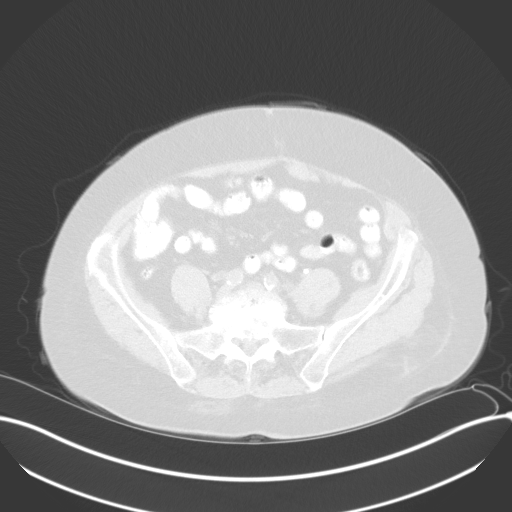
[im 46/120  lung]
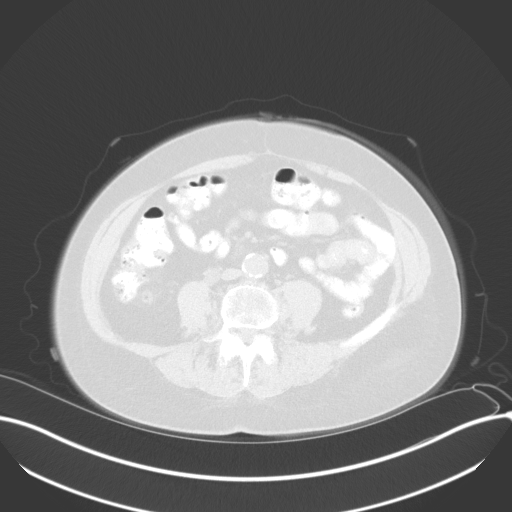
[im 65/120  mediastinal]
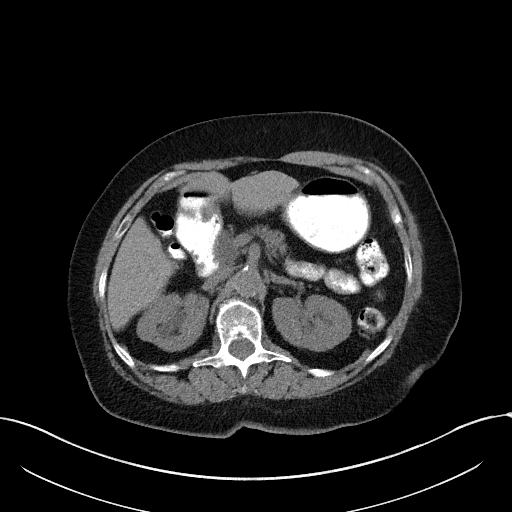
[im 65/120  lung]
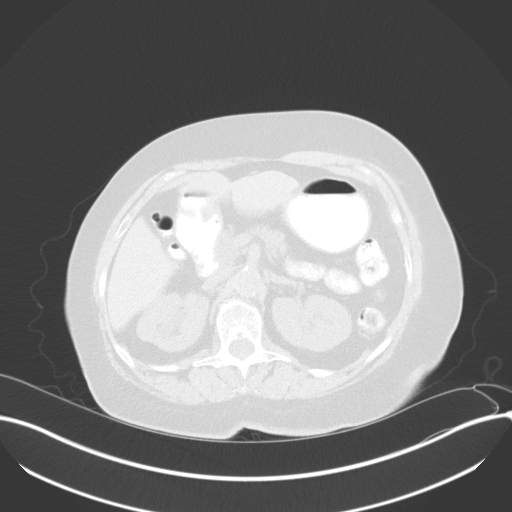
[im 74/120  lung]
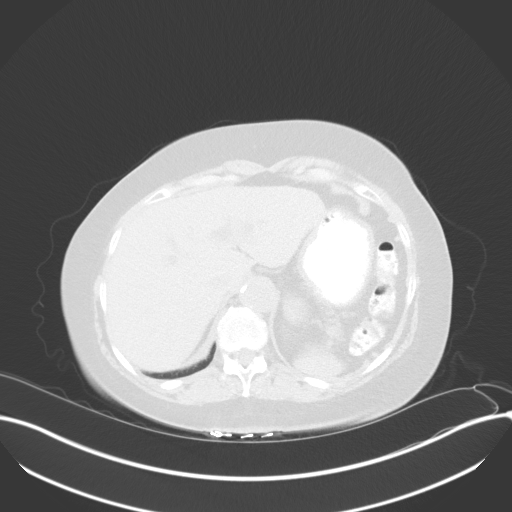
[im 83/120  lung]
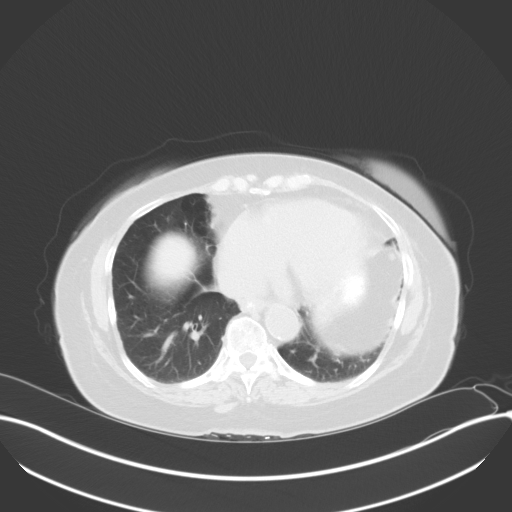
[im 101/120  lung]
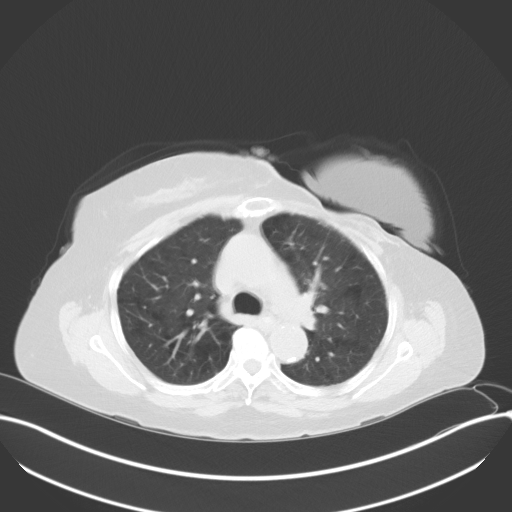
[im 110/120  mediastinal]
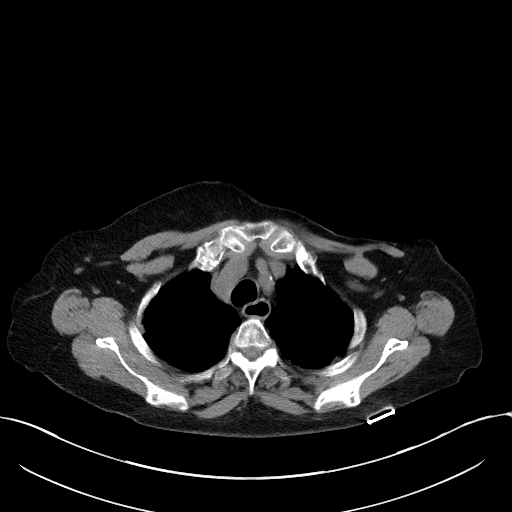
[im 110/120  lung]
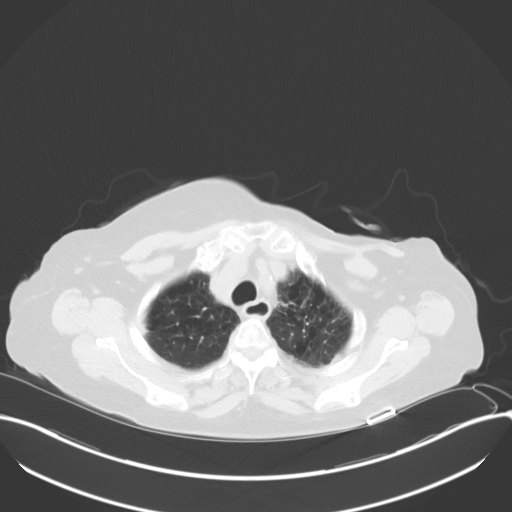

[Series 5: coronal · coronal · 0.68mm/px · 3 of 139 slices shown]
[im 28/139  lung]
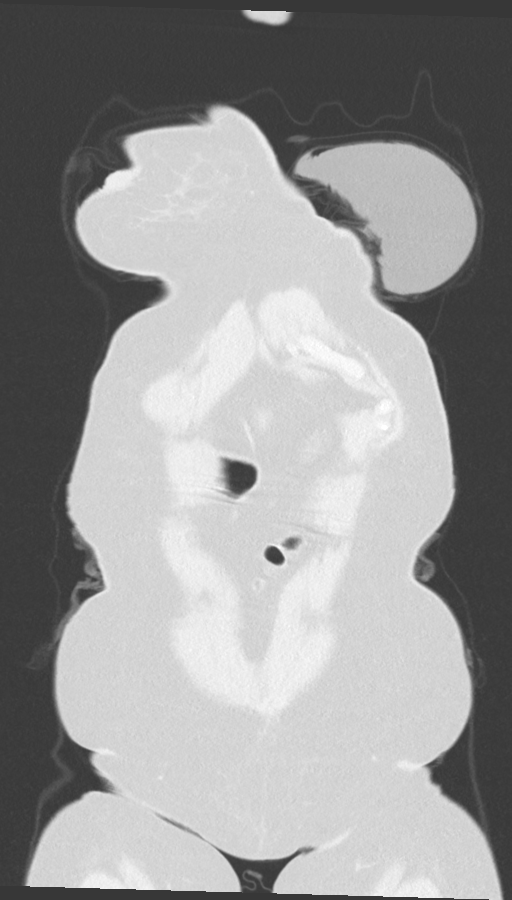
[im 56/139  lung]
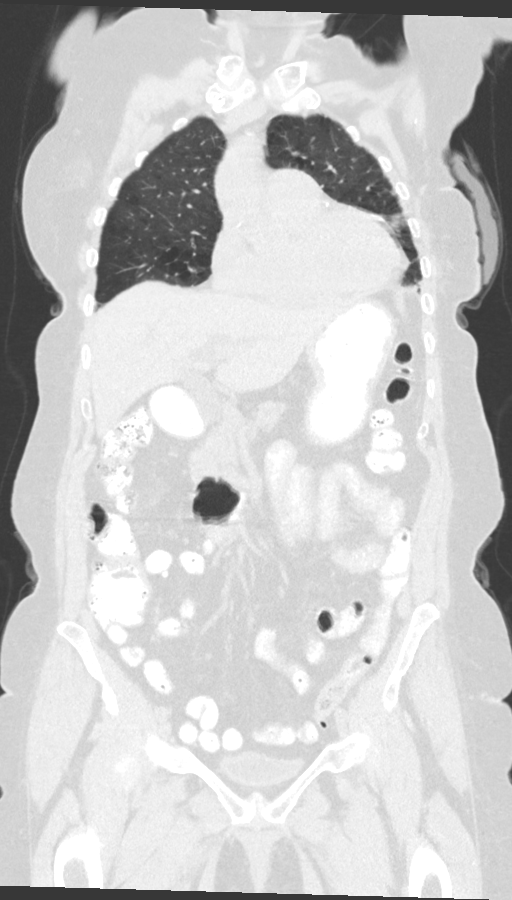
[im 83/139  lung]
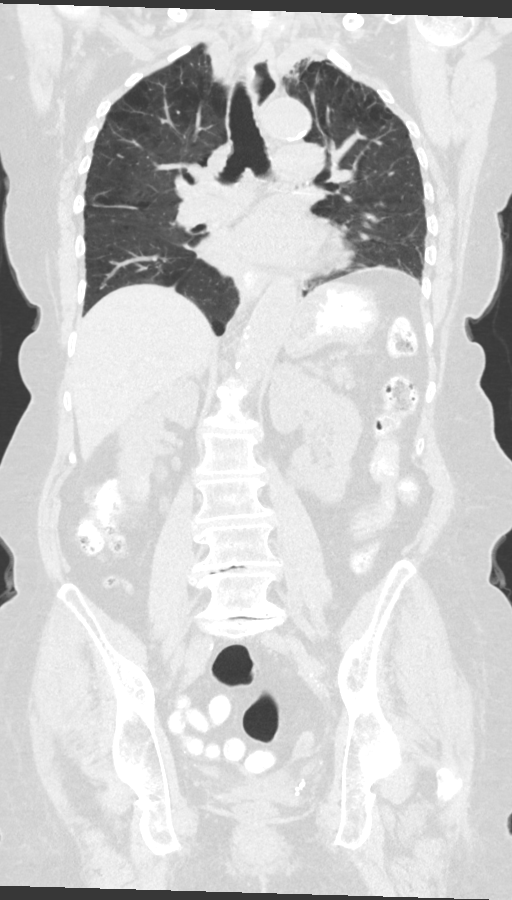

[12 of 36 positions shown; findings below may reference images not displayed]

FINDINGS: CT CHEST FINDINGS

Cardiovascular: Aortic and branch vessel atherosclerosis without
aneurysmal dilation. Coronary artery calcifications. Normal size
heart. No significant pericardial effusion/thickening.

Mediastinum/Nodes: No discrete thyroid nodularity. No pathologically
enlarged supraclavicular, axillary, mediastinal or hilar lymph nodes
visualized within the limitation of noncontrast examination. Prior
left axillary lymph node dissection. Ingested radiopaque contrast
visualized within the esophagus. Trachea is grossly unremarkable.

Lungs/Pleura: Stable post radiation change in the left lung apex. No
suspicious pulmonary nodules or masses. Centrilobular and paraseptal
emphysematous change. No pleural effusion. No airspace
consolidation. No pneumothorax.

Musculoskeletal: Postsurgical change of prior left mastectomy and
left axillary lymph node dissection. No suspicious chest wall
lesions visualized.

CT ABDOMEN PELVIS FINDINGS

Hepatobiliary: Unremarkable noncontrast appearance of the hepatic
parenchyma. Gallbladder surgically absent. Stable mild intra and
extrahepatic biliary ductal dilation, likely reservoir effect post
cholecystectomy.

Pancreas: Within normal limits.

Spleen: Within normal limits.

Adrenals/Urinary Tract: Adrenal glands are unremarkable. Partial
duplication of the right renal collecting system. Nonobstructive 2
mm right lower pole renal calculus. Heterogeneous appearance of the
right kidney is incompletely evaluated without intravenous contrast
material but appears similar to multiple prior examinations
including most recent PET-CT November 14, 2020, with none of these
areas demonstrating abnormal FDG uptake and possibly representing a
combination of fluid-filled and hemorrhagic/proteinaceous cysts.
Left lower pole renal cyst measuring 2.2 cm. Bladder is unremarkable
for degree of distension.

Stomach/Bowel: Radiopaque enteric contrast traverses the descending
colon. Stomach is distended with ingested contrast material without
suspicious wall thickening. Normal positioning of duodenum/ligament
of Treitz. No pathologic dilation of small bowel. The appendix is
grossly unremarkable. Terminal ileum is unremarkable. Left-sided
colonic diverticular disease without evidence of acute
diverticulitis.

Vascular/Lymphatic: Aortic atherosclerosis without aneurysmal
dilation. No pathologically enlarged abdominal or pelvic lymph
nodes.

Reproductive: Status post hysterectomy. No adnexal masses.

Other: No abdominopelvic ascites.

Musculoskeletal: Multilevel degenerative changes spine. Degenerative
changes bilateral hips and SI joints. No aggressive lytic or blastic
lesion of bone
IMPRESSION: 1. Stable examination status post left mastectomy with left axillary
lymph node dissection.
2. No evidence of recurrence or metastatic disease within the chest,
abdomen, or pelvis.
3. Left-sided colonic diverticular disease without evidence of acute
diverticulitis.
4. Aortic Atherosclerosis ([id number].S) and Emphysema (HDH64-C3W.2).

## 2023-06-01 NOTE — Telephone Encounter (Signed)
x

## 2023-09-17 IMAGING — CT CT CHEST-ABD-PELV W/ CM
2 of 5 series · 13 of 36 positions shown, 15 images · IV contrast (omnipaque)
Comparison: 01/07/2021

CLINICAL DATA: Restaging breast cancer. Initial diagnosis in 7779.
Recurrence in 4538.

EXAM:
CT CHEST, ABDOMEN, AND PELVIS WITH CONTRAST
TECHNIQUE: Multidetector CT imaging of the chest, abdomen and pelvis was
performed following the standard protocol during bolus
administration of intravenous contrast.
CONTRAST:  75mL OMNIPAQUE IOHEXOL 300 MG/ML  SOLN

[Series 2: axials cap 5.00 · axial · 0.65mm/px · z∈[-1448,-953]mm · 10 of 123 slices shown, 12 images]
[im 12/123  mediastinal]
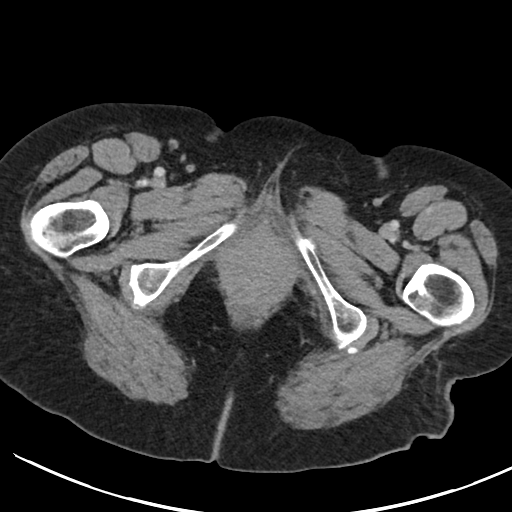
[im 12/123  bone]
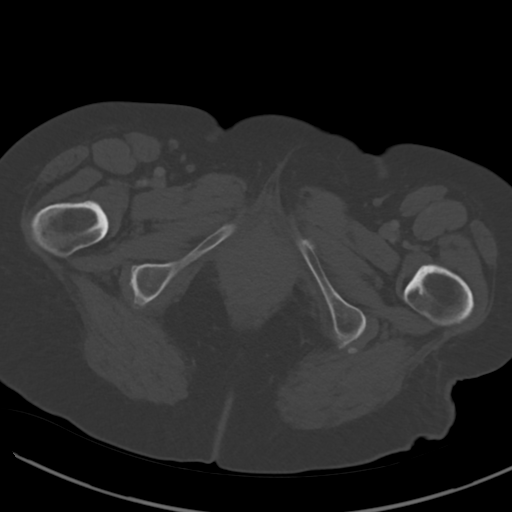
[im 23/123  mediastinal]
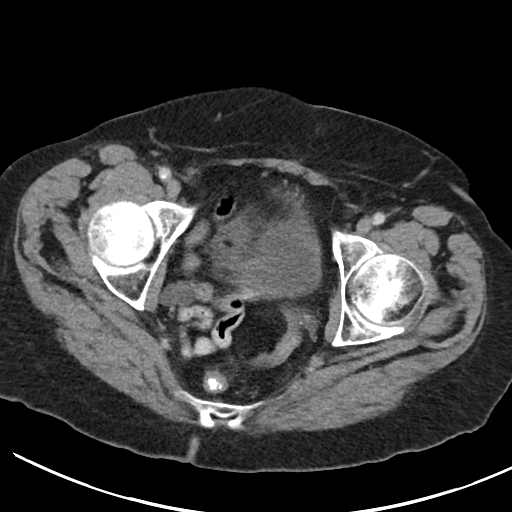
[im 34/123  mediastinal]
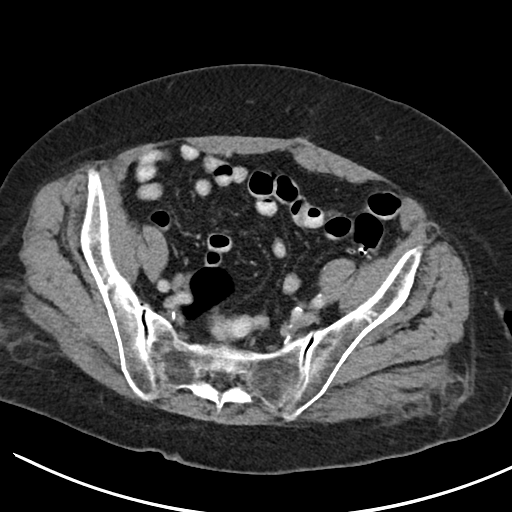
[im 45/123  mediastinal]
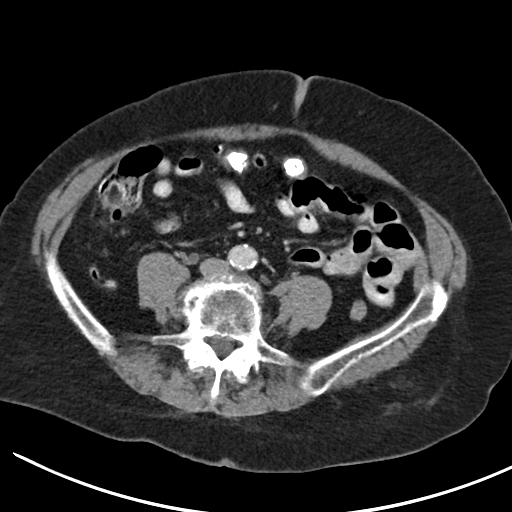
[im 56/123  mediastinal]
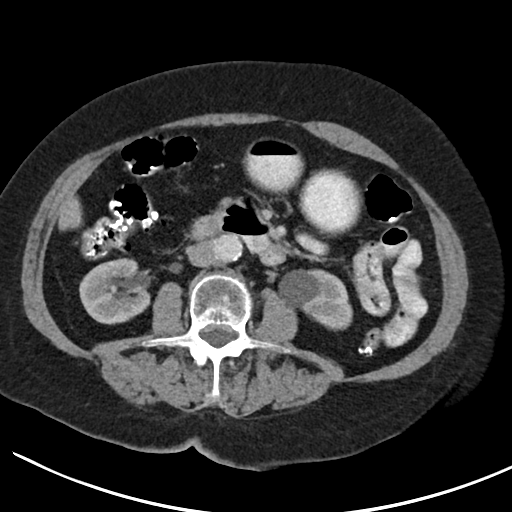
[im 67/123  mediastinal]
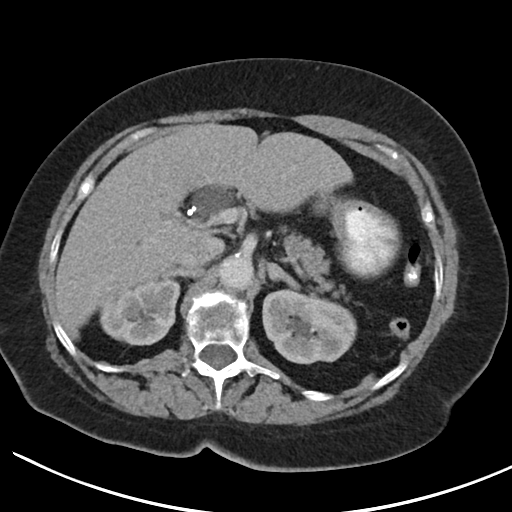
[im 78/123  mediastinal]
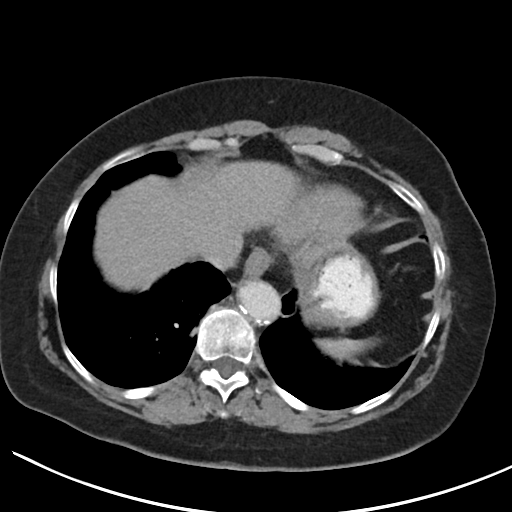
[im 89/123  mediastinal]
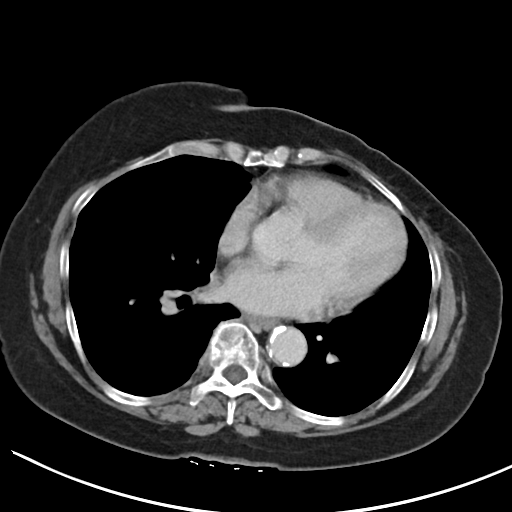
[im 100/123  mediastinal]
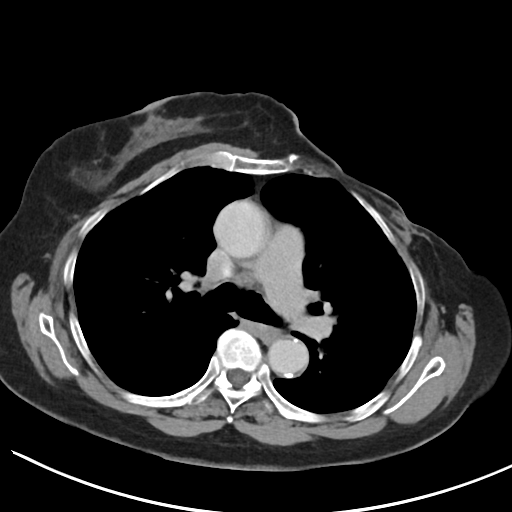
[im 100/123  bone]
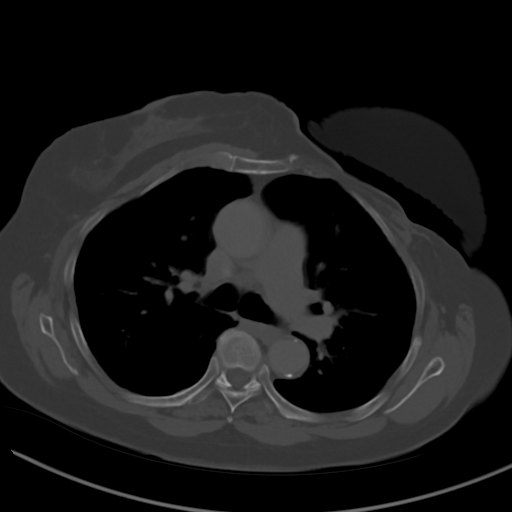
[im 111/123  mediastinal]
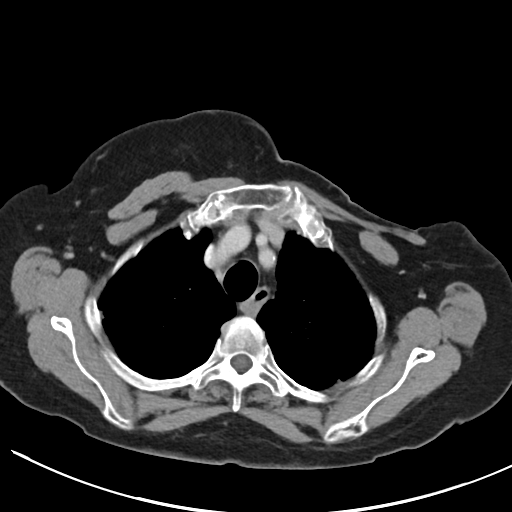

[Series 4: coronals cap 2.00 cor · coronal · 0.65mm/px · 3 of 138 slices shown]
[im 28/138  mediastinal]
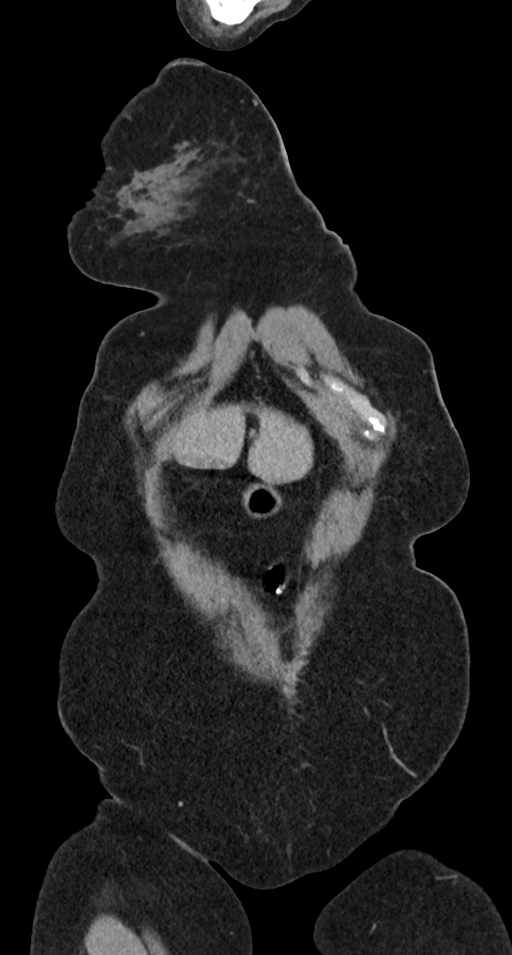
[im 55/138  mediastinal]
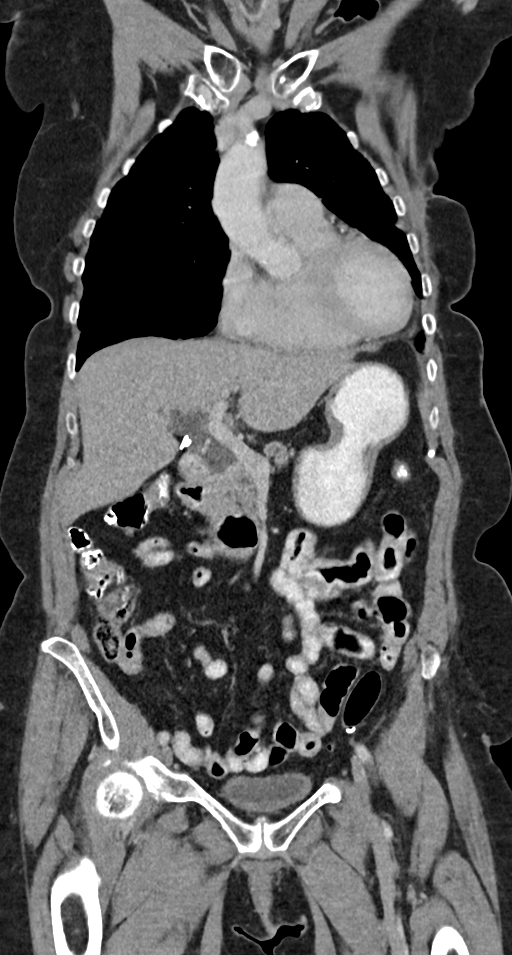
[im 83/138  mediastinal]
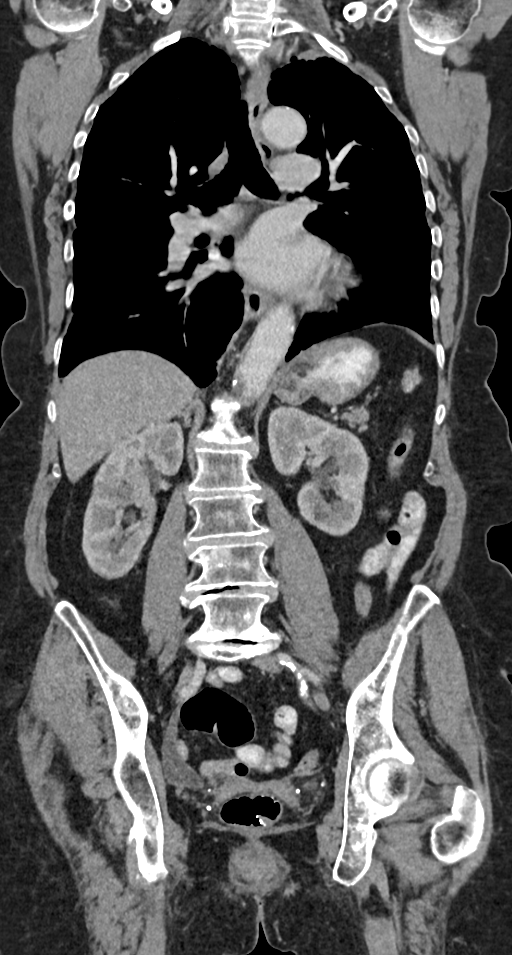

[13 of 36 positions shown; findings below may reference images not displayed]

FINDINGS: CT CHEST FINDINGS

Cardiovascular: The heart is normal in size per. No pericardial
effusion. Mild tortuosity and calcification of the thoracic aorta
but no aneurysm or dissection. Stable scattered three-vessel
coronary artery calcifications.

Mediastinum/Nodes: No mediastinal or hilar mass or lymphadenopathy.
Esophagus is grossly normal.

Lungs/Pleura: Stable underlying emphysematous changes. Moderate
breathing motion artifact but no findings suspicious for pulmonary
metastatic disease. Stable dense left apical pleural and parenchymal
scarring changes. Stable radiation changes involving the anterior
aspect of the left lung. Pleural nodularity noted mainly at the
right lung base. Some of these nodules have enlarged.

The largest area of pleural nodularity measures 17 mm adjacent to
the spine on image 122/3.

5 mm nodule on image 124/3

7 mm nodule on image 119/3.

A few scattered sclerotic rib lesions are also noted.

Musculoskeletal: Stable surgical changes from a left mastectomy. No
findings for chest wall recurrence. No left axillary or
supraclavicular adenopathy. There are new mixed lytic and sclerotic
bone lesions involving the thoracic lumbar spine worrisome for new
metastatic bone disease.

CT ABDOMEN PELVIS FINDINGS

Hepatobiliary: Stable intra and extrahepatic biliary dilatation
likely due to prior cholecystectomy. There is an ill-defined area of
low attenuation in the peripheral aspect of segment 4A (image 53/2)
which could represent a new liver metastasis.

Pancreas: No mass, inflammation or ductal dilatation.

Spleen: Normal size.  No focal lesions.

Adrenals/Urinary Tract: The adrenal glands are unremarkable. No
worrisome renal lesions. Stable lower pole left renal cyst. The
bladder is unremarkable.

Stomach/Bowel: The stomach, duodenum, small bowel and colon are
grossly normal. The terminal ileum and appendix are normal.

Vascular/Lymphatic: Stable atherosclerotic calcifications involving
the aorta and branch vessels but no aneurysm or dissection the major
venous structures are patent. No mesenteric or retroperitoneal mass
or adenopathy.

Reproductive: Surgically absent.

Other: No pelvic mass or adenopathy. No free pelvic fluid
collections. No inguinal mass or adenopathy. No abdominal wall
hernia or subcutaneous lesions.

Musculoskeletal: No definite pelvic metastatic bone lesions.
IMPRESSION: 1. New mixed lytic and sclerotic bone lesions involving the thoracic
and lumbar spine worrisome for new metastatic bone disease. Suspect
bilateral fifth rib lesions also.
2. Enlarging right-sided pleural/subpleural nodules worrisome for
metastatic disease.
3. Possible new liver lesion and segment 4A.
4. Recommend PET-CT for further evaluation.

Aortic Atherosclerosis ([id number]-[id number]) and Emphysema ([id number]).
